# Patient Record
Sex: Male | Born: 1965 | Race: White | Hispanic: No | Marital: Married | Smoking: Current every day smoker
Health system: Southern US, Community
[De-identification: ages and names within clinical notes are randomized; demographics above are authoritative.]

## PROBLEM LIST (undated history)

## (undated) DIAGNOSIS — F1010 Alcohol abuse, uncomplicated: Secondary | ICD-10-CM

## (undated) DIAGNOSIS — Z9189 Other specified personal risk factors, not elsewhere classified: Secondary | ICD-10-CM

## (undated) DIAGNOSIS — R0600 Dyspnea, unspecified: Secondary | ICD-10-CM

## (undated) DIAGNOSIS — Z720 Tobacco use: Secondary | ICD-10-CM

## (undated) DIAGNOSIS — F32A Depression, unspecified: Secondary | ICD-10-CM

## (undated) DIAGNOSIS — R569 Unspecified convulsions: Secondary | ICD-10-CM

## (undated) DIAGNOSIS — K219 Gastro-esophageal reflux disease without esophagitis: Secondary | ICD-10-CM

## (undated) DIAGNOSIS — F411 Generalized anxiety disorder: Secondary | ICD-10-CM

## (undated) DIAGNOSIS — M1990 Unspecified osteoarthritis, unspecified site: Secondary | ICD-10-CM

## (undated) DIAGNOSIS — I10 Essential (primary) hypertension: Secondary | ICD-10-CM

## (undated) DIAGNOSIS — M75102 Unspecified rotator cuff tear or rupture of left shoulder, not specified as traumatic: Secondary | ICD-10-CM

## (undated) DIAGNOSIS — F1910 Other psychoactive substance abuse, uncomplicated: Secondary | ICD-10-CM

## (undated) DIAGNOSIS — D649 Anemia, unspecified: Secondary | ICD-10-CM

## (undated) DIAGNOSIS — K5792 Diverticulitis of intestine, part unspecified, without perforation or abscess without bleeding: Secondary | ICD-10-CM

## (undated) DIAGNOSIS — E7800 Pure hypercholesterolemia, unspecified: Secondary | ICD-10-CM

## (undated) DIAGNOSIS — K5720 Diverticulitis of large intestine with perforation and abscess without bleeding: Secondary | ICD-10-CM

## (undated) DIAGNOSIS — G4730 Sleep apnea, unspecified: Secondary | ICD-10-CM

## (undated) DIAGNOSIS — T7840XA Allergy, unspecified, initial encounter: Secondary | ICD-10-CM

## (undated) DIAGNOSIS — S43432A Superior glenoid labrum lesion of left shoulder, initial encounter: Secondary | ICD-10-CM

## (undated) HISTORY — DX: Allergy, unspecified, initial encounter: T78.40XA

## (undated) HISTORY — DX: Essential (primary) hypertension: I10

## (undated) HISTORY — DX: Generalized anxiety disorder: F41.1

## (undated) HISTORY — DX: Other psychoactive substance abuse, uncomplicated: F19.10

## (undated) HISTORY — DX: Unspecified convulsions: R56.9

## (undated) HISTORY — DX: Depression, unspecified: F32.A

## (undated) HISTORY — PX: ROTATOR CUFF REPAIR: SHX139

## (undated) HISTORY — PX: HEMORRHOID SURGERY: SHX153

## (undated) HISTORY — DX: Unspecified osteoarthritis, unspecified site: M19.90

## (undated) HISTORY — PX: HERNIA REPAIR: SHX51

## (undated) HISTORY — PX: BACK SURGERY: SHX140

---

## 2002-02-21 ENCOUNTER — Other Ambulatory Visit: Admission: RE | Admit: 2002-02-21 | Discharge: 2002-02-21 | Payer: Self-pay | Admitting: Dermatology

## 2002-07-19 ENCOUNTER — Encounter: Payer: Self-pay | Admitting: Preventative Medicine

## 2002-07-19 ENCOUNTER — Ambulatory Visit (HOSPITAL_COMMUNITY): Admission: RE | Admit: 2002-07-19 | Discharge: 2002-07-19 | Payer: Self-pay | Admitting: Preventative Medicine

## 2002-09-05 ENCOUNTER — Ambulatory Visit (HOSPITAL_BASED_OUTPATIENT_CLINIC_OR_DEPARTMENT_OTHER): Admission: RE | Admit: 2002-09-05 | Discharge: 2002-09-05 | Payer: Self-pay | Admitting: Orthopedic Surgery

## 2003-07-10 ENCOUNTER — Encounter: Payer: Self-pay | Admitting: General Surgery

## 2003-07-11 ENCOUNTER — Ambulatory Visit (HOSPITAL_COMMUNITY): Admission: RE | Admit: 2003-07-11 | Discharge: 2003-07-11 | Payer: Self-pay | Admitting: General Surgery

## 2003-07-11 ENCOUNTER — Encounter (INDEPENDENT_AMBULATORY_CARE_PROVIDER_SITE_OTHER): Payer: Self-pay

## 2004-01-29 ENCOUNTER — Ambulatory Visit (HOSPITAL_COMMUNITY): Admission: RE | Admit: 2004-01-29 | Discharge: 2004-01-29 | Payer: Self-pay | Admitting: Pulmonary Disease

## 2004-03-13 ENCOUNTER — Encounter (HOSPITAL_COMMUNITY): Admission: RE | Admit: 2004-03-13 | Discharge: 2004-04-12 | Payer: Self-pay | Admitting: Neurosurgery

## 2006-12-25 ENCOUNTER — Ambulatory Visit: Payer: Self-pay | Admitting: Internal Medicine

## 2007-07-26 ENCOUNTER — Encounter: Payer: Self-pay | Admitting: Internal Medicine

## 2007-12-03 ENCOUNTER — Ambulatory Visit (HOSPITAL_COMMUNITY): Admission: RE | Admit: 2007-12-03 | Discharge: 2007-12-03 | Payer: Self-pay | Admitting: Pulmonary Disease

## 2008-08-16 ENCOUNTER — Ambulatory Visit (HOSPITAL_COMMUNITY): Admission: RE | Admit: 2008-08-16 | Discharge: 2008-08-16 | Payer: Self-pay | Admitting: Family Medicine

## 2008-11-09 ENCOUNTER — Ambulatory Visit (HOSPITAL_COMMUNITY): Admission: RE | Admit: 2008-11-09 | Discharge: 2008-11-09 | Payer: Self-pay | Admitting: Family Medicine

## 2009-03-07 ENCOUNTER — Encounter: Admission: RE | Admit: 2009-03-07 | Discharge: 2009-03-07 | Payer: Self-pay | Admitting: Occupational Medicine

## 2009-05-25 DIAGNOSIS — F411 Generalized anxiety disorder: Secondary | ICD-10-CM | POA: Insufficient documentation

## 2009-05-25 DIAGNOSIS — I10 Essential (primary) hypertension: Secondary | ICD-10-CM | POA: Insufficient documentation

## 2009-05-25 DIAGNOSIS — M1990 Unspecified osteoarthritis, unspecified site: Secondary | ICD-10-CM | POA: Insufficient documentation

## 2011-04-11 NOTE — Op Note (Signed)
   NAME:  [name], [name]                       ACCOUNT NO.:  [id number]   MEDICAL RECORD NO.:  [id number]                   PATIENT TYPE:  AMB   LOCATION:  DAY                                  FACILITY:  [facility]   PHYSICIAN:  Timothy E. Earlene Plater, M.D.              DATE OF BIRTH:  14-Jul-1966   DATE OF PROCEDURE:  07/11/2003  DATE OF DISCHARGE:                                 OPERATIVE REPORT   PREOPERATIVE DIAGNOSIS:  Internal and external hemorrhoids.   POSTOPERATIVE DIAGNOSIS:  Internal and external hemorrhoids.   PROCEDURE:  Hemorrhoidectomy, simple.   SURGEON:  Timothy E. Earlene Plater, M.D.   ANESTHESIA:  General.   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 Because of the pain,  discomfort, soilage and bleeding, he wishes to have surgical repair. It has  been carefully discussed with him in the office. Otherwise he is well and  has no contraindications to surgery. He was identified and a permit signed.   DESCRIPTION OF PROCEDURE:  He was taken to the operating room, placed  supine, LMA anesthesia provided. He was placed in lithotomy, perianal area  inspected, prepped and draped in the usual fashion. A large left posterior  external tag was present otherwise the external orifice and sphincters were  normal. The anus was injected around and about with 0.25% Marcaine with  epinephrine mixed 9:1 with Wydase. This was massaged in well. Anoscopy  carried out, a prominent left lateral and right posterior internal  hemorrhoid were present but only third degree and did not prolapse. I  excised the external tag and closed that wound with a running 4-0 Chromic. I  then via anoscopy band ligated a right posterior and a left lateral internal  hemorrhoid. Otherwise the anatomy was good and there were no indications for  further surgery. The procedure was complete, Gelfoam gauze and a dry sterile  dressing  applied. Counts correct. He tolerated it well and was removed to  the recovery room in good condition.   Written and verbal instructions were given including Percocet and he will be  seen and followed as an outpatient.                                               Timothy E. Earlene Plater, M.D.    TED/MEDQ  D:  07/11/2003  T:  07/11/2003  Job:  [id number]   cc:   Ramon Dredge L. Juanetta Gosling, M.D.  [address]  [place]  [place] [address]  Fax: [phone]
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
## 2011-04-11 NOTE — Assessment & Plan Note (Signed)
[facility]                       [place] CARDIOLOGY OFFICE NOTE   MARION, ROSENBERRY                    MRN:          [id number]  DATE:12/25/2006                            DOB:          Dec 27, 1965    [name] is a 45 year old gentleman who was referred for evaluation  of chest pressure.   HISTORY OF PRESENT ILLNESS:  The patient said for the past 2 years  actually he has had some tightness in his chest.  He points to  substernal region.  It lasts about 15 minutes, usually occurs when he is  driving.  Not associated with shortness of breath, no radiation.  Since  it began 2 years ago he says it comes on about every 2-3 days.   When he thinks about it, it does not occur with other activities, does  not wake him up at night, occurs more during the week than on the  weekend.   He is active at work, notes no problems with this, does not slow down  because he gives out, no chest pressure, takes stairs slow more because  of his back.   ALLERGIES:  None.   MEDICATIONS:  1. Atenolol 50 daily.  2. Benicar 20 daily.  3. Klonopin 5 daily.  4. Zoloft 50 daily.   PAST MEDICAL HISTORY:  1. Hypertension.  2. Anxiety.  3. Degenerative joint disease.   PAST SURGICAL HISTORY:  Hernia operation, rotator cuff surgery.   SOCIAL HISTORY:  The patient is married.  He recently began taking care  of a niece.  Smokes 1.5 packs per day, began smoking at age 86, does not  drink.   FAMILY HISTORY:  Father has coronary artery disease started in his 37s.  He is now 61.  Mother has coronary artery disease, question when  started.  Two siblings no CAD.  Maternal side of the family with CAD.   REVIEW OF SYSTEMS:  All systems reviewed.  Negative to the above problem  except as noted.   PHYSICAL EXAMINATION:  The patient is in no distress.  Blood pressure  138/82, pulse is 71, weight 187.  HEENT:  Normocephalic and atraumatic.  EOMI.  PERRL.  Throat  clear.  Nose clear.  NECK:  JVP is normal, no bruits, no thyromegaly.  LUNGS:  Clear, no rales or wheezes.  CARDIAC:  Regular rate and rhythm, S1, S2, no S3, S4 or murmurs.  CHEST:  Nontender.  ABDOMEN:  Benign.  No hepatomegaly, no masses.  No bruits.  EXTREMITIES:  Good distal pulses, no lower extremity edema.   Twelve-lead EKG:  Normal sinus rhythm, 71 BPM, nonspecific ST changes.   IMPRESSION:  [name] is a 45 year old gentleman with chest  pressure.  His risk factors include hypertension which is being treated,  family history of CAD, and also tobacco.   LABORATORY DATA:  He had lipids done just recently that actually were  quite good.  His LDL was 79, HDL 48.  His triglycerides  are 299, says  he drinks Gatorade and sodas.   I am not sure what this chest pressure represents.  There may be  some  increased stress, question of subclinical reflux.  I have given him  prescription for omeprazole to begin b.i.d. and he should really target  when he gets the spells.  I would like to see him back in a few weeks.   Again, if he has a severe spell he should call 911 and chew aspirin, 4  baby.   Otherwise, continue activities as tolerated.     Pricilla Riffle, MD, [facility]  Electronically Signed    PVR/MedQ  DD: 12/25/2006  DT: 12/25/2006  Job #: [id number]

## 2011-05-29 ENCOUNTER — Encounter: Payer: Self-pay | Admitting: Family Medicine

## 2011-05-29 DIAGNOSIS — T7840XA Allergy, unspecified, initial encounter: Secondary | ICD-10-CM | POA: Insufficient documentation

## 2011-05-29 DIAGNOSIS — I10 Essential (primary) hypertension: Secondary | ICD-10-CM | POA: Insufficient documentation

## 2012-12-14 ENCOUNTER — Ambulatory Visit (INDEPENDENT_AMBULATORY_CARE_PROVIDER_SITE_OTHER): Payer: PRIVATE HEALTH INSURANCE | Admitting: Urology

## 2012-12-14 DIAGNOSIS — N529 Male erectile dysfunction, unspecified: Secondary | ICD-10-CM

## 2012-12-14 DIAGNOSIS — Z3009 Encounter for other general counseling and advice on contraception: Secondary | ICD-10-CM

## 2013-01-04 ENCOUNTER — Encounter (INDEPENDENT_AMBULATORY_CARE_PROVIDER_SITE_OTHER): Payer: PRIVATE HEALTH INSURANCE | Admitting: Urology

## 2013-01-04 DIAGNOSIS — Z302 Encounter for sterilization: Secondary | ICD-10-CM

## 2013-01-11 ENCOUNTER — Ambulatory Visit (INDEPENDENT_AMBULATORY_CARE_PROVIDER_SITE_OTHER): Payer: PRIVATE HEALTH INSURANCE | Admitting: Urology

## 2013-01-11 DIAGNOSIS — Z09 Encounter for follow-up examination after completed treatment for conditions other than malignant neoplasm: Secondary | ICD-10-CM

## 2013-01-11 DIAGNOSIS — Z302 Encounter for sterilization: Secondary | ICD-10-CM

## 2013-02-11 ENCOUNTER — Emergency Department (HOSPITAL_COMMUNITY): Payer: PRIVATE HEALTH INSURANCE

## 2013-02-11 ENCOUNTER — Encounter (HOSPITAL_COMMUNITY): Payer: Self-pay | Admitting: Emergency Medicine

## 2013-02-11 ENCOUNTER — Observation Stay (HOSPITAL_COMMUNITY)
Admission: EM | Admit: 2013-02-11 | Discharge: 2013-02-13 | Disposition: A | Discharge status: Home / Self Care | Payer: PRIVATE HEALTH INSURANCE | Attending: Internal Medicine | Admitting: Internal Medicine

## 2013-02-11 DIAGNOSIS — E785 Hyperlipidemia, unspecified: Secondary | ICD-10-CM | POA: Diagnosis present

## 2013-02-11 DIAGNOSIS — Z720 Tobacco use: Secondary | ICD-10-CM | POA: Diagnosis present

## 2013-02-11 DIAGNOSIS — R0789 Other chest pain: Principal | ICD-10-CM | POA: Diagnosis present

## 2013-02-11 DIAGNOSIS — I519 Heart disease, unspecified: Secondary | ICD-10-CM | POA: Insufficient documentation

## 2013-02-11 DIAGNOSIS — R079 Chest pain, unspecified: Secondary | ICD-10-CM

## 2013-02-11 DIAGNOSIS — F411 Generalized anxiety disorder: Secondary | ICD-10-CM

## 2013-02-11 DIAGNOSIS — I10 Essential (primary) hypertension: Secondary | ICD-10-CM

## 2013-02-11 DIAGNOSIS — F17200 Nicotine dependence, unspecified, uncomplicated: Secondary | ICD-10-CM | POA: Insufficient documentation

## 2013-02-11 DIAGNOSIS — R61 Generalized hyperhidrosis: Secondary | ICD-10-CM | POA: Insufficient documentation

## 2013-02-11 DIAGNOSIS — Z8249 Family history of ischemic heart disease and other diseases of the circulatory system: Secondary | ICD-10-CM | POA: Insufficient documentation

## 2013-02-11 DIAGNOSIS — K219 Gastro-esophageal reflux disease without esophagitis: Secondary | ICD-10-CM | POA: Insufficient documentation

## 2013-02-11 LAB — BASIC METABOLIC PANEL
CO2: 25 mEq/L (ref 19–32)
Chloride: 100 mEq/L (ref 96–112)
Sodium: 137 mEq/L (ref 135–145)

## 2013-02-11 LAB — CBC
HCT: 48.3 % (ref 39.0–52.0)
Hemoglobin: 16.7 g/dL (ref 13.0–17.0)
MCH: 32.9 pg (ref 26.0–34.0)
MCHC: 34.6 g/dL (ref 30.0–36.0)

## 2013-02-11 LAB — CBC WITH DIFFERENTIAL/PLATELET
Basophils Absolute: 0.1 10*3/uL (ref 0.0–0.1)
HCT: 49.3 % (ref 39.0–52.0)
Lymphocytes Relative: 29 % (ref 12–46)
Lymphs Abs: 3.5 10*3/uL (ref 0.7–4.0)
Neutro Abs: 6.8 10*3/uL (ref 1.7–7.7)
Platelets: 292 10*3/uL (ref 150–400)
RBC: 5.19 MIL/uL (ref 4.22–5.81)
RDW: 12.8 % (ref 11.5–15.5)
WBC: 11.8 10*3/uL — ABNORMAL HIGH (ref 4.0–10.5)

## 2013-02-11 LAB — POCT I-STAT TROPONIN I: Troponin i, poc: 0 ng/mL (ref 0.00–0.08)

## 2013-02-11 LAB — CREATININE, SERUM: GFR calc non Af Amer: 59 mL/min — ABNORMAL LOW (ref >90)

## 2013-02-11 MED ORDER — AZITHROMYCIN 250 MG PO TABS
250.0000 mg | ORAL_TABLET | Freq: Once | ORAL | Status: AC
  Administered 2013-02-12: 250 mg via ORAL
  Filled 2013-02-11: qty 1

## 2013-02-11 MED ORDER — CLONAZEPAM 0.5 MG PO TABS
0.5000 mg | ORAL_TABLET | Freq: Every day | ORAL | Status: DC
  Administered 2013-02-12 – 2013-02-13 (×2): 0.5 mg via ORAL
  Filled 2013-02-11 (×2): qty 1

## 2013-02-11 MED ORDER — ONDANSETRON HCL 4 MG PO TABS: 4.0000 mg | ORAL_TABLET | Freq: Four times a day (QID) | ORAL | Status: DC | PRN

## 2013-02-11 MED ORDER — SIMVASTATIN 40 MG PO TABS
40.0000 mg | ORAL_TABLET | Freq: Every day | ORAL | Status: DC
  Administered 2013-02-12: 40 mg via ORAL
  Filled 2013-02-11 (×2): qty 1

## 2013-02-11 MED ORDER — VENLAFAXINE HCL ER 150 MG PO CP24
150.0000 mg | ORAL_CAPSULE | Freq: Every morning | ORAL | Status: DC
  Administered 2013-02-12 – 2013-02-13 (×2): 150 mg via ORAL
  Filled 2013-02-11 (×2): qty 1

## 2013-02-11 MED ORDER — ATENOLOL 50 MG PO TABS
50.0000 mg | ORAL_TABLET | Freq: Every morning | ORAL | Status: DC
  Administered 2013-02-12 – 2013-02-13 (×2): 50 mg via ORAL
  Filled 2013-02-11 (×2): qty 1

## 2013-02-11 MED ORDER — IRBESARTAN 75 MG PO TABS
75.0000 mg | ORAL_TABLET | Freq: Every day | ORAL | Status: DC
  Administered 2013-02-12 – 2013-02-13 (×2): 75 mg via ORAL
  Filled 2013-02-11 (×2): qty 1

## 2013-02-11 MED ORDER — ONDANSETRON HCL 4 MG/2ML IJ SOLN: 4.0000 mg | Freq: Four times a day (QID) | INTRAMUSCULAR | Status: DC | PRN

## 2013-02-11 MED ORDER — ENOXAPARIN SODIUM 40 MG/0.4ML ~~LOC~~ SOLN
40.0000 mg | SUBCUTANEOUS | Status: DC
  Filled 2013-02-11 (×3): qty 0.4

## 2013-02-11 MED ORDER — ASPIRIN 81 MG PO CHEW
324.0000 mg | CHEWABLE_TABLET | Freq: Once | ORAL | Status: AC
  Administered 2013-02-11: 324 mg via ORAL
  Filled 2013-02-11: qty 4

## 2013-02-11 MED ORDER — SODIUM CHLORIDE 0.9 % IJ SOLN
3.0000 mL | Freq: Two times a day (BID) | INTRAMUSCULAR | Status: DC
  Administered 2013-02-11 – 2013-02-12 (×3): 3 mL via INTRAVENOUS

## 2013-02-11 NOTE — ED Provider Notes (Signed)
History     CSN: [id number]  Arrival date & time 02/11/13  1255   First MD Initiated Contact with Patient 02/11/13 1635      Chief Complaint  Patient presents with  . Chest Pain    (Consider location/radiation/quality/duration/timing/severity/associated sxs/prior treatment) HPI Comments: Patient presents with a two-day history of intermittent chest tightness. He describes as a pressure feeling to the left side of his chest. It's been going off and on since yesterday. It's nonexertional. He has some radiation to his left shoulder and jaw at times. He did get diaphoretic and nauseated with the pain it first started. He has no cough or chest congestion. He has no leg pain or swelling. No history of heart problems in the past. He does have a history of hypertension and hyperlipidemia and he is a smoker. He has a family history of heart disease mostly in later years. Does have an aunt that had early heart disease in her 28s. His last stress test was several years ago.   Past Medical History  Diagnosis Date  . Allergy   . Hypertension     Past Surgical History  Procedure Laterality Date  . Rotator cuff repair    . Hernia repair    . Hemorrhoid surgery      No family history on file.  History  Substance Use Topics  . Smoking status: Current Every Day Smoker  . Smokeless tobacco: Not on file  . Alcohol Use: Yes      Review of Systems  Constitutional: Positive for diaphoresis. Negative for fever, chills and fatigue.  HENT: Negative for congestion, rhinorrhea and sneezing.   Eyes: Negative.   Respiratory: Negative for cough, chest tightness and shortness of breath.   Cardiovascular: Positive for chest pain. Negative for leg swelling.  Gastrointestinal: Positive for nausea. Negative for vomiting, abdominal pain, diarrhea and blood in stool.  Genitourinary: Negative for frequency, hematuria, flank pain and difficulty urinating.  Musculoskeletal: Negative for back pain and  arthralgias.  Skin: Negative for rash.  Neurological: Negative for dizziness, speech difficulty, weakness, numbness and headaches.    Allergies  Review of patient's allergies indicates no known allergies.  Home Medications   Current Outpatient Rx  Name  Route  Sig  Dispense  Refill  . Aspirin-Salicylamide-Caffeine (BC HEADACHE POWDER PO)   Oral   Take 1 packet by mouth daily as needed. For pain         . atenolol (TENORMIN) 50 MG tablet   Oral   Take 50 mg by mouth every morning.         . clonazePAM (KLONOPIN) 0.5 MG tablet   Oral   Take 0.5 mg by mouth daily.          Marland Kitchen NASAL SALINE NA   Nasal   Place 1 spray into the nose daily as needed. For nose bleeds         . olmesartan (BENICAR) 20 MG tablet   Oral   Take 20 mg by mouth every morning.          . simvastatin (ZOCOR) 40 MG tablet   Oral   Take 40 mg by mouth every morning.          . venlafaxine XR (EFFEXOR-XR) 150 MG 24 hr capsule   Oral   Take 150 mg by mouth every morning.           BP 121/78  Pulse 62  Temp(Src) 98.1 F (36.7 C) (Oral)  Resp 18  Ht 5\' 9"  (1.753 m)  Wt 188 lb (85.276 kg)  BMI 27.75 kg/m2  SpO2 98%  Physical Exam  Constitutional: He is oriented to person, place, and time. He appears well-developed and well-nourished.  HENT:  Head: Normocephalic and atraumatic.  Eyes: Pupils are equal, round, and reactive to light.  Neck: Normal range of motion. Neck supple.  Cardiovascular: Normal rate, regular rhythm and normal heart sounds.   Pulmonary/Chest: Effort normal and breath sounds normal. No respiratory distress. He has no wheezes. He has no rales. He exhibits no tenderness.  Abdominal: Soft. Bowel sounds are normal. There is no tenderness. There is no rebound and no guarding.  Musculoskeletal: Normal range of motion. He exhibits no edema.  Lymphadenopathy:    He has no cervical adenopathy.  Neurological: He is alert and oriented to person, place, and time.  Skin:  Skin is warm and dry. No rash noted.  Psychiatric: He has a normal mood and affect.    ED Course  Procedures (including critical care time)  Results for orders placed during the hospital encounter of 02/11/13  CBC WITH DIFFERENTIAL      Result Value Range   WBC 11.8 (*) 4.0 - 10.5 K/uL   RBC 5.19  4.22 - 5.81 MIL/uL   Hemoglobin 17.4 (*) 13.0 - 17.0 g/dL   HCT 16.1  09.6 - 04.5 %   MCV 95.0  78.0 - 100.0 fL   MCH 33.5  26.0 - 34.0 pg   MCHC 35.3  30.0 - 36.0 g/dL   RDW 40.9  81.1 - 91.4 %   Platelets 292  150 - 400 K/uL   Neutrophils Relative 58  43 - 77 %   Neutro Abs 6.8  1.7 - 7.7 K/uL   Lymphocytes Relative 29  12 - 46 %   Lymphs Abs 3.5  0.7 - 4.0 K/uL   Monocytes Relative 11  3 - 12 %   Monocytes Absolute 1.4 (*) 0.1 - 1.0 K/uL   Eosinophils Relative 1  0 - 5 %   Eosinophils Absolute 0.2  0.0 - 0.7 K/uL   Basophils Relative 0  0 - 1 %   Basophils Absolute 0.1  0.0 - 0.1 K/uL  BASIC METABOLIC PANEL      Result Value Range   Sodium 137  135 - 145 mEq/L   Potassium 4.6  3.5 - 5.1 mEq/L   Chloride 100  96 - 112 mEq/L   CO2 25  19 - 32 mEq/L   Glucose, Bld 115 (*) 70 - 99 mg/dL   BUN 13  6 - 23 mg/dL   Creatinine, Ser 7.82  0.50 - 1.35 mg/dL   Calcium 95.6  8.4 - 21.3 mg/dL   GFR calc non Af Amer 81 (*) >90 mL/min   GFR calc Af Amer >90  >90 mL/min  POCT I-STAT TROPONIN I      Result Value Range   Troponin i, poc 0.00  0.00 - 0.08 ng/mL   Comment 3            Dg Chest 2 View  02/11/2013  *RADIOLOGY REPORT*  Clinical Data: Chest pain.  CHEST - 2 VIEW  Comparison: No priors.  Findings: Lung volumes are normal.  No consolidative airspace disease.  No pleural effusions.  No pneumothorax.  No pulmonary nodule or mass noted.  Pulmonary vasculature and the cardiomediastinal silhouette are within normal limits.  IMPRESSION: 1. No radiographic evidence of acute cardiopulmonary disease.   Original Report Authenticated  By: Trudie Reed, M.D.      Dg Chest 2  View  02/11/2013  *RADIOLOGY REPORT*  Clinical Data: Chest pain.  CHEST - 2 VIEW  Comparison: No priors.  Findings: Lung volumes are normal.  No consolidative airspace disease.  No pleural effusions.  No pneumothorax.  No pulmonary nodule or mass noted.  Pulmonary vasculature and the cardiomediastinal silhouette are within normal limits.  IMPRESSION: 1. No radiographic evidence of acute cardiopulmonary disease.   Original Report Authenticated By: Trudie Reed, M.D.     Date: 02/11/2013  Rate: 78  Rhythm: normal sinus rhythm  QRS Axis: normal  Intervals: normal  ST/T Wave abnormalities: nonspecific ST/T changes  Conduction Disutrbances:none  Narrative Interpretation:   Old EKG Reviewed: unchanged    1. Chest pain       MDM  Patient is pain-free now. He was given aspirin. I discussed the case with the hospital service who will patient.        Rolan Bucco, MD 02/11/13 [phone]

## 2013-02-11 NOTE — ED Notes (Addendum)
Pt started zpak antibiotics Tuesday and has had epitaxis and dark stools since Saturday.

## 2013-02-11 NOTE — ED Notes (Addendum)
Onset of chest pain intermittent for years states seen Primary Doctor 4 days ago for sinus infections and given antibiotics. States one day ago chest pain returned with epistaxis and dark loose stool. Currently denies chest pain now.
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
## 2013-02-11 NOTE — ED Notes (Signed)
Hospitalist at bedside 

## 2013-02-12 ENCOUNTER — Observation Stay (HOSPITAL_COMMUNITY): Payer: PRIVATE HEALTH INSURANCE

## 2013-02-12 DIAGNOSIS — I517 Cardiomegaly: Secondary | ICD-10-CM

## 2013-02-12 DIAGNOSIS — F411 Generalized anxiety disorder: Secondary | ICD-10-CM

## 2013-02-12 LAB — CBC
HCT: 48.1 % (ref 39.0–52.0)
Hemoglobin: 16.6 g/dL (ref 13.0–17.0)
MCH: 33.4 pg (ref 26.0–34.0)
MCHC: 34.5 g/dL (ref 30.0–36.0)
MCV: 96.8 fL (ref 78.0–100.0)

## 2013-02-12 LAB — BASIC METABOLIC PANEL
BUN: 20 mg/dL (ref 6–23)
BUN: 20 mg/dL (ref 6–23)
CO2: 31 mEq/L (ref 19–32)
Calcium: 9.7 mg/dL (ref 8.4–10.5)
Creatinine, Ser: 0.98 mg/dL (ref 0.50–1.35)
GFR calc non Af Amer: 70 mL/min — ABNORMAL LOW (ref >90)
GFR calc non Af Amer: >90 mL/min (ref >90)
Glucose, Bld: 100 mg/dL — ABNORMAL HIGH (ref 70–99)
Glucose, Bld: 123 mg/dL — ABNORMAL HIGH (ref 70–99)
Potassium: 5.6 mEq/L — ABNORMAL HIGH (ref 3.5–5.1)
Sodium: 138 mEq/L (ref 135–145)

## 2013-02-12 LAB — URINALYSIS, ROUTINE W REFLEX MICROSCOPIC
Bilirubin Urine: NEGATIVE
Ketones, ur: NEGATIVE mg/dL
Nitrite: NEGATIVE
pH: 5 (ref 5.0–8.0)

## 2013-02-12 LAB — TROPONIN I
Troponin I: <0.3 ng/mL (ref <0.30)
Troponin I: <0.3 ng/mL (ref <0.30)

## 2013-02-12 LAB — HEMOGLOBIN A1C
Hgb A1c MFr Bld: 5.9 % — ABNORMAL HIGH (ref <5.7)
Mean Plasma Glucose: 123 mg/dL — ABNORMAL HIGH (ref <117)

## 2013-02-12 MED ORDER — ACETAMINOPHEN 325 MG PO TABS
650.0000 mg | ORAL_TABLET | ORAL | Status: DC | PRN
  Administered 2013-02-12: 650 mg via ORAL
  Filled 2013-02-12: qty 2

## 2013-02-12 MED ORDER — NICOTINE 21 MG/24HR TD PT24
21.0000 mg | MEDICATED_PATCH | Freq: Every day | TRANSDERMAL | Status: DC
  Administered 2013-02-12 – 2013-02-13 (×2): 21 mg via TRANSDERMAL
  Filled 2013-02-12 (×2): qty 1

## 2013-02-12 MED ORDER — SODIUM POLYSTYRENE SULFONATE 15 GM/60ML PO SUSP
15.0000 g | Freq: Once | ORAL | Status: AC
  Administered 2013-02-12: 15 g via ORAL
  Filled 2013-02-12: qty 60

## 2013-02-12 MED ORDER — TRAMADOL HCL 50 MG PO TABS
50.0000 mg | ORAL_TABLET | Freq: Four times a day (QID) | ORAL | Status: DC | PRN
  Administered 2013-02-12: 50 mg via ORAL
  Filled 2013-02-12: qty 1

## 2013-02-12 NOTE — Progress Notes (Signed)
  Echocardiogram 2D Echocardiogram has been performed.  [name] 02/12/2013, 9:30 AM

## 2013-02-12 NOTE — Progress Notes (Signed)
UR Completed Denisa Enterline Graves-Bigelow, RN,BSN [phone]  

## 2013-02-12 NOTE — Progress Notes (Signed)
[facility] PROGRESS NOTE  RHONIN TROTT ZOX:[id number] DOB: 10-20-1966 DOA: 02/11/2013 PCP: Leo Grosser, MD  Assessment/Plan: 1. Chest pain: admitt to tele to rule out ACS.  Cardiac enzymes negative Echo, and if negative, stress test in am.  Start on aspirin and b blockers.  2. Hypertension: controlled resume home medications.  3. Hyperlipidemia: obtain lipid panel in am  DVT PROPHYLAXIS.    HPI/Subjective: No chest pain, sob,  Objective: Filed Vitals:   02/12/13 0938 02/12/13 1311 02/12/13 1443 02/12/13 1500  BP: 135/83 127/85 135/82 126/80  Pulse: 62 69 64 60  Temp:   97.8 F (36.6 C)   TempSrc:   Oral   Resp:   18 16  Height:      Weight:      SpO2:   98%     Intake/Output Summary (Last 24 hours) at 02/12/13 1858 Last data filed at 02/12/13 0549  Gross per 24 hour  Intake      0 ml  Output    200 ml  Net   -200 ml   Filed Weights   02/11/13 1308 02/12/13 0541  Weight: 85.276 kg (188 lb) 85.004 kg (187 lb 6.4 oz)    Exam:   General:  Alert afebrile comfortable  Cardiovascular: s1s2 RRR  Respiratory: CTAB  Abdomen: soft NT ND BS+  Musculoskeletal: no pedal edema  Data Reviewed: Basic Metabolic Panel:  Recent Labs Lab 02/11/13 1311 02/11/13 1914 02/12/13 0454 02/12/13 1025  NA 137  --  139 138  K 4.6  --  5.6* 4.1  CL 100  --  99 100  CO2 25  --  31 28  GLUCOSE 115*  --  100* 123*  BUN 13  --  20 20  CREATININE 1.06 1.39* 1.20 0.98  CALCIUM 10.0  --  9.8 9.7   Liver Function Tests: No results found for this basename: AST, ALT, ALKPHOS, BILITOT, PROT, ALBUMIN,  in the last 168 hours No results found for this basename: LIPASE, AMYLASE,  in the last 168 hours No results found for this basename: AMMONIA,  in the last 168 hours CBC:  Recent Labs Lab 02/11/13 1311 02/11/13 1914 02/12/13 0454  WBC 11.8* 12.8* 10.6*  NEUTROABS 6.8  --   --   HGB 17.4* 16.7 16.6  HCT 49.3 48.3 48.1  MCV 95.0 95.3 96.8  PLT 292 269 250    Cardiac Enzymes:  Recent Labs Lab 02/11/13 1913 02/12/13 0045 02/12/13 0454  TROPONINI <0.30 <0.30 <0.30   BNP (last 3 results)  Recent Labs  02/11/13 1913  PROBNP 22.8   CBG: No results found for this basename: GLUCAP,  in the last 168 hours  No results found for this or any previous visit (from the past 240 hour(s)).   Studies: Dg Chest 2 View  02/11/2013  *RADIOLOGY REPORT*  Clinical Data: Chest pain.  CHEST - 2 VIEW  Comparison: No priors.  Findings: Lung volumes are normal.  No consolidative airspace disease.  No pleural effusions.  No pneumothorax.  No pulmonary nodule or mass noted.  Pulmonary vasculature and the cardiomediastinal silhouette are within normal limits.  IMPRESSION: 1. No radiographic evidence of acute cardiopulmonary disease.   Original Report Authenticated By: Trudie Reed, M.D.     Scheduled Meds: . atenolol  50 mg Oral q morning - 10a  . clonazePAM  0.5 mg Oral Daily  . enoxaparin (LOVENOX) injection  40 mg Subcutaneous Q24H  . irbesartan  75 mg Oral Daily  .  nicotine  21 mg Transdermal Daily  . simvastatin  40 mg Oral q1800  . sodium chloride  3 mL Intravenous Q12H  . venlafaxine XR  150 mg Oral q morning - 10a   Continuous Infusions:   Active Problems:   * No active hospital problems. Kathlen Mody  [facility] Pager [phone] If 7PM-7AM, please contact night-coverage at [email or web address], password [facility] 02/12/2013, 6:58 PM  LOS: 1 day

## 2013-02-13 DIAGNOSIS — R0789 Other chest pain: Secondary | ICD-10-CM | POA: Diagnosis present

## 2013-02-13 DIAGNOSIS — Z720 Tobacco use: Secondary | ICD-10-CM | POA: Diagnosis present

## 2013-02-13 DIAGNOSIS — E785 Hyperlipidemia, unspecified: Secondary | ICD-10-CM | POA: Diagnosis present

## 2013-02-13 DIAGNOSIS — F17200 Nicotine dependence, unspecified, uncomplicated: Secondary | ICD-10-CM

## 2013-02-13 DIAGNOSIS — R079 Chest pain, unspecified: Secondary | ICD-10-CM

## 2013-02-13 MED ORDER — TECHNETIUM TC 99M SESTAMIBI GENERIC - CARDIOLITE
10.0000 | Freq: Once | INTRAVENOUS | Status: AC | PRN
  Administered 2013-02-12: 10 via INTRAVENOUS

## 2013-02-13 MED ORDER — REGADENOSON 0.4 MG/5ML IV SOLN: 0.4000 mg | Freq: Once | INTRAVENOUS | Status: AC

## 2013-02-13 MED ORDER — REGADENOSON 0.4 MG/5ML IV SOLN
INTRAVENOUS | Status: AC
  Administered 2013-02-13: 0.4 mg via INTRAVENOUS
  Filled 2013-02-13: qty 5

## 2013-02-13 MED ORDER — TECHNETIUM TC 99M SESTAMIBI GENERIC - CARDIOLITE
30.0000 | Freq: Once | INTRAVENOUS | Status: AC | PRN
  Administered 2013-02-13 (×2): 30 via INTRAVENOUS

## 2013-02-13 MED ORDER — NICOTINE 21 MG/24HR TD PT24: 1.0000 | MEDICATED_PATCH | Freq: Every day | TRANSDERMAL | Status: DC

## 2013-02-13 MED ORDER — PANTOPRAZOLE SODIUM 40 MG PO TBEC: 40.0000 mg | DELAYED_RELEASE_TABLET | Freq: Every day | ORAL | Status: DC

## 2013-02-13 NOTE — Discharge Summary (Addendum)
Physician Discharge Summary  [name]:[id number] DOB: [birth year] DOA: 02/11/2013  PCP: Leo Grosser, MD  Admit date: 02/11/2013 Discharge date: 02/13/2013    Recommendations for Outpatient Follow-up:  1. Follow up with PCP in one week 2. Possible referral for GI to evaluate for pud.   Discharge Diagnoses:  Atypical chest pain gerd Tobacco abuse Hypertension Hyperlipidemia    Discharge Condition: fair.   Diet recommendation: low sodium diet  Filed Weights   02/11/13 1308 02/12/13 0541  Weight: 85.276 kg (188 lb) 85.004 kg (187 lb 6.4 oz)    History of present illness:  [name] is a 47 y.o. male With h/o hypertension, hyperlipidemia, came infor chest pain on the left side radiating to the jaw, occurred at rest resolved spontaneously. Associated with diaphoresis and nausea. No other complaints. He will be admitted to medical service for evaluation of ACS.    Hospital Course:  1. NON CARDIAC Chest pain: Probably from gerd / anxiety attacks.  admitted to tele to rule out ACS.  Cardiac enzymes negative , ekg normal sinus rhythm, echo showed normal EF. Stress test negative for inducible ischemia. Started him on protonix empirically for GERD.   2. Hypertension: controlled resume home medications.  3. Hyperlipidemia: RESUME zocor.  4. Tobacco abuse; nicotine patch ordered. Counseling provided. But patient and wife not ready to quit smoking yet.   Procedures: lexiscan Negative for inducible ischemia. Normal EF.  Discharge Exam: Filed Vitals:   02/13/13 9562 02/13/13 1000 02/13/13 1002 02/13/13 1004  BP: 117/74 141/87 144/83 131/82  Pulse:      Temp:      TempSrc:      Resp:      Height:      Weight:      SpO2:       General: Alert afebrile comfortable  Cardiovascular: s1s2 RRR  Respiratory: CTAB  Abdomen: soft NT ND BS+  Musculoskeletal: no pedal edema   Discharge Instructions  Discharge Orders   Future Orders Complete By Expires     Diet - low sodium heart healthy  As directed     Discharge instructions  As directed     Comments:      Follow up with PCP in 1 week.        Medication List    TAKE these medications       atenolol 50 MG tablet  Commonly known as:  TENORMIN  Take 50 mg by mouth every morning.     BC HEADACHE POWDER PO  Take 1 packet by mouth daily as needed. For pain     KLONOPIN 0.5 MG tablet  Generic drug:  clonazePAM  Take 0.5 mg by mouth daily.     NASAL SALINE NA  Place 1 spray into the nose daily as needed. For nose bleeds     nicotine 21 mg/24hr patch  Commonly known as:  NICODERM CQ - dosed in mg/24 hours  Place 1 patch onto the skin daily.     olmesartan 20 MG tablet  Commonly known as:  BENICAR  Take 20 mg by mouth every morning.     pantoprazole 40 MG tablet  Commonly known as:  PROTONIX  Take 1 tablet (40 mg total) by mouth daily.     simvastatin 40 MG tablet  Commonly known as:  ZOCOR  Take 40 mg by mouth every morning.     venlafaxine XR 150 MG 24 hr capsule  Commonly known as:  EFFEXOR-XR  Take 150 mg  by mouth every morning.          The results of significant diagnostics from this hospitalization (including imaging, microbiology, ancillary and laboratory) are listed below for reference.    Significant Diagnostic Studies: Dg Chest 2 View  02/11/2013  *RADIOLOGY REPORT*  Clinical Data: Chest pain.  CHEST - 2 VIEW  Comparison: No priors.  Findings: Lung volumes are normal.  No consolidative airspace disease.  No pleural effusions.  No pneumothorax.  No pulmonary nodule or mass noted.  Pulmonary vasculature and the cardiomediastinal silhouette are within normal limits.  IMPRESSION: 1. No radiographic evidence of acute cardiopulmonary disease.   Original Report Authenticated By: Trudie Reed, M.D.    Nm Myocar Multi W/spect W/wall Motion / Ef  02/13/2013  *RADIOLOGY REPORT*  Clinical Data:  47 year old male with chest pain  MYOCARDIAL IMAGING WITH SPECT (REST AND  PHARMACOLOGIC-STRESS) GATED LEFT VENTRICULAR WALL MOTION STUDY LEFT VENTRICULAR EJECTION FRACTION  Technique:  Resting myocardial SPECT imaging was initially performed after intravenous administration of radiopharmaceutical. Myocardial SPECT was subsequently performed after additional radiopharmaceutical injection during pharmacologic-stress supervised by the Cardiology staff.  Quantitative gated imaging was also performed to evaluate left ventricular wall motion, and estimate left ventricular ejection fraction.  Radiopharmaceutical:  Tc-33m Cardiolite at rest and during stress.  Comparison: none  Findings:  Technique: Study is adequate.  Perfusion:  There are no relative decreased counts on stress or rest to suggest reversible ischemia or infarction.  Wall motion:  No focal wall motion abnormality.  Normal contractility.  Left ventricular ejection fraction:  Calculated left ventricular ejection fraction = 62%.  IMPRESSION:  1.  No reversible ischemia or infarction. 2.  Normal wall motion. 3.  Left ventricular ejection fraction equal 62%   Original Report Authenticated By: Genevive Bi, M.D.     Microbiology: No results found for this or any previous visit (from the past 240 hour(s)).   Labs: Basic Metabolic Panel:  Recent Labs Lab 02/11/13 1311 02/11/13 1914 02/12/13 0454 02/12/13 1025  NA 137  --  139 138  K 4.6  --  5.6* 4.1  CL 100  --  99 100  CO2 25  --  31 28  GLUCOSE 115*  --  100* 123*  BUN 13  --  20 20  CREATININE 1.06 1.39* 1.20 0.98  CALCIUM 10.0  --  9.8 9.7   Liver Function Tests: No results found for this basename: AST, ALT, ALKPHOS, BILITOT, PROT, ALBUMIN,  in the last 168 hours No results found for this basename: LIPASE, AMYLASE,  in the last 168 hours No results found for this basename: AMMONIA,  in the last 168 hours CBC:  Recent Labs Lab 02/11/13 1311 02/11/13 1914 02/12/13 0454  WBC 11.8* 12.8* 10.6*  NEUTROABS 6.8  --   --   HGB 17.4* 16.7  16.6  HCT 49.3 48.3 48.1  MCV 95.0 95.3 96.8  PLT 292 269 250   Cardiac Enzymes:  Recent Labs Lab 02/11/13 1913 02/12/13 0045 02/12/13 0454  TROPONINI <0.30 <0.30 <0.30   BNP: BNP (last 3 results)  Recent Labs  02/11/13 1913  PROBNP 22.8   CBG: No results found for this basename: GLUCAP,  in the last 168 hours     Signed:  Jaritza Duignan  [facility] 02/13/2013, 11:55 AM

## 2013-02-26 ENCOUNTER — Encounter: Payer: Self-pay | Admitting: Family Medicine

## 2013-02-26 DIAGNOSIS — F411 Generalized anxiety disorder: Secondary | ICD-10-CM | POA: Insufficient documentation

## 2013-03-03 ENCOUNTER — Encounter: Payer: Self-pay | Admitting: Family Medicine

## 2013-03-03 ENCOUNTER — Ambulatory Visit (INDEPENDENT_AMBULATORY_CARE_PROVIDER_SITE_OTHER): Payer: PRIVATE HEALTH INSURANCE | Admitting: Family Medicine

## 2013-03-03 VITALS — BP 130/82 | HR 88 | Temp 98.3°F | Resp 18 | Wt 197.0 lb

## 2013-03-03 DIAGNOSIS — R079 Chest pain, unspecified: Secondary | ICD-10-CM

## 2013-03-03 NOTE — Progress Notes (Signed)
  Subjective:    Patient ID: [name] , male    DOB: December 09, 1965, 47 y.o.   MRN: [id number]  HPI Patient was recently admitted with atypical chest pain. He had negative cardiac enzymes x3. He was kept overnight and observed without complications. Head and normal stress test and a normal chest x-ray. His hospital discharge summary was reviewed in detail.  Patient reports the pain happened at one afternoon after returning home from work. He was sitting still and developed a squeezing sensation in the center of his chest. He denies radiation of pain. He denied shortness of breath. He is not sure if it was brought on by food eaten today. He denies any angina symptoms.  He has not had the pain return since being home from the hospital.   Past Medical History  Diagnosis Date  . Allergy   . Hypertension   . GAD (generalized anxiety disorder)    Current Outpatient Prescriptions on File Prior to Visit  Medication Sig Dispense Refill  . Aspirin-Salicylamide-Caffeine (BC HEADACHE POWDER PO) Take 1 packet by mouth daily as needed. For pain      . atenolol (TENORMIN) 50 MG tablet Take 50 mg by mouth every morning.      . clonazePAM (KLONOPIN) 0.5 MG tablet Take 0.5 mg by mouth daily.       Marland Kitchen NASAL SALINE NA Place 1 spray into the nose daily as needed. For nose bleeds      . olmesartan (BENICAR) 20 MG tablet Take 20 mg by mouth every morning.       . simvastatin (ZOCOR) 40 MG tablet Take 40 mg by mouth every morning.       . venlafaxine XR (EFFEXOR-XR) 150 MG 24 hr capsule Take 150 mg by mouth every morning.       No current facility-administered medications on file prior to visit.      Review of Systems  All other systems reviewed and are negative.       Objective:   Physical Exam  Constitutional: He appears well-developed and well-nourished.  HENT:  Head: Normocephalic.  Right Ear: External ear normal.  Left Ear: External ear normal.  Mouth/Throat: Oropharynx is clear and moist.   Eyes: Conjunctivae and EOM are normal. Pupils are equal, round, and reactive to light.  Neck: Normal range of motion. Neck supple. No JVD present. No thyromegaly present.  Cardiovascular: Normal rate, regular rhythm, normal heart sounds and intact distal pulses.  Exam reveals no gallop and no friction rub.   No murmur heard. Pulmonary/Chest: Effort normal and breath sounds normal. No respiratory distress. He has no wheezes. He has no rales. He exhibits no tenderness.  Abdominal: Soft. Bowel sounds are normal. He exhibits no distension and no mass. There is no tenderness. There is no rebound and no guarding.  Lymphadenopathy:    He has no cervical adenopathy.          Assessment & Plan:  Hospital followup Atypical chest pain most likely GI origin  Patient elects just to monitor clinically for the present time. He does not want to take a daily PPI at present his pain is only happened once, lasted 20 minutes, and resolved completely.  If pain returns and we will consider starting daily PPI therapy.

## 2013-05-02 ENCOUNTER — Other Ambulatory Visit: Payer: Self-pay | Admitting: Family Medicine

## 2013-05-03 NOTE — Telephone Encounter (Signed)
..?   OK to Refill klonopin

## 2013-05-03 NOTE — Telephone Encounter (Signed)
Ok to refill klonopin, the atenolol and zocor can have 6 month refills.

## 2013-06-06 ENCOUNTER — Other Ambulatory Visit: Payer: Self-pay | Admitting: Family Medicine

## 2013-06-06 NOTE — Telephone Encounter (Signed)
Ok to refill 

## 2013-06-07 ENCOUNTER — Telehealth: Payer: Self-pay | Admitting: Family Medicine

## 2013-06-07 MED ORDER — OLMESARTAN MEDOXOMIL 20 MG PO TABS: 20.0000 mg | ORAL_TABLET | Freq: Every morning | ORAL | Status: DC

## 2013-06-07 NOTE — Telephone Encounter (Signed)
Meds refilled.

## 2013-06-07 NOTE — Telephone Encounter (Signed)
Rx Refilled  

## 2013-06-09 ENCOUNTER — Telehealth: Payer: Self-pay | Admitting: Family Medicine

## 2013-06-09 MED ORDER — SIMVASTATIN 40 MG PO TABS: ORAL_TABLET | ORAL | Status: DC

## 2013-06-09 MED ORDER — OLMESARTAN MEDOXOMIL 20 MG PO TABS: 20.0000 mg | ORAL_TABLET | Freq: Every morning | ORAL | Status: DC

## 2013-06-09 MED ORDER — VENLAFAXINE HCL ER 150 MG PO CP24: 150.0000 mg | ORAL_CAPSULE | Freq: Every morning | ORAL | Status: DC

## 2013-06-09 MED ORDER — ATENOLOL 50 MG PO TABS: ORAL_TABLET | ORAL | Status: DC

## 2013-06-09 NOTE — Telephone Encounter (Signed)
Can have everything but klonopin.  Will not refill a controlled substance early.

## 2013-06-09 NOTE — Telephone Encounter (Signed)
Pts wife aware and meds refilled

## 2013-06-09 NOTE — Telephone Encounter (Signed)
Ok to refill early  

## 2013-07-19 ENCOUNTER — Other Ambulatory Visit: Payer: Self-pay | Admitting: Family Medicine

## 2013-07-19 ENCOUNTER — Other Ambulatory Visit: Payer: Self-pay | Admitting: Occupational Medicine

## 2013-07-19 ENCOUNTER — Ambulatory Visit: Payer: Self-pay

## 2013-07-19 DIAGNOSIS — R52 Pain, unspecified: Secondary | ICD-10-CM

## 2013-07-19 NOTE — Telephone Encounter (Signed)
?   Ok to refill, last refill 06/07/13

## 2013-07-19 NOTE — Telephone Encounter (Signed)
Med refilled.

## 2013-07-19 NOTE — Telephone Encounter (Signed)
oks 

## 2013-10-29 ENCOUNTER — Other Ambulatory Visit: Payer: Self-pay | Admitting: Family Medicine

## 2013-10-31 NOTE — Telephone Encounter (Signed)
?   OK to Refill  

## 2013-10-31 NOTE — Telephone Encounter (Signed)
ok 

## 2013-11-03 ENCOUNTER — Encounter: Payer: Self-pay | Admitting: Family Medicine

## 2013-11-03 ENCOUNTER — Ambulatory Visit (INDEPENDENT_AMBULATORY_CARE_PROVIDER_SITE_OTHER): Payer: PRIVATE HEALTH INSURANCE | Admitting: Family Medicine

## 2013-11-03 VITALS — BP 110/74 | HR 80 | Temp 98.2°F | Resp 18 | Ht 68.0 in | Wt 197.0 lb

## 2013-11-03 DIAGNOSIS — R05 Cough: Secondary | ICD-10-CM

## 2013-11-03 DIAGNOSIS — J329 Chronic sinusitis, unspecified: Secondary | ICD-10-CM

## 2013-11-03 MED ORDER — FLUTICASONE PROPIONATE 50 MCG/ACT NA SUSP: 2.0000 | Freq: Every day | NASAL | Status: DC

## 2013-11-03 NOTE — Progress Notes (Signed)
Subjective:    Patient ID: [name], male    DOB: 1966/01/30, 47 y.o.   MRN: [id number]  HPI  Patient reports exposure to asbestos in the past. His previous profession he used to cut and sand asbestos. He states that he has not had a chest x-ray in over a year. He is also concerned because his head copious rhinorrhea for almost 6 months. His nose is constantly running. He denied any sinus pressure or pain. He denies headache. He denies epistaxis. He denies pain in his teeth or his jaw. He denies ear pain or sore throat. Past Medical History  Diagnosis Date  . Allergy   . Hypertension   . GAD (generalized anxiety disorder)    Current Outpatient Prescriptions on File Prior to Visit  Medication Sig Dispense Refill  . Aspirin-Salicylamide-Caffeine (BC HEADACHE POWDER PO) Take 1 packet by mouth daily as needed. For pain      . atenolol (TENORMIN) 50 MG tablet TAKE 1 TABLET BY MOUTH EVERY DAY  90 tablet  2  . clonazePAM (KLONOPIN) 0.5 MG tablet TAKE 1 TABLET TWICE A DAY AS NEEDED  60 tablet  0  . cyclobenzaprine (FLEXERIL) 10 MG tablet TAKE 1 TABLET BY MOUTH AT BEDTIME AS NEEDED  30 tablet  1  . NASAL SALINE NA Place 1 spray into the nose daily as needed. For nose bleeds      . olmesartan (BENICAR) 20 MG tablet Take 1 tablet (20 mg total) by mouth every morning.  90 tablet  1  . simvastatin (ZOCOR) 40 MG tablet TAKE 1 TABLET BY MOUTH EVERY DAY  90 tablet  2  . venlafaxine XR (EFFEXOR-XR) 150 MG 24 hr capsule Take 1 capsule (150 mg total) by mouth every morning.  90 capsule  2   No current facility-administered medications on file prior to visit.   No Known Allergies History   Social History  . Marital Status: Divorced    Spouse Name: N/A    Number of Children: N/A  . Years of Education: N/A   Occupational History  . Not on file.   Social History Main Topics  . Smoking status: Current Every Day Smoker  . Smokeless tobacco: Not on file  . Alcohol Use: Yes  . Drug Use: No   . Sexual Activity: Not on file   Other Topics Concern  . Not on file   Social History Narrative  . No narrative on file     Review of Systems  All other systems reviewed and are negative.       Objective:   Physical Exam  Vitals reviewed. Constitutional: He appears well-developed and well-nourished.  HENT:  Right Ear: External ear normal.  Left Ear: External ear normal.  Nose: Mucosal edema and rhinorrhea present. No epistaxis. Right sinus exhibits no maxillary sinus tenderness and no frontal sinus tenderness. Left sinus exhibits no maxillary sinus tenderness and no frontal sinus tenderness.  Mouth/Throat: Oropharynx is clear and moist. No oropharyngeal exudate.  Cardiovascular: Normal rate, regular rhythm and normal heart sounds.   No murmur heard. Pulmonary/Chest: Effort normal and breath sounds normal. No respiratory distress. He has no wheezes. He has no rales.          Assessment & Plan:  1. Sinusitis, chronic Begin Flonase 2 sprays each nostril daily. Also recommended that he sleep with humidifier by his bed at night he recommended that he apply Vaseline to his nasal passages at night he - fluticasone (FLONASE) 50 MCG/ACT  nasal spray; Place 2 sprays into both nostrils daily.  Dispense: 16 g; Refill: 6  2. Cough Given his history of his asbestos exposure I would recommend CXR. - DG Chest 2 View; Future

## 2013-11-22 ENCOUNTER — Other Ambulatory Visit: Payer: Self-pay | Admitting: Family Medicine

## 2013-12-14 ENCOUNTER — Encounter: Payer: Self-pay | Admitting: Family Medicine

## 2013-12-14 NOTE — Telephone Encounter (Signed)
Pt is needing a refill on his Benicar he states that [facility] is not wanting to fill it, he doesn't know if it is because of his insurance changing or if we have not responded to the refill request Call back number is [phone] (dial [phone] and ask for him) will be back from lunch around 2   Pharmacy is [facility] [place]

## 2013-12-14 NOTE — Telephone Encounter (Signed)
This encounter was created in error - please disregard.

## 2013-12-15 ENCOUNTER — Ambulatory Visit: Payer: PRIVATE HEALTH INSURANCE | Admitting: Family Medicine

## 2014-01-02 ENCOUNTER — Other Ambulatory Visit: Payer: Self-pay | Admitting: Family Medicine

## 2014-01-02 NOTE — Telephone Encounter (Signed)
?   OK to Refill  

## 2014-01-03 ENCOUNTER — Telehealth: Payer: Self-pay | Admitting: Family Medicine

## 2014-01-03 NOTE — Telephone Encounter (Signed)
Pt is needing a refill on his Klonopin  Call back number is [phone]

## 2014-01-03 NOTE — Telephone Encounter (Signed)
ok 

## 2014-01-03 NOTE — Telephone Encounter (Signed)
?   OK to Refill  

## 2014-01-04 NOTE — Telephone Encounter (Signed)
Med was sent in on 01/02/14 and pt p/u medication on 01/03/14 per [facility]

## 2014-01-28 ENCOUNTER — Other Ambulatory Visit: Payer: Self-pay | Admitting: Family Medicine

## 2014-02-11 ENCOUNTER — Other Ambulatory Visit: Payer: Self-pay | Admitting: Family Medicine

## 2014-02-13 ENCOUNTER — Encounter: Payer: Self-pay | Admitting: Family Medicine

## 2014-02-13 NOTE — Telephone Encounter (Signed)
Medication refill for one time only.  Patient needs to be seen.  Letter sent for patient to call and schedule 

## 2014-02-22 ENCOUNTER — Other Ambulatory Visit: Payer: BC Managed Care – PPO

## 2014-02-22 DIAGNOSIS — Z0000 Encounter for general adult medical examination without abnormal findings: Secondary | ICD-10-CM

## 2014-02-22 LAB — LIPID PANEL
Cholesterol: 145 mg/dL (ref 0–200)
HDL: 56 mg/dL (ref >39)
LDL Cholesterol: 60 mg/dL (ref 0–99)
Total CHOL/HDL Ratio: 2.6 Ratio
Triglycerides: 143 mg/dL (ref <150)
VLDL: 29 mg/dL (ref 0–40)

## 2014-02-22 LAB — CBC WITH DIFFERENTIAL/PLATELET
Basophils Absolute: 0.1 10*3/uL (ref 0.0–0.1)
Basophils Relative: 1 % (ref 0–1)
EOS PCT: 4 % (ref 0–5)
Eosinophils Absolute: 0.4 10*3/uL (ref 0.0–0.7)
HCT: 45.2 % (ref 39.0–52.0)
Hemoglobin: 15.5 g/dL (ref 13.0–17.0)
LYMPHS ABS: 2.6 10*3/uL (ref 0.7–4.0)
LYMPHS PCT: 29 % (ref 12–46)
MCH: 32.2 pg (ref 26.0–34.0)
MCHC: 34.3 g/dL (ref 30.0–36.0)
MCV: 93.8 fL (ref 78.0–100.0)
Monocytes Absolute: 1.1 10*3/uL — ABNORMAL HIGH (ref 0.1–1.0)
Monocytes Relative: 12 % (ref 3–12)
NEUTROS ABS: 4.8 10*3/uL (ref 1.7–7.7)
NEUTROS PCT: 54 % (ref 43–77)
PLATELETS: 238 10*3/uL (ref 150–400)
RBC: 4.82 MIL/uL (ref 4.22–5.81)
RDW: 14.2 % (ref 11.5–15.5)
WBC: 8.8 10*3/uL (ref 4.0–10.5)

## 2014-02-22 LAB — COMPLETE METABOLIC PANEL WITH GFR
ALT: 25 U/L (ref 0–53)
AST: 26 U/L (ref 0–37)
Albumin: 3.9 g/dL (ref 3.5–5.2)
Alkaline Phosphatase: 87 U/L (ref 39–117)
BUN: 12 mg/dL (ref 6–23)
CALCIUM: 9.2 mg/dL (ref 8.4–10.5)
CHLORIDE: 102 meq/L (ref 96–112)
CO2: 25 meq/L (ref 19–32)
Creat: 0.79 mg/dL (ref 0.50–1.35)
GFR, Est Non African American: >89 mL/min
Glucose, Bld: 97 mg/dL (ref 70–99)
POTASSIUM: 4.4 meq/L (ref 3.5–5.3)
SODIUM: 137 meq/L (ref 135–145)
TOTAL PROTEIN: 6.4 g/dL (ref 6.0–8.3)
Total Bilirubin: 0.3 mg/dL (ref 0.2–1.2)

## 2014-02-22 LAB — HEMOGLOBIN A1C
Hgb A1c MFr Bld: 5.7 % — ABNORMAL HIGH (ref <5.7)
MEAN PLASMA GLUCOSE: 117 mg/dL — AB (ref <117)

## 2014-02-22 LAB — TSH: TSH: 0.877 u[IU]/mL (ref 0.350–4.500)

## 2014-03-01 ENCOUNTER — Other Ambulatory Visit: Payer: Self-pay | Admitting: Family Medicine

## 2014-03-02 ENCOUNTER — Other Ambulatory Visit: Payer: Self-pay | Admitting: Family Medicine

## 2014-03-09 ENCOUNTER — Ambulatory Visit (INDEPENDENT_AMBULATORY_CARE_PROVIDER_SITE_OTHER): Payer: BC Managed Care – PPO | Admitting: Family Medicine

## 2014-03-09 ENCOUNTER — Encounter: Payer: Self-pay | Admitting: Family Medicine

## 2014-03-09 VITALS — BP 130/80 | HR 78 | Temp 97.9°F | Resp 18 | Ht 68.0 in | Wt 198.0 lb

## 2014-03-09 DIAGNOSIS — Z0000 Encounter for general adult medical examination without abnormal findings: Secondary | ICD-10-CM

## 2014-03-09 MED ORDER — FLUTICASONE PROPIONATE 50 MCG/ACT NA SUSP: 2.0000 | Freq: Every day | NASAL | Status: DC

## 2014-03-09 NOTE — Progress Notes (Signed)
Subjective:    Patient ID: [name], male    DOB: 1966-11-19, 48 y.o.   MRN: [id number]  HPI Patient is a very pleasant 48 year old white male who is here today for complete physical exam. He has no major medical concerns. Unfortunately he continues to smoke one pack of cigarettes per day. He is in the pre-contemplative phase and smoking cessation. His most recent lab work will be listed below: Lab on 02/22/2014  Component Date Value Ref Range Status  . Cholesterol 02/22/2014 145  0 - 200 mg/dL Final   Comment: ATP III Classification:                                < 200        mg/dL        Desirable                               200 - 239     mg/dL        Borderline High                               >= 240        mg/dL        High                             . Triglycerides 02/22/2014 143  <150 mg/dL Final  . HDL 02/22/2014 56  >39 mg/dL Final  . Total CHOL/HDL Ratio 02/22/2014 2.6   Final  . VLDL 02/22/2014 29  0 - 40 mg/dL Final  . LDL Cholesterol 02/22/2014 60  0 - 99 mg/dL Final   Comment:                            Total Cholesterol/HDL Ratio:CHD Risk                                                 Coronary Heart Disease Risk Table                                                                 Men       Women                                   1/2 Average Risk              3.4        3.3                                       Average Risk              5.0  4.4                                    2X Average Risk              9.6        7.1                                    3X Average Risk             23.4       11.0                          Use the calculated Patient Ratio above and the CHD Risk table                           to determine the patient's CHD Risk.                          ATP III Classification (LDL):                                < 100        mg/dL         Optimal                               100 - 129     mg/dL         Near or Above Optimal                               130 - 159     mg/dL         Borderline High                               160 - 189     mg/dL         High                                > 190        mg/dL         Very High                             . TSH 02/22/2014 0.877  0.350 - 4.500 uIU/mL Final  . Hemoglobin A1C 02/22/2014 5.7* <5.7 % Final   Comment:  According to the ADA Clinical Practice Recommendations for 2011, when                          HbA1c is used as a screening test:                                                       >=6.5%   Diagnostic of Diabetes Mellitus                                     (if abnormal result is confirmed)                                                     5.7-6.4%   Increased risk of developing Diabetes Mellitus                                                     References:Diagnosis and Classification of Diabetes Mellitus,Diabetes                          JSEG,3151,76(HYWVP 1):S62-S69 and Standards of Medical Care in                                  Diabetes - 2011,Diabetes XTGG,2694,85 (Suppl 1):S11-S61.                             . Mean Plasma Glucose 02/22/2014 117* <117 mg/dL Final  . WBC 02/22/2014 8.8  4.0 - 10.5 K/uL Final  . RBC 02/22/2014 4.82  4.22 - 5.81 MIL/uL Final  . Hemoglobin 02/22/2014 15.5  13.0 - 17.0 g/dL Final  . HCT 02/22/2014 45.2  39.0 - 52.0 % Final  . MCV 02/22/2014 93.8  78.0 - 100.0 fL Final  . MCH 02/22/2014 32.2  26.0 - 34.0 pg Final  . MCHC 02/22/2014 34.3  30.0 - 36.0 g/dL Final  . RDW 02/22/2014 14.2  11.5 - 15.5 % Final  . Platelets 02/22/2014 238  150 - 400 K/uL Final  . Neutrophils Relative % 02/22/2014 54  43 - 77 % Final  . Neutro Abs 02/22/2014 4.8  1.7 - 7.7 K/uL Final  . Lymphocytes Relative 02/22/2014 29  12 - 46 % Final  . Lymphs Abs 02/22/2014 2.6  0.7 - 4.0 K/uL Final  . Monocytes Relative 02/22/2014 12  3 - 12 % Final    . Monocytes Absolute 02/22/2014 1.1* 0.1 - 1.0 K/uL Final  . Eosinophils Relative 02/22/2014 4  0 - 5 % Final  . Eosinophils Absolute 02/22/2014 0.4  0.0 - 0.7 K/uL Final  . Basophils Relative 02/22/2014 1  0 - 1 % Final  . Basophils Absolute 02/22/2014 0.1  0.0 - 0.1 K/uL Final  . Smear Review 02/22/2014 Criteria for  review not met   Final  . Sodium 02/22/2014 137  135 - 145 mEq/L Final  . Potassium 02/22/2014 4.4  3.5 - 5.3 mEq/L Final  . Chloride 02/22/2014 102  96 - 112 mEq/L Final  . CO2 02/22/2014 25  19 - 32 mEq/L Final  . Glucose, Bld 02/22/2014 97  70 - 99 mg/dL Final  . BUN 02/22/2014 12  6 - 23 mg/dL Final  . Creat 02/22/2014 0.79  0.50 - 1.35 mg/dL Final  . Total Bilirubin 02/22/2014 0.3  0.2 - 1.2 mg/dL Final  . Alkaline Phosphatase 02/22/2014 87  39 - 117 U/L Final  . AST 02/22/2014 26  0 - 37 U/L Final  . ALT 02/22/2014 25  0 - 53 U/L Final  . Total Protein 02/22/2014 6.4  6.0 - 8.3 g/dL Final  . Albumin 02/22/2014 3.9  3.5 - 5.2 g/dL Final  . Calcium 02/22/2014 9.2  8.4 - 10.5 mg/dL Final  . GFR, Est African American 02/22/2014 >89   Final  . GFR, Est Non African American 02/22/2014 >89   Final   Comment:                            The estimated GFR is a calculation valid for adults (>=18 years old)                          that uses the CKD-EPI algorithm to adjust for age and sex. It is                            not to be used for children, pregnant women, hospitalized patients,                             patients on dialysis, or with rapidly changing kidney function.                          According to the NKDEP, eGFR >89 is normal, 60-89 shows mild                          impairment, 30-59 shows moderate impairment, 15-29 shows severe                          impairment and <15 is ESRD.                              Past Medical History  Diagnosis Date  . Allergy   . Hypertension   . GAD (generalized anxiety disorder)    Current Outpatient Prescriptions  on File Prior to Visit  Medication Sig Dispense Refill  . Aspirin-Salicylamide-Caffeine (BC HEADACHE POWDER PO) Take 1 packet by mouth daily as needed. For pain      . atenolol (TENORMIN) 50 MG tablet TAKE 1 TABLET BY MOUTH EVERY DAY  90 tablet  2  . clonazePAM (KLONOPIN) 0.5 MG tablet TAKE 1 TABLET BY MOUTH TWICE A DAY AS NEEDED  30 tablet  2  . cyclobenzaprine (FLEXERIL) 10 MG tablet TAKE 1 TABLET BY MOUTH AT BEDTIME AS NEEDED  30 tablet  1  . fluticasone (FLONASE) 50 MCG/ACT nasal spray Place 2 sprays   into both nostrils daily.  16 g  6  . NASAL SALINE NA Place 1 spray into the nose daily as needed. For nose bleeds      . simvastatin (ZOCOR) 40 MG tablet TAKE 1 TABLET BY MOUTH EVERY DAY  90 tablet  2  . venlafaxine XR (EFFEXOR-XR) 150 MG 24 hr capsule TAKE ONE CAPSULE BY MOUTH EVERY MORNING  30 capsule  5   No current facility-administered medications on file prior to visit.   No Known Allergies History   Social History  . Marital Status: Divorced    Spouse Name: N/A    Number of Children: N/A  . Years of Education: N/A   Occupational History  . Not on file.   Social History Main Topics  . Smoking status: Current Every Day Smoker  . Smokeless tobacco: Not on file  . Alcohol Use: Yes  . Drug Use: No  . Sexual Activity: Not on file   Other Topics Concern  . Not on file   Social History Narrative  . No narrative on file   Past Surgical History  Procedure Laterality Date  . Rotator cuff repair    . Hernia repair    . Hemorrhoid surgery     Family History  Problem Relation Age of Onset  . Stroke Mother   . CAD Father       Review of Systems  All other systems reviewed and are negative.      Objective:   Physical Exam  Vitals reviewed. Constitutional: He is oriented to person, place, and time. He appears well-developed and well-nourished. No distress.  HENT:  Head: Normocephalic and atraumatic.  Right Ear: External ear normal.  Left Ear: External ear  normal.  Nose: Nose normal.  Mouth/Throat: Oropharynx is clear and moist. No oropharyngeal exudate.  Eyes: Conjunctivae and EOM are normal. Pupils are equal, round, and reactive to light. Right eye exhibits no discharge. Left eye exhibits no discharge. No scleral icterus.  Neck: Normal range of motion. Neck supple. No JVD present. No tracheal deviation present. No thyromegaly present.  Cardiovascular: Normal rate, regular rhythm, normal heart sounds and intact distal pulses.  Exam reveals no gallop and no friction rub.   No murmur heard. Pulmonary/Chest: Effort normal and breath sounds normal. No stridor. No respiratory distress. He has no wheezes. He has no rales. He exhibits no tenderness.  Abdominal: Soft. Bowel sounds are normal. He exhibits no distension and no mass. There is no tenderness. There is no rebound and no guarding.  Genitourinary: Penis normal. No penile tenderness.  Musculoskeletal: Normal range of motion. He exhibits no edema and no tenderness.  Lymphadenopathy:    He has no cervical adenopathy.  Neurological: He is alert and oriented to person, place, and time. He has normal reflexes. He displays normal reflexes. No cranial nerve deficit. He exhibits normal muscle tone. Coordination normal.  Skin: Skin is warm. No rash noted. He is not diaphoretic. No erythema. No pallor.  Psychiatric: He has a normal mood and affect. His behavior is normal. Judgment and thought content normal.   patient is fair skin but there is no evidence of skin cancer.  Testicular exam is normal        Assessment & Plan:  1. Routine general medical examination at a health care facility Patient's physical exam is normal. I recommended smoking cessation and increase aerobic exercise. Patient's blood pressure is excellent. His cholesterol is outstanding. I recommended the patient decrease his simvastatin 20 mg by  mouth daily area and given his family history of coronary artery disease, is hypertension,  hyperlipidemia, and smoking, however like to keep his LDL cholesterol less than 100. I recommended he begin using an aspirin 81 mg by mouth daily. He is noted to prostate exam colonoscopy next year. Patient second 2007. The remainder of his preventive care is up to date. I strongly encouraged smoking cessation. I discussed methods of smoking cessation including nicotine replacement and chantix.

## 2014-03-14 ENCOUNTER — Other Ambulatory Visit: Payer: Self-pay | Admitting: Family Medicine

## 2014-03-14 NOTE — Telephone Encounter (Signed)
Refill appropriate and filled per protocol. 

## 2014-03-16 ENCOUNTER — Other Ambulatory Visit: Payer: Self-pay | Admitting: Family Medicine

## 2014-03-16 NOTE — Telephone Encounter (Signed)
Medication called to pharmacy. 

## 2014-03-16 NOTE — Telephone Encounter (Signed)
ok 

## 2014-03-16 NOTE — Telephone Encounter (Signed)
Ok to refill??  Last office visit 03/09/2014.  Last refill 01/02/2014.

## 2014-06-27 ENCOUNTER — Other Ambulatory Visit: Payer: Self-pay | Admitting: Family Medicine

## 2014-06-27 NOTE — Telephone Encounter (Signed)
Ok to refill??  Last office visit 03/09/2014.  Last refill 03/16/2014, #2 refills.

## 2014-06-29 NOTE — Telephone Encounter (Signed)
ok 

## 2014-08-17 ENCOUNTER — Encounter: Payer: Self-pay | Admitting: Family Medicine

## 2014-08-17 ENCOUNTER — Ambulatory Visit (INDEPENDENT_AMBULATORY_CARE_PROVIDER_SITE_OTHER): Payer: BC Managed Care – PPO | Admitting: Family Medicine

## 2014-08-17 VITALS — BP 142/88 | HR 68 | Temp 98.1°F | Resp 14 | Ht 68.9 in | Wt 198.0 lb

## 2014-08-17 DIAGNOSIS — S2249XA Multiple fractures of ribs, unspecified side, initial encounter for closed fracture: Secondary | ICD-10-CM

## 2014-08-17 MED ORDER — HYDROCODONE-ACETAMINOPHEN 5-325 MG PO TABS: 1.0000 | ORAL_TABLET | Freq: Four times a day (QID) | ORAL | Status: DC | PRN

## 2014-08-17 NOTE — Progress Notes (Signed)
Subjective:    Patient ID: [name], male    DOB: 1966/09/16, 49 y.o.   MRN: [id number]  HPI Patient fell out of bed last evening and landed in the floor striking his left rib cage on the trashcan as well as a foot stool next is bed. He now has pain in his left ribs from an area underneath the shoulder blade on her way down to his 12th rib he is extremely tender to palpation over the bodies of the ribs in the midaxillary line. He has a large 7 inch long bruise under his left shoulder blade. He denies any coughing. He denies any hemoptysis. He denies any shortness of breath. He denies any hematuria or melena or hematochezia. He is having normal bowel movements. He denies any chest pain. He is having mild pleurisy with breathing. He is also exquisitely tender to palpation over the ribs. Past Medical History  Diagnosis Date  . Allergy   . Hypertension   . GAD (generalized anxiety disorder)    Past Surgical History  Procedure Laterality Date  . Rotator cuff repair    . Hernia repair    . Hemorrhoid surgery     Current Outpatient Prescriptions on File Prior to Visit  Medication Sig Dispense Refill  . Aspirin-Salicylamide-Caffeine (BC HEADACHE POWDER PO) Take 1 packet by mouth daily as needed. For pain      . atenolol (TENORMIN) 50 MG tablet TAKE 1 TABLET BY MOUTH EVERY DAY  90 tablet  2  . clonazePAM (KLONOPIN) 0.5 MG tablet TAKE 1 TABLET BY MOUTH TWICE A DAY AS NEEDED  30 tablet  2  . cyclobenzaprine (FLEXERIL) 10 MG tablet TAKE 1 TABLET BY MOUTH AT BEDTIME AS NEEDED  30 tablet  1  . fluticasone (FLONASE) 50 MCG/ACT nasal spray Place 2 sprays into both nostrils daily.  16 g  6  . NASAL SALINE NA Place 1 spray into the nose daily as needed. For nose bleeds      . simvastatin (ZOCOR) 40 MG tablet TAKE 1 TABLET BY MOUTH EVERY DAY  90 tablet  2  . venlafaxine XR (EFFEXOR-XR) 150 MG 24 hr capsule TAKE 1 CAPSULE (150 MG TOTAL) BY MOUTH EVERY MORNING.  90 capsule  1   No current  facility-administered medications on file prior to visit.   No Known Allergies History   Social History  . Marital Status: Divorced    Spouse Name: N/A    Number of Children: N/A  . Years of Education: N/A   Occupational History  . Not on file.   Social History Main Topics  . Smoking status: Current Every Day Smoker  . Smokeless tobacco: Not on file  . Alcohol Use: Yes  . Drug Use: No  . Sexual Activity: Not on file   Other Topics Concern  . Not on file   Social History Narrative  . No narrative on file      Review of Systems  All other systems reviewed and are negative.      Objective:   Physical Exam  Vitals reviewed. Cardiovascular: Normal rate, regular rhythm and normal heart sounds.   Pulmonary/Chest: Effort normal and breath sounds normal. No respiratory distress. He has no wheezes. He has no rales. He exhibits tenderness.  Abdominal: Soft. Bowel sounds are normal. He exhibits no distension and no mass. There is no tenderness. There is no rebound and no guarding.          Assessment & Plan:  Fracture of multiple ribs, unspecified laterality, closed, initial encounter - Plan: HYDROcodone-acetaminophen ([place]) 5-325 MG per tablet   I suspect the patient has fractured at least 2 ribs. There is no evidence of pneumothorax, renal laceration, or bowel damage. I would treat the patient symptomatically with hydrocodone 5-10 mg every 6 hours as needed for pain. He was given 30.  Also recommended the patient wear a rib belt.  Recheck immediately if the patient's symptoms worsen otherwise anticipate gradual improvement over the next 2-4 weeks.

## 2014-08-21 ENCOUNTER — Telehealth: Payer: Self-pay | Admitting: Family Medicine

## 2014-08-21 NOTE — Telephone Encounter (Signed)
Patient is calling to let dr pickard or you know that he is still in severe pain, and needs to know what to do about extending a doctors note or does he needs to come back in  [phone]

## 2014-08-21 NOTE — Telephone Encounter (Signed)
Ok extending note through [place].

## 2014-08-22 NOTE — Telephone Encounter (Signed)
Pt aware and states that he is in a lot of pain still and may not be able to work the rest of the week but will call back Wednesday to let us know how he is doing.

## 2014-08-22 NOTE — Telephone Encounter (Signed)
Tried to call no answer and no vm 

## 2014-08-22 NOTE — Telephone Encounter (Signed)
Per [facility] if pt calls back and needs to be out the rest of the week he needs a CXR.

## 2014-08-29 ENCOUNTER — Ambulatory Visit (INDEPENDENT_AMBULATORY_CARE_PROVIDER_SITE_OTHER): Payer: BC Managed Care – PPO | Admitting: Family Medicine

## 2014-08-29 ENCOUNTER — Encounter: Payer: Self-pay | Admitting: Family Medicine

## 2014-08-29 VITALS — BP 130/90 | HR 80 | Temp 98.1°F | Resp 16 | Ht 68.0 in | Wt 194.0 lb

## 2014-08-29 DIAGNOSIS — S2249XA Multiple fractures of ribs, unspecified side, initial encounter for closed fracture: Secondary | ICD-10-CM

## 2014-08-29 MED ORDER — KETOROLAC TROMETHAMINE 10 MG PO TABS: 10.0000 mg | ORAL_TABLET | Freq: Four times a day (QID) | ORAL | Status: DC | PRN

## 2014-08-29 NOTE — Progress Notes (Signed)
Subjective:    Patient ID: [name], male    DOB: 11-Mar-1966, 48 y.o.   MRN: [id number]  HPI 08/21/14 Patient fell out of bed last evening and landed in the floor striking his left rib cage on the trashcan as well as a foot stool next is bed. He now has pain in his left ribs from an area underneath the shoulder blade all the way down to his 12th rib.  He is extremely tender to palpation over the bodies of the ribs in the midaxillary line. He has a large 7 inch long bruise under his left shoulder blade. He denies any coughing. He denies any hemoptysis. He denies any shortness of breath. He denies any hematuria or melena or hematochezia. He is having normal bowel movements. He denies any chest pain. He is having mild pleurisy with breathing. He is also exquisitely tender to palpation over the ribs.  At that time, my plan was:  I suspect the patient has fractured at least 2 ribs. There is no evidence of pneumothorax, renal laceration, or bowel damage. I would treat the patient symptomatically with hydrocodone 5-10 mg every 6 hours as needed for pain. He was given 30.  Also recommended the patient wear a rib belt.  Recheck immediately if the patient's symptoms worsen otherwise anticipate gradual improvement over the next 2-4 weeks.  08/29/14 Patient has not been able to return to work. In fact the pain in his left eye was so severe last Friday he went to the emergency room at [facility].  Patient states that they performed a chest x-ray which revealed 2-3 fractured ribs. He was treated with Toradol which significantly improved his pain. Patient is okay his pain as long as he is remaining still and sitting down. 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  He is unable to perform this at the present time due to the severe pain. Past Medical History  Diagnosis Date  . Allergy   . Hypertension     . GAD (generalized anxiety disorder)    Past Surgical History  Procedure Laterality Date  . Rotator cuff repair    . Hernia repair    . Hemorrhoid surgery     Current Outpatient Prescriptions on File Prior to Visit  Medication Sig Dispense Refill  . Aspirin-Salicylamide-Caffeine (BC HEADACHE POWDER PO) Take 1 packet by mouth daily as needed. For pain      . atenolol (TENORMIN) 50 MG tablet TAKE 1 TABLET BY MOUTH EVERY DAY  90 tablet  2  . clonazePAM (KLONOPIN) 0.5 MG tablet TAKE 1 TABLET BY MOUTH TWICE A DAY AS NEEDED  30 tablet  2  . cyclobenzaprine (FLEXERIL) 10 MG tablet TAKE 1 TABLET BY MOUTH AT BEDTIME AS NEEDED  30 tablet  1  . fluticasone (FLONASE) 50 MCG/ACT nasal spray Place 2 sprays into both nostrils daily.  16 g  6  . HYDROcodone-acetaminophen (NORCO) 5-325 MG per tablet Take 1-2 tablets by mouth every 6 (six) hours as needed for moderate pain.  30 tablet  0  . NASAL SALINE NA Place 1 spray into the nose daily as needed. For nose bleeds      . simvastatin (ZOCOR) 40 MG tablet TAKE 1 TABLET BY MOUTH EVERY DAY  90 tablet  2  . venlafaxine XR (EFFEXOR-XR) 150 MG 24 hr capsule TAKE 1 CAPSULE (150 MG TOTAL) BY MOUTH EVERY MORNING.  90 capsule  1   No current facility-administered medications on file prior to visit.   No Known Allergies History   Social History  . Marital Status: Divorced    Spouse Name: N/A    Number of Children: N/A  . Years of Education: N/A   Occupational History  . Not on file.   Social History Main Topics  . Smoking status: Current Every Day Smoker  . Smokeless tobacco: Not on file  . Alcohol Use: Yes  . Drug Use: No  . Sexual Activity: Not on file   Other Topics Concern  . Not on file   Social History Narrative  . No narrative on file      Review of Systems  All other systems reviewed and are negative.      Objective:   Physical Exam  Vitals reviewed. Cardiovascular: Normal rate, regular rhythm and normal heart sounds.    Pulmonary/Chest: Effort normal and breath sounds normal. No respiratory distress. He has no wheezes. He has no rales. He exhibits tenderness.  Abdominal: Soft. Bowel sounds are normal. He exhibits no distension and no mass. There is no tenderness. There is no rebound and no guarding.          Assessment & Plan:  Fracture of multiple ribs, unspecified laterality, closed, initial encounter - Plan: ketorolac (TORADOL) 10 MG tablet, CANCELED: DG Ribs Unilateral Left   I saw the patient on Toradol 10 mg every 6 hours for the next 5 days. The patient 1 additional week. I explained to the patient that he can return to light duty next week. I anticipate that over the next 6 weeks to be able to resume full duty. I will depend on the patient's honesty when he feels like the pain is well enough controlled that he can resume full duty.

## 2014-09-27 ENCOUNTER — Other Ambulatory Visit: Payer: Self-pay | Admitting: Family Medicine

## 2014-09-27 NOTE — Telephone Encounter (Signed)
?   OK to Refill  

## 2014-09-28 ENCOUNTER — Telehealth: Payer: Self-pay | Admitting: Family Medicine

## 2014-09-28 MED ORDER — CLONAZEPAM 0.5 MG PO TABS: ORAL_TABLET | ORAL | Status: DC

## 2014-09-28 NOTE — Telephone Encounter (Signed)
Patient is asking for refill on his klonopin says he has called pharmacy and they told him to call us,  [facility] [place]  His number with questions is [phone] he said press [phone] when we dial that number

## 2014-09-28 NOTE — Telephone Encounter (Signed)
Med called to pharm 

## 2014-10-25 ENCOUNTER — Other Ambulatory Visit: Payer: Self-pay | Admitting: Family Medicine

## 2014-12-07 ENCOUNTER — Encounter: Payer: Self-pay | Admitting: Family Medicine

## 2014-12-07 ENCOUNTER — Ambulatory Visit (INDEPENDENT_AMBULATORY_CARE_PROVIDER_SITE_OTHER): Payer: BLUE CROSS/BLUE SHIELD | Admitting: Family Medicine

## 2014-12-07 VITALS — BP 120/84 | HR 72 | Temp 99.2°F | Resp 18 | Ht 68.0 in | Wt 195.0 lb

## 2014-12-07 DIAGNOSIS — R1030 Lower abdominal pain, unspecified: Secondary | ICD-10-CM

## 2014-12-07 LAB — COMPLETE METABOLIC PANEL WITH GFR
ALT: 18 U/L (ref 0–53)
AST: 17 U/L (ref 0–37)
Albumin: 3.8 g/dL (ref 3.5–5.2)
Alkaline Phosphatase: 104 U/L (ref 39–117)
BUN: 7 mg/dL (ref 6–23)
CO2: 25 mEq/L (ref 19–32)
Calcium: 9.3 mg/dL (ref 8.4–10.5)
Chloride: 102 mEq/L (ref 96–112)
Creat: 0.88 mg/dL (ref 0.50–1.35)
GFR, Est African American: >89 mL/min
GFR, Est Non African American: >89 mL/min
Glucose, Bld: 102 mg/dL — ABNORMAL HIGH (ref 70–99)
Potassium: 3.9 mEq/L (ref 3.5–5.3)
Sodium: 138 mEq/L (ref 135–145)
Total Bilirubin: 0.6 mg/dL (ref 0.2–1.2)
Total Protein: 6.6 g/dL (ref 6.0–8.3)

## 2014-12-07 LAB — CBC WITH DIFFERENTIAL/PLATELET
BASOS PCT: 0 % (ref 0–1)
Basophils Absolute: 0 10*3/uL (ref 0.0–0.1)
Eosinophils Absolute: 0 10*3/uL (ref 0.0–0.7)
Eosinophils Relative: 0 % (ref 0–5)
HCT: 49.4 % (ref 39.0–52.0)
Hemoglobin: 17 g/dL (ref 13.0–17.0)
Lymphocytes Relative: 10 % — ABNORMAL LOW (ref 12–46)
Lymphs Abs: 2.2 10*3/uL (ref 0.7–4.0)
MCH: 32 pg (ref 26.0–34.0)
MCHC: 34.4 g/dL (ref 30.0–36.0)
MCV: 92.9 fL (ref 78.0–100.0)
MPV: 10.1 fL (ref 8.6–12.4)
Monocytes Absolute: 3.3 10*3/uL — ABNORMAL HIGH (ref 0.1–1.0)
Monocytes Relative: 15 % — ABNORMAL HIGH (ref 3–12)
NEUTROS ABS: 16.4 10*3/uL — AB (ref 1.7–7.7)
NEUTROS PCT: 75 % (ref 43–77)
Platelets: 288 10*3/uL (ref 150–400)
RBC: 5.32 MIL/uL (ref 4.22–5.81)
RDW: 13.6 % (ref 11.5–15.5)
WBC: 21.8 10*3/uL — ABNORMAL HIGH (ref 4.0–10.5)

## 2014-12-07 LAB — LIPASE: Lipase: <10 U/L (ref 0–75)

## 2014-12-07 NOTE — Progress Notes (Signed)
Subjective:    Patient ID: [name], male    DOB: [birth year], 49 y.o.   MRN: [id number]  HPI  Patient reports 4 days of sore throat.  He also reports ulcers on the side of his tongue and white bumps in his posterior oropharynx.  Neither are able to be seen today on exam. Patient is 50% better today.  However, he also reports 2 months of daily constant lower abd pain which is worsening and ctually caused him to miss work today.  He denies nausea, vomiting, diarrhea, or constipation.  He denies dysuria or hematuria.   Past Medical History  Diagnosis Date  . Allergy   . Hypertension   . GAD (generalized anxiety disorder)    Past Surgical History  Procedure Laterality Date  . Rotator cuff repair    . Hernia repair    . Hemorrhoid surgery     Current Outpatient Prescriptions on File Prior to Visit  Medication Sig Dispense Refill  . Aspirin-Salicylamide-Caffeine (BC HEADACHE POWDER PO) Take 1 packet by mouth daily as needed. For pain    . atenolol (TENORMIN) 50 MG tablet TAKE 1 TABLET BY MOUTH EVERY DAY 90 tablet 2  . clonazePAM (KLONOPIN) 0.5 MG tablet TAKE 1 TABLET BY MOUTH TWICE A DAY AS NEEDED - MUST LAST 30 DAYS 30 tablet 2  . cyclobenzaprine (FLEXERIL) 10 MG tablet TAKE 1 TABLET BY MOUTH AT BEDTIME AS NEEDED 30 tablet 1  . fluticasone (FLONASE) 50 MCG/ACT nasal spray Place 2 sprays into both nostrils daily. 16 g 6  . HYDROcodone-acetaminophen (NORCO) 5-325 MG per tablet Take 1-2 tablets by mouth every 6 (six) hours as needed for moderate pain. 30 tablet 0  . ketorolac (TORADOL) 10 MG tablet Take 1 tablet (10 mg total) by mouth every 6 (six) hours as needed. 20 tablet 0  . NASAL SALINE NA Place 1 spray into the nose daily as needed. For nose bleeds    . simvastatin (ZOCOR) 40 MG tablet TAKE 1 TABLET BY MOUTH EVERY DAY 90 tablet 2  . venlafaxine XR (EFFEXOR-XR) 150 MG 24 hr capsule TAKE 1 CAPSULE (150 MG TOTAL) BY MOUTH EVERY MORNING. 90 capsule 1   No current  facility-administered medications on file prior to visit.   No Known Allergies History   Social History  . Marital Status: Divorced    Spouse Name: N/A    Number of Children: N/A  . Years of Education: N/A   Occupational History  . Not on file.   Social History Main Topics  . Smoking status: Current Every Day Smoker  . Smokeless tobacco: Not on file  . Alcohol Use: Yes  . Drug Use: No  . Sexual Activity: Not on file   Other Topics Concern  . Not on file   Social History Narrative     Review of Systems  All other systems reviewed and are negative.      Objective:   Physical Exam  Constitutional: He appears well-developed and well-nourished. No distress.  HENT:  Right Ear: External ear normal.  Left Ear: External ear normal.  Nose: Nose normal.  Mouth/Throat: Oropharynx is clear and moist. No oropharyngeal exudate.  Eyes: Conjunctivae and EOM are normal. Pupils are equal, round, and reactive to light. Right eye exhibits no discharge. Left eye exhibits no discharge. No scleral icterus.  Neck: Normal range of motion. Neck supple. No JVD present. No thyromegaly present.  Cardiovascular: Normal rate, regular rhythm and normal heart sounds.   No  murmur heard. Pulmonary/Chest: Effort normal and breath sounds normal. No respiratory distress. He has no wheezes. He has no rales.  Abdominal: Soft. He exhibits distension. He exhibits no mass. There is tenderness. There is guarding. There is no rebound.  Lymphadenopathy:    He has no cervical adenopathy.  Skin: He is not diaphoretic.  Vitals reviewed.         Assessment & Plan:  Lower abdominal pain - Plan: COMPLETE METABOLIC PANEL WITH GFR, CBC with Differential, Sedimentation rate, Lipase  I believe the patient had apthous ulcers and I recommended tincture of time and magic mouthwash prn.  However, the pain is due to an uncertain etiology and worsening after 2 months.  Therefore, I will proceed with CT of abd + pelvis  with contrast ASAP.

## 2014-12-08 ENCOUNTER — Inpatient Hospital Stay (HOSPITAL_COMMUNITY)
Admission: EM | Admit: 2014-12-08 | Discharge: 2014-12-11 | DRG: 392 | Disposition: A | Discharge status: Home / Self Care | Payer: BLUE CROSS/BLUE SHIELD | Attending: Internal Medicine | Admitting: Internal Medicine

## 2014-12-08 ENCOUNTER — Encounter: Payer: Self-pay | Admitting: Family Medicine

## 2014-12-08 ENCOUNTER — Emergency Department (HOSPITAL_COMMUNITY): Payer: BLUE CROSS/BLUE SHIELD

## 2014-12-08 ENCOUNTER — Ambulatory Visit (HOSPITAL_COMMUNITY): Payer: PRIVATE HEALTH INSURANCE

## 2014-12-08 ENCOUNTER — Encounter (HOSPITAL_COMMUNITY): Payer: Self-pay | Admitting: *Deleted

## 2014-12-08 DIAGNOSIS — K5732 Diverticulitis of large intestine without perforation or abscess without bleeding: Principal | ICD-10-CM | POA: Diagnosis present

## 2014-12-08 DIAGNOSIS — F17210 Nicotine dependence, cigarettes, uncomplicated: Secondary | ICD-10-CM | POA: Diagnosis present

## 2014-12-08 DIAGNOSIS — Z6830 Body mass index (BMI) 30.0-30.9, adult: Secondary | ICD-10-CM | POA: Diagnosis not present

## 2014-12-08 DIAGNOSIS — R739 Hyperglycemia, unspecified: Secondary | ICD-10-CM | POA: Diagnosis present

## 2014-12-08 DIAGNOSIS — I10 Essential (primary) hypertension: Secondary | ICD-10-CM | POA: Diagnosis present

## 2014-12-08 DIAGNOSIS — Z720 Tobacco use: Secondary | ICD-10-CM | POA: Diagnosis present

## 2014-12-08 DIAGNOSIS — E669 Obesity, unspecified: Secondary | ICD-10-CM | POA: Diagnosis present

## 2014-12-08 DIAGNOSIS — Z823 Family history of stroke: Secondary | ICD-10-CM

## 2014-12-08 DIAGNOSIS — E785 Hyperlipidemia, unspecified: Secondary | ICD-10-CM | POA: Diagnosis present

## 2014-12-08 DIAGNOSIS — Z833 Family history of diabetes mellitus: Secondary | ICD-10-CM

## 2014-12-08 DIAGNOSIS — Z23 Encounter for immunization: Secondary | ICD-10-CM

## 2014-12-08 DIAGNOSIS — R1032 Left lower quadrant pain: Secondary | ICD-10-CM | POA: Diagnosis not present

## 2014-12-08 DIAGNOSIS — K529 Noninfective gastroenteritis and colitis, unspecified: Secondary | ICD-10-CM | POA: Diagnosis present

## 2014-12-08 DIAGNOSIS — F411 Generalized anxiety disorder: Secondary | ICD-10-CM | POA: Diagnosis present

## 2014-12-08 DIAGNOSIS — Z8249 Family history of ischemic heart disease and other diseases of the circulatory system: Secondary | ICD-10-CM | POA: Diagnosis not present

## 2014-12-08 LAB — CBC WITH DIFFERENTIAL/PLATELET
BASOS ABS: 0 10*3/uL (ref 0.0–0.1)
BASOS PCT: 0 % (ref 0–1)
Eosinophils Absolute: 0.2 10*3/uL (ref 0.0–0.7)
Eosinophils Relative: 2 % (ref 0–5)
HCT: 48.2 % (ref 39.0–52.0)
HEMOGLOBIN: 15.5 g/dL (ref 13.0–17.0)
LYMPHS ABS: 2.1 10*3/uL (ref 0.7–4.0)
LYMPHS PCT: 15 % (ref 12–46)
MCH: 31.2 pg (ref 26.0–34.0)
MCHC: 32.2 g/dL (ref 30.0–36.0)
MCV: 97 fL (ref 78.0–100.0)
MONOS PCT: 11 % (ref 3–12)
Monocytes Absolute: 1.6 10*3/uL — ABNORMAL HIGH (ref 0.1–1.0)
Neutro Abs: 10.3 10*3/uL — ABNORMAL HIGH (ref 1.7–7.7)
Neutrophils Relative %: 72 % (ref 43–77)
Platelets: 246 10*3/uL (ref 150–400)
RBC: 4.97 MIL/uL (ref 4.22–5.81)
RDW: 13.4 % (ref 11.5–15.5)
WBC: 14.2 10*3/uL — AB (ref 4.0–10.5)

## 2014-12-08 LAB — URINE MICROSCOPIC-ADD ON

## 2014-12-08 LAB — BASIC METABOLIC PANEL
Anion gap: 9 (ref 5–15)
BUN: 11 mg/dL (ref 6–23)
CHLORIDE: 103 meq/L (ref 96–112)
CO2: 26 mmol/L (ref 19–32)
Calcium: 9.3 mg/dL (ref 8.4–10.5)
Creatinine, Ser: 0.95 mg/dL (ref 0.50–1.35)
GLUCOSE: 137 mg/dL — AB (ref 70–99)
Potassium: 3.9 mmol/L (ref 3.5–5.1)
Sodium: 138 mmol/L (ref 135–145)

## 2014-12-08 LAB — URINALYSIS, ROUTINE W REFLEX MICROSCOPIC
Glucose, UA: NEGATIVE mg/dL
Hgb urine dipstick: NEGATIVE
Ketones, ur: 15 mg/dL — AB
LEUKOCYTES UA: NEGATIVE
NITRITE: NEGATIVE
PH: 6 (ref 5.0–8.0)
Protein, ur: 30 mg/dL — AB
UROBILINOGEN UA: 0.2 mg/dL (ref 0.0–1.0)

## 2014-12-08 LAB — CBG MONITORING, ED: GLUCOSE-CAPILLARY: 337 mg/dL — AB (ref 70–99)

## 2014-12-08 LAB — SEDIMENTATION RATE: Sed Rate: 1 mm/hr (ref 0–16)

## 2014-12-08 MED ORDER — ONDANSETRON HCL 4 MG PO TABS: 4.0000 mg | ORAL_TABLET | Freq: Four times a day (QID) | ORAL | Status: DC | PRN

## 2014-12-08 MED ORDER — CLONAZEPAM 0.5 MG PO TABS
0.5000 mg | ORAL_TABLET | Freq: Two times a day (BID) | ORAL | Status: DC | PRN
  Administered 2014-12-08: 0.5 mg via ORAL
  Filled 2014-12-08 (×2): qty 1

## 2014-12-08 MED ORDER — METRONIDAZOLE IN NACL 5-0.79 MG/ML-% IV SOLN
500.0000 mg | Freq: Once | INTRAVENOUS | Status: AC
  Administered 2014-12-08: 500 mg via INTRAVENOUS
  Filled 2014-12-08: qty 100

## 2014-12-08 MED ORDER — MORPHINE SULFATE 4 MG/ML IJ SOLN
4.0000 mg | Freq: Once | INTRAMUSCULAR | Status: AC
  Administered 2014-12-08: 4 mg via INTRAVENOUS
  Filled 2014-12-08: qty 1

## 2014-12-08 MED ORDER — ACETAMINOPHEN 500 MG PO TABS
ORAL_TABLET | ORAL | Status: AC
  Filled 2014-12-08: qty 2

## 2014-12-08 MED ORDER — PNEUMOCOCCAL VAC POLYVALENT 25 MCG/0.5ML IJ INJ
0.5000 mL | INJECTION | INTRAMUSCULAR | Status: AC
  Administered 2014-12-09: 0.5 mL via INTRAMUSCULAR
  Filled 2014-12-08: qty 0.5

## 2014-12-08 MED ORDER — CIPROFLOXACIN IN D5W 400 MG/200ML IV SOLN
400.0000 mg | Freq: Once | INTRAVENOUS | Status: DC
  Filled 2014-12-08: qty 200

## 2014-12-08 MED ORDER — ATENOLOL 25 MG PO TABS
50.0000 mg | ORAL_TABLET | Freq: Every day | ORAL | Status: DC
  Administered 2014-12-09 – 2014-12-11 (×3): 50 mg via ORAL
  Filled 2014-12-08 (×3): qty 2

## 2014-12-08 MED ORDER — IOHEXOL 300 MG/ML  SOLN
100.0000 mL | Freq: Once | INTRAMUSCULAR | Status: AC | PRN
  Administered 2014-12-08: 100 mL via INTRAVENOUS

## 2014-12-08 MED ORDER — FLUTICASONE PROPIONATE 50 MCG/ACT NA SUSP
2.0000 | Freq: Every day | NASAL | Status: DC
  Administered 2014-12-09 – 2014-12-11 (×3): 2 via NASAL
  Filled 2014-12-08: qty 16

## 2014-12-08 MED ORDER — VENLAFAXINE HCL ER 75 MG PO CP24
150.0000 mg | ORAL_CAPSULE | Freq: Every day | ORAL | Status: DC
  Administered 2014-12-09 – 2014-12-11 (×3): 150 mg via ORAL
  Filled 2014-12-08 (×3): qty 2

## 2014-12-08 MED ORDER — MORPHINE SULFATE 4 MG/ML IJ SOLN
4.0000 mg | INTRAMUSCULAR | Status: DC | PRN
  Administered 2014-12-08 – 2014-12-09 (×6): 4 mg via INTRAVENOUS
  Filled 2014-12-08 (×6): qty 1

## 2014-12-08 MED ORDER — SODIUM CHLORIDE 0.9 % IV SOLN
INTRAVENOUS | Status: DC
  Administered 2014-12-08 – 2014-12-10 (×4): via INTRAVENOUS

## 2014-12-08 MED ORDER — ONDANSETRON HCL 4 MG/2ML IJ SOLN: 4.0000 mg | Freq: Four times a day (QID) | INTRAMUSCULAR | Status: DC | PRN

## 2014-12-08 MED ORDER — SODIUM CHLORIDE 0.9 % IV BOLUS (SEPSIS)
1000.0000 mL | Freq: Once | INTRAVENOUS | Status: AC
  Administered 2014-12-08: 1000 mL via INTRAVENOUS

## 2014-12-08 MED ORDER — METRONIDAZOLE IN NACL 5-0.79 MG/ML-% IV SOLN
500.0000 mg | Freq: Three times a day (TID) | INTRAVENOUS | Status: DC
  Administered 2014-12-09 – 2014-12-11 (×8): 500 mg via INTRAVENOUS
  Filled 2014-12-08 (×8): qty 100

## 2014-12-08 MED ORDER — ACETAMINOPHEN 500 MG PO TABS
1000.0000 mg | ORAL_TABLET | Freq: Once | ORAL | Status: AC
  Administered 2014-12-08: 1000 mg via ORAL
  Filled 2014-12-08: qty 2

## 2014-12-08 MED ORDER — CIPROFLOXACIN IN D5W 400 MG/200ML IV SOLN
400.0000 mg | Freq: Two times a day (BID) | INTRAVENOUS | Status: DC
  Administered 2014-12-08 – 2014-12-11 (×6): 400 mg via INTRAVENOUS
  Filled 2014-12-08 (×5): qty 200

## 2014-12-08 MED ORDER — SODIUM CHLORIDE 0.9 % IV SOLN: INTRAVENOUS | Status: DC

## 2014-12-08 MED ORDER — SIMVASTATIN 20 MG PO TABS: 40.0000 mg | ORAL_TABLET | Freq: Every day | ORAL | Status: DC

## 2014-12-08 MED ORDER — ATORVASTATIN CALCIUM 20 MG PO TABS
20.0000 mg | ORAL_TABLET | Freq: Every day | ORAL | Status: DC
  Administered 2014-12-09 – 2014-12-10 (×2): 20 mg via ORAL
  Filled 2014-12-08 (×2): qty 1

## 2014-12-08 MED ORDER — HEPARIN SODIUM (PORCINE) 5000 UNIT/ML IJ SOLN
5000.0000 [IU] | Freq: Three times a day (TID) | INTRAMUSCULAR | Status: DC
  Administered 2014-12-08 – 2014-12-11 (×8): 5000 [IU] via SUBCUTANEOUS
  Filled 2014-12-08 (×8): qty 1

## 2014-12-08 NOTE — ED Provider Notes (Signed)
CSN: [id number]     Arrival date & time 12/08/14  1050 History  This chart was scribed for Ephraim Hamburger, MD by Einar Pheasant, ED Scribe. This patient was seen in room APA05/APA05 and the patient's care was started at 2:16 PM.    Chief Complaint  Patient presents with  . Abdominal Pain   The history is provided by the patient and medical records. No language interpreter was used.   HPI Comments: [name] is a 49 y.o. male with PMhx of seasonal allergies, HTN, and GAD presents to the Emergency Department complaining of gradual onset persistent lower abdominal pain for the past year, which was exacerbated 4 days ago. Pt states that he was advised by her PCP, Dr. Dennard Schaumann, to present to the ED for evaluation of his abdominal pain secondary to his elevated WBC. At CT scan was also ordered by him but he has not received the imaging results. Pt states that the abdominal pain is exacerbated by laying flat on his back and currently rates his pain as a "8/10".  He denies taking any pain medication. Pt also admits to having Headaches. Denies any constipation, diarrhea, nausea, emesis, blood in stool, back pain, fever, chills, diaphoresis, decreased appetite, weakness, or numbness.   Past Medical History  Diagnosis Date  . Allergy   . Hypertension   . GAD (generalized anxiety disorder)    Past Surgical History  Procedure Laterality Date  . Rotator cuff repair    . Hernia repair    . Hemorrhoid surgery     Family History  Problem Relation Age of Onset  . Stroke Mother   . CAD Father    History  Substance Use Topics  . Smoking status: Current Every Day Smoker -- 1.00 packs/day    Types: Cigarettes  . Smokeless tobacco: Not on file  . Alcohol Use: Yes    Review of Systems  Constitutional: Negative for fever.  HENT: Negative for sinus pressure.   Cardiovascular: Negative for chest pain.  Gastrointestinal: Positive for abdominal pain. Negative for nausea, vomiting, diarrhea and  blood in stool.  Genitourinary: Negative for dysuria, frequency and hematuria.  Musculoskeletal: Negative for back pain.  All other systems reviewed and are negative.  Allergies  Review of patient's allergies indicates no known allergies.  Home Medications   Prior to Admission medications   Medication Sig Start Date End Date Taking? Authorizing Provider  Aspirin-Salicylamide-Caffeine (BC HEADACHE POWDER PO) Take 1 packet by mouth daily as needed. For pain    Historical Provider, MD  atenolol (TENORMIN) 50 MG tablet TAKE 1 TABLET BY MOUTH EVERY DAY 10/25/14   Susy Frizzle, MD  clonazePAM (KLONOPIN) 0.5 MG tablet TAKE 1 TABLET BY MOUTH TWICE A DAY AS NEEDED - MUST LAST 30 DAYS 09/28/14   Susy Frizzle, MD  cyclobenzaprine (FLEXERIL) 10 MG tablet TAKE 1 TABLET BY MOUTH AT BEDTIME AS NEEDED 10/29/13   Susy Frizzle, MD  fluticasone (FLONASE) 50 MCG/ACT nasal spray Place 2 sprays into both nostrils daily. 03/09/14   Susy Frizzle, MD  HYDROcodone-acetaminophen (NORCO) 5-325 MG per tablet Take 1-2 tablets by mouth every 6 (six) hours as needed for moderate pain. 08/17/14   Susy Frizzle, MD  ketorolac (TORADOL) 10 MG tablet Take 1 tablet (10 mg total) by mouth every 6 (six) hours as needed. 08/29/14   Susy Frizzle, MD  NASAL SALINE NA Place 1 spray into the nose daily as needed. For nose bleeds  Historical Provider, MD  simvastatin (ZOCOR) 40 MG tablet TAKE 1 TABLET BY MOUTH EVERY DAY 10/25/14   Susy Frizzle, MD  venlafaxine XR (EFFEXOR-XR) 150 MG 24 hr capsule TAKE 1 CAPSULE (150 MG TOTAL) BY MOUTH EVERY MORNING.    Susy Frizzle, MD   BP 139/96 mmHg  Pulse 98  Temp(Src) 98.5 F (36.9 C) (Oral)  Ht 5\' 8"  (1.727 m)  Wt 200 lb (90.719 kg)  BMI 30.42 kg/m2  SpO2 98%  Physical Exam  Constitutional: He is oriented to person, place, and time. He appears well-developed and well-nourished. No distress.  HENT:  Head: Normocephalic and atraumatic.  Eyes: Right eye exhibits  no discharge. Left eye exhibits no discharge.  Neck: Neck supple.  Cardiovascular: Normal rate, regular rhythm and normal heart sounds.   Pulmonary/Chest: Effort normal and breath sounds normal. No respiratory distress.  Abdominal: Soft. He exhibits no distension. There is tenderness.  No CVA tenderness. Diffuse lower abdominal tenderness with voluntary guarding.   Neurological: He is alert and oriented to person, place, and time.  Skin: Skin is warm and dry.  Psychiatric: He has a normal mood and affect. His behavior is normal.  Nursing note and vitals reviewed.   ED Course  Procedures (including critical care time)  DIAGNOSTIC STUDIES: Oxygen Saturation is 98% on RA, normal by my interpretation.    COORDINATION OF CARE: 2:22 PM- Pt advised of plan for treatment and pt agrees.  Results for orders placed or performed during the hospital encounter of 12/08/14  CBC with Differential  Result Value Ref Range   WBC 14.2 (H) 4.0 - 10.5 K/uL   RBC 4.97 4.22 - 5.81 MIL/uL   Hemoglobin 15.5 13.0 - 17.0 g/dL   HCT 48.2 39.0 - 52.0 %   MCV 97.0 78.0 - 100.0 fL   MCH 31.2 26.0 - 34.0 pg   MCHC 32.2 30.0 - 36.0 g/dL   RDW 13.4 11.5 - 15.5 %   Platelets 246 150 - 400 K/uL   Neutrophils Relative % 72 43 - 77 %   Neutro Abs 10.3 (H) 1.7 - 7.7 K/uL   Lymphocytes Relative 15 12 - 46 %   Lymphs Abs 2.1 0.7 - 4.0 K/uL   Monocytes Relative 11 3 - 12 %   Monocytes Absolute 1.6 (H) 0.1 - 1.0 K/uL   Eosinophils Relative 2 0 - 5 %   Eosinophils Absolute 0.2 0.0 - 0.7 K/uL   Basophils Relative 0 0 - 1 %   Basophils Absolute 0.0 0.0 - 0.1 K/uL  Basic metabolic panel  Result Value Ref Range   Sodium 138 135 - 145 mmol/L   Potassium 3.9 3.5 - 5.1 mmol/L   Chloride 103 96 - 112 mEq/L   CO2 26 19 - 32 mmol/L   Glucose, Bld 137 (H) 70 - 99 mg/dL   BUN 11 6 - 23 mg/dL   Creatinine, Ser 0.95 0.50 - 1.35 mg/dL   Calcium 9.3 8.4 - 10.5 mg/dL   GFR calc non Af Amer >90 >90 mL/min   GFR calc Af Amer  >90 >90 mL/min   Anion gap 9 5 - 15  Urinalysis, Routine w reflex microscopic  Result Value Ref Range   Color, Urine AMBER (A) YELLOW   APPearance CLEAR CLEAR   Specific Gravity, Urine >1.030 (H) 1.005 - 1.030   pH 6.0 5.0 - 8.0   Glucose, UA NEGATIVE NEGATIVE mg/dL   Hgb urine dipstick NEGATIVE NEGATIVE   Bilirubin Urine SMALL (  A) NEGATIVE   Ketones, ur 15 (A) NEGATIVE mg/dL   Protein, ur 30 (A) NEGATIVE mg/dL   Urobilinogen, UA 0.2 0.0 - 1.0 mg/dL   Nitrite NEGATIVE NEGATIVE   Leukocytes, UA NEGATIVE NEGATIVE  Urine microscopic-add on  Result Value Ref Range   Squamous Epithelial / LPF RARE RARE   WBC, UA 0-2 <3 WBC/hpf   Casts HYALINE CASTS (A) NEGATIVE  CBG monitoring, ED  Result Value Ref Range   Glucose-Capillary 337 (H) 70 - 99 mg/dL   Ct Abdomen Pelvis W Contrast  12/08/2014   CLINICAL DATA:  49 year old with lower abdominal pain for 1 year. Pain has been worse for 4 days. Patient has an elevated white blood cell count.  EXAM: CT ABDOMEN AND PELVIS WITH CONTRAST  TECHNIQUE: Multidetector CT imaging of the abdomen and pelvis was performed using the standard protocol following bolus administration of intravenous contrast.  CONTRAST:  155mL OMNIPAQUE IOHEXOL 300 MG/ML  SOLN  COMPARISON:  None.  FINDINGS: The lung bases are clear.  0.6 cm hypodensity at the hepatic dome is nonspecific but probably represents a small cyst. No suspicious liver lesions. Normal appearance of the gallbladder and the portal venous system is patent. Normal appearance of the pancreas and spleen and both adrenal glands. Mild stranding around both kidneys which could be chronic. No hydronephrosis. 3.1 cm exophytic low-density cyst in the left kidney mid pole region. There is indeterminate 1.1 cm low-density lesion along the anterior left kidney. This anterior low-density structure has Hounsfield units of roughly 29 which are nonspecific.  Few calcifications in the prostate. Normal appearance of the urinary  bladder. There is wall thickening and inflammatory changes around the sigmoid colon. Small foci of extraluminal gas along the posterior sigmoid colon on sequence 2, image 69. Findings suggest a small perforation without an abscess. Normal appearance of the appendix. Small lymph nodes in the mesentery on sequence 2, image 53 could be reactive.  Evidence for healing fractures involving the left tenth and ninth ribs. Disc space narrowing with vacuum disc at L5-S1. No acute bone abnormality.  IMPRESSION: Sigmoid colon wall thickening with adjacent extraluminal gas. Findings are compatible with acute colitis with a perforation. Findings could represent acute diverticulitis. No evidence for an abscess collection.  Probable left renal cysts. However, the 1.1 cm structure along the anterior left kidney is indeterminate based on the Hounsfield units. This could be more definitively evaluated with an MRI. Alternatively, it may be large enough to be evaluated with ultrasound.  These results were called by telephone at the time of interpretation on 12/08/2014 at 3:46 pm to Dr. Roderic Palau , who verbally acknowledged these results.   Electronically Signed   By: Markus Daft M.D.   On: 12/08/2014 15:48      MDM   Final diagnoses:  Acute colitis    CT shows small perforation next to sigmoid colon, likely diverticulitis. Has guarding but no peritonitis. No abscess. Pain controlled in ED. Is HDS, given perforation will need IV antibiotics and monitoring. D/w surgery, Dr. Arnoldo Morale, who will evaluate patient, hospitalist to admit.  I personally performed the services described in this documentation, which was scribed in my presence. The recorded information has been reviewed and is accurate.    Ephraim Hamburger, MD 12/08/14 [phone]

## 2014-12-08 NOTE — ED Notes (Signed)
Pt states he was sent by Dr Samella Parr office, pt co lower abdominal pain for past year with recent flare up starting Tuesday. Pt states his PCP called and told him his WBC was elevated and to go to the ER.

## 2014-12-08 NOTE — Progress Notes (Signed)
   Subjective:    Patient ID: [name], male    DOB: 1966/04/01, 49 y.o.   MRN: [id number]  HPI  WBC ~22.  Call patient urgently this morning. Patient states his pain is severe, it is 8 on a scale of 10.  The pain is constant and still located in the center and lower part of his abdomen. I did get prior approval for a CT scan of the abdomen and pelvis at [facility] but given the severity of his pain, I  recommended he go to the emergency room immediately as I'm concerned about an acute abdomen. Patient states he will head directly to the emergency room for further evaluation.    Review of Systems     Objective:   Physical Exam        Assessment & Plan:

## 2014-12-08 NOTE — ED Notes (Signed)
MD at bedside. 

## 2014-12-08 NOTE — H&P (Signed)
[facility] History and Physical  [name] IHK:[id number] DOB: [birth year] DOA: 12/08/2014  Referring physician: ER PCP: Odette Fraction, MD   Chief Complaint: Abdominal pain  HPI: [name] is a 50 y.o. male  This is a 49 year old man who gives approximately a two-month history of lower abdominal pain. It was of gradual onset but for the last 3-4 days has become worse. He went to see his primary care physician who sent him for blood work and he was found to have an elevated white blood cell count. He was therefore sent to the emergency room for further evaluation. The patient denies any nausea, vomiting, diarrhea, fever with the abdominal pain. He has had a reasonable appetite. He does describe polyuria, polydipsia in the last few months. He denies any constipation. Evaluation in the emergency room showed that he appears to have sigmoid colitis with what appears to be a microperforation and the findings could represent  acute diverticulitis. He is also found to have a random blood glucose of 337. He is not known to be diabetic. He is now being admitted for further management.   Review of Systems:  Apart from symptoms above, all other systems negative.  Past Medical History  Diagnosis Date  . Allergy   . Hypertension   . GAD (generalized anxiety disorder)    Past Surgical History  Procedure Laterality Date  . Rotator cuff repair    . Hernia repair    . Hemorrhoid surgery     Social History:  reports that he has been smoking Cigarettes.  He has been smoking about 1.00 pack per day. He does not have any smokeless tobacco history on file. He reports that he drinks alcohol. He reports that he does not use illicit drugs.  No Known Allergies  Family History  Problem Relation Age of Onset  . Stroke and diabetes  Mother   . CAD Father      Prior to Admission medications   Medication Sig Start Date End Date Taking? Authorizing Provider    Aspirin-Salicylamide-Caffeine (BC HEADACHE POWDER PO) Take 1 packet by mouth daily as needed. For pain   Yes Historical Provider, MD  atenolol (TENORMIN) 50 MG tablet TAKE 1 TABLET BY MOUTH EVERY DAY 10/25/14  Yes Susy Frizzle, MD  clonazePAM (KLONOPIN) 0.5 MG tablet TAKE 1 TABLET BY MOUTH TWICE A DAY AS NEEDED - MUST LAST 30 DAYS 09/28/14  Yes Susy Frizzle, MD  cyclobenzaprine (FLEXERIL) 10 MG tablet TAKE 1 TABLET BY MOUTH AT BEDTIME AS NEEDED 10/29/13  Yes Susy Frizzle, MD  fluticasone [facility]) 50 MCG/ACT nasal spray Place 2 sprays into both nostrils daily. 03/09/14  Yes Susy Frizzle, MD  simvastatin (ZOCOR) 40 MG tablet TAKE 1 TABLET BY MOUTH EVERY DAY 10/25/14  Yes Susy Frizzle, MD  venlafaxine XR (EFFEXOR-XR) 150 MG 24 hr capsule TAKE 1 CAPSULE (150 MG TOTAL) BY MOUTH EVERY MORNING.   Yes Susy Frizzle, MD  HYDROcodone-acetaminophen (NORCO) 5-325 MG per tablet Take 1-2 tablets by mouth every 6 (six) hours as needed for moderate pain. Patient not taking: Reported on 12/08/2014 08/17/14   Susy Frizzle, MD  ketorolac (TORADOL) 10 MG tablet Take 1 tablet (10 mg total) by mouth every 6 (six) hours as needed. Patient not taking: Reported on 12/08/2014 08/29/14   Susy Frizzle, MD  NASAL SALINE NA Place 1 spray into the nose daily as needed. For nose bleeds    Historical Provider, MD  Physical Exam: Filed Vitals:   12/08/14 1101 12/08/14 1350 12/08/14 1630 12/08/14 1816  BP: 139/96 137/99 125/94 134/81  Pulse: 98 83 73 70  Temp: 98.5 F (36.9 C)   97.7 F (36.5 C)  TempSrc: Oral   Oral  Resp:  16 16 16   Height: 5\' 8"  (1.727 m)     Weight: 90.719 kg (200 lb)     SpO2: 98% 97% 96% 96%    Wt Readings from Last 3 Encounters:  12/08/14 90.719 kg (200 lb)  12/07/14 88.451 kg (195 lb)  08/29/14 87.998 kg (194 lb)    General:  Appears calm and comfortable. Does not appear to be in pain at rest. He is not toxic or septic clinically. He is obese. Eyes: PERRL, normal  lids, irises & conjunctiva ENT: grossly normal hearing, lips & tongue Neck: no LAD, masses or thyromegaly Cardiovascular: RRR, no m/r/g. No LE edema. Telemetry: SR, no arrhythmias  Respiratory: CTA bilaterally, no w/r/r. Normal respiratory effort. Abdomen: Soft, tender in the lower abdomen, more on the left than the right. Bowel sounds are heard. Skin: no rash or induration seen on limited exam Musculoskeletal: grossly normal tone BUE/BLE Psychiatric: grossly normal mood and affect, speech fluent and appropriate Neurologic: grossly non-focal.          Labs on Admission:  Basic Metabolic Panel:  Recent Labs Lab 12/07/14 1202 12/08/14 1228  NA 138 138  K 3.9 3.9  CL 102 103  CO2 25 26  GLUCOSE 102* 137*  BUN 7 11  CREATININE 0.88 0.95  CALCIUM 9.3 9.3   Liver Function Tests:  Recent Labs Lab 12/07/14 1202  AST 17  ALT 18  ALKPHOS 104  BILITOT 0.6  PROT 6.6  ALBUMIN 3.8    Recent Labs Lab 12/07/14 1202  LIPASE <10   No results for input(s): AMMONIA in the last 168 hours. CBC:  Recent Labs Lab 12/07/14 1202 12/08/14 1228  WBC 21.8* 14.2*  NEUTROABS 16.4* 10.3*  HGB 17.0 15.5  HCT 49.4 48.2  MCV 92.9 97.0  PLT 288 246   Cardiac Enzymes: No results for input(s): CKTOTAL, CKMB, CKMBINDEX, TROPONINI in the last 168 hours.  BNP (last 3 results) No results for input(s): PROBNP in the last 8760 hours. CBG:  Recent Labs Lab 12/08/14 1109  GLUCAP 337*    Radiological Exams on Admission: Ct Abdomen Pelvis W Contrast  12/08/2014   CLINICAL DATA:  49 year old with lower abdominal pain for 1 year. Pain has been worse for 4 days. Patient has an elevated white blood cell count.  EXAM: CT ABDOMEN AND PELVIS WITH CONTRAST  TECHNIQUE: Multidetector CT imaging of the abdomen and pelvis was performed using the standard protocol following bolus administration of intravenous contrast.  CONTRAST:  120mL OMNIPAQUE IOHEXOL 300 MG/ML  SOLN  COMPARISON:  None.  FINDINGS:  The lung bases are clear.  0.6 cm hypodensity at the hepatic dome is nonspecific but probably represents a small cyst. No suspicious liver lesions. Normal appearance of the gallbladder and the portal venous system is patent. Normal appearance of the pancreas and spleen and both adrenal glands. Mild stranding around both kidneys which could be chronic. No hydronephrosis. 3.1 cm exophytic low-density cyst in the left kidney mid pole region. There is indeterminate 1.1 cm low-density lesion along the anterior left kidney. This anterior low-density structure has Hounsfield units of roughly 29 which are nonspecific.  Few calcifications in the prostate. Normal appearance of the urinary bladder. There is wall thickening and  inflammatory changes around the sigmoid colon. Small foci of extraluminal gas along the posterior sigmoid colon on sequence 2, image 69. Findings suggest a small perforation without an abscess. Normal appearance of the appendix. Small lymph nodes in the mesentery on sequence 2, image 53 could be reactive.  Evidence for healing fractures involving the left tenth and ninth ribs. Disc space narrowing with vacuum disc at L5-S1. No acute bone abnormality.  IMPRESSION: Sigmoid colon wall thickening with adjacent extraluminal gas. Findings are compatible with acute colitis with a perforation. Findings could represent acute diverticulitis. No evidence for an abscess collection.  Probable left renal cysts. However, the 1.1 cm structure along the anterior left kidney is indeterminate based on the Hounsfield units. This could be more definitively evaluated with an MRI. Alternatively, it may be large enough to be evaluated with ultrasound.  These results were called by telephone at the time of interpretation on 12/08/2014 at 3:46 pm to Dr. Roderic Palau , who verbally acknowledged these results.   Electronically Signed   By: Markus Daft M.D.   On: 12/08/2014 15:48      Assessment/Plan   1. Acute sigmoid colitis,  probable diverticulitis with microperforation. He will be treated with intravenous antibiotics. Surgery has been consulted and they do not feel he is currently a surgical candidate. Clear fluid diet. 2. Hyperglycemia. He is not known to be diabetic. His random blood glucose is 337. We will check hemoglobin A1c as I think he is likely diabetic. 3. Hypertension, controlled. 4. Tobacco abuse. 5. Obesity.  Further recommendations will depend on patient's hospital progress.   Code Status: Full code.  DVT Prophylaxis: Heparin.  Family Communication: I discussed the plan with the patient at the bedside.   Disposition Plan: Home when medically stable.   Time spent: 60 minutes  [facility] Pager [phone].

## 2014-12-08 NOTE — Progress Notes (Signed)
I contacted [facility] and pt can be seen today as a walkin.

## 2014-12-09 DIAGNOSIS — Z720 Tobacco use: Secondary | ICD-10-CM

## 2014-12-09 DIAGNOSIS — I10 Essential (primary) hypertension: Secondary | ICD-10-CM

## 2014-12-09 DIAGNOSIS — R739 Hyperglycemia, unspecified: Secondary | ICD-10-CM

## 2014-12-09 DIAGNOSIS — K529 Noninfective gastroenteritis and colitis, unspecified: Secondary | ICD-10-CM

## 2014-12-09 LAB — COMPREHENSIVE METABOLIC PANEL
ALBUMIN: 3.1 g/dL — AB (ref 3.5–5.2)
ALK PHOS: 77 U/L (ref 39–117)
ALT: 16 U/L (ref 0–53)
AST: 16 U/L (ref 0–37)
Anion gap: 6 (ref 5–15)
BUN: 8 mg/dL (ref 6–23)
CALCIUM: 8.4 mg/dL (ref 8.4–10.5)
CO2: 26 mmol/L (ref 19–32)
Chloride: 104 mEq/L (ref 96–112)
Creatinine, Ser: 0.85 mg/dL (ref 0.50–1.35)
GFR calc Af Amer: >90 mL/min (ref >90)
GFR calc non Af Amer: >90 mL/min (ref >90)
Glucose, Bld: 140 mg/dL — ABNORMAL HIGH (ref 70–99)
POTASSIUM: 3.4 mmol/L — AB (ref 3.5–5.1)
Sodium: 136 mmol/L (ref 135–145)
TOTAL PROTEIN: 5.6 g/dL — AB (ref 6.0–8.3)
Total Bilirubin: 0.3 mg/dL (ref 0.3–1.2)

## 2014-12-09 LAB — CBC
HEMATOCRIT: 42.7 % (ref 39.0–52.0)
HEMOGLOBIN: 13.9 g/dL (ref 13.0–17.0)
MCH: 31.7 pg (ref 26.0–34.0)
MCHC: 32.6 g/dL (ref 30.0–36.0)
MCV: 97.3 fL (ref 78.0–100.0)
Platelets: 251 10*3/uL (ref 150–400)
RBC: 4.39 MIL/uL (ref 4.22–5.81)
RDW: 13.1 % (ref 11.5–15.5)
WBC: 13.2 10*3/uL — ABNORMAL HIGH (ref 4.0–10.5)

## 2014-12-09 LAB — MAGNESIUM: MAGNESIUM: 1.6 mg/dL (ref 1.5–2.5)

## 2014-12-09 MED ORDER — POTASSIUM CHLORIDE CRYS ER 20 MEQ PO TBCR
40.0000 meq | EXTENDED_RELEASE_TABLET | Freq: Once | ORAL | Status: AC
  Administered 2014-12-09: 40 meq via ORAL
  Filled 2014-12-09: qty 2

## 2014-12-09 MED ORDER — POTASSIUM CHLORIDE 10 MEQ/100ML IV SOLN
10.0000 meq | INTRAVENOUS | Status: AC
  Administered 2014-12-09 (×2): 10 meq via INTRAVENOUS
  Filled 2014-12-09 (×4): qty 100

## 2014-12-09 NOTE — Consult Note (Signed)
Reason for Consult: Sigmoid diverticulitis Referring Physician: Hospitalist  [name] is an 49 y.o. male.  HPI: Patient is a 49 year old white male who was in his usual state of health when he had a several-day history of worsening left lower quadrant abdominal pain. He presented to the emergency room and CT scan of the abdomen revealed sigmoid diverticulitis with contained extraluminal microperforation. He states that this is his first episode. He has never had a colonoscopy. Since admission, he does feel slightly better.  Past Medical History  Diagnosis Date  . Allergy   . Hypertension   . GAD (generalized anxiety disorder)     Past Surgical History  Procedure Laterality Date  . Rotator cuff repair    . Hernia repair    . Hemorrhoid surgery      Family History  Problem Relation Age of Onset  . Stroke Mother   . CAD Father     Social History:  reports that he has been smoking Cigarettes.  He has been smoking about 1.00 pack per day. He does not have any smokeless tobacco history on file. He reports that he drinks alcohol. He reports that he does not use illicit drugs.  Allergies: No Known Allergies  Medications: I have reviewed the patient's current medications.  Results for orders placed or performed during the hospital encounter of 12/08/14 (from the past 48 hour(s))  CBG monitoring, ED     Status: Abnormal   Collection Time: 12/08/14 11:09 AM  Result Value Ref Range   Glucose-Capillary 337 (H) 70 - 99 mg/dL  Urinalysis, Routine w reflex microscopic     Status: Abnormal   Collection Time: 12/08/14 11:26 AM  Result Value Ref Range   Color, Urine AMBER (A) YELLOW    Comment: BIOCHEMICALS MAY BE AFFECTED BY COLOR   APPearance CLEAR CLEAR   Specific Gravity, Urine >1.030 (H) 1.005 - 1.030   pH 6.0 5.0 - 8.0   Glucose, UA NEGATIVE NEGATIVE mg/dL   Hgb urine dipstick NEGATIVE NEGATIVE   Bilirubin Urine SMALL (A) NEGATIVE   Ketones, ur 15 (A) NEGATIVE mg/dL   Protein, ur 30 (A) NEGATIVE mg/dL   Urobilinogen, UA 0.2 0.0 - 1.0 mg/dL   Nitrite NEGATIVE NEGATIVE   Leukocytes, UA NEGATIVE NEGATIVE  Urine microscopic-add on     Status: Abnormal   Collection Time: 12/08/14 11:26 AM  Result Value Ref Range   Squamous Epithelial / LPF RARE RARE   WBC, UA 0-2 <3 WBC/hpf   Casts HYALINE CASTS (A) NEGATIVE  CBC with Differential     Status: Abnormal   Collection Time: 12/08/14 12:28 PM  Result Value Ref Range   WBC 14.2 (H) 4.0 - 10.5 K/uL   RBC 4.97 4.22 - 5.81 MIL/uL   Hemoglobin 15.5 13.0 - 17.0 g/dL   HCT 48.2 39.0 - 52.0 %   MCV 97.0 78.0 - 100.0 fL   MCH 31.2 26.0 - 34.0 pg   MCHC 32.2 30.0 - 36.0 g/dL   RDW 13.4 11.5 - 15.5 %   Platelets 246 150 - 400 K/uL   Neutrophils Relative % 72 43 - 77 %   Neutro Abs 10.3 (H) 1.7 - 7.7 K/uL   Lymphocytes Relative 15 12 - 46 %   Lymphs Abs 2.1 0.7 - 4.0 K/uL   Monocytes Relative 11 3 - 12 %   Monocytes Absolute 1.6 (H) 0.1 - 1.0 K/uL   Eosinophils Relative 2 0 - 5 %   Eosinophils Absolute 0.2 0.0 -  0.7 K/uL   Basophils Relative 0 0 - 1 %   Basophils Absolute 0.0 0.0 - 0.1 K/uL  Basic metabolic panel     Status: Abnormal   Collection Time: 12/08/14 12:28 PM  Result Value Ref Range   Sodium 138 135 - 145 mmol/L    Comment: Please note change in reference range.   Potassium 3.9 3.5 - 5.1 mmol/L    Comment: Please note change in reference range.   Chloride 103 96 - 112 mEq/L   CO2 26 19 - 32 mmol/L   Glucose, Bld 137 (H) 70 - 99 mg/dL   BUN 11 6 - 23 mg/dL   Creatinine, Ser 0.95 0.50 - 1.35 mg/dL   Calcium 9.3 8.4 - 10.5 mg/dL   GFR calc non Af Amer >90 >90 mL/min   GFR calc Af Amer >90 >90 mL/min    Comment: (NOTE) The eGFR has been calculated using the CKD EPI equation. This calculation has not been validated in all clinical situations. eGFR's persistently <90 mL/min signify possible Chronic Kidney Disease.    Anion gap 9 5 - 15  Comprehensive metabolic panel     Status: Abnormal    Collection Time: 12/09/14  6:42 AM  Result Value Ref Range   Sodium 136 135 - 145 mmol/L    Comment: Please note change in reference range.   Potassium 3.4 (L) 3.5 - 5.1 mmol/L    Comment: Please note change in reference range.   Chloride 104 96 - 112 mEq/L   CO2 26 19 - 32 mmol/L   Glucose, Bld 140 (H) 70 - 99 mg/dL   BUN 8 6 - 23 mg/dL   Creatinine, Ser 0.85 0.50 - 1.35 mg/dL   Calcium 8.4 8.4 - 10.5 mg/dL   Total Protein 5.6 (L) 6.0 - 8.3 g/dL   Albumin 3.1 (L) 3.5 - 5.2 g/dL   AST 16 0 - 37 U/L   ALT 16 0 - 53 U/L   Alkaline Phosphatase 77 39 - 117 U/L   Total Bilirubin 0.3 0.3 - 1.2 mg/dL   GFR calc non Af Amer >90 >90 mL/min   GFR calc Af Amer >90 >90 mL/min    Comment: (NOTE) The eGFR has been calculated using the CKD EPI equation. This calculation has not been validated in all clinical situations. eGFR's persistently <90 mL/min signify possible Chronic Kidney Disease.    Anion gap 6 5 - 15  CBC     Status: Abnormal   Collection Time: 12/09/14  6:42 AM  Result Value Ref Range   WBC 13.2 (H) 4.0 - 10.5 K/uL   RBC 4.39 4.22 - 5.81 MIL/uL   Hemoglobin 13.9 13.0 - 17.0 g/dL   HCT 42.7 39.0 - 52.0 %   MCV 97.3 78.0 - 100.0 fL   MCH 31.7 26.0 - 34.0 pg   MCHC 32.6 30.0 - 36.0 g/dL   RDW 13.1 11.5 - 15.5 %   Platelets 251 150 - 400 K/uL  Magnesium     Status: None   Collection Time: 12/09/14  6:42 AM  Result Value Ref Range   Magnesium 1.6 1.5 - 2.5 mg/dL    Ct Abdomen Pelvis W Contrast  12/08/2014   CLINICAL DATA:  49 year old with lower abdominal pain for 1 year. Pain has been worse for 4 days. Patient has an elevated white blood cell count.  EXAM: CT ABDOMEN AND PELVIS WITH CONTRAST  TECHNIQUE: Multidetector CT imaging of the abdomen and pelvis was performed using  the standard protocol following bolus administration of intravenous contrast.  CONTRAST:  17m OMNIPAQUE IOHEXOL 300 MG/ML  SOLN  COMPARISON:  None.  FINDINGS: The lung bases are clear.  0.6 cm hypodensity  at the hepatic dome is nonspecific but probably represents a small cyst. No suspicious liver lesions. Normal appearance of the gallbladder and the portal venous system is patent. Normal appearance of the pancreas and spleen and both adrenal glands. Mild stranding around both kidneys which could be chronic. No hydronephrosis. 3.1 cm exophytic low-density cyst in the left kidney mid pole region. There is indeterminate 1.1 cm low-density lesion along the anterior left kidney. This anterior low-density structure has Hounsfield units of roughly 29 which are nonspecific.  Few calcifications in the prostate. Normal appearance of the urinary bladder. There is wall thickening and inflammatory changes around the sigmoid colon. Small foci of extraluminal gas along the posterior sigmoid colon on sequence 2, image 69. Findings suggest a small perforation without an abscess. Normal appearance of the appendix. Small lymph nodes in the mesentery on sequence 2, image 53 could be reactive.  Evidence for healing fractures involving the left tenth and ninth ribs. Disc space narrowing with vacuum disc at L5-S1. No acute bone abnormality.  IMPRESSION: Sigmoid colon wall thickening with adjacent extraluminal gas. Findings are compatible with acute colitis with a perforation. Findings could represent acute diverticulitis. No evidence for an abscess collection.  Probable left renal cysts. However, the 1.1 cm structure along the anterior left kidney is indeterminate based on the Hounsfield units. This could be more definitively evaluated with an MRI. Alternatively, it may be large enough to be evaluated with ultrasound.  These results were called by telephone at the time of interpretation on 12/08/2014 at 3:46 pm to Dr. ZRoderic Palau, who verbally acknowledged these results.   Electronically Signed   By: AMarkus DaftM.D.   On: 12/08/2014 15:48    ROS: See chart Blood pressure 128/81, pulse 84, temperature 98.8 F (37.1 C), temperature source  Oral, resp. rate 17, height _0  (1.727 m), weight 88.451 kg (195 lb), SpO2 98 %. Physical Exam: Pleasant white male in no acute distress. Abdomen soft with mild tenderness to deep palpation in the left lower quadrant. No rigidity noted.  Assessment/Plan: Impression: Sigmoid diverticulitis with microperforation. No need for surgical intervention at this time. Plan: Would continue IV antibiotics and bowel rest. Clear liquid diet is fine at this point as patient is improving. Would continue this for a few days. Patient has been told he will need a colonoscopy in several months as an outpatient.  [name] A 12/09/2014, 11:26 AM

## 2014-12-09 NOTE — Progress Notes (Signed)
0948 Dropped rate of IV K+ to 64mL/hr due to patient c/o IV irritation at 132mL/hr. Tolerating well at 72mL/hr at this time.

## 2014-12-09 NOTE — Progress Notes (Signed)
[facility] PROGRESS NOTE  SAMIEL PEEL YJE:[id number] DOB: [birth year] DOA: 12/08/2014  PCP: Odette Fraction, MD  Brief HPI: 49 year old male with a history of hypertension, presents with lower abdominal pain. It got worse over the last few days. This had been ongoing for longer than that. He was found to have sigmoid colitis versus diverticulitis with perforation. He was admitted to the hospital for further management.  Past medical history:  Past Medical History  Diagnosis Date  . Allergy   . Hypertension   . GAD (generalized anxiety disorder)     Consultants: Gen Surgery  Procedures: None  Antibiotics: Cipro and Flagyl 1/15  Subjective: Patient feels slightly better this morning. Still has pain, which is 5 out of 10 in intensity in the lower abdomen. Denies any nausea, vomiting. Had a regular bowel movement this morning. Denies any history of diarrhea recently. No fever, no chills. Denies any history of constipation. Denies ever having had a colonoscopy.  Objective: Vital Signs  Filed Vitals:   12/08/14 1630 12/08/14 1816 12/08/14 2038 12/09/14 0502  BP: 125/94 134/81 144/86 138/82  Pulse: 73 70 74 81  Temp:  97.7 F (36.5 C) 98.7 F (37.1 C) 98.7 F (37.1 C)  TempSrc:  Oral Oral Oral  Resp: 16 16 16 18   Height:  5\' 8"  (1.727 m)    Weight:  88.451 kg (195 lb)    SpO2: 96% 96% 94% 96%    Intake/Output Summary (Last 24 hours) at 12/09/14 0759 Last data filed at 12/09/14 0542  Gross per 24 hour  Intake 1386.67 ml  Output      0 ml  Net 1386.67 ml   Filed Weights   12/08/14 1101 12/08/14 1816  Weight: 90.719 kg (200 lb) 88.451 kg (195 lb)    General appearance: alert, cooperative, appears stated age and no distress Resp: clear to auscultation bilaterally Cardio: regular rate and rhythm, S1, S2 normal, no murmur, click, rub or gallop GI: Abdomen is soft. Tender in the lower areas bilaterally. No rebound, rigidity or guarding. Bowel sounds are  present. No masses or organomegaly. Extremities: extremities normal, atraumatic, no cyanosis or edema Neurologic: Alert and oriented 3. No focal neurological deficits are present.  Lab Results:  Basic Metabolic Panel:  Recent Labs Lab 12/07/14 1202 12/08/14 1228 12/09/14 0642  NA 138 138 136  K 3.9 3.9 3.4*  CL 102 103 104  CO2 25 26 26   GLUCOSE 102* 137* 140*  BUN 7 11 8   CREATININE 0.88 0.95 0.85  CALCIUM 9.3 9.3 8.4   Liver Function Tests:  Recent Labs Lab 12/07/14 1202 12/09/14 0642  AST 17 16  ALT 18 16  ALKPHOS 104 77  BILITOT 0.6 0.3  PROT 6.6 5.6*  ALBUMIN 3.8 3.1*    Recent Labs Lab 12/07/14 1202  LIPASE <10   CBC:  Recent Labs Lab 12/07/14 1202 12/08/14 1228 12/09/14 0642  WBC 21.8* 14.2* 13.2*  NEUTROABS 16.4* 10.3*  --   HGB 17.0 15.5 13.9  HCT 49.4 48.2 42.7  MCV 92.9 97.0 97.3  PLT 288 246 251   CBG:  Recent Labs Lab 12/08/14 1109  GLUCAP 337*    Studies/Results: Ct Abdomen Pelvis W Contrast  12/08/2014   CLINICAL DATA:  49 year old with lower abdominal pain for 1 year. Pain has been worse for 4 days. Patient has an elevated white blood cell count.  EXAM: CT ABDOMEN AND PELVIS WITH CONTRAST  TECHNIQUE: Multidetector CT imaging of the abdomen and pelvis was  performed using the standard protocol following bolus administration of intravenous contrast.  CONTRAST:  145mL OMNIPAQUE IOHEXOL 300 MG/ML  SOLN  COMPARISON:  None.  FINDINGS: The lung bases are clear.  0.6 cm hypodensity at the hepatic dome is nonspecific but probably represents a small cyst. No suspicious liver lesions. Normal appearance of the gallbladder and the portal venous system is patent. Normal appearance of the pancreas and spleen and both adrenal glands. Mild stranding around both kidneys which could be chronic. No hydronephrosis. 3.1 cm exophytic low-density cyst in the left kidney mid pole region. There is indeterminate 1.1 cm low-density lesion along the anterior left  kidney. This anterior low-density structure has Hounsfield units of roughly 29 which are nonspecific.  Few calcifications in the prostate. Normal appearance of the urinary bladder. There is wall thickening and inflammatory changes around the sigmoid colon. Small foci of extraluminal gas along the posterior sigmoid colon on sequence 2, image 69. Findings suggest a small perforation without an abscess. Normal appearance of the appendix. Small lymph nodes in the mesentery on sequence 2, image 53 could be reactive.  Evidence for healing fractures involving the left tenth and ninth ribs. Disc space narrowing with vacuum disc at L5-S1. No acute bone abnormality.  IMPRESSION: Sigmoid colon wall thickening with adjacent extraluminal gas. Findings are compatible with acute colitis with a perforation. Findings could represent acute diverticulitis. No evidence for an abscess collection.  Probable left renal cysts. However, the 1.1 cm structure along the anterior left kidney is indeterminate based on the Hounsfield units. This could be more definitively evaluated with an MRI. Alternatively, it may be large enough to be evaluated with ultrasound.  These results were called by telephone at the time of interpretation on 12/08/2014 at 3:46 pm to Dr. Roderic Palau , who verbally acknowledged these results.   Electronically Signed   By: Markus Daft M.D.   On: 12/08/2014 15:48    Medications:  Scheduled: . atenolol  50 mg Oral Daily  . atorvastatin  20 mg Oral q1800  . ciprofloxacin  400 mg Intravenous Once  . ciprofloxacin  400 mg Intravenous Q12H  . fluticasone  2 spray Each Nare Daily  . heparin  5,000 Units Subcutaneous 3 times per day  . metronidazole  500 mg Intravenous Q8H  . pneumococcal 23 valent vaccine  0.5 mL Intramuscular Tomorrow-1000  . venlafaxine XR  150 mg Oral Q breakfast   Continuous: . sodium chloride 100 mL/hr at 12/08/14 2200   BSW:[id number], morphine injection, ondansetron **OR** ondansetron (ZOFRAN)  IV  Assessment/Plan:  Active Problems:   Essential hypertension   Tobacco abuse   Acute colitis   Hyperglycemia   Obesity   Colitis    Acute sigmoid colitis versus diverticulitis with perforation Patient is stable currently. Continue ciprofloxacin and Flagyl. Await surgery consultation. Pain control. IV fluids. Might be okay to leave him on clear liquids for now. Will defer to general surgery. Mobilize as tolerated. He will eventually need a colonoscopy in the next 4-6 weeks. Replace potassium  History of essential hypertension Blood pressure is reasonably well controlled. Continue current medications.  History of hyperlipidemia. Continue with his statin  Hyperglycemia Blood glucose of 337 was detected on CBG testing. Subsequent blood sugars have been reasonably well controlled. That was likely an erroneous reading. HbA1c is pending.  History of tobacco abuse. Counseling provided.  DVT Prophylaxis: Heparin    Code Status: Full code  Family Communication: No family at bedside. Discussed in detail with the patient  Disposition Plan: Not ready for discharge. Mobilize.    LOS: 1 day   [facility] Pager [phone] 12/09/2014, 7:59 AM  If 7PM-7AM, please contact night-coverage at [email or web address], password [facility]

## 2014-12-10 DIAGNOSIS — K5792 Diverticulitis of intestine, part unspecified, without perforation or abscess without bleeding: Secondary | ICD-10-CM

## 2014-12-10 LAB — CBC
HCT: 45.1 % (ref 39.0–52.0)
Hemoglobin: 14.6 g/dL (ref 13.0–17.0)
MCH: 31.4 pg (ref 26.0–34.0)
MCHC: 32.4 g/dL (ref 30.0–36.0)
MCV: 97 fL (ref 78.0–100.0)
Platelets: 264 10*3/uL (ref 150–400)
RBC: 4.65 MIL/uL (ref 4.22–5.81)
RDW: 13.1 % (ref 11.5–15.5)
WBC: 11.6 10*3/uL — ABNORMAL HIGH (ref 4.0–10.5)

## 2014-12-10 LAB — BASIC METABOLIC PANEL
Anion gap: 7 (ref 5–15)
BUN: 7 mg/dL (ref 6–23)
CALCIUM: 8.7 mg/dL (ref 8.4–10.5)
CHLORIDE: 103 meq/L (ref 96–112)
CO2: 27 mmol/L (ref 19–32)
Creatinine, Ser: 0.86 mg/dL (ref 0.50–1.35)
GFR calc Af Amer: >90 mL/min (ref >90)
GLUCOSE: 120 mg/dL — AB (ref 70–99)
Potassium: 3.5 mmol/L (ref 3.5–5.1)
SODIUM: 137 mmol/L (ref 135–145)

## 2014-12-10 LAB — HEMOGLOBIN A1C
Hgb A1c MFr Bld: 5.8 % — ABNORMAL HIGH (ref <5.7)
Mean Plasma Glucose: 120 mg/dL — ABNORMAL HIGH (ref <117)

## 2014-12-10 MED ORDER — POTASSIUM CHLORIDE CRYS ER 20 MEQ PO TBCR
40.0000 meq | EXTENDED_RELEASE_TABLET | Freq: Once | ORAL | Status: AC
  Administered 2014-12-10: 40 meq via ORAL
  Filled 2014-12-10: qty 2

## 2014-12-10 MED ORDER — OXYCODONE HCL 5 MG PO TABS
5.0000 mg | ORAL_TABLET | Freq: Four times a day (QID) | ORAL | Status: DC | PRN
  Administered 2014-12-10 – 2014-12-11 (×2): 5 mg via ORAL
  Filled 2014-12-10 (×3): qty 1

## 2014-12-10 MED ORDER — MORPHINE SULFATE 2 MG/ML IJ SOLN
2.0000 mg | INTRAMUSCULAR | Status: DC | PRN
  Administered 2014-12-10 (×2): 2 mg via INTRAVENOUS
  Filled 2014-12-10 (×2): qty 1

## 2014-12-10 NOTE — Progress Notes (Signed)
Subjective: Patient has had multiple bowel movements over the past 24 hours. His abdominal pain has significantly decreased.  Objective: Vital signs in last 24 hours: Temp:  [98.4 F (36.9 C)-98.7 F (37.1 C)] 98.4 F (36.9 C) (01/17 0610) Pulse Rate:  [67-78] 71 (01/17 0610) Resp:  [14-18] 18 (01/17 0610) BP: (139-152)/(83-87) 139/83 mmHg (01/17 0610) SpO2:  [96 %-100 %] 98 % (01/17 0610) Last BM Date: 12/10/14  Intake/Output from previous day: 01/16 0701 - 01/17 0700 In: 2156.3 [P.O.:480; I.V.:1176.3; IV Piggyback:500] Out: -  Intake/Output this shift: Total I/O In: 240 [P.O.:240] Out: -   General appearance: alert, cooperative and no distress GI: Soft with minimal tenderness in left lower quadrant to deep palpation. No rigidity noted. Bowel sounds active.  Lab Results:   Recent Labs  12/09/14 0642 12/10/14 0624  WBC 13.2* 11.6*  HGB 13.9 14.6  HCT 42.7 45.1  PLT 251 264   BMET  Recent Labs  12/09/14 0642 12/10/14 0624  NA 136 137  K 3.4* 3.5  CL 104 103  CO2 26 27  GLUCOSE 140* 120*  BUN 8 7  CREATININE 0.85 0.86  CALCIUM 8.4 8.7   PT/INR No results for input(s): LABPROT, INR in the last 72 hours.  Studies/Results: Ct Abdomen Pelvis W Contrast  12/08/2014   CLINICAL DATA:  49 year old with lower abdominal pain for 1 year. Pain has been worse for 4 days. Patient has an elevated white blood cell count.  EXAM: CT ABDOMEN AND PELVIS WITH CONTRAST  TECHNIQUE: Multidetector CT imaging of the abdomen and pelvis was performed using the standard protocol following bolus administration of intravenous contrast.  CONTRAST:  140mL OMNIPAQUE IOHEXOL 300 MG/ML  SOLN  COMPARISON:  None.  FINDINGS: The lung bases are clear.  0.6 cm hypodensity at the hepatic dome is nonspecific but probably represents a small cyst. No suspicious liver lesions. Normal appearance of the gallbladder and the portal venous system is patent. Normal appearance of the pancreas and spleen and  both adrenal glands. Mild stranding around both kidneys which could be chronic. No hydronephrosis. 3.1 cm exophytic low-density cyst in the left kidney mid pole region. There is indeterminate 1.1 cm low-density lesion along the anterior left kidney. This anterior low-density structure has Hounsfield units of roughly 29 which are nonspecific.  Few calcifications in the prostate. Normal appearance of the urinary bladder. There is wall thickening and inflammatory changes around the sigmoid colon. Small foci of extraluminal gas along the posterior sigmoid colon on sequence 2, image 69. Findings suggest a small perforation without an abscess. Normal appearance of the appendix. Small lymph nodes in the mesentery on sequence 2, image 53 could be reactive.  Evidence for healing fractures involving the left tenth and ninth ribs. Disc space narrowing with vacuum disc at L5-S1. No acute bone abnormality.  IMPRESSION: Sigmoid colon wall thickening with adjacent extraluminal gas. Findings are compatible with acute colitis with a perforation. Findings could represent acute diverticulitis. No evidence for an abscess collection.  Probable left renal cysts. However, the 1.1 cm structure along the anterior left kidney is indeterminate based on the Hounsfield units. This could be more definitively evaluated with an MRI. Alternatively, it may be large enough to be evaluated with ultrasound.  These results were called by telephone at the time of interpretation on 12/08/2014 at 3:46 pm to Dr. Roderic Palau , who verbally acknowledged these results.   Electronically Signed   By: Markus Daft M.D.   On: 12/08/2014 15:48  Anti-infectives: Anti-infectives    Start     Dose/Rate Route Frequency Ordered Stop   12/08/14 1830  ciprofloxacin (CIPRO) IVPB 400 mg     400 mg200 mL/hr over 60 Minutes Intravenous Every 12 hours 12/08/14 1817     12/08/14 1830  metroNIDAZOLE (FLAGYL) IVPB 500 mg     500 mg100 mL/hr over 60 Minutes Intravenous Every 8  hours 12/08/14 1818     12/08/14 1600  ciprofloxacin (CIPRO) IVPB 400 mg     400 mg200 mL/hr over 60 Minutes Intravenous  Once 12/08/14 1551     12/08/14 1600  metroNIDAZOLE (FLAGYL) IVPB 500 mg     500 mg100 mL/hr over 60 Minutes Intravenous  Once 12/08/14 1551 12/08/14 1718      Assessment/Plan: Impression: Sigmoid diverticulitis, resolving Plan: We'll advance to full liquid diet. Continue IV antibiotics. Hopefully can be discharged in next 24-48 hours.  LOS: 2 days    Adair Lauderback A 12/10/2014

## 2014-12-10 NOTE — Progress Notes (Signed)
[facility] PROGRESS NOTE  ATHENS LEBEAU DJM:[id number] DOB: [birth year] DOA: 12/08/2014  PCP: Odette Fraction, MD  Brief HPI: 49 year old male with a history of hypertension, presents with lower abdominal pain. It got worse over the last few days. This had been ongoing for longer than that. He was found to have sigmoid colitis versus diverticulitis with perforation. He was admitted to the hospital for further management.  Past medical history:  Past Medical History  Diagnosis Date  . Allergy   . Hypertension   . GAD (generalized anxiety disorder)     Consultants: Gen Surgery  Procedures: None  Antibiotics: Cipro and Flagyl 1/15  Subjective: Patient feels better. Pain is much improved. None as long as he is not pushing on his abdomen. Denies any nausea, vomiting. Having BM's.   Objective: Vital Signs  Filed Vitals:   12/09/14 0921 12/09/14 1401 12/09/14 2235 12/10/14 0610  BP: 128/81 152/87 148/84 139/83  Pulse: 84 67 78 71  Temp: 98.8 F (37.1 C) 98.5 F (36.9 C) 98.7 F (37.1 C) 98.4 F (36.9 C)  TempSrc: Oral Oral Oral Oral  Resp: 17 14 16 18   Height:      Weight:      SpO2: 98% 100% 96% 98%    Intake/Output Summary (Last 24 hours) at 12/10/14 0748 Last data filed at 12/10/14 0551  Gross per 24 hour  Intake 2156.25 ml  Output      0 ml  Net 2156.25 ml   Filed Weights   12/08/14 1101 12/08/14 1816  Weight: 90.719 kg (200 lb) 88.451 kg (195 lb)    General appearance: alert, cooperative, appears stated age and no distress Resp: clear to auscultation bilaterally Cardio: regular rate and rhythm, S1, S2 normal, no murmur, click, rub or gallop GI: Abdomen is soft. No tenderness today. Bowel sounds are present. No masses or organomegaly. Extremities: extremities normal, atraumatic, no cyanosis or edema Neurologic: Alert and oriented 3. No focal neurological deficits are present.  Lab Results:  Basic Metabolic Panel:  Recent Labs Lab  12/07/14 1202 12/08/14 1228 12/09/14 0642 12/10/14 0624  NA 138 138 136 137  K 3.9 3.9 3.4* 3.5  CL 102 103 104 103  CO2 25 26 26 27   GLUCOSE 102* 137* 140* 120*  BUN 7 11 8 7   CREATININE 0.88 0.95 0.85 0.86  CALCIUM 9.3 9.3 8.4 8.7  MG  --   --  1.6  --    Liver Function Tests:  Recent Labs Lab 12/07/14 1202 12/09/14 0642  AST 17 16  ALT 18 16  ALKPHOS 104 77  BILITOT 0.6 0.3  PROT 6.6 5.6*  ALBUMIN 3.8 3.1*    Recent Labs Lab 12/07/14 1202  LIPASE <10   CBC:  Recent Labs Lab 12/07/14 1202 12/08/14 1228 12/09/14 0642 12/10/14 0624  WBC 21.8* 14.2* 13.2* 11.6*  NEUTROABS 16.4* 10.3*  --   --   HGB 17.0 15.5 13.9 14.6  HCT 49.4 48.2 42.7 45.1  MCV 92.9 97.0 97.3 97.0  PLT 288 246 251 264   CBG:  Recent Labs Lab 12/08/14 1109  GLUCAP 337*    Studies/Results: Ct Abdomen Pelvis W Contrast  12/08/2014   CLINICAL DATA:  49 year old with lower abdominal pain for 1 year. Pain has been worse for 4 days. Patient has an elevated white blood cell count.  EXAM: CT ABDOMEN AND PELVIS WITH CONTRAST  TECHNIQUE: Multidetector CT imaging of the abdomen and pelvis was performed using the standard protocol following bolus  administration of intravenous contrast.  CONTRAST:  145mL OMNIPAQUE IOHEXOL 300 MG/ML  SOLN  COMPARISON:  None.  FINDINGS: The lung bases are clear.  0.6 cm hypodensity at the hepatic dome is nonspecific but probably represents a small cyst. No suspicious liver lesions. Normal appearance of the gallbladder and the portal venous system is patent. Normal appearance of the pancreas and spleen and both adrenal glands. Mild stranding around both kidneys which could be chronic. No hydronephrosis. 3.1 cm exophytic low-density cyst in the left kidney mid pole region. There is indeterminate 1.1 cm low-density lesion along the anterior left kidney. This anterior low-density structure has Hounsfield units of roughly 29 which are nonspecific.  Few calcifications in the  prostate. Normal appearance of the urinary bladder. There is wall thickening and inflammatory changes around the sigmoid colon. Small foci of extraluminal gas along the posterior sigmoid colon on sequence 2, image 69. Findings suggest a small perforation without an abscess. Normal appearance of the appendix. Small lymph nodes in the mesentery on sequence 2, image 53 could be reactive.  Evidence for healing fractures involving the left tenth and ninth ribs. Disc space narrowing with vacuum disc at L5-S1. No acute bone abnormality.  IMPRESSION: Sigmoid colon wall thickening with adjacent extraluminal gas. Findings are compatible with acute colitis with a perforation. Findings could represent acute diverticulitis. No evidence for an abscess collection.  Probable left renal cysts. However, the 1.1 cm structure along the anterior left kidney is indeterminate based on the Hounsfield units. This could be more definitively evaluated with an MRI. Alternatively, it may be large enough to be evaluated with ultrasound.  These results were called by telephone at the time of interpretation on 12/08/2014 at 3:46 pm to Dr. Roderic Palau , who verbally acknowledged these results.   Electronically Signed   By: Markus Daft M.D.   On: 12/08/2014 15:48    Medications:  Scheduled: . atenolol  50 mg Oral Daily  . atorvastatin  20 mg Oral q1800  . ciprofloxacin  400 mg Intravenous Once  . ciprofloxacin  400 mg Intravenous Q12H  . fluticasone  2 spray Each Nare Daily  . heparin  5,000 Units Subcutaneous 3 times per day  . metronidazole  500 mg Intravenous Q8H  . venlafaxine XR  150 mg Oral Q breakfast   Continuous: . sodium chloride 75 mL/hr at 12/09/14 1410   XAJ:OINOMVEHMC, morphine injection, ondansetron **OR** ondansetron (ZOFRAN) IV  Assessment/Plan:  Active Problems:   Essential hypertension   Tobacco abuse   Acute colitis   Hyperglycemia   Obesity   Colitis    Acute sigmoid colitis versus diverticulitis with  perforation Patient is improving. Continue ciprofloxacin and Flagyl. Appreciate surgery input. Defer advancement of diet to Dr. Arnoldo Morale. Pain control PRN. IV fluids. Continue to mobilize as tolerated. He will eventually need a colonoscopy in the next 4-6 weeks.   History of essential hypertension Blood pressure is reasonably well controlled. Continue current medications.  History of hyperlipidemia. Continue with his statin  Hyperglycemia, erroneous reading Blood glucose of 337 was detected on CBG testing. Subsequent blood sugars have been reasonably well controlled. That was likely an erroneous reading. HbA1c is 5.8.  History of tobacco abuse. Counseling was provided.  DVT Prophylaxis: Heparin    Code Status: Full code  Family Communication: Discussed in detail with the patient and his wife Disposition Plan: Hopefully home in 1-2 days.    LOS: 2 days   [facility] Pager [phone] 12/10/2014, 7:48 AM  If 7PM-7AM, please contact night-coverage at [email or web address], password [facility]

## 2014-12-11 MED ORDER — CIPROFLOXACIN HCL 500 MG PO TABS: 500.0000 mg | ORAL_TABLET | Freq: Two times a day (BID) | ORAL | Status: DC

## 2014-12-11 MED ORDER — METRONIDAZOLE 500 MG PO TABS: 500.0000 mg | ORAL_TABLET | Freq: Three times a day (TID) | ORAL | Status: DC

## 2014-12-11 NOTE — Discharge Instructions (Signed)

## 2014-12-11 NOTE — Progress Notes (Signed)
Patient d/c home this shift. Hard Rx's and d/c instructions given to patient. Patient gathered his belongings and confirmed he had everything he brought with him. IV catheter removed from LEFT forearm, catheter tip intact, no s/s of infection noted. Patient left facility ambulatory with wife.

## 2014-12-11 NOTE — Discharge Summary (Signed)
Physician Discharge Summary  CAMP GOPAL WPY:[id number] DOB: May 31, 1966 DOA: 12/08/2014  PCP: Odette Fraction, MD  Admit date: 12/08/2014 Discharge date: 12/11/2014  Time spent: 40 minutes  Recommendations for Outpatient Follow-up:  1. Follow-up with primary care physician a 1-2 weeks 2. Consider outpatient abdominal MRI/ultrasound for evaluation of left kidney cyst  Discharge Diagnoses:  Active Problems:   Essential hypertension   Tobacco abuse   Acute colitis   Hyperglycemia   Obesity   Colitis   Discharge Condition: improved  Diet recommendation: low salt  Filed Weights   12/08/14 1101 12/08/14 1816  Weight: 90.719 kg (200 lb) 88.451 kg (195 lb)    History of present illness:  This is a 49 year old male with history of hypertension who presents to the hospital with lower abdominal pain. He reports the pain got worse over the last few days. He was found to have sigmoid colitis versus diverticulitis with perforation. He was admitted to the hospital for further evaluation.  Hospital Course:  Patient was treated with intravenous antibiotics and bowel rest. He was followed by general surgery during his hospital stay. Patient clinically improved with intravenous antibiotics and his leukocytosis is also improved. His diet was slowly advanced to full liquids which he has tolerated well. His abdominal Pain has completely resolved and he has not had any vomiting. He's been cleared for discharge by general surgery and will follow-up with his primary care physician in the next 1-2 weeks. A complete total of 10 days of oral antibiotics. Currently, he is afebrile and his leukocytosis is trending down.  Procedures:    Consultations:  General surgery  Discharge Exam: Filed Vitals:   12/11/14 1401  BP: 144/93  Pulse: 66  Temp: 98.2 F (36.8 C)  Resp: 18    General: NAD Cardiovascular: S1, S2 RRR Respiratory: CTA B Abd: soft, nt, nd, bs+  Discharge  Instructions   Discharge Instructions    Call MD for:  persistant nausea and vomiting    Complete by:  As directed      Call MD for:  severe uncontrolled pain    Complete by:  As directed      Call MD for:  temperature >100.4    Complete by:  As directed      Diet - low sodium heart healthy    Complete by:  As directed      Increase activity slowly    Complete by:  As directed           Discharge Medication List as of 12/11/2014  2:04 PM    START taking these medications   Details  ciprofloxacin (CIPRO) 500 MG tablet Take 1 tablet (500 mg total) by mouth 2 (two) times daily., Starting 12/11/2014, Until Discontinued, Print    metroNIDAZOLE (FLAGYL) 500 MG tablet Take 1 tablet (500 mg total) by mouth 3 (three) times daily., Starting 12/11/2014, Until Discontinued, Print      CONTINUE these medications which have NOT CHANGED   Details  atenolol (TENORMIN) 50 MG tablet TAKE 1 TABLET BY MOUTH EVERY DAY, Normal    clonazePAM (KLONOPIN) 0.5 MG tablet TAKE 1 TABLET BY MOUTH TWICE A DAY AS NEEDED - MUST LAST 30 DAYS, Phone In    cyclobenzaprine (FLEXERIL) 10 MG tablet TAKE 1 TABLET BY MOUTH AT BEDTIME AS NEEDED, Phone In    fluticasone (FLONASE) 50 MCG/ACT nasal spray Place 2 sprays into both nostrils daily., Starting 03/09/2014, Until Discontinued, Normal    simvastatin (ZOCOR) 40 MG tablet TAKE  1 TABLET BY MOUTH EVERY DAY, Normal    venlafaxine XR (EFFEXOR-XR) 150 MG 24 hr capsule TAKE 1 CAPSULE (150 MG TOTAL) BY MOUTH EVERY MORNING., Phone In    NASAL SALINE NA Place 1 spray into the nose daily as needed. For nose bleeds, Until Discontinued, Historical Med      STOP taking these medications     Aspirin-Salicylamide-Caffeine (BC HEADACHE POWDER PO)      HYDROcodone-acetaminophen (NORCO) 5-325 MG per tablet      ketorolac (TORADOL) 10 MG tablet        No Known Allergies Follow-up Information    Follow up with Jamesetta So, MD.   Specialty:  General Surgery   Why:  As  needed   Contact information:   [address] [place] [address] [phone]       Follow up with Odette Fraction, MD.   Specialty:  Family Medicine   Contact information:   [address] [place]  [address] [phone]        The results of significant diagnostics from this hospitalization (including imaging, microbiology, ancillary and laboratory) are listed below for reference.    Significant Diagnostic Studies: Ct Abdomen Pelvis W Contrast  12/08/2014   CLINICAL DATA:  49 year old with lower abdominal pain for 1 year. Pain has been worse for 4 days. Patient has an elevated white blood cell count.  EXAM: CT ABDOMEN AND PELVIS WITH CONTRAST  TECHNIQUE: Multidetector CT imaging of the abdomen and pelvis was performed using the standard protocol following bolus administration of intravenous contrast.  CONTRAST:  131mL OMNIPAQUE IOHEXOL 300 MG/ML  SOLN  COMPARISON:  None.  FINDINGS: The lung bases are clear.  0.6 cm hypodensity at the hepatic dome is nonspecific but probably represents a small cyst. No suspicious liver lesions. Normal appearance of the gallbladder and the portal venous system is patent. Normal appearance of the pancreas and spleen and both adrenal glands. Mild stranding around both kidneys which could be chronic. No hydronephrosis. 3.1 cm exophytic low-density cyst in the left kidney mid pole region. There is indeterminate 1.1 cm low-density lesion along the anterior left kidney. This anterior low-density structure has Hounsfield units of roughly 29 which are nonspecific.  Few calcifications in the prostate. Normal appearance of the urinary bladder. There is wall thickening and inflammatory changes around the sigmoid colon. Small foci of extraluminal gas along the posterior sigmoid colon on sequence 2, image 69. Findings suggest a small perforation without an abscess. Normal appearance of the appendix. Small lymph nodes in the mesentery on sequence 2,  image 53 could be reactive.  Evidence for healing fractures involving the left tenth and ninth ribs. Disc space narrowing with vacuum disc at L5-S1. No acute bone abnormality.  IMPRESSION: Sigmoid colon wall thickening with adjacent extraluminal gas. Findings are compatible with acute colitis with a perforation. Findings could represent acute diverticulitis. No evidence for an abscess collection.  Probable left renal cysts. However, the 1.1 cm structure along the anterior left kidney is indeterminate based on the Hounsfield units. This could be more definitively evaluated with an MRI. Alternatively, it may be large enough to be evaluated with ultrasound.  These results were called by telephone at the time of interpretation on 12/08/2014 at 3:46 pm to Dr. Roderic Palau , who verbally acknowledged these results.   Electronically Signed   By: Markus Daft M.D.   On: 12/08/2014 15:48    Microbiology: No results found for this or any previous visit (from the  past 240 hour(s)).   Labs: Basic Metabolic Panel:  Recent Labs Lab 12/07/14 1202 12/08/14 1228 12/09/14 0642 12/10/14 0624  NA 138 138 136 137  K 3.9 3.9 3.4* 3.5  CL 102 103 104 103  CO2 25 26 26 27   GLUCOSE 102* 137* 140* 120*  BUN 7 11 8 7   CREATININE 0.88 0.95 0.85 0.86  CALCIUM 9.3 9.3 8.4 8.7  MG  --   --  1.6  --    Liver Function Tests:  Recent Labs Lab 12/07/14 1202 12/09/14 0642  AST 17 16  ALT 18 16  ALKPHOS 104 77  BILITOT 0.6 0.3  PROT 6.6 5.6*  ALBUMIN 3.8 3.1*    Recent Labs Lab 12/07/14 1202  LIPASE <10   No results for input(s): AMMONIA in the last 168 hours. CBC:  Recent Labs Lab 12/07/14 1202 12/08/14 1228 12/09/14 0642 12/10/14 0624  WBC 21.8* 14.2* 13.2* 11.6*  NEUTROABS 16.4* 10.3*  --   --   HGB 17.0 15.5 13.9 14.6  HCT 49.4 48.2 42.7 45.1  MCV 92.9 97.0 97.3 97.0  PLT 288 246 251 264   Cardiac Enzymes: No results for input(s): CKTOTAL, CKMB, CKMBINDEX, TROPONINI in the last 168  hours. BNP: BNP (last 3 results) No results for input(s): PROBNP in the last 8760 hours. CBG:  Recent Labs Lab 12/08/14 1109  GLUCAP 337*       Signed:  Chieko Neises  [facility] 12/11/2014, 7:23 PM

## 2014-12-11 NOTE — Progress Notes (Signed)
  Subjective: Patient much improved. Denies any significant lower abdominal pain. No fever or chills. Has had multiple bowel movements. Tolerating full liquid diet well.  Objective: Vital signs in last 24 hours: Temp:  [98.2 F (36.8 C)-98.4 F (36.9 C)] 98.4 F (36.9 C) (01/18 0720) Pulse Rate:  [67-74] 74 (01/18 0720) Resp:  [18] 18 (01/18 0720) BP: (129-153)/(78-90) 153/86 mmHg (01/18 0720) SpO2:  [96 %-99 %] 99 % (01/18 0720) Last BM Date: 12/10/14  Intake/Output from previous day: 01/17 0701 - 01/18 0700 In: 240 [P.O.:240] Out: -  Intake/Output this shift: Total I/O In: 600 [I.V.:600] Out: -   General appearance: alert, cooperative and no distress GI: Soft, minimal left lower quadrant abdominal pain. No rigidity noted.  Lab Results:   Recent Labs  12/09/14 0642 12/10/14 0624  WBC 13.2* 11.6*  HGB 13.9 14.6  HCT 42.7 45.1  PLT 251 264   BMET  Recent Labs  12/09/14 0642 12/10/14 0624  NA 136 137  K 3.4* 3.5  CL 104 103  CO2 26 27  GLUCOSE 140* 120*  BUN 8 7  CREATININE 0.85 0.86  CALCIUM 8.4 8.7   PT/INR No results for input(s): LABPROT, INR in the last 72 hours.  Studies/Results: No results found.  Anti-infectives: Anti-infectives    Start     Dose/Rate Route Frequency Ordered Stop   12/08/14 1830  ciprofloxacin (CIPRO) IVPB 400 mg     400 mg200 mL/hr over 60 Minutes Intravenous Every 12 hours 12/08/14 1817     12/08/14 1830  metroNIDAZOLE (FLAGYL) IVPB 500 mg     500 mg100 mL/hr over 60 Minutes Intravenous Every 8 hours 12/08/14 1818     12/08/14 1600  ciprofloxacin (CIPRO) IVPB 400 mg     400 mg200 mL/hr over 60 Minutes Intravenous  Once 12/08/14 1551     12/08/14 1600  metroNIDAZOLE (FLAGYL) IVPB 500 mg     500 mg100 mL/hr over 60 Minutes Intravenous  Once 12/08/14 1551 12/08/14 1718      Assessment/Plan: Impression: Sigmoid diverticulitis, resolving, no need for acute surgical intervention. Plan: May discharge from surgical  standpoint. Would treat for 10 days with ciprofloxacin and Flagyl. Patient may follow-up either with me his primary care physician.  LOS: 3 days    Gilberto Streck A 12/11/2014

## 2014-12-11 NOTE — Progress Notes (Signed)
UR chart review completed.  

## 2014-12-11 NOTE — Care Management Note (Signed)
    Page 1 of 1   12/11/2014     1:52:15 PM CARE MANAGEMENT NOTE 12/11/2014  Patient:  CRAWFORD, TAMURA   Account Number:  [id number]  Date Initiated:  12/11/2014  Documentation initiated by:  Theophilus Kinds  Subjective/Objective Assessment:   Pt admitted from home with colitis with microperforation. Pt lives with his wife and will return home at discharge. Pt is independent with ADL's.     Action/Plan:   Pt discharged home today. No Cm needs noted.   Anticipated DC Date:  12/11/2014   Anticipated DC Plan:  [place]  CM consult      Choice offered to / List presented to:             Status of service:  Completed, signed off Medicare Important Message given?   (If response is "NO", the following Medicare IM given date fields will be blank) Date Medicare IM given:   Medicare IM given by:   Date Additional Medicare IM given:   Additional Medicare IM given by:    Discharge Disposition:  HOME/SELF CARE  Per UR Regulation:    If discussed at Long Length of Stay Meetings, dates discussed:    Comments:  12/11/14 [place], RN BSN CM

## 2014-12-21 ENCOUNTER — Encounter: Payer: Self-pay | Admitting: Gastroenterology

## 2014-12-21 ENCOUNTER — Encounter: Payer: Self-pay | Admitting: Family Medicine

## 2014-12-21 ENCOUNTER — Ambulatory Visit (INDEPENDENT_AMBULATORY_CARE_PROVIDER_SITE_OTHER): Payer: BLUE CROSS/BLUE SHIELD | Admitting: Family Medicine

## 2014-12-21 ENCOUNTER — Telehealth: Payer: Self-pay | Admitting: Gastroenterology

## 2014-12-21 VITALS — BP 132/74 | HR 80 | Temp 97.8°F | Resp 14 | Ht 68.0 in | Wt 196.0 lb

## 2014-12-21 DIAGNOSIS — K5732 Diverticulitis of large intestine without perforation or abscess without bleeding: Secondary | ICD-10-CM

## 2014-12-21 NOTE — Progress Notes (Signed)
Subjective:    Patient ID: [name], male    DOB: 1966-07-31, 49 y.o.   MRN: [id number]  HPI  Please see last office visit. At that time the patient came in with vague abdominal pain for one month. Lab work obtained that day revealed a white blood cell count greater than 20. By the next day the patient was in severe pain and was referred to the emergency room where CT scan of the abdomen revealed diverticulitis with microperforation. He was treated conservatively at the hospital with IV antibiotics for 4-5 days and was discharged to complete a total 10 day course of Cipro and Flagyl area and general surgery was consulted and monitor the patient closely but it was deemed that surgical intervention was not necessary as the patient began to dramatically improve on antibiotics. He is now been off antibiotics for over 3 days. He has mild tenderness in his lower abdomen which is much better than it was initially. He attributes this just to the healing process. He denies any fevers or chills or nausea or vomiting or melena or hematochezia. He did have a random blood sugar greater than 300 and the emergency room but subsequent hemoglobin A1c was 5.8. Patient is ready to return to work and would like to return to work on Monday. Past Medical History  Diagnosis Date  . Allergy   . Hypertension   . GAD (generalized anxiety disorder)    Past Surgical History  Procedure Laterality Date  . Rotator cuff repair    . Hernia repair    . Hemorrhoid surgery     Current Outpatient Prescriptions on File Prior to Visit  Medication Sig Dispense Refill  . atenolol (TENORMIN) 50 MG tablet TAKE 1 TABLET BY MOUTH EVERY DAY 90 tablet 2  . clonazePAM (KLONOPIN) 0.5 MG tablet TAKE 1 TABLET BY MOUTH TWICE A DAY AS NEEDED - MUST LAST 30 DAYS 30 tablet 2  . cyclobenzaprine (FLEXERIL) 10 MG tablet TAKE 1 TABLET BY MOUTH AT BEDTIME AS NEEDED 30 tablet 1  . fluticasone (FLONASE) 50 MCG/ACT nasal spray Place 2 sprays  into both nostrils daily. 16 g 6  . NASAL SALINE NA Place 1 spray into the nose daily as needed. For nose bleeds    . simvastatin (ZOCOR) 40 MG tablet TAKE 1 TABLET BY MOUTH EVERY DAY 90 tablet 2  . venlafaxine XR (EFFEXOR-XR) 150 MG 24 hr capsule TAKE 1 CAPSULE (150 MG TOTAL) BY MOUTH EVERY MORNING. 90 capsule 1   No current facility-administered medications on file prior to visit.   No Known Allergies History   Social History  . Marital Status: Divorced    Spouse Name: N/A    Number of Children: N/A  . Years of Education: N/A   Occupational History  . Not on file.   Social History Main Topics  . Smoking status: Current Every Day Smoker -- 1.00 packs/day    Types: Cigarettes  . Smokeless tobacco: Never Used  . Alcohol Use: 0.0 oz/week    0 Not specified per week  . Drug Use: No  . Sexual Activity: Yes   Other Topics Concern  . Not on file   Social History Narrative     Review of Systems  All other systems reviewed and are negative.      Objective:   Physical Exam  Constitutional: He appears well-developed and well-nourished.  Neck: Neck supple.  Cardiovascular: Normal rate, regular rhythm and normal heart sounds.   No murmur  heard. Pulmonary/Chest: Effort normal and breath sounds normal. No respiratory distress. He has no wheezes. He has no rales.  Abdominal: Soft. Bowel sounds are normal. He exhibits no distension and no mass. There is no tenderness. There is no rebound and no guarding.  Musculoskeletal: He exhibits no edema.  Skin: Skin is warm. No rash noted. No erythema.  Vitals reviewed.         Assessment & Plan:  Diverticulitis of colon - Plan: Ambulatory referral to Gastroenterology  Patient is clinically improved. I would monitor the patient closely for 1 week off antibiotics. If the patient develops worsening pain or fever he will need a CT scan immediately. Otherwise I will have the patient follow-up with gastroenterology for colonoscopy. I  explained to the patient we need to at least wait a month prior to the colonoscopy to allow his body to heal. I will defer optimal timing to the gastroenterologist. Patient is cleared to return to work on Monday as long as he continues to improve.

## 2014-12-26 ENCOUNTER — Encounter (HOSPITAL_COMMUNITY): Payer: Self-pay | Admitting: Emergency Medicine

## 2014-12-26 ENCOUNTER — Other Ambulatory Visit: Payer: Self-pay | Admitting: Family Medicine

## 2014-12-26 ENCOUNTER — Encounter: Payer: Self-pay | Admitting: Family Medicine

## 2014-12-26 ENCOUNTER — Encounter (HOSPITAL_COMMUNITY): Payer: Self-pay

## 2014-12-26 ENCOUNTER — Ambulatory Visit (HOSPITAL_COMMUNITY)
Admission: RE | Admit: 2014-12-26 | Discharge: 2014-12-26 | Disposition: A | Discharge status: Home / Self Care | Payer: BLUE CROSS/BLUE SHIELD | Source: Ambulatory Visit | Attending: Family Medicine | Admitting: Family Medicine

## 2014-12-26 ENCOUNTER — Inpatient Hospital Stay (HOSPITAL_COMMUNITY)
Admission: EM | Admit: 2014-12-26 | Discharge: 2014-12-28 | DRG: 392 | Disposition: A | Discharge status: Home / Self Care | Payer: BLUE CROSS/BLUE SHIELD | Attending: Family Medicine | Admitting: Family Medicine

## 2014-12-26 ENCOUNTER — Ambulatory Visit (INDEPENDENT_AMBULATORY_CARE_PROVIDER_SITE_OTHER): Payer: BLUE CROSS/BLUE SHIELD | Admitting: Family Medicine

## 2014-12-26 VITALS — BP 130/82 | HR 78 | Temp 98.7°F | Resp 18 | Ht 68.0 in | Wt 197.0 lb

## 2014-12-26 DIAGNOSIS — K5720 Diverticulitis of large intestine with perforation and abscess without bleeding: Secondary | ICD-10-CM

## 2014-12-26 DIAGNOSIS — Z9119 Patient's noncompliance with other medical treatment and regimen: Secondary | ICD-10-CM | POA: Diagnosis present

## 2014-12-26 DIAGNOSIS — Z6828 Body mass index (BMI) 28.0-28.9, adult: Secondary | ICD-10-CM | POA: Diagnosis not present

## 2014-12-26 DIAGNOSIS — R1032 Left lower quadrant pain: Secondary | ICD-10-CM

## 2014-12-26 DIAGNOSIS — F17210 Nicotine dependence, cigarettes, uncomplicated: Secondary | ICD-10-CM | POA: Diagnosis present

## 2014-12-26 DIAGNOSIS — R739 Hyperglycemia, unspecified: Secondary | ICD-10-CM | POA: Diagnosis present

## 2014-12-26 DIAGNOSIS — E871 Hypo-osmolality and hyponatremia: Secondary | ICD-10-CM | POA: Diagnosis present

## 2014-12-26 DIAGNOSIS — F1010 Alcohol abuse, uncomplicated: Secondary | ICD-10-CM

## 2014-12-26 DIAGNOSIS — E669 Obesity, unspecified: Secondary | ICD-10-CM | POA: Diagnosis present

## 2014-12-26 DIAGNOSIS — Z720 Tobacco use: Secondary | ICD-10-CM | POA: Diagnosis present

## 2014-12-26 DIAGNOSIS — Z09 Encounter for follow-up examination after completed treatment for conditions other than malignant neoplasm: Secondary | ICD-10-CM | POA: Insufficient documentation

## 2014-12-26 DIAGNOSIS — F411 Generalized anxiety disorder: Secondary | ICD-10-CM | POA: Diagnosis present

## 2014-12-26 DIAGNOSIS — I10 Essential (primary) hypertension: Secondary | ICD-10-CM | POA: Diagnosis present

## 2014-12-26 DIAGNOSIS — K529 Noninfective gastroenteritis and colitis, unspecified: Secondary | ICD-10-CM | POA: Diagnosis present

## 2014-12-26 DIAGNOSIS — R109 Unspecified abdominal pain: Secondary | ICD-10-CM | POA: Diagnosis present

## 2014-12-26 HISTORY — DX: Diverticulitis of large intestine with perforation and abscess without bleeding: K57.20

## 2014-12-26 HISTORY — DX: Diverticulitis of intestine, part unspecified, without perforation or abscess without bleeding: K57.92

## 2014-12-26 HISTORY — DX: Alcohol abuse, uncomplicated: F10.10

## 2014-12-26 LAB — CBC W/MCH & 3 PART DIFF
HEMATOCRIT: 49.6 % (ref 39.0–52.0)
Hemoglobin: 16.5 g/dL (ref 13.0–17.0)
LYMPHS ABS: 1.8 10*3/uL (ref 0.7–4.0)
Lymphocytes Relative: 10 % — ABNORMAL LOW (ref 12–46)
MCH: 33.5 pg (ref 26.0–34.0)
MCHC: 33.3 g/dL (ref 30.0–36.0)
MCV: 100.6 fL — ABNORMAL HIGH (ref 78.0–100.0)
NEUTROS ABS: 14.6 10*3/uL — AB (ref 1.7–7.7)
NEUTROS PCT: 80 % — AB (ref 43–77)
Platelets: 317 10*3/uL (ref 150–400)
RBC: 4.93 MIL/uL (ref 4.22–5.81)
RDW: 20.1 % — AB (ref 11.5–15.5)
WBC mixed population %: 10 % (ref 3–18)
WBC: 18.3 10*3/uL — ABNORMAL HIGH (ref 4.0–10.5)
WBCMIX: 1.8 10*3/uL (ref 0.1–1.8)

## 2014-12-26 LAB — CBC WITH DIFFERENTIAL/PLATELET
BASOS ABS: 0 10*3/uL (ref 0.0–0.1)
Basophils Relative: 0 % (ref 0–1)
Eosinophils Absolute: 0.2 10*3/uL (ref 0.0–0.7)
Eosinophils Relative: 1 % (ref 0–5)
HCT: 45.2 % (ref 39.0–52.0)
Hemoglobin: 15.4 g/dL (ref 13.0–17.0)
LYMPHS PCT: 12 % (ref 12–46)
Lymphs Abs: 2.3 10*3/uL (ref 0.7–4.0)
MCH: 32.4 pg (ref 26.0–34.0)
MCHC: 34.1 g/dL (ref 30.0–36.0)
MCV: 95 fL (ref 78.0–100.0)
MONO ABS: 2.3 10*3/uL — AB (ref 0.1–1.0)
Monocytes Relative: 12 % (ref 3–12)
Neutro Abs: 14.1 10*3/uL — ABNORMAL HIGH (ref 1.7–7.7)
Neutrophils Relative %: 75 % (ref 43–77)
Platelets: 308 10*3/uL (ref 150–400)
RBC: 4.76 MIL/uL (ref 4.22–5.81)
RDW: 13.3 % (ref 11.5–15.5)
WBC: 18.9 10*3/uL — ABNORMAL HIGH (ref 4.0–10.5)

## 2014-12-26 LAB — COMPREHENSIVE METABOLIC PANEL
ALT: 24 U/L (ref 0–53)
ANION GAP: 7 (ref 5–15)
AST: 25 U/L (ref 0–37)
Albumin: 3.6 g/dL (ref 3.5–5.2)
Alkaline Phosphatase: 98 U/L (ref 39–117)
BUN: 8 mg/dL (ref 6–23)
CO2: 24 mmol/L (ref 19–32)
Calcium: 9 mg/dL (ref 8.4–10.5)
Chloride: 102 mmol/L (ref 96–112)
Creatinine, Ser: 0.81 mg/dL (ref 0.50–1.35)
GFR calc Af Amer: >90 mL/min (ref >90)
GFR calc non Af Amer: >90 mL/min (ref >90)
GLUCOSE: 116 mg/dL — AB (ref 70–99)
Potassium: 3.9 mmol/L (ref 3.5–5.1)
SODIUM: 133 mmol/L — AB (ref 135–145)
Total Bilirubin: 0.6 mg/dL (ref 0.3–1.2)
Total Protein: 6.9 g/dL (ref 6.0–8.3)

## 2014-12-26 MED ORDER — FOLIC ACID 1 MG PO TABS
1.0000 mg | ORAL_TABLET | Freq: Every day | ORAL | Status: DC
  Administered 2014-12-26 – 2014-12-28 (×3): 1 mg via ORAL
  Filled 2014-12-26 (×3): qty 1

## 2014-12-26 MED ORDER — FOLIC ACID 1 MG PO TABS: 1.0000 mg | ORAL_TABLET | Freq: Every day | ORAL | Status: DC

## 2014-12-26 MED ORDER — ACETAMINOPHEN 650 MG RE SUPP: 650.0000 mg | Freq: Four times a day (QID) | RECTAL | Status: DC | PRN

## 2014-12-26 MED ORDER — VITAMIN B-1 100 MG PO TABS
100.0000 mg | ORAL_TABLET | Freq: Every day | ORAL | Status: DC
Start: 2014-12-26 — End: 2014-12-28
  Administered 2014-12-26 – 2014-12-28 (×3): 100 mg via ORAL
  Filled 2014-12-26 (×3): qty 1

## 2014-12-26 MED ORDER — METRONIDAZOLE IN NACL 5-0.79 MG/ML-% IV SOLN
500.0000 mg | Freq: Three times a day (TID) | INTRAVENOUS | Status: DC
  Administered 2014-12-26 – 2014-12-28 (×5): 500 mg via INTRAVENOUS
  Filled 2014-12-26 (×4): qty 100

## 2014-12-26 MED ORDER — ADULT MULTIVITAMIN W/MINERALS CH
1.0000 | ORAL_TABLET | Freq: Every day | ORAL | Status: DC
  Administered 2014-12-26 – 2014-12-28 (×3): 1 via ORAL
  Filled 2014-12-26 (×3): qty 1

## 2014-12-26 MED ORDER — CIPROFLOXACIN IN D5W 400 MG/200ML IV SOLN
400.0000 mg | Freq: Once | INTRAVENOUS | Status: AC
  Administered 2014-12-26: 400 mg via INTRAVENOUS
  Filled 2014-12-26: qty 200

## 2014-12-26 MED ORDER — MORPHINE SULFATE 2 MG/ML IJ SOLN
1.0000 mg | INTRAMUSCULAR | Status: DC | PRN
  Filled 2014-12-26: qty 1

## 2014-12-26 MED ORDER — VITAMIN B-1 100 MG PO TABS: 100.0000 mg | ORAL_TABLET | Freq: Every day | ORAL | Status: DC

## 2014-12-26 MED ORDER — THIAMINE HCL 100 MG/ML IJ SOLN: 100.0000 mg | Freq: Every day | INTRAMUSCULAR | Status: DC

## 2014-12-26 MED ORDER — ONDANSETRON HCL 4 MG/2ML IJ SOLN
4.0000 mg | Freq: Once | INTRAMUSCULAR | Status: AC
  Administered 2014-12-26: 4 mg via INTRAVENOUS
  Filled 2014-12-26: qty 2

## 2014-12-26 MED ORDER — SODIUM CHLORIDE 0.9 % IV BOLUS (SEPSIS)
1000.0000 mL | Freq: Once | INTRAVENOUS | Status: AC
  Administered 2014-12-26: 1000 mL via INTRAVENOUS

## 2014-12-26 MED ORDER — LORAZEPAM 1 MG PO TABS: 1.0000 mg | ORAL_TABLET | Freq: Four times a day (QID) | ORAL | Status: DC | PRN

## 2014-12-26 MED ORDER — MORPHINE SULFATE 4 MG/ML IJ SOLN
4.0000 mg | Freq: Once | INTRAMUSCULAR | Status: AC
  Administered 2014-12-26: 4 mg via INTRAVENOUS
  Filled 2014-12-26: qty 1

## 2014-12-26 MED ORDER — SIMVASTATIN 20 MG PO TABS
40.0000 mg | ORAL_TABLET | Freq: Every day | ORAL | Status: DC
  Administered 2014-12-27 – 2014-12-28 (×2): 40 mg via ORAL
  Filled 2014-12-26 (×2): qty 2

## 2014-12-26 MED ORDER — CLONAZEPAM 0.5 MG PO TABS
0.5000 mg | ORAL_TABLET | Freq: Two times a day (BID) | ORAL | Status: DC | PRN
  Administered 2014-12-28: 0.5 mg via ORAL
  Filled 2014-12-26: qty 1

## 2014-12-26 MED ORDER — ONDANSETRON HCL 4 MG/2ML IJ SOLN: 4.0000 mg | Freq: Four times a day (QID) | INTRAMUSCULAR | Status: DC | PRN

## 2014-12-26 MED ORDER — IOHEXOL 300 MG/ML  SOLN
100.0000 mL | Freq: Once | INTRAMUSCULAR | Status: AC | PRN
  Administered 2014-12-26: 100 mL via INTRAVENOUS

## 2014-12-26 MED ORDER — ACETAMINOPHEN 325 MG PO TABS: 650.0000 mg | ORAL_TABLET | Freq: Four times a day (QID) | ORAL | Status: DC | PRN

## 2014-12-26 MED ORDER — FLUTICASONE PROPIONATE 50 MCG/ACT NA SUSP
2.0000 | Freq: Every day | NASAL | Status: DC
  Administered 2014-12-27 – 2014-12-28 (×2): 2 via NASAL
  Filled 2014-12-26 (×2): qty 16

## 2014-12-26 MED ORDER — HYDROCODONE-ACETAMINOPHEN 5-325 MG PO TABS
1.0000 | ORAL_TABLET | ORAL | Status: DC | PRN
  Administered 2014-12-26 – 2014-12-28 (×5): 2 via ORAL
  Filled 2014-12-26 (×5): qty 2

## 2014-12-26 MED ORDER — ONDANSETRON HCL 4 MG PO TABS: 4.0000 mg | ORAL_TABLET | Freq: Four times a day (QID) | ORAL | Status: DC | PRN

## 2014-12-26 MED ORDER — ADULT MULTIVITAMIN W/MINERALS CH: 1.0000 | ORAL_TABLET | Freq: Every day | ORAL | Status: DC

## 2014-12-26 MED ORDER — CIPROFLOXACIN IN D5W 400 MG/200ML IV SOLN
400.0000 mg | Freq: Two times a day (BID) | INTRAVENOUS | Status: DC
  Administered 2014-12-27 – 2014-12-28 (×3): 400 mg via INTRAVENOUS
  Filled 2014-12-26 (×3): qty 200

## 2014-12-26 MED ORDER — LORAZEPAM 2 MG/ML IJ SOLN
1.0000 mg | Freq: Four times a day (QID) | INTRAMUSCULAR | Status: DC | PRN
Start: 2014-12-26 — End: 2014-12-26

## 2014-12-26 MED ORDER — METRONIDAZOLE IN NACL 5-0.79 MG/ML-% IV SOLN: 500.0000 mg | Freq: Three times a day (TID) | INTRAVENOUS | Status: DC

## 2014-12-26 MED ORDER — HEPARIN SODIUM (PORCINE) 5000 UNIT/ML IJ SOLN
5000.0000 [IU] | Freq: Three times a day (TID) | INTRAMUSCULAR | Status: DC
  Administered 2014-12-26 – 2014-12-27 (×4): 5000 [IU] via SUBCUTANEOUS
  Filled 2014-12-26 (×4): qty 1

## 2014-12-26 MED ORDER — LORAZEPAM 2 MG/ML IJ SOLN: 1.0000 mg | Freq: Four times a day (QID) | INTRAMUSCULAR | Status: DC | PRN

## 2014-12-26 MED ORDER — METRONIDAZOLE IN NACL 5-0.79 MG/ML-% IV SOLN
500.0000 mg | Freq: Once | INTRAVENOUS | Status: AC
  Administered 2014-12-26: 500 mg via INTRAVENOUS
  Filled 2014-12-26: qty 100

## 2014-12-26 MED ORDER — ATENOLOL 25 MG PO TABS
50.0000 mg | ORAL_TABLET | Freq: Every day | ORAL | Status: DC
  Administered 2014-12-27 – 2014-12-28 (×2): 50 mg via ORAL
  Filled 2014-12-26 (×2): qty 2

## 2014-12-26 NOTE — ED Provider Notes (Signed)
CSN: [id number]     Arrival date & time 12/26/14  1118 History  This chart was scribed for Nat Christen, MD by Lowella Petties, ED Scribe. The patient was seen in room APA19/APA19. Patient's care was started at 12:49 PM.     Chief Complaint  Patient presents with  . Abdominal Pain    Pt sent for evaluation of abd abscess and diverticulitis   The history is provided by the patient. No language interpreter was used.   HPI Comments: [name] is a 49 y.o. male who presents to the Emergency Department complaining of constant, aching, RLQ and LLQ abdominal pain. He reports associated abdominal swelling. Patient was admitted to the hospital on January 15 for sigmoid diverticulitis and possible microperforation.  He states that he tried going back to work yesterday with difficulty. He reports diaphoresis today and a normal appetite. He denies nausea, vomiting, diarrhea, fever, chills and urinary symptoms. He states that he had an abdominal CT-scan done here today as an outpatient.   Past Medical History  Diagnosis Date  . Allergy   . Hypertension   . GAD (generalized anxiety disorder)   . Diverticulitis    Past Surgical History  Procedure Laterality Date  . Rotator cuff repair    . Hernia repair    . Hemorrhoid surgery     Family History  Problem Relation Age of Onset  . Stroke Mother   . CAD Father    History  Substance Use Topics  . Smoking status: Current Every Day Smoker -- 1.00 packs/day    Types: Cigarettes  . Smokeless tobacco: Never Used  . Alcohol Use: 2.4 oz/week    0 Not specified, 4 Cans of beer per week    Review of Systems  Constitutional: Positive for diaphoresis. Negative for appetite change.  Gastrointestinal: Positive for abdominal distention.  A complete 10 system review of systems was obtained and all systems are negative except as noted in the HPI and PMH.   Allergies  Review of patient's allergies indicates no known allergies.  Home Medications    Prior to Admission medications   Medication Sig Start Date End Date Taking? Authorizing Provider  atenolol (TENORMIN) 50 MG tablet TAKE 1 TABLET BY MOUTH EVERY DAY 10/25/14  Yes Susy Frizzle, MD  clonazePAM (KLONOPIN) 0.5 MG tablet TAKE 1 TABLET BY MOUTH TWICE A DAY AS NEEDED - MUST LAST 30 DAYS 09/28/14  Yes Susy Frizzle, MD  fluticasone [facility]) 50 MCG/ACT nasal spray Place 2 sprays into both nostrils daily. 03/09/14  Yes Susy Frizzle, MD  NASAL SALINE NA Place 1 spray into the nose daily as needed. For nose bleeds   Yes Historical Provider, MD  simvastatin (ZOCOR) 40 MG tablet TAKE 1 TABLET BY MOUTH EVERY DAY 10/25/14  Yes Susy Frizzle, MD  venlafaxine XR (EFFEXOR-XR) 150 MG 24 hr capsule TAKE 1 CAPSULE (150 MG TOTAL) BY MOUTH EVERY MORNING.   Yes Susy Frizzle, MD  cyclobenzaprine (FLEXERIL) 10 MG tablet TAKE 1 TABLET BY MOUTH AT BEDTIME AS NEEDED 10/29/13   Susy Frizzle, MD   Triage Vitals: BP 152/90 mmHg  Pulse 97  Temp(Src) 98.1 F (36.7 C) (Oral)  Resp 18  Ht 5\' 9"  (1.753 m)  Wt 190 lb (86.183 kg)  BMI 28.05 kg/m2  SpO2 99% Physical Exam  Constitutional: He is oriented to person, place, and time. He appears well-developed and well-nourished.  HENT:  Head: Normocephalic and atraumatic.  Mouth/Throat: Oropharynx is  clear and moist.  Eyes: Conjunctivae and EOM are normal. Pupils are equal, round, and reactive to light.  Neck: Normal range of motion. Neck supple.  Cardiovascular: Normal rate, regular rhythm and normal heart sounds.   No murmur heard. Pulmonary/Chest: Effort normal and breath sounds normal. No respiratory distress. He has no wheezes. He exhibits no tenderness.  Abdominal: Soft. Bowel sounds are normal. There is tenderness (RLQ and LLQ).  Musculoskeletal: Normal range of motion.  Neurological: He is alert and oriented to person, place, and time.  Skin: Skin is warm and dry.  Psychiatric: He has a normal mood and affect. His behavior is  normal.  Nursing note and vitals reviewed.   ED Course  Procedures (including critical care time) DIAGNOSTIC STUDIES: Oxygen Saturation is 99% on room air, normal by my interpretation.    COORDINATION OF CARE: 12:54 PM-Discussed treatment plan which includes lab work and review of abdominal CT-scan with pt at bedside and pt agreed to plan.   Labs Review Labs Reviewed  CBC WITH DIFFERENTIAL/PLATELET - Abnormal; Notable for the following:    WBC 18.9 (*)    Neutro Abs 14.1 (*)    Monocytes Absolute 2.3 (*)    All other components within normal limits  COMPREHENSIVE METABOLIC PANEL - Abnormal; Notable for the following:    Sodium 133 (*)    Glucose, Bld 116 (*)    All other components within normal limits  URINALYSIS, ROUTINE W REFLEX MICROSCOPIC    Imaging Review Ct Abdomen Pelvis W Contrast  12/26/2014   CLINICAL DATA:  Follow-up diverticulitis with micro perforation  EXAM: CT ABDOMEN AND PELVIS WITH CONTRAST  TECHNIQUE: Multidetector CT imaging of the abdomen and pelvis was performed using the standard protocol following bolus administration of intravenous contrast.  CONTRAST:  154mL OMNIPAQUE IOHEXOL 300 MG/ML  SOLN  COMPARISON:  12/08/2014  FINDINGS: There is mild thickening of distal esophageal wall probable from gastroesophageal reflux. Stable small cyst or hemangioma in right hepatic dome centrally. No calcified gallstones are noted within gallbladder. Sagittal images of the spine shows significant disc space flattening with vacuum disc phenomenon and mild anterior and posterior spurring at L5-S1 level. Stable degenerative changes thoracolumbar spine. No aortic aneurysm.  The pancreas, spleen and adrenal glands are unremarkable. Kidneys are symmetrical in size and enhancement. Left renal cyst is stable. No aortic aneurysm. Normal appendix. No small bowel obstruction. No ascites or free abdominal air.  Again noted focal thickening of sigmoid colon wall in left lower quadrant with  worsening from prior exam. There is worsening of pericolonic inflammatory changes. Again noted small contained perforation. There is a pericolonic abscess measures 2.6 cm with small air-fluid level. This is best visualized in sagittal image 68. No distal colonic obstruction. No inguinal adenopathy. The urinary bladder is unremarkable. Small right inguinal scrotal canal hernia containing fat without evidence of acute complication measures about 2.7 cm. Delayed renal images shows bilateral renal symmetrical excretion. Bilateral visualized proximal ureter is unremarkable.  IMPRESSION: 1. Again noted inflammatory changes proximal sigmoid colon with focal thickening of sigmoid colon wall with worsening from prior exam. Again noted small contained perforation. There is now a pericolonic abscess with air-fluid level measures about 2.6 cm. Findings are consistent with worsening focal colitis or diverticulitis. 2. No small bowel obstruction. 3. No pericecal inflammation.  Normal appendix. 4. No hydronephrosis or hydroureter. These results were called by telephone at the time of interpretation on 12/26/2014 at 11:19 am to Dr. Jenna Luo , who verbally acknowledged these  results.   Electronically Signed   By: Lahoma Crocker M.D.   On: 12/26/2014 11:20     EKG Interpretation None      MDM   Final diagnoses:  Colonic diverticular abscess   CT scan today reveals a proximal sigmoid colitis with a small contained perforation.  Additionally, there is a pericolonic abscess measuring 2.6 cm in diameter. Discussed with Dr. Aviva Signs. He will consult. IV Flagyl, IV Cipro, admit to general medicine. Discussed with Dr. Roderic Palau  I personally performed the services described in this documentation, which was scribed in my presence. The recorded information has been reviewed and is accurate.    Nat Christen, MD 12/26/14 1414

## 2014-12-26 NOTE — H&P (Signed)
[facility] History and Physical  [name]:[id number] DOB: 1965-12-22 DOA: 12/26/2014  Referring physician:  PCP: Odette Fraction, MD   Chief Complaint: Abdominal pain HPI: [name] is a 49 y.o. male patient with a history of diverticulitis with microperforation since emergency department with the chief complaint of constant worsening abdominal pain. Initial evaluation reveals worsening proximal sigmoid colitis with a small contained perforation as well as pericolonic abscess.  Patient reports he was recently discharged the hospital for same and return to work yesterday. After working yesterday he began to feel somewhat bloated developing abdominal pain and diaphoresis. He denies nausea vomiting chest pain palpitation shortness of breath. He denies any fever chills or recent sick contacts. Does report having a BM yesterday and it was normal in color and consistency. He states the pain is located mostly in his lower abdomen he rates it a 5 out of 10. Nothing makes it better or worse.  Workup in the emergency department as a leukocytosis of 18.9 mild hyponatremia 133 mild hyperglycemia with a serum glucose of 116. Abdominal CT noted inflammatory changes proximal sigmoid colon worsening from prior exam with small contained perforation as well as pericolonic abscess with air-fluid. No small bowel obstruction no hydronephrosis.  He is afebrile hemodynamically stable and not hypoxic. Provide it was Cipro Flagyl morphine Zofran as well as a liter normal saline.    Review of Systems:  And point review of systems complete and all systems are negative except as indicated in the history of present illness  Past Medical History  Diagnosis Date  . Allergy   . Hypertension   . GAD (generalized anxiety disorder)   . Diverticulitis   . ETOH abuse    Past Surgical History  Procedure Laterality Date  . Rotator cuff repair    . Hernia repair    . Hemorrhoid surgery      Social History:  reports that he has been smoking Cigarettes.  He has been smoking about 1.00 pack per day. He has never used smokeless tobacco. He reports that he drinks about 2.4 oz of alcohol per week. He reports that he does not use illicit drugs. He's married lives at home with his wife he smokes he drinks 5-6 beers a day Monday through Friday and on weekends more than that. No Known Allergies  Family History  Problem Relation Age of Onset  . Stroke Mother   . CAD Father      Prior to Admission medications   Medication Sig Start Date End Date Taking? Authorizing Provider  atenolol (TENORMIN) 50 MG tablet TAKE 1 TABLET BY MOUTH EVERY DAY 10/25/14  Yes Susy Frizzle, MD  clonazePAM (KLONOPIN) 0.5 MG tablet TAKE 1 TABLET BY MOUTH TWICE A DAY AS NEEDED - MUST LAST 30 DAYS 09/28/14  Yes Susy Frizzle, MD  fluticasone [facility]) 50 MCG/ACT nasal spray Place 2 sprays into both nostrils daily. 03/09/14  Yes Susy Frizzle, MD  NASAL SALINE NA Place 1 spray into the nose daily as needed. For nose bleeds   Yes Historical Provider, MD  simvastatin (ZOCOR) 40 MG tablet TAKE 1 TABLET BY MOUTH EVERY DAY 10/25/14  Yes Susy Frizzle, MD  venlafaxine XR (EFFEXOR-XR) 150 MG 24 hr capsule TAKE 1 CAPSULE (150 MG TOTAL) BY MOUTH EVERY MORNING.   Yes Susy Frizzle, MD  cyclobenzaprine (FLEXERIL) 10 MG tablet TAKE 1 TABLET BY MOUTH AT BEDTIME AS NEEDED 10/29/13   Susy Frizzle, MD   Physical  Exam: Filed Vitals:   12/26/14 1145  BP: 152/90  Pulse: 97  Temp: 98.1 F (36.7 C)  TempSrc: Oral  Resp: 18  Height: 5\' 9"  (1.753 m)  Weight: 86.183 kg (190 lb)  SpO2: 99%    Wt Readings from Last 3 Encounters:  12/26/14 86.183 kg (190 lb)  12/26/14 89.359 kg (197 lb)  12/21/14 88.905 kg (196 lb)    General:  Appears calm and comfortable Eyes: PERRL, normal lids, irises & conjunctiva ENT: grossly normal hearing, his membranes of his mouth are pink slightly dry Neck: no LAD, masses or  thyromegaly Cardiovascular: RRR, no m/r/g. No LE edema. Telemetry: SR, no arrhythmias  Respiratory: CTA bilaterally, no w/r/r. Normal respiratory effort. Abdomen: Lightly firm, slightly distended all diffuse tenderness to palpation positive bowel sounds throughout Skin: no rash or induration seen on limited exam Musculoskeletal: grossly normal tone BUE/BLE Psychiatric: grossly normal mood and affect, speech fluent and appropriate Neurologic: grossly non-focal.          Labs on Admission:  Basic Metabolic Panel:  Recent Labs Lab 12/26/14 1228  NA 133*  K 3.9  CL 102  CO2 24  GLUCOSE 116*  BUN 8  CREATININE 0.81  CALCIUM 9.0   Liver Function Tests:  Recent Labs Lab 12/26/14 1228  AST 25  ALT 24  ALKPHOS 98  BILITOT 0.6  PROT 6.9  ALBUMIN 3.6   No results for input(s): LIPASE, AMYLASE in the last 168 hours. No results for input(s): AMMONIA in the last 168 hours. CBC:  Recent Labs Lab 12/26/14 0823 12/26/14 1228  WBC 18.3* 18.9*  NEUTROABS 14.6* 14.1*  HGB 16.5 15.4  HCT 49.6 45.2  MCV 100.6* 95.0  PLT 317 308   Cardiac Enzymes: No results for input(s): CKTOTAL, CKMB, CKMBINDEX, TROPONINI in the last 168 hours.  BNP (last 3 results) No results for input(s): BNP in the last 8760 hours.  ProBNP (last 3 results) No results for input(s): PROBNP in the last 8760 hours.  CBG: No results for input(s): GLUCAP in the last 168 hours.  Radiological Exams on Admission: Ct Abdomen Pelvis W Contrast  12/26/2014   CLINICAL DATA:  Follow-up diverticulitis with micro perforation  EXAM: CT ABDOMEN AND PELVIS WITH CONTRAST  TECHNIQUE: Multidetector CT imaging of the abdomen and pelvis was performed using the standard protocol following bolus administration of intravenous contrast.  CONTRAST:  12mL OMNIPAQUE IOHEXOL 300 MG/ML  SOLN  COMPARISON:  12/08/2014  FINDINGS: There is mild thickening of distal esophageal wall probable from gastroesophageal reflux. Stable small cyst  or hemangioma in right hepatic dome centrally. No calcified gallstones are noted within gallbladder. Sagittal images of the spine shows significant disc space flattening with vacuum disc phenomenon and mild anterior and posterior spurring at L5-S1 level. Stable degenerative changes thoracolumbar spine. No aortic aneurysm.  The pancreas, spleen and adrenal glands are unremarkable. Kidneys are symmetrical in size and enhancement. Left renal cyst is stable. No aortic aneurysm. Normal appendix. No small bowel obstruction. No ascites or free abdominal air.  Again noted focal thickening of sigmoid colon wall in left lower quadrant with worsening from prior exam. There is worsening of pericolonic inflammatory changes. Again noted small contained perforation. There is a pericolonic abscess measures 2.6 cm with small air-fluid level. This is best visualized in sagittal image 68. No distal colonic obstruction. No inguinal adenopathy. The urinary bladder is unremarkable. Small right inguinal scrotal canal hernia containing fat without evidence of acute complication measures about 2.7 cm. Delayed renal  images shows bilateral renal symmetrical excretion. Bilateral visualized proximal ureter is unremarkable.  IMPRESSION: 1. Again noted inflammatory changes proximal sigmoid colon with focal thickening of sigmoid colon wall with worsening from prior exam. Again noted small contained perforation. There is now a pericolonic abscess with air-fluid level measures about 2.6 cm. Findings are consistent with worsening focal colitis or diverticulitis. 2. No small bowel obstruction. 3. No pericecal inflammation.  Normal appendix. 4. No hydronephrosis or hydroureter. These results were called by telephone at the time of interpretation on 12/26/2014 at 11:19 am to Dr. Jenna Luo , who verbally acknowledged these results.   Electronically Signed   By: Lahoma Crocker M.D.   On: 12/26/2014 11:20    EKG:   Assessment/Plan Principal Problem:   Colitis with Colonic diverticular abscess: Recurrence. CT with worsening. Will admit to medical floor will provide supportive therapy in the form of pain medicine and antiemetic. Will continue Cipro and Flagyl that was initiated in the emergency department. Will provide a clear liquid diet as tolerated. Await general surgery input. Active Problems:   Anxiety state: Are stable at baseline we'll continue home medications   Essential hypertension: Controlled medications include atenolol we will continue this    Tobacco abuse: Cessation counseling offered    Obesity: BMI 28.1. Nutritional counseling  [place] abuse. Patient reports drinking 5-6 beers daily Monday through Friday and on the weekends "I really crank it up". I will initiate the CIWA protocol and monitor closely. Early no signs or symptoms of DTs  General surgery   Code Status: full DVT Prophylaxis: Family Communication: Wife at bedside Disposition Plan: home when ready  Time spent: 73 minutes  [facility]

## 2014-12-26 NOTE — Progress Notes (Signed)
ANTIBIOTIC CONSULT NOTE - INITIAL  Pharmacy Consult for Cipro Indication: Intra abdominal infection  No Known Allergies  Patient Measurements: Height: 5\' 9"  (175.3 cm) Weight: 190 lb (86.183 kg) IBW/kg (Calculated) : 70.7 Adjusted Body Weight:   Vital Signs: Temp: 98.1 F (36.7 C) (02/02 1145) Temp Source: Oral (02/02 1145) BP: 152/90 mmHg (02/02 1145) Pulse Rate: 97 (02/02 1145) Intake/Output from previous day:   Intake/Output from this shift:    Labs:  Recent Labs  12/26/14 0823 12/26/14 1228  WBC 18.3* 18.9*  HGB 16.5 15.4  PLT 317 308  CREATININE  --  0.81   Estimated Creatinine Clearance: 118.7 mL/min (by C-G formula based on Cr of 0.81). No results for input(s): VANCOTROUGH, VANCOPEAK, VANCORANDOM, GENTTROUGH, GENTPEAK, GENTRANDOM, TOBRATROUGH, TOBRAPEAK, TOBRARND, AMIKACINPEAK, AMIKACINTROU, AMIKACIN in the last 72 hours.   Microbiology: No results found for this or any previous visit (from the past 720 hour(s)).  Medical History: Past Medical History  Diagnosis Date  . Allergy   . Hypertension   . GAD (generalized anxiety disorder)   . Diverticulitis   . ETOH abuse     Medications:  Scheduled:  . atenolol  50 mg Oral Daily  . [START ON 12/27/2014] ciprofloxacin  400 mg Intravenous Q12H  . fluticasone  2 spray Each Nare Daily  . folic acid  1 mg Oral Daily  . heparin  5,000 Units Subcutaneous 3 times per day  . metronidazole  500 mg Intravenous Q8H  . multivitamin with minerals  1 tablet Oral Daily  . simvastatin  40 mg Oral Daily  . thiamine  100 mg Oral Daily   Or  . thiamine  100 mg Intravenous Daily   Assessment: 49 y.o. male patient with a history of diverticulitis with microperforation. Patient in ED with the chief complaint of constant worsening abdominal pain. Initial evaluation reveals worsening proximal sigmoid colitis with a small contained perforation as well as pericolonic abscess. Flagyl 500 mg IV and Cipro 400 mg IV given in  ED Flagyl 500 mg IV every 8 hours continued on admission Excellent renal function  Goal of Therapy:  Eradicate infection  Plan:  Cipro 400 mg IV every 12 hours, next dose 12/27/2014 at 2 AM. Monitor renal function Labs per protocol  Abner Greenspan, Kimba Lottes Bennett 12/26/2014,5:05 PM

## 2014-12-26 NOTE — ED Notes (Signed)
Nurse busy upstairs, to call back for report when available. Pt. Updated.

## 2014-12-26 NOTE — ED Notes (Signed)
Pt sent by MD for evaluation of abdominal abscess and diverticulitis found on CT today.

## 2014-12-26 NOTE — Progress Notes (Signed)
Subjective:    Patient ID: [name], male    DOB: August 05, 1966, 49 y.o.   MRN: [id number]  HPI  12/21/13 Please see last office visit. At that time the patient came in with vague abdominal pain for one month. Lab work obtained that day revealed a white blood cell count greater than 20. By the next day the patient was in severe pain and was referred to the emergency room where CT scan of the abdomen revealed diverticulitis with microperforation. He was treated conservatively at the hospital with IV antibiotics for 4-5 days and was discharged to complete a total 10 day course of Cipro and Flagyl area and general surgery was consulted and monitor the patient closely but it was deemed that surgical intervention was not necessary as the patient began to dramatically improve on antibiotics. He is now been off antibiotics for over 3 days. He has mild tenderness in his lower abdomen which is much better than it was initially. He attributes this just to the healing process. He denies any fevers or chills or nausea or vomiting or melena or hematochezia. He did have a random blood sugar greater than 300 and the emergency room but subsequent hemoglobin A1c was 5.8. Patient is ready to return to work and would like to return to work on Monday.  At that time, my plan was:  Patient is clinically improved. I would monitor the patient closely for 1 week off antibiotics. If the patient develops worsening pain or fever he will need a CT scan immediately. Otherwise I will have the patient follow-up with gastroenterology for colonoscopy. I explained to the patient we need to at least wait a month prior to the colonoscopy to allow his body to heal. I will defer optimal timing to the gastroenterologist. Patient is cleared to return to work on Monday as long as he continues to improve.  12/26/14 Patient awoke this am with LLQ abd pain, nausea, and lightheadedness.  WBC is 18.  He is tender to palpation in LLQ. Past  Medical History  Diagnosis Date  . Allergy   . Hypertension   . GAD (generalized anxiety disorder)    Past Surgical History  Procedure Laterality Date  . Rotator cuff repair    . Hernia repair    . Hemorrhoid surgery     Current Outpatient Prescriptions on File Prior to Visit  Medication Sig Dispense Refill  . atenolol (TENORMIN) 50 MG tablet TAKE 1 TABLET BY MOUTH EVERY DAY 90 tablet 2  . clonazePAM (KLONOPIN) 0.5 MG tablet TAKE 1 TABLET BY MOUTH TWICE A DAY AS NEEDED - MUST LAST 30 DAYS 30 tablet 2  . cyclobenzaprine (FLEXERIL) 10 MG tablet TAKE 1 TABLET BY MOUTH AT BEDTIME AS NEEDED 30 tablet 1  . fluticasone (FLONASE) 50 MCG/ACT nasal spray Place 2 sprays into both nostrils daily. 16 g 6  . NASAL SALINE NA Place 1 spray into the nose daily as needed. For nose bleeds    . simvastatin (ZOCOR) 40 MG tablet TAKE 1 TABLET BY MOUTH EVERY DAY 90 tablet 2  . venlafaxine XR (EFFEXOR-XR) 150 MG 24 hr capsule TAKE 1 CAPSULE (150 MG TOTAL) BY MOUTH EVERY MORNING. 90 capsule 1   No current facility-administered medications on file prior to visit.   No Known Allergies History   Social History  . Marital Status: Divorced    Spouse Name: N/A    Number of Children: N/A  . Years of Education: N/A   Occupational History  .  Not on file.   Social History Main Topics  . Smoking status: Current Every Day Smoker -- 1.00 packs/day    Types: Cigarettes  . Smokeless tobacco: Never Used  . Alcohol Use: 0.0 oz/week    0 Not specified per week  . Drug Use: No  . Sexual Activity: Yes   Other Topics Concern  . Not on file   Social History Narrative     Review of Systems  All other systems reviewed and are negative.      Objective:   Physical Exam  Constitutional: He appears well-developed and well-nourished.  Neck: Neck supple.  Cardiovascular: Normal rate, regular rhythm and normal heart sounds.   No murmur heard. Pulmonary/Chest: Effort normal and breath sounds normal. No  respiratory distress. He has no wheezes. He has no rales.  Abdominal: Soft. Bowel sounds are normal. He exhibits no distension and no mass. There is tenderness. There is guarding. There is no rebound.  Musculoskeletal: He exhibits no edema.  Skin: Skin is warm. No rash noted. No erythema.  Vitals reviewed.         Assessment & Plan:  Continuous LLQ abdominal pain - Plan: CBC w/M[facility] & 3 Part Diff  I will send the patient for a stat CT scan of the abdomen and pelvis with contrast. CT confirms microperforation and diverticulitis he will need to go directly to the emergency room for admission IV antibiotics and surgical consultation.

## 2014-12-27 LAB — BASIC METABOLIC PANEL
Anion gap: 9 (ref 5–15)
BUN: 6 mg/dL (ref 6–23)
CHLORIDE: 101 mmol/L (ref 96–112)
CO2: 28 mmol/L (ref 19–32)
Calcium: 8.8 mg/dL (ref 8.4–10.5)
Creatinine, Ser: 0.85 mg/dL (ref 0.50–1.35)
GFR calc Af Amer: >90 mL/min (ref >90)
GFR calc non Af Amer: >90 mL/min (ref >90)
Glucose, Bld: 113 mg/dL — ABNORMAL HIGH (ref 70–99)
POTASSIUM: 3.7 mmol/L (ref 3.5–5.1)
Sodium: 138 mmol/L (ref 135–145)

## 2014-12-27 LAB — CBC
HCT: 45.9 % (ref 39.0–52.0)
Hemoglobin: 14.9 g/dL (ref 13.0–17.0)
MCH: 31.4 pg (ref 26.0–34.0)
MCHC: 32.5 g/dL (ref 30.0–36.0)
MCV: 96.6 fL (ref 78.0–100.0)
Platelets: 276 10*3/uL (ref 150–400)
RBC: 4.75 MIL/uL (ref 4.22–5.81)
RDW: 13.4 % (ref 11.5–15.5)
WBC: 14.9 10*3/uL — AB (ref 4.0–10.5)

## 2014-12-27 LAB — PROTIME-INR
INR: 1.19 (ref 0.00–1.49)
Prothrombin Time: 15.2 seconds (ref 11.6–15.2)

## 2014-12-27 LAB — URINALYSIS, ROUTINE W REFLEX MICROSCOPIC
BILIRUBIN URINE: NEGATIVE
GLUCOSE, UA: NEGATIVE mg/dL
Hgb urine dipstick: NEGATIVE
Ketones, ur: NEGATIVE mg/dL
Leukocytes, UA: NEGATIVE
NITRITE: NEGATIVE
PH: 6 (ref 5.0–8.0)
Protein, ur: NEGATIVE mg/dL
Specific Gravity, Urine: 1.01 (ref 1.005–1.030)
UROBILINOGEN UA: 0.2 mg/dL (ref 0.0–1.0)

## 2014-12-27 LAB — APTT: aPTT: 36 seconds (ref 24–37)

## 2014-12-27 MED ORDER — SODIUM CHLORIDE 0.9 % IV SOLN
INTRAVENOUS | Status: DC
  Administered 2014-12-28: 05:00:00 via INTRAVENOUS

## 2014-12-27 NOTE — Progress Notes (Signed)
[facility] PROGRESS NOTE  [name] [id number]:[id number] DOB: 1966/06/02 DOA: 12/26/2014 PCP: Odette Fraction, MD  Assessment/Plan: Colitis with microperf and Colonic diverticular abscess: Recurrence. Evaluated by IR who opine no need for drain at this time and recommend continuation of antibiotics and surveillance. Will continue Cipro and Flagyl day #2. appreciate general surgery input who opine continue bowel rest as well. Clinically improved. White count trending downward. He remains afebrile and non-toxic appearing.   Active Problems:  Anxiety state: Stable at baseline.  Essential hypertension: Controlled.   Tobacco abuse: Cessation counseling offered   Obesity: BMI 28.1. Nutritional counseling  ETOH abuse. No signs/symptoms withdrawal. continue CIWA protocol and monitor closely.    Code Status: full Family Communication: none present Disposition Plan: home when ready   Consultants:  General surgery  Procedures:  none  Antibiotics:  cipro 12/26/14>>  Flagyl 12/26/14>>  HPI/Subjective: Reports good pain control. Passing flatus  Objective: Filed Vitals:   12/27/14 0758  BP: 109/73  Pulse: 88  Temp: 98.6 F (37 C)  Resp: 18    Intake/Output Summary (Last 24 hours) at 12/27/14 1045 Last data filed at 12/26/14 2100  Gross per 24 hour  Intake    240 ml  Output    150 ml  Net     90 ml   Filed Weights   12/26/14 1145 12/26/14 1715  Weight: 86.183 kg (190 lb) 85.911 kg (189 lb 6.4 oz)    Exam:   General:  Well nourished appears comfortable   Cardiovascular: RRR no m/g/r no LE edema  Respiratory: normal effort BS clear bilaterally no wheeze or rhonchi  Abdomen: softer today, +BS mild diffuse tenderness to palpation no rebounding no guarding  Musculoskeletal: joints without swelling or erythema   Data Reviewed: Basic Metabolic Panel:  Recent Labs Lab 12/26/14 1228 12/27/14 0653  NA 133* 138  K 3.9 3.7  CL 102 101  CO2 24 28   GLUCOSE 116* 113*  BUN 8 6  CREATININE 0.81 0.85  CALCIUM 9.0 8.8   Liver Function Tests:  Recent Labs Lab 12/26/14 1228  AST 25  ALT 24  ALKPHOS 98  BILITOT 0.6  PROT 6.9  ALBUMIN 3.6   No results for input(s): LIPASE, AMYLASE in the last 168 hours. No results for input(s): AMMONIA in the last 168 hours. CBC:  Recent Labs Lab 12/26/14 0823 12/26/14 1228 12/27/14 0653  WBC 18.3* 18.9* 14.9*  NEUTROABS 14.6* 14.1*  --   HGB 16.5 15.4 14.9  HCT 49.6 45.2 45.9  MCV 100.6* 95.0 96.6  PLT 317 308 276   Cardiac Enzymes: No results for input(s): CKTOTAL, CKMB, CKMBINDEX, TROPONINI in the last 168 hours. BNP (last 3 results) No results for input(s): BNP in the last 8760 hours.  ProBNP (last 3 results) No results for input(s): PROBNP in the last 8760 hours.  CBG: No results for input(s): GLUCAP in the last 168 hours.  No results found for this or any previous visit (from the past 240 hour(s)).   Studies: Ct Abdomen Pelvis W Contrast  12/26/2014   CLINICAL DATA:  Follow-up diverticulitis with micro perforation  EXAM: CT ABDOMEN AND PELVIS WITH CONTRAST  TECHNIQUE: Multidetector CT imaging of the abdomen and pelvis was performed using the standard protocol following bolus administration of intravenous contrast.  CONTRAST:  114mL OMNIPAQUE IOHEXOL 300 MG/ML  SOLN  COMPARISON:  12/08/2014  FINDINGS: There is mild thickening of distal esophageal wall probable from gastroesophageal reflux. Stable small cyst or hemangioma in right  hepatic dome centrally. No calcified gallstones are noted within gallbladder. Sagittal images of the spine shows significant disc space flattening with vacuum disc phenomenon and mild anterior and posterior spurring at L5-S1 level. Stable degenerative changes thoracolumbar spine. No aortic aneurysm.  The pancreas, spleen and adrenal glands are unremarkable. Kidneys are symmetrical in size and enhancement. Left renal cyst is stable. No aortic aneurysm.  Normal appendix. No small bowel obstruction. No ascites or free abdominal air.  Again noted focal thickening of sigmoid colon wall in left lower quadrant with worsening from prior exam. There is worsening of pericolonic inflammatory changes. Again noted small contained perforation. There is a pericolonic abscess measures 2.6 cm with small air-fluid level. This is best visualized in sagittal image 68. No distal colonic obstruction. No inguinal adenopathy. The urinary bladder is unremarkable. Small right inguinal scrotal canal hernia containing fat without evidence of acute complication measures about 2.7 cm. Delayed renal images shows bilateral renal symmetrical excretion. Bilateral visualized proximal ureter is unremarkable.  IMPRESSION: 1. Again noted inflammatory changes proximal sigmoid colon with focal thickening of sigmoid colon wall with worsening from prior exam. Again noted small contained perforation. There is now a pericolonic abscess with air-fluid level measures about 2.6 cm. Findings are consistent with worsening focal colitis or diverticulitis. 2. No small bowel obstruction. 3. No pericecal inflammation.  Normal appendix. 4. No hydronephrosis or hydroureter. These results were called by telephone at the time of interpretation on 12/26/2014 at 11:19 am to Dr. Jenna Luo , who verbally acknowledged these results.   Electronically Signed   By: Lahoma Crocker M.D.   On: 12/26/2014 11:20    Scheduled Meds: . atenolol  50 mg Oral Daily  . ciprofloxacin  400 mg Intravenous Q12H  . fluticasone  2 spray Each Nare Daily  . folic acid  1 mg Oral Daily  . heparin  5,000 Units Subcutaneous 3 times per day  . metronidazole  500 mg Intravenous Q8H  . multivitamin with minerals  1 tablet Oral Daily  . simvastatin  40 mg Oral Daily  . thiamine  100 mg Oral Daily   Or  . thiamine  100 mg Intravenous Daily   Continuous Infusions:   Principal Problem:   Colonic diverticular abscess Active Problems:    Anxiety state   Essential hypertension   Tobacco abuse   Obesity   Colitis   ETOH abuse    Time spent: 35 minutes    [facility] Pager [phone]. If 7PM-7AM, please contact night-coverage at [email or web address], password [email or web address] 12/27/2014, 10:45 AM  LOS: 1 day

## 2014-12-27 NOTE — Care Management Note (Addendum)
    Page 1 of 1   12/28/2014     10:34:38 AM CARE MANAGEMENT NOTE 12/28/2014  Patient:  [name]   Account Number:  [id number]  Date Initiated:  12/27/2014  Documentation initiated by:  Theophilus Kinds  Subjective/Objective Assessment:   Pt admitted from home with abd pain. Pt lives with his wife and will return home at discharge. Pt is independent with ADL's.     Action/Plan:   No CM needs noted.   Anticipated DC Date:  12/30/2014   Anticipated DC Plan:  [place]  CM consult      Choice offered to / List presented to:             Status of service:  Completed, signed off Medicare Important Message given?   (If response is "NO", the following Medicare IM given date fields will be blank) Date Medicare IM given:   Medicare IM given by:   Date Additional Medicare IM given:   Additional Medicare IM given by:    Discharge Disposition:  HOME/SELF CARE  Per UR Regulation:    If discussed at Long Length of Stay Meetings, dates discussed:    Comments:  12/28/14 [place], RN BSN CM Pt discharging home today. No CM needs noted.  12/27/14 [place], RN BSN CM

## 2014-12-27 NOTE — Progress Notes (Signed)
IR PA aware of request for CT guided abscess drain placement, images reviewed by Dr. Anselm Pancoast and recommends continued antibiotic therapy and surveillance, no drain at this time.  Tsosie Billing PA-C Interventional Radiology  12/27/14  9:13 AM

## 2014-12-27 NOTE — Consult Note (Signed)
Reason for Consult: Sigmoid colon diverticular abscess Referring Physician: Hospitalist  [name] is an 49 y.o. male.  HPI: Patient is a 49 year old white male who I previously saw several weeks ago for sigmoid colon diverticulitis. He was sent home and continued on by mouth antibiotics. He started to go to work earlier this week. He began experiencing lower abdominal pain. A repeat CT scan the abdomen was performed which revealed sigmoid diverticulitis with a 2.5 cm contained abscess. He does have a leukocytosis and was admitted to the hospital for further evaluation treatment.  Past Medical History  Diagnosis Date  . Allergy   . Hypertension   . GAD (generalized anxiety disorder)   . Diverticulitis   . ETOH abuse     Past Surgical History  Procedure Laterality Date  . Rotator cuff repair    . Hernia repair    . Hemorrhoid surgery      Family History  Problem Relation Age of Onset  . Stroke Mother   . CAD Father     Social History:  reports that he has been smoking Cigarettes.  He has been smoking about 1.00 pack per day. He has never used smokeless tobacco. He reports that he drinks about 2.4 oz of alcohol per week. He reports that he does not use illicit drugs.  Allergies: No Known Allergies  Medications: I have reviewed the patient's current medications.  Results for orders placed or performed during the hospital encounter of 12/26/14 (from the past 48 hour(s))  CBC with Differential     Status: Abnormal   Collection Time: 12/26/14 12:28 PM  Result Value Ref Range   WBC 18.9 (H) 4.0 - 10.5 K/uL   RBC 4.76 4.22 - 5.81 MIL/uL   Hemoglobin 15.4 13.0 - 17.0 g/dL   HCT 45.2 39.0 - 52.0 %   MCV 95.0 78.0 - 100.0 fL   MCH 32.4 26.0 - 34.0 pg   MCHC 34.1 30.0 - 36.0 g/dL   RDW 13.3 11.5 - 15.5 %   Platelets 308 150 - 400 K/uL   Neutrophils Relative % 75 43 - 77 %   Lymphocytes Relative 12 12 - 46 %   Monocytes Relative 12 3 - 12 %   Eosinophils Relative 1 0 - 5  %   Basophils Relative 0 0 - 1 %   Neutro Abs 14.1 (H) 1.7 - 7.7 K/uL   Lymphs Abs 2.3 0.7 - 4.0 K/uL   Monocytes Absolute 2.3 (H) 0.1 - 1.0 K/uL   Eosinophils Absolute 0.2 0.0 - 0.7 K/uL   Basophils Absolute 0.0 0.0 - 0.1 K/uL   WBC Morphology ATYPICAL LYMPHOCYTES     Comment: WHITE COUNT CONFIRMED ON SMEAR   Smear Review LARGE PLATELETS PRESENT   Comprehensive metabolic panel     Status: Abnormal   Collection Time: 12/26/14 12:28 PM  Result Value Ref Range   Sodium 133 (L) 135 - 145 mmol/L   Potassium 3.9 3.5 - 5.1 mmol/L   Chloride 102 96 - 112 mmol/L   CO2 24 19 - 32 mmol/L   Glucose, Bld 116 (H) 70 - 99 mg/dL   BUN 8 6 - 23 mg/dL   Creatinine, Ser 0.81 0.50 - 1.35 mg/dL   Calcium 9.0 8.4 - 10.5 mg/dL   Total Protein 6.9 6.0 - 8.3 g/dL   Albumin 3.6 3.5 - 5.2 g/dL   AST 25 0 - 37 U/L   ALT 24 0 - 53 U/L   Alkaline Phosphatase 98  39 - 117 U/L   Total Bilirubin 0.6 0.3 - 1.2 mg/dL   GFR calc non Af Amer >90 >90 mL/min   GFR calc Af Amer >90 >90 mL/min    Comment: (NOTE) The eGFR has been calculated using the CKD EPI equation. This calculation has not been validated in all clinical situations. eGFR's persistently <90 mL/min signify possible Chronic Kidney Disease.    Anion gap 7 5 - 15  Urinalysis, Routine w reflex microscopic     Status: Abnormal   Collection Time: 12/26/14 11:00 PM  Result Value Ref Range   Color, Urine AMBER (A) YELLOW    Comment: BIOCHEMICALS MAY BE AFFECTED BY COLOR   APPearance CLEAR CLEAR   Specific Gravity, Urine 1.010 1.005 - 1.030   pH 6.0 5.0 - 8.0   Glucose, UA NEGATIVE NEGATIVE mg/dL   Hgb urine dipstick NEGATIVE NEGATIVE   Bilirubin Urine NEGATIVE NEGATIVE   Ketones, ur NEGATIVE NEGATIVE mg/dL   Protein, ur NEGATIVE NEGATIVE mg/dL   Urobilinogen, UA 0.2 0.0 - 1.0 mg/dL   Nitrite NEGATIVE NEGATIVE   Leukocytes, UA NEGATIVE NEGATIVE    Comment: MICROSCOPIC NOT DONE ON URINES WITH NEGATIVE PROTEIN, BLOOD, LEUKOCYTES, NITRITE, OR GLUCOSE  <1000 mg/dL.  Basic metabolic panel     Status: Abnormal   Collection Time: 12/27/14  6:53 AM  Result Value Ref Range   Sodium 138 135 - 145 mmol/L   Potassium 3.7 3.5 - 5.1 mmol/L   Chloride 101 96 - 112 mmol/L   CO2 28 19 - 32 mmol/L   Glucose, Bld 113 (H) 70 - 99 mg/dL   BUN 6 6 - 23 mg/dL   Creatinine, Ser 0.85 0.50 - 1.35 mg/dL   Calcium 8.8 8.4 - 10.5 mg/dL   GFR calc non Af Amer >90 >90 mL/min   GFR calc Af Amer >90 >90 mL/min    Comment: (NOTE) The eGFR has been calculated using the CKD EPI equation. This calculation has not been validated in all clinical situations. eGFR's persistently <90 mL/min signify possible Chronic Kidney Disease.    Anion gap 9 5 - 15  CBC     Status: Abnormal   Collection Time: 12/27/14  6:53 AM  Result Value Ref Range   WBC 14.9 (H) 4.0 - 10.5 K/uL   RBC 4.75 4.22 - 5.81 MIL/uL   Hemoglobin 14.9 13.0 - 17.0 g/dL   HCT 45.9 39.0 - 52.0 %   MCV 96.6 78.0 - 100.0 fL   MCH 31.4 26.0 - 34.0 pg   MCHC 32.5 30.0 - 36.0 g/dL   RDW 13.4 11.5 - 15.5 %   Platelets 276 150 - 400 K/uL    Ct Abdomen Pelvis W Contrast  12/26/2014   CLINICAL DATA:  Follow-up diverticulitis with micro perforation  EXAM: CT ABDOMEN AND PELVIS WITH CONTRAST  TECHNIQUE: Multidetector CT imaging of the abdomen and pelvis was performed using the standard protocol following bolus administration of intravenous contrast.  CONTRAST:  189m OMNIPAQUE IOHEXOL 300 MG/ML  SOLN  COMPARISON:  12/08/2014  FINDINGS: There is mild thickening of distal esophageal wall probable from gastroesophageal reflux. Stable small cyst or hemangioma in right hepatic dome centrally. No calcified gallstones are noted within gallbladder. Sagittal images of the spine shows significant disc space flattening with vacuum disc phenomenon and mild anterior and posterior spurring at L5-S1 level. Stable degenerative changes thoracolumbar spine. No aortic aneurysm.  The pancreas, spleen and adrenal glands are unremarkable.  Kidneys are symmetrical in size and enhancement.  Left renal cyst is stable. No aortic aneurysm. Normal appendix. No small bowel obstruction. No ascites or free abdominal air.  Again noted focal thickening of sigmoid colon wall in left lower quadrant with worsening from prior exam. There is worsening of pericolonic inflammatory changes. Again noted small contained perforation. There is a pericolonic abscess measures 2.6 cm with small air-fluid level. This is best visualized in sagittal image 68. No distal colonic obstruction. No inguinal adenopathy. The urinary bladder is unremarkable. Small right inguinal scrotal canal hernia containing fat without evidence of acute complication measures about 2.7 cm. Delayed renal images shows bilateral renal symmetrical excretion. Bilateral visualized proximal ureter is unremarkable.  IMPRESSION: 1. Again noted inflammatory changes proximal sigmoid colon with focal thickening of sigmoid colon wall with worsening from prior exam. Again noted small contained perforation. There is now a pericolonic abscess with air-fluid level measures about 2.6 cm. Findings are consistent with worsening focal colitis or diverticulitis. 2. No small bowel obstruction. 3. No pericecal inflammation.  Normal appendix. 4. No hydronephrosis or hydroureter. These results were called by telephone at the time of interpretation on 12/26/2014 at 11:19 am to Dr. Jenna Luo , who verbally acknowledged these results.   Electronically Signed   By: Lahoma Crocker M.D.   On: 12/26/2014 11:20    ROS: See chart Blood pressure 109/73, pulse 88, temperature 98.6 F (37 C), temperature source Oral, resp. rate 18, height 5' 8"  (1.727 m), weight 85.911 kg (189 lb 6.4 oz), SpO2 97 %. Physical Exam: Pleasant white male in no acute distress. Abdomen is soft with nonspecific tenderness in the left lower quadrant and suprapubic region. No rigidity is noted. No masses are noted.  Assessment/Plan: Impression: Sigmoid  diverticulitis with contained abscess Plan: Would referred to interventional radiology for possible CT-guided drainage of the abscess. Would continue antibiotics as prescribed. Continue bowel rest for now.  Addendum: Interventional radiology does not feel that this needs to be drained at this time.  Kasean Denherder A 12/27/2014, 9:38 AM

## 2014-12-27 NOTE — Care Management Utilization Note (Signed)
UR completed 

## 2014-12-28 DIAGNOSIS — K529 Noninfective gastroenteritis and colitis, unspecified: Secondary | ICD-10-CM

## 2014-12-28 LAB — CBC
HCT: 44.2 % (ref 39.0–52.0)
Hemoglobin: 14.2 g/dL (ref 13.0–17.0)
MCH: 31.1 pg (ref 26.0–34.0)
MCHC: 32.1 g/dL (ref 30.0–36.0)
MCV: 96.9 fL (ref 78.0–100.0)
PLATELETS: 277 10*3/uL (ref 150–400)
RBC: 4.56 MIL/uL (ref 4.22–5.81)
RDW: 13.3 % (ref 11.5–15.5)
WBC: 11.2 10*3/uL — AB (ref 4.0–10.5)

## 2014-12-28 LAB — BASIC METABOLIC PANEL
Anion gap: 6 (ref 5–15)
BUN: 7 mg/dL (ref 6–23)
CO2: 30 mmol/L (ref 19–32)
Calcium: 8.7 mg/dL (ref 8.4–10.5)
Chloride: 103 mmol/L (ref 96–112)
Creatinine, Ser: 0.83 mg/dL (ref 0.50–1.35)
GFR calc Af Amer: >90 mL/min (ref >90)
GFR calc non Af Amer: >90 mL/min (ref >90)
GLUCOSE: 108 mg/dL — AB (ref 70–99)
Potassium: 3.9 mmol/L (ref 3.5–5.1)
Sodium: 139 mmol/L (ref 135–145)

## 2014-12-28 MED ORDER — AMOXICILLIN-POT CLAVULANATE 875-125 MG PO TABS: 1.0000 | ORAL_TABLET | Freq: Two times a day (BID) | ORAL | Status: DC

## 2014-12-28 MED ORDER — HYDROCODONE-ACETAMINOPHEN 5-325 MG PO TABS: 1.0000 | ORAL_TABLET | Freq: Four times a day (QID) | ORAL | Status: DC | PRN

## 2014-12-28 MED ORDER — AMOXICILLIN-POT CLAVULANATE 875-125 MG PO TABS
1.0000 | ORAL_TABLET | Freq: Two times a day (BID) | ORAL | Status: DC
  Administered 2014-12-28: 1 via ORAL
  Filled 2014-12-28: qty 1

## 2014-12-28 NOTE — Discharge Summary (Signed)
Physician Discharge Summary  [name] HWK:[id number] DOB: November 01, 1966 DOA: 12/26/2014  PCP: Odette Fraction, MD  Admit date: 12/26/2014 Discharge date: 12/28/2014  Time spent: 40 minutes  Recommendations for Outpatient Follow-up:  1. Follow up with Dr. Arnoldo Morale as scheduled to re-evaluate colitis/diverticular abscess.  2. Has been instructed to return for fever, worsening pain  Discharge Diagnoses:  Principal Problem:   Colonic diverticular abscess Active Problems:   Anxiety state   Essential hypertension   Tobacco abuse   Obesity   Colitis   ETOH abuse   Discharge Condition: stable  Diet recommendation: heart healthy  Filed Weights   12/26/14 1145 12/26/14 1715  Weight: 86.183 kg (190 lb) 85.911 kg (189 lb 6.4 oz)    History of present illness:  [name] is a 49 y.o. male patient with a history of diverticulitis with microperforation presented to  emergency department on 12/26/14 with the chief complaint of constant worsening abdominal pain. Initial evaluation revealed worsening proximal sigmoid colitis with a small contained perforation as well as pericolonic abscess.  Patient reported he was recently discharged the hospital for same and return to work the day before.  After working  he began to feel somewhat bloated developing abdominal pain and diaphoresis. He denieed nausea vomiting chest pain palpitation shortness of breath. He denied any fever chills or recent sick contacts. Reported having a BM the day prior and it was normal in color and consistency. He stateed the pain  located mostly in his lower abdomen he rated it a 5 out of 10.   Workup in the emergency department as a leukocytosis of 18.9 mild hyponatremia 133 mild hyperglycemia with a serum glucose of 116. Abdominal CT noted inflammatory changes proximal sigmoid colon worsening from prior exam with small contained perforation as well as pericolonic abscess with air-fluid. No small bowel obstruction no  hydronephrosis.  He was afebrile hemodynamically stable and not hypoxic. Provided with Cipro Flagyl morphine Zofran as well as a liter normal saline.  Hospital Course:  Colitis with microperf and Colonic diverticular abscess: patient reports non-compliance with antibiotics on previous discharge.  Evaluated by IR who opine no need for drain and recommend continuation of antibiotics and surveillance. He was given Cipro and Flagyl and bowel rest and quickly improved. Evaluated by general surgery who recommended discharge with augmentin 2 weeks and follow up general surgery in 2 weeks as well. At discharge leukocytosis trending down. He remained afebrile and non-toxic appearing. He is tolerating diet without problem.   Active Problems:  Anxiety state:  Remained stable at baseline.  Essential hypertension: Controlled.   Tobacco abuse: Cessation counseling offered   Obesity: BMI 28.1. Nutritional counseling  ETOH abuse. No signs/symptoms withdrawal. continue CIWA protocol and monitor closely.    Procedures:  none  Consultations:  General surgery  Discharge Exam: Filed Vitals:   12/28/14 0521  BP: 138/81  Pulse: 73  Temp: 98.1 F (36.7 C)  Resp: 18    General: well nourished appears comfortable Cardiovascular: RRR No MGR No LE edema Respiratory: normal effort BS clear bilaterally no wheeze Abdomen: non-distended +BS non-tender to palpation  Discharge Instructions   Discharge Instructions    Call MD for:  persistant nausea and vomiting    Complete by:  As directed      Call MD for:  severe uncontrolled pain    Complete by:  As directed      Call MD for:  temperature >100.4    Complete by:  As directed  Diet - low sodium heart healthy    Complete by:  As directed      Discharge instructions    Complete by:  As directed   Take medication as directed Follow up with Dr Arnoldo Morale in 2 weeks If develops worsening abdominal pain of fever seek medical attention      Increase activity slowly    Complete by:  As directed           Current Discharge Medication List    START taking these medications   Details  amoxicillin-clavulanate (AUGMENTIN) 875-125 MG per tablet Take 1 tablet by mouth every 12 (twelve) hours. Qty: 28 tablet, Refills: 0    HYDROcodone-acetaminophen (NORCO/VICODIN) 5-325 MG per tablet Take 1 tablet by mouth every 6 (six) hours as needed for severe pain. Qty: 20 tablet, Refills: 0      CONTINUE these medications which have NOT CHANGED   Details  atenolol (TENORMIN) 50 MG tablet TAKE 1 TABLET BY MOUTH EVERY DAY Qty: 90 tablet, Refills: 2    clonazePAM (KLONOPIN) 0.5 MG tablet TAKE 1 TABLET BY MOUTH TWICE A DAY AS NEEDED - MUST LAST 30 DAYS Qty: 30 tablet, Refills: 2    fluticasone (FLONASE) 50 MCG/ACT nasal spray Place 2 sprays into both nostrils daily. Qty: 16 g, Refills: 6    NASAL SALINE NA Place 1 spray into the nose daily as needed. For nose bleeds    simvastatin (ZOCOR) 40 MG tablet TAKE 1 TABLET BY MOUTH EVERY DAY Qty: 90 tablet, Refills: 2    venlafaxine XR (EFFEXOR-XR) 150 MG 24 hr capsule TAKE 1 CAPSULE (150 MG TOTAL) BY MOUTH EVERY MORNING. Qty: 90 capsule, Refills: 1    cyclobenzaprine (FLEXERIL) 10 MG tablet TAKE 1 TABLET BY MOUTH AT BEDTIME AS NEEDED Qty: 30 tablet, Refills: 1       No Known Allergies Follow-up Information    Follow up with Aviva Signs A, MD. Schedule an appointment as soon as possible for a visit on 01/11/2015.   Specialty:  General Surgery   Contact information:   [address] [place] [address] [phone]        The results of significant diagnostics from this hospitalization (including imaging, microbiology, ancillary and laboratory) are listed below for reference.    Significant Diagnostic Studies: Ct Abdomen Pelvis W Contrast  12/26/2014   CLINICAL DATA:  Follow-up diverticulitis with micro perforation  EXAM: CT ABDOMEN AND PELVIS WITH CONTRAST  TECHNIQUE:  Multidetector CT imaging of the abdomen and pelvis was performed using the standard protocol following bolus administration of intravenous contrast.  CONTRAST:  161mL OMNIPAQUE IOHEXOL 300 MG/ML  SOLN  COMPARISON:  12/08/2014  FINDINGS: There is mild thickening of distal esophageal wall probable from gastroesophageal reflux. Stable small cyst or hemangioma in right hepatic dome centrally. No calcified gallstones are noted within gallbladder. Sagittal images of the spine shows significant disc space flattening with vacuum disc phenomenon and mild anterior and posterior spurring at L5-S1 level. Stable degenerative changes thoracolumbar spine. No aortic aneurysm.  The pancreas, spleen and adrenal glands are unremarkable. Kidneys are symmetrical in size and enhancement. Left renal cyst is stable. No aortic aneurysm. Normal appendix. No small bowel obstruction. No ascites or free abdominal air.  Again noted focal thickening of sigmoid colon wall in left lower quadrant with worsening from prior exam. There is worsening of pericolonic inflammatory changes. Again noted small contained perforation. There is a pericolonic abscess measures 2.6 cm with small air-fluid level. This is best visualized in  sagittal image 68. No distal colonic obstruction. No inguinal adenopathy. The urinary bladder is unremarkable. Small right inguinal scrotal canal hernia containing fat without evidence of acute complication measures about 2.7 cm. Delayed renal images shows bilateral renal symmetrical excretion. Bilateral visualized proximal ureter is unremarkable.  IMPRESSION: 1. Again noted inflammatory changes proximal sigmoid colon with focal thickening of sigmoid colon wall with worsening from prior exam. Again noted small contained perforation. There is now a pericolonic abscess with air-fluid level measures about 2.6 cm. Findings are consistent with worsening focal colitis or diverticulitis. 2. No small bowel obstruction. 3. No pericecal  inflammation.  Normal appendix. 4. No hydronephrosis or hydroureter. These results were called by telephone at the time of interpretation on 12/26/2014 at 11:19 am to Dr. Jenna Luo , who verbally acknowledged these results.   Electronically Signed   By: Lahoma Crocker M.D.   On: 12/26/2014 11:20   Ct Abdomen Pelvis W Contrast  12/08/2014   CLINICAL DATA:  49 year old with lower abdominal pain for 1 year. Pain has been worse for 4 days. Patient has an elevated white blood cell count.  EXAM: CT ABDOMEN AND PELVIS WITH CONTRAST  TECHNIQUE: Multidetector CT imaging of the abdomen and pelvis was performed using the standard protocol following bolus administration of intravenous contrast.  CONTRAST:  184mL OMNIPAQUE IOHEXOL 300 MG/ML  SOLN  COMPARISON:  None.  FINDINGS: The lung bases are clear.  0.6 cm hypodensity at the hepatic dome is nonspecific but probably represents a small cyst. No suspicious liver lesions. Normal appearance of the gallbladder and the portal venous system is patent. Normal appearance of the pancreas and spleen and both adrenal glands. Mild stranding around both kidneys which could be chronic. No hydronephrosis. 3.1 cm exophytic low-density cyst in the left kidney mid pole region. There is indeterminate 1.1 cm low-density lesion along the anterior left kidney. This anterior low-density structure has Hounsfield units of roughly 29 which are nonspecific.  Few calcifications in the prostate. Normal appearance of the urinary bladder. There is wall thickening and inflammatory changes around the sigmoid colon. Small foci of extraluminal gas along the posterior sigmoid colon on sequence 2, image 69. Findings suggest a small perforation without an abscess. Normal appearance of the appendix. Small lymph nodes in the mesentery on sequence 2, image 53 could be reactive.  Evidence for healing fractures involving the left tenth and ninth ribs. Disc space narrowing with vacuum disc at L5-S1. No acute bone  abnormality.  IMPRESSION: Sigmoid colon wall thickening with adjacent extraluminal gas. Findings are compatible with acute colitis with a perforation. Findings could represent acute diverticulitis. No evidence for an abscess collection.  Probable left renal cysts. However, the 1.1 cm structure along the anterior left kidney is indeterminate based on the Hounsfield units. This could be more definitively evaluated with an MRI. Alternatively, it may be large enough to be evaluated with ultrasound.  These results were called by telephone at the time of interpretation on 12/08/2014 at 3:46 pm to Dr. Roderic Palau , who verbally acknowledged these results.   Electronically Signed   By: Markus Daft M.D.   On: 12/08/2014 15:48    Microbiology: No results found for this or any previous visit (from the past 240 hour(s)).   Labs: Basic Metabolic Panel:  Recent Labs Lab 12/26/14 1228 12/27/14 0653 12/28/14 0703  NA 133* 138 139  K 3.9 3.7 3.9  CL 102 101 103  CO2 24 28 30   GLUCOSE 116* 113* 108*  BUN 8  6 7  CREATININE 0.81 0.85 0.83  CALCIUM 9.0 8.8 8.7   Liver Function Tests:  Recent Labs Lab 12/26/14 1228  AST 25  ALT 24  ALKPHOS 98  BILITOT 0.6  PROT 6.9  ALBUMIN 3.6   No results for input(s): LIPASE, AMYLASE in the last 168 hours. No results for input(s): AMMONIA in the last 168 hours. CBC:  Recent Labs Lab 12/26/14 0823 12/26/14 1228 12/27/14 0653 12/28/14 0703  WBC 18.3* 18.9* 14.9* 11.2*  NEUTROABS 14.6* 14.1*  --   --   HGB 16.5 15.4 14.9 14.2  HCT 49.6 45.2 45.9 44.2  MCV 100.6* 95.0 96.6 96.9  PLT 317 308 276 277   Cardiac Enzymes: No results for input(s): CKTOTAL, CKMB, CKMBINDEX, TROPONINI in the last 168 hours. BNP: BNP (last 3 results) No results for input(s): BNP in the last 8760 hours.  ProBNP (last 3 results) No results for input(s): PROBNP in the last 8760 hours.  CBG: No results for input(s): GLUCAP in the last 168 hours.     SignedRadene Gunning  [facility] 12/28/2014, 10:34 AM

## 2014-12-28 NOTE — Progress Notes (Signed)
Subjective: **Feels better.  No significant abdominal pain.*  Objective: Vital signs in last 24 hours: Temp:  [98.1 F (36.7 C)-98.6 F (37 C)] 98.1 F (36.7 C) (02/04 0521) Pulse Rate:  [71-78] 73 (02/04 0521) Resp:  [18] 18 (02/04 0521) BP: (114-147)/(66-89) 138/81 mmHg (02/04 0521) SpO2:  [97 %-99 %] 97 % (02/04 0521) Last BM Date: 12/26/14  Intake/Output from previous day:   Intake/Output this shift:    General appearance: alert, cooperative and no distress GI: Soft, minimal tenderness in lower abdomen.  No rigidity noted.  Lab Results:   Recent Labs  12/27/14 0653 12/28/14 0703  WBC 14.9* 11.2*  HGB 14.9 14.2  HCT 45.9 44.2  PLT 276 277   BMET  Recent Labs  12/27/14 0653 12/28/14 0703  NA 138 139  K 3.7 3.9  CL 101 103  CO2 28 30  GLUCOSE 113* 108*  BUN 6 7  CREATININE 0.85 0.83  CALCIUM 8.8 8.7   PT/INR  Recent Labs  12/27/14 1029  LABPROT 15.2  INR 1.19    Studies/Results: Ct Abdomen Pelvis W Contrast  12/26/2014   CLINICAL DATA:  Follow-up diverticulitis with micro perforation  EXAM: CT ABDOMEN AND PELVIS WITH CONTRAST  TECHNIQUE: Multidetector CT imaging of the abdomen and pelvis was performed using the standard protocol following bolus administration of intravenous contrast.  CONTRAST:  148mL OMNIPAQUE IOHEXOL 300 MG/ML  SOLN  COMPARISON:  12/08/2014  FINDINGS: There is mild thickening of distal esophageal wall probable from gastroesophageal reflux. Stable small cyst or hemangioma in right hepatic dome centrally. No calcified gallstones are noted within gallbladder. Sagittal images of the spine shows significant disc space flattening with vacuum disc phenomenon and mild anterior and posterior spurring at L5-S1 level. Stable degenerative changes thoracolumbar spine. No aortic aneurysm.  The pancreas, spleen and adrenal glands are unremarkable. Kidneys are symmetrical in size and enhancement. Left renal cyst is stable. No aortic aneurysm. Normal  appendix. No small bowel obstruction. No ascites or free abdominal air.  Again noted focal thickening of sigmoid colon wall in left lower quadrant with worsening from prior exam. There is worsening of pericolonic inflammatory changes. Again noted small contained perforation. There is a pericolonic abscess measures 2.6 cm with small air-fluid level. This is best visualized in sagittal image 68. No distal colonic obstruction. No inguinal adenopathy. The urinary bladder is unremarkable. Small right inguinal scrotal canal hernia containing fat without evidence of acute complication measures about 2.7 cm. Delayed renal images shows bilateral renal symmetrical excretion. Bilateral visualized proximal ureter is unremarkable.  IMPRESSION: 1. Again noted inflammatory changes proximal sigmoid colon with focal thickening of sigmoid colon wall with worsening from prior exam. Again noted small contained perforation. There is now a pericolonic abscess with air-fluid level measures about 2.6 cm. Findings are consistent with worsening focal colitis or diverticulitis. 2. No small bowel obstruction. 3. No pericecal inflammation.  Normal appendix. 4. No hydronephrosis or hydroureter. These results were called by telephone at the time of interpretation on 12/26/2014 at 11:19 am to Dr. Jenna Luo , who verbally acknowledged these results.   Electronically Signed   By: Lahoma Crocker M.D.   On: 12/26/2014 11:20    Anti-infectives: Anti-infectives    Start     Dose/Rate Route Frequency Ordered Stop   12/28/14 1015  amoxicillin-clavulanate (AUGMENTIN) 875-125 MG per tablet 1 tablet     1 tablet Oral Every 12 hours 12/28/14 1002     12/28/14 0000  amoxicillin-clavulanate (AUGMENTIN) 875-125 MG per  tablet  Status:  Discontinued     1 tablet Oral Every 12 hours 12/28/14 1005 12/28/14    12/28/14 0000  amoxicillin-clavulanate (AUGMENTIN) 875-125 MG per tablet     1 tablet Oral Every 12 hours 12/28/14 1011     12/27/14 0200   ciprofloxacin (CIPRO) IVPB 400 mg  Status:  Discontinued     400 mg200 mL/hr over 60 Minutes Intravenous Every 12 hours 12/26/14 1704 12/28/14 1002   12/26/14 2200  metroNIDAZOLE (FLAGYL) IVPB 500 mg  Status:  Discontinued     500 mg100 mL/hr over 60 Minutes Intravenous Every 8 hours 12/26/14 1701 12/28/14 1002   12/26/14 1700  metroNIDAZOLE (FLAGYL) IVPB 500 mg  Status:  Discontinued     500 mg100 mL/hr over 60 Minutes Intravenous Every 8 hours 12/26/14 1650 12/26/14 1701   12/26/14 1330  ciprofloxacin (CIPRO) IVPB 400 mg     400 mg200 mL/hr over 60 Minutes Intravenous  Once 12/26/14 1315 12/26/14 1514   12/26/14 1315  metroNIDAZOLE (FLAGYL) IVPB 500 mg     500 mg100 mL/hr over 60 Minutes Intravenous  Once 12/26/14 1315 12/26/14 1513      Assessment/Plan: Imp:  Sigmoid diverticulitis.  This could not be drained by IR as it was felt to be intramural.  There is also a question as to whether patient completed the original course of antibiotic.  Plan:  OK for discharge if he tolerates regular diet.  Augmentin for two weeks.  Will see in my office as an outpatient in two weeks.  LOS: 2 days    Rickelle Sylvestre A 12/28/2014

## 2014-12-28 NOTE — Progress Notes (Signed)
Patient discharged home with instructions given on medications,and follow up visits,patient verbalized understanding. Prescriptions sent with patient. Accompanied by staff to an awaiting vehicle. 

## 2014-12-30 ENCOUNTER — Inpatient Hospital Stay (HOSPITAL_COMMUNITY)
Admission: EM | Admit: 2014-12-30 | Discharge: 2015-01-05 | DRG: 330 | Disposition: A | Discharge status: Home / Self Care | Payer: BLUE CROSS/BLUE SHIELD | Attending: General Surgery | Admitting: General Surgery

## 2014-12-30 ENCOUNTER — Encounter (HOSPITAL_COMMUNITY): Payer: Self-pay | Admitting: Cardiology

## 2014-12-30 ENCOUNTER — Emergency Department (HOSPITAL_COMMUNITY): Payer: BLUE CROSS/BLUE SHIELD

## 2014-12-30 DIAGNOSIS — F411 Generalized anxiety disorder: Secondary | ICD-10-CM | POA: Diagnosis present

## 2014-12-30 DIAGNOSIS — R739 Hyperglycemia, unspecified: Secondary | ICD-10-CM | POA: Diagnosis present

## 2014-12-30 DIAGNOSIS — Z8249 Family history of ischemic heart disease and other diseases of the circulatory system: Secondary | ICD-10-CM | POA: Diagnosis not present

## 2014-12-30 DIAGNOSIS — Z823 Family history of stroke: Secondary | ICD-10-CM | POA: Diagnosis not present

## 2014-12-30 DIAGNOSIS — K5732 Diverticulitis of large intestine without perforation or abscess without bleeding: Secondary | ICD-10-CM

## 2014-12-30 DIAGNOSIS — R000 Tachycardia, unspecified: Secondary | ICD-10-CM | POA: Diagnosis not present

## 2014-12-30 DIAGNOSIS — F17210 Nicotine dependence, cigarettes, uncomplicated: Secondary | ICD-10-CM | POA: Diagnosis present

## 2014-12-30 DIAGNOSIS — Z792 Long term (current) use of antibiotics: Secondary | ICD-10-CM

## 2014-12-30 DIAGNOSIS — K5720 Diverticulitis of large intestine with perforation and abscess without bleeding: Principal | ICD-10-CM | POA: Diagnosis present

## 2014-12-30 DIAGNOSIS — Z720 Tobacco use: Secondary | ICD-10-CM | POA: Diagnosis present

## 2014-12-30 DIAGNOSIS — F1010 Alcohol abuse, uncomplicated: Secondary | ICD-10-CM | POA: Diagnosis present

## 2014-12-30 DIAGNOSIS — R1031 Right lower quadrant pain: Secondary | ICD-10-CM | POA: Diagnosis present

## 2014-12-30 DIAGNOSIS — I10 Essential (primary) hypertension: Secondary | ICD-10-CM | POA: Diagnosis present

## 2014-12-30 DIAGNOSIS — Z79891 Long term (current) use of opiate analgesic: Secondary | ICD-10-CM | POA: Diagnosis not present

## 2014-12-30 DIAGNOSIS — R109 Unspecified abdominal pain: Secondary | ICD-10-CM

## 2014-12-30 DIAGNOSIS — K389 Disease of appendix, unspecified: Secondary | ICD-10-CM | POA: Diagnosis present

## 2014-12-30 HISTORY — DX: Diverticulitis of large intestine with perforation and abscess without bleeding: K57.20

## 2014-12-30 LAB — CBC WITH DIFFERENTIAL/PLATELET
BASOS PCT: 0 % (ref 0–1)
Basophils Absolute: 0 10*3/uL (ref 0.0–0.1)
EOS ABS: 0.1 10*3/uL (ref 0.0–0.7)
EOS PCT: 1 % (ref 0–5)
HCT: 44.9 % (ref 39.0–52.0)
Hemoglobin: 14.8 g/dL (ref 13.0–17.0)
Lymphocytes Relative: 8 % — ABNORMAL LOW (ref 12–46)
Lymphs Abs: 1.3 10*3/uL (ref 0.7–4.0)
MCH: 31.6 pg (ref 26.0–34.0)
MCHC: 33 g/dL (ref 30.0–36.0)
MCV: 95.9 fL (ref 78.0–100.0)
Monocytes Absolute: 1.3 10*3/uL — ABNORMAL HIGH (ref 0.1–1.0)
Monocytes Relative: 8 % (ref 3–12)
Neutro Abs: 13.5 10*3/uL — ABNORMAL HIGH (ref 1.7–7.7)
Neutrophils Relative %: 83 % — ABNORMAL HIGH (ref 43–77)
PLATELETS: 295 10*3/uL (ref 150–400)
RBC: 4.68 MIL/uL (ref 4.22–5.81)
RDW: 13.1 % (ref 11.5–15.5)
WBC: 16.1 10*3/uL — AB (ref 4.0–10.5)

## 2014-12-30 LAB — URINALYSIS, ROUTINE W REFLEX MICROSCOPIC
Bilirubin Urine: NEGATIVE
GLUCOSE, UA: NEGATIVE mg/dL
Hgb urine dipstick: NEGATIVE
LEUKOCYTES UA: NEGATIVE
Nitrite: NEGATIVE
PROTEIN: NEGATIVE mg/dL
Specific Gravity, Urine: 1.025 (ref 1.005–1.030)
UROBILINOGEN UA: 0.2 mg/dL (ref 0.0–1.0)
pH: 6 (ref 5.0–8.0)

## 2014-12-30 LAB — TYPE AND SCREEN
ABO/RH(D): O POS
Antibody Screen: NEGATIVE

## 2014-12-30 LAB — BASIC METABOLIC PANEL
ANION GAP: 4 — AB (ref 5–15)
BUN: 11 mg/dL (ref 6–23)
CO2: 26 mmol/L (ref 19–32)
Calcium: 8.4 mg/dL (ref 8.4–10.5)
Chloride: 109 mmol/L (ref 96–112)
Creatinine, Ser: 0.79 mg/dL (ref 0.50–1.35)
GLUCOSE: 130 mg/dL — AB (ref 70–99)
Potassium: 4.2 mmol/L (ref 3.5–5.1)
Sodium: 139 mmol/L (ref 135–145)

## 2014-12-30 LAB — I-STAT CG4 LACTIC ACID, ED: Lactic Acid, Venous: 1.2 mmol/L (ref 0.5–2.0)

## 2014-12-30 LAB — ETHANOL

## 2014-12-30 MED ORDER — FLUTICASONE PROPIONATE 50 MCG/ACT NA SUSP
2.0000 | Freq: Every day | NASAL | Status: DC
  Administered 2014-12-31 – 2015-01-05 (×6): 2 via NASAL
  Filled 2014-12-30 (×2): qty 16

## 2014-12-30 MED ORDER — PIPERACILLIN-TAZOBACTAM 3.375 G IVPB
3.3750 g | Freq: Three times a day (TID) | INTRAVENOUS | Status: DC
  Administered 2014-12-31 – 2015-01-05 (×17): 3.375 g via INTRAVENOUS
  Filled 2014-12-30 (×26): qty 50

## 2014-12-30 MED ORDER — CYCLOBENZAPRINE HCL 10 MG PO TABS: 10.0000 mg | ORAL_TABLET | Freq: Three times a day (TID) | ORAL | Status: DC | PRN

## 2014-12-30 MED ORDER — ALUM & MAG HYDROXIDE-SIMETH 200-200-20 MG/5ML PO SUSP
30.0000 mL | Freq: Four times a day (QID) | ORAL | Status: DC | PRN
  Administered 2015-01-04 – 2015-01-05 (×4): 30 mL via ORAL
  Filled 2014-12-30 (×5): qty 30

## 2014-12-30 MED ORDER — SIMVASTATIN 20 MG PO TABS
40.0000 mg | ORAL_TABLET | Freq: Every day | ORAL | Status: DC
  Filled 2014-12-30: qty 2

## 2014-12-30 MED ORDER — PIPERACILLIN-TAZOBACTAM 3.375 G IVPB 30 MIN
3.3750 g | Freq: Once | INTRAVENOUS | Status: AC
  Administered 2014-12-30: 3.375 g via INTRAVENOUS
  Filled 2014-12-30: qty 50

## 2014-12-30 MED ORDER — CLONAZEPAM 0.5 MG PO TABS
0.5000 mg | ORAL_TABLET | Freq: Two times a day (BID) | ORAL | Status: DC | PRN
  Administered 2014-12-30: 0.5 mg via ORAL
  Filled 2014-12-30: qty 1

## 2014-12-30 MED ORDER — SODIUM CHLORIDE 0.9 % IV SOLN: INTRAVENOUS | Status: AC

## 2014-12-30 MED ORDER — DOCUSATE SODIUM 100 MG PO CAPS: 100.0000 mg | ORAL_CAPSULE | Freq: Every day | ORAL | Status: DC | PRN

## 2014-12-30 MED ORDER — ONDANSETRON HCL 4 MG/2ML IJ SOLN
4.0000 mg | Freq: Once | INTRAMUSCULAR | Status: AC
  Administered 2014-12-30: 4 mg via INTRAVENOUS
  Filled 2014-12-30: qty 2

## 2014-12-30 MED ORDER — HYDROMORPHONE HCL 1 MG/ML IJ SOLN
1.0000 mg | INTRAMUSCULAR | Status: DC | PRN
  Administered 2014-12-30: 1 mg via INTRAVENOUS
  Filled 2014-12-30: qty 1

## 2014-12-30 MED ORDER — ACETAMINOPHEN 325 MG PO TABS
650.0000 mg | ORAL_TABLET | Freq: Four times a day (QID) | ORAL | Status: DC | PRN
  Administered 2014-12-31 – 2015-01-03 (×2): 650 mg via ORAL
  Filled 2014-12-30 (×2): qty 2

## 2014-12-30 MED ORDER — SODIUM CHLORIDE 0.9 % IV SOLN
INTRAVENOUS | Status: DC
  Administered 2014-12-30: 17:00:00 via INTRAVENOUS

## 2014-12-30 MED ORDER — ONDANSETRON HCL 4 MG/2ML IJ SOLN
4.0000 mg | Freq: Four times a day (QID) | INTRAMUSCULAR | Status: DC | PRN
  Administered 2015-01-01: 4 mg via INTRAVENOUS

## 2014-12-30 MED ORDER — PIPERACILLIN-TAZOBACTAM 3.375 G IVPB
INTRAVENOUS | Status: AC
  Filled 2014-12-30: qty 100

## 2014-12-30 MED ORDER — VENLAFAXINE HCL ER 75 MG PO CP24
150.0000 mg | ORAL_CAPSULE | Freq: Every day | ORAL | Status: DC
Start: 2014-12-31 — End: 2014-12-31
  Administered 2014-12-31: 150 mg via ORAL
  Filled 2014-12-30: qty 2

## 2014-12-30 MED ORDER — HYDROMORPHONE HCL 1 MG/ML IJ SOLN
1.0000 mg | INTRAMUSCULAR | Status: DC | PRN
  Administered 2014-12-30 – 2015-01-01 (×12): 1 mg via INTRAVENOUS
  Filled 2014-12-30 (×14): qty 1

## 2014-12-30 MED ORDER — SODIUM CHLORIDE 0.9 % IV BOLUS (SEPSIS)
2000.0000 mL | Freq: Once | INTRAVENOUS | Status: AC
  Administered 2014-12-30: 2000 mL via INTRAVENOUS

## 2014-12-30 MED ORDER — NICOTINE 14 MG/24HR TD PT24
14.0000 mg | MEDICATED_PATCH | Freq: Every day | TRANSDERMAL | Status: DC
  Administered 2014-12-30 – 2015-01-04 (×6): 14 mg via TRANSDERMAL
  Filled 2014-12-30 (×6): qty 1

## 2014-12-30 MED ORDER — ATENOLOL 25 MG PO TABS
50.0000 mg | ORAL_TABLET | Freq: Every day | ORAL | Status: DC
  Filled 2014-12-30: qty 2

## 2014-12-30 MED ORDER — SODIUM CHLORIDE 0.9 % IV SOLN
INTRAVENOUS | Status: DC
  Administered 2014-12-31: 01:00:00 via INTRAVENOUS

## 2014-12-30 MED ORDER — HYDROMORPHONE HCL 1 MG/ML IJ SOLN
1.0000 mg | Freq: Once | INTRAMUSCULAR | Status: AC
  Administered 2014-12-30: 1 mg via INTRAVENOUS
  Filled 2014-12-30: qty 1

## 2014-12-30 MED ORDER — ONDANSETRON HCL 4 MG PO TABS: 4.0000 mg | ORAL_TABLET | Freq: Four times a day (QID) | ORAL | Status: DC | PRN

## 2014-12-30 MED ORDER — ACETAMINOPHEN 650 MG RE SUPP: 650.0000 mg | Freq: Four times a day (QID) | RECTAL | Status: DC | PRN

## 2014-12-30 NOTE — ED Notes (Signed)
Pt taken to CT via Penni Bombard, RN

## 2014-12-30 NOTE — Progress Notes (Signed)
ANTIBIOTIC CONSULT NOTE  Pharmacy Consult for Zosyn Indication: intra-abdominal infection  No Known Allergies  Patient Measurements:   Last Recorded Body weight: 85.9kg  Vital Signs: Temp: 97.5 F (36.4 C) (02/06 1625) Temp Source: Oral (02/06 1625) BP: 133/76 mmHg (02/06 1821) Pulse Rate: 93 (02/06 1821) Intake/Output from previous day:   Intake/Output from this shift:    Labs:  Recent Labs  12/28/14 0703 12/30/14 1609  WBC 11.2* 16.1*  HGB 14.2 14.8  PLT 277 295  CREATININE 0.83 0.79   Estimated Creatinine Clearance: 117.8 mL/min (by C-G formula based on Cr of 0.79). No results for input(s): VANCOTROUGH, VANCOPEAK, VANCORANDOM, GENTTROUGH, GENTPEAK, GENTRANDOM, TOBRATROUGH, TOBRAPEAK, TOBRARND, AMIKACINPEAK, AMIKACINTROU, AMIKACIN in the last 72 hours.   Microbiology: No results found for this or any previous visit (from the past 720 hour(s)).  Anti-infectives    Start     Dose/Rate Route Frequency Ordered Stop   12/31/14 0200  piperacillin-tazobactam (ZOSYN) IVPB 3.375 g     3.375 g12.5 mL/hr over 240 Minutes Intravenous Every 8 hours 12/30/14 2130     12/30/14 1815  piperacillin-tazobactam (ZOSYN) IVPB 3.375 g     3.375 g100 mL/hr over 30 Minutes Intravenous  Once 12/30/14 1800 12/30/14 1925      Assessment: [name] who presents with severe LLQ pain.  He was recently discharged on 2/4 on Augmentin with diverticulitis. Pharmacy asked to resume IV antibiotics for diverticulosis of colon with microperforation.   WBC elevated, but patient remains afebrile and lactic acid is normal.   Renal function is at patient's baseline.   Zosyn 2/6>> Augmentin 2/4>>2/6 Cipro 2/2>>2/4 Flagyl 2/2>>2/4  Goal of Therapy:  Eradicate infection.  Plan:  Zosyn 3.375gm IV Q8h to be infused over 4hrs Monitor renal function and cx data   Biagio Borg 12/30/2014,9:31 PM

## 2014-12-30 NOTE — ED Notes (Addendum)
Just discharged from hospital with diverticulitis.  Sudden onset of RLQ abdominal pain at 1pm today.  EMS gave 6 mg morphine and 500 cc normal saline bolus.

## 2014-12-30 NOTE — ED Notes (Signed)
Pt not able to void at this time and refuses in and out cath.  Pt would like to try in 30 minutes to void.

## 2014-12-30 NOTE — H&P (Signed)
History and Physical  [name] MWN:[id number] DOB: [birth year] DOA: 12/30/2014  Referring physician: Dr Eulis Foster, ED physician PCP: Jonathon Fraction, MD   Chief Complaint: Abdominal pain  HPI: [name] is a 49 y.o. male  With a history of hypertension, generalized anxiety disorder, diverticular disease with a recent hospitalization for diverticulitis with a discharge 2 days ago on Augmentin. Patient had no problems until this afternoon at approximate 1pm. The patient began having increasing left lower quadrant pain.  A couple hours later, patient felt a pop and had severe pain. Patient was brought to the hospital for evaluation.  movement increases pain. Rest improves pain.   Review of Systems:   Pt complains of Left lower quadrant pain.  Pt denies anyfevers, chills, nausea, vomiting, S pain, shortness of breath, cough, wheeze, diarrhea, constipation.  Review of systems are otherwise negative  Past Medical History  Diagnosis Date  . Allergy   . Hypertension   . GAD (generalized anxiety disorder)   . Diverticulitis   . ETOH abuse    Past Surgical History  Procedure Laterality Date  . Rotator cuff repair    . Hernia repair    . Hemorrhoid surgery     Social History:  reports that he has been smoking Cigarettes.  He has been smoking about 1.00 pack per day. He has never used smokeless tobacco. He reports that he drinks about 2.4 oz of alcohol per week. He reports that he does not use illicit drugs. Patient lives at home  & is able to participate in activities of daily living  No Known Allergies  Family History  Problem Relation Age of Onset  . Stroke Mother   . CAD Father       Prior to Admission medications   Medication Sig Start Date End Date Taking? Authorizing Provider  amoxicillin-clavulanate (AUGMENTIN) 875-125 MG per tablet Take 1 tablet by mouth every 12 (twelve) hours. 12/28/14  Yes Lezlie Octave Black, NP  atenolol (TENORMIN) 50 MG tablet TAKE 1 TABLET  BY MOUTH EVERY DAY 10/25/14  Yes Susy Frizzle, MD  clonazePAM (KLONOPIN) 0.5 MG tablet TAKE 1 TABLET BY MOUTH TWICE A DAY AS NEEDED - MUST LAST 30 DAYS 09/28/14  Yes Susy Frizzle, MD  cyclobenzaprine (FLEXERIL) 10 MG tablet TAKE 1 TABLET BY MOUTH AT BEDTIME AS NEEDED 10/29/13  Yes Susy Frizzle, MD  fluticasone [facility]) 50 MCG/ACT nasal spray Place 2 sprays into both nostrils daily. 03/09/14  Yes Susy Frizzle, MD  HYDROcodone-acetaminophen (NORCO/VICODIN) 5-325 MG per tablet Take 1 tablet by mouth every 6 (six) hours as needed for severe pain. 12/28/14  Yes Lezlie Octave Black, NP  simvastatin (ZOCOR) 40 MG tablet TAKE 1 TABLET BY MOUTH EVERY DAY 10/25/14  Yes Susy Frizzle, MD  venlafaxine XR (EFFEXOR-XR) 150 MG 24 hr capsule TAKE 1 CAPSULE (150 MG TOTAL) BY MOUTH EVERY MORNING.   Yes Susy Frizzle, MD  NASAL SALINE NA Place 1 spray into the nose daily as needed. For nose bleeds    Historical Provider, MD    Physical Exam: BP 133/76 mmHg  Pulse 93  Temp(Src) 97.5 F (36.4 C) (Oral)  Resp 18  SpO2 100%  General:Middle-aged Caucasian [place] and alert and oriented x3. No acute cardiopulmonary distress.  Eyes: Pupils equal, round, reactive to light. Extraocular muscles are intact. Sclerae anicteric and noninjected.  ENT: Moist mucosal membranes. No mucosal lesions.  Neck: Neck supple without lymphadenopathy. No carotid bruits. No masses palpated.  Cardiovascular: Regular rate with normal S1-S2 sounds. No murmurs, rubs, gallops auscultated. No JVD.  Respiratory: Good respiratory effort with no wheezes, rales, rhonchi. Lungs clear to auscultation bilaterally.  Abdomen:  tense abdomen that is exquisitely tender in the left lower quadrant with mild rebound. There is significant guarding.  skin overlying the stomach is a little mottled.  Skin: Dry, warm to touch. 2+ dorsalis pedis and radial pulses. Musculoskeletal: No calf or leg pain. All major joints not erythematous nontender.    Psychiatric: Intact judgment and insight.  Neurologic: No focal neurological deficits. Cranial nerves II through XII are grossly intact.           Labs on Admission:  Basic Metabolic Panel:  Recent Labs Lab 12/26/14 1228 12/27/14 0653 12/28/14 0703 12/30/14 1609  NA 133* 138 139 139  K 3.9 3.7 3.9 4.2  CL 102 101 103 109  CO2 24 28 30 26   GLUCOSE 116* 113* 108* 130*  BUN 8 6 7 11   CREATININE 0.81 0.85 0.83 0.79  CALCIUM 9.0 8.8 8.7 8.4   Liver Function Tests:  Recent Labs Lab 12/26/14 1228  AST 25  ALT 24  ALKPHOS 98  BILITOT 0.6  PROT 6.9  ALBUMIN 3.6   No results for input(s): LIPASE, AMYLASE in the last 168 hours. No results for input(s): AMMONIA in the last 168 hours. CBC:  Recent Labs Lab 12/26/14 0823 12/26/14 1228 12/27/14 0653 12/28/14 0703 12/30/14 1609  WBC 18.3* 18.9* 14.9* 11.2* 16.1*  NEUTROABS 14.6* 14.1*  --   --  13.5*  HGB 16.5 15.4 14.9 14.2 14.8  HCT 49.6 45.2 45.9 44.2 44.9  MCV 100.6* 95.0 96.6 96.9 95.9  PLT 317 308 276 277 295   Cardiac Enzymes: No results for input(s): CKTOTAL, CKMB, CKMBINDEX, TROPONINI in the last 168 hours.  BNP (last 3 results) No results for input(s): BNP in the last 8760 hours.  ProBNP (last 3 results) No results for input(s): PROBNP in the last 8760 hours.  CBG: No results for input(s): GLUCAP in the last 168 hours.  Radiological Exams on Admission: Ct Abdomen Pelvis Wo Contrast  12/30/2014   CLINICAL DATA:  Acute onset of right lower quadrant abdominal pain earlier today. Patient was discharged from the hospital yesterday after treatment for diverticulitis. Surgical history includes hernia repair and hemorrhoidectomy.  EXAM: CT ABDOMEN AND PELVIS WITHOUT CONTRAST  TECHNIQUE: Multidetector CT imaging of the abdomen and pelvis was performed following the standard protocol without IV contrast.  COMPARISON:  12/26/2014, 12/08/2014.  FINDINGS: Lack of intravenous contrast makes the examination less  sensitive than it would be otherwise. Interval slight decrease in size of the abscess adjacent to the distal sigmoid colon in the midline of the pelvis, current measurements approximating 3.1 x 1.1 cm (series 2, image 76), previously 3.9 x 1.8 cm by my measurements. New extraluminal gas bubbles in the mesentery adjacent to the distal sigmoid colon, and interval worsening of the acute inflammatory changes in this region. No acute inflammatory changes elsewhere involving the colon or small bowel. Very small hiatal hernia again noted, with an otherwise normal appearing stomach. Proximal appendix dilated up to approximately 11 mm diameter, with the distal appendix normal in caliber. No associated periappendiceal inflammation or edema.  Subcentimeter simple cyst in the anterior segment right lobe of liver near the dome; allowing for the unenhanced technique, no significant focal hepatic parenchymal abnormality. Normal unenhanced appearance of the spleen, pancreas, and adrenal glands. Small amount of sludge dependently in the otherwise  normal-appearing gallbladder. No biliary ductal dilation. Normal-appearing right kidney. Exophytic cortical cyst arising from the upper pole of the left kidney as noted previously. Mild to moderate aortoiliac atherosclerosis without aneurysm. No significant lymphadenopathy.  Urinary bladder decompressed and unremarkable. Mild median lobe prostate gland enlargement. Normal seminal vesicles. Small right inguinal hernia containing fat.  Bone window images demonstrate lower thoracic spondylosis, severe degenerative disc disease and spondylosis at L5-S1, and moderate degenerative disc disease and spondylosis at L1-2. Visualized lung bases clear. Heart size upper normal.  IMPRESSION: 1. Since the CT 4 days ago, new extraluminal gas bubbles adjacent to the distal sigmoid colon in the area of previously identified diverticulitis, consistent with microperforation, with increasing edema/inflammation  surrounding the distal sigmoid colon. The previously identified abscess has decreased in size. 2. Interval development of dilation of the proximal appendix, measuring up to approximately 11 mm diameter (6 mm previously). While there is no periappendiceal edema or inflammation currently, very early appendicitis may be present, given the change since 4 days ago. 3. Very small hiatal hernia. 4. Small amount of gallbladder sludge. No evidence of acute cholecystitis.   Electronically Signed   By: Evangeline Dakin M.D.   On: 12/30/2014 17:22   Dg Chest Port 1 View  12/30/2014   CLINICAL DATA:  Acute onset of right lower quadrant abdominal pain approximately 4 hr ago. Patient discharged from the hospital 2 days ago after treatment for diverticulitis.  EXAM: PORTABLE CHEST - 1 VIEW  COMPARISON:  08/18/2014, 02/11/2013.  FINDINGS: Cardiac silhouette normal and mediastinal contours unremarkable for the AP portable technique. Pulmonary parenchyma clear. Bronchovascular markings normal. Pulmonary vascularity normal. No pneumothorax. No visible pleural effusions.  IMPRESSION: No acute cardiopulmonary disease.   Electronically Signed   By: Evangeline Dakin M.D.   On: 12/30/2014 16:51    Assessment/Plan Present on Admission:  . Diverticulitis of colon with perforation  #1 diverticulosis of colon with microperforation #2 hypertension #3 generalized anxiety disorder #4 tobacco abuse  Admits Continue Zosyn Surgery was consulted by the ER physician, who recommended continuing with antibiotics and attempts to improve inflammation and infection in order to avoid having to perform a colectomy with a colostomy. Tylenol Dilaudid for pain  clear liquid diet  DVT prophylaxis: TED stockings - will avoid pharmacologic prophylaxis should the patient need emergent surgery  Consultants: General surgery   Code Status: Full code   Family Communication: wife in the room   Disposition Plan: Pending   Time spent: 50  minutes was spent with face-to-face time with patient with at least 50% with counseling and coordination of care  Truett Mainland, DO [facility] Pager [phone]

## 2014-12-30 NOTE — ED Notes (Signed)
Pt refused in and out cath

## 2014-12-30 NOTE — ED Provider Notes (Signed)
CSN: [id number]     Arrival date & time 12/30/14  1602 History   First MD Initiated Contact with Patient 12/30/14 1608     Chief Complaint  Patient presents with  . Abdominal Pain     (Consider location/radiation/quality/duration/timing/severity/associated sxs/prior Treatment) HPI   [name] is a 49 y.o. male who presents for sudden onset of abdominal pain at 1 PM today.  After the pain began.  He felt a sharp pop, and his abdomen, that worsened his pain.He is recovering at home from an episode of diverticulitis, taking antibiotics.  He was hospitalized but did not have surgery.  He was discharged 2 days ago.  He has pain in his low abdomen, and back.  He denies lower extremity weakness or tingling.  He has not been vomiting today.  He had a normal bowel movement yesterday.  He does not think he has had a temperature.  Level V Caveat- severe illness     Past Medical History  Diagnosis Date  . Allergy   . Hypertension   . GAD (generalized anxiety disorder)   . Diverticulitis   . ETOH abuse    Past Surgical History  Procedure Laterality Date  . Rotator cuff repair    . Hernia repair    . Hemorrhoid surgery     Family History  Problem Relation Age of Onset  . Stroke Mother   . CAD Father    History  Substance Use Topics  . Smoking status: Current Every Day Smoker -- 1.00 packs/day    Types: Cigarettes  . Smokeless tobacco: Never Used  . Alcohol Use: 2.4 oz/week    0 Not specified, 4 Cans of beer per week    Review of Systems  Unable to perform ROS     Allergies  Review of patient's allergies indicates no known allergies.  Home Medications   Prior to Admission medications   Medication Sig Start Date End Date Taking? Authorizing Provider  amoxicillin-clavulanate (AUGMENTIN) 875-125 MG per tablet Take 1 tablet by mouth every 12 (twelve) hours. 12/28/14  Yes Lezlie Octave Black, NP  atenolol (TENORMIN) 50 MG tablet TAKE 1 TABLET BY MOUTH EVERY DAY 10/25/14  Yes  Susy Frizzle, MD  clonazePAM (KLONOPIN) 0.5 MG tablet TAKE 1 TABLET BY MOUTH TWICE A DAY AS NEEDED - MUST LAST 30 DAYS 09/28/14  Yes Susy Frizzle, MD  cyclobenzaprine (FLEXERIL) 10 MG tablet TAKE 1 TABLET BY MOUTH AT BEDTIME AS NEEDED 10/29/13  Yes Susy Frizzle, MD  fluticasone [facility]) 50 MCG/ACT nasal spray Place 2 sprays into both nostrils daily. 03/09/14  Yes Susy Frizzle, MD  HYDROcodone-acetaminophen (NORCO/VICODIN) 5-325 MG per tablet Take 1 tablet by mouth every 6 (six) hours as needed for severe pain. 12/28/14  Yes Lezlie Octave Black, NP  simvastatin (ZOCOR) 40 MG tablet TAKE 1 TABLET BY MOUTH EVERY DAY 10/25/14  Yes Susy Frizzle, MD  venlafaxine XR (EFFEXOR-XR) 150 MG 24 hr capsule TAKE 1 CAPSULE (150 MG TOTAL) BY MOUTH EVERY MORNING.   Yes Susy Frizzle, MD  NASAL SALINE NA Place 1 spray into the nose daily as needed. For nose bleeds    Historical Provider, MD   BP 127/78 mmHg  Pulse 90  Temp(Src) 97.5 F (36.4 C) (Oral)  Resp 15  SpO2 100% Physical Exam  Constitutional: He is oriented to person, place, and time. He appears well-developed and well-nourished.  HENT:  Head: Normocephalic and atraumatic.  Right Ear: External ear normal.  Left Ear: External ear normal.  Eyes: Conjunctivae and EOM are normal. Pupils are equal, round, and reactive to light.  Neck: Normal range of motion and phonation normal. Neck supple.  Cardiovascular: Normal rate, regular rhythm and normal heart sounds.   Diminished but palpable pulses in both posterior tibial bilaterally.  Skin of feet are warm and dry.  Pulmonary/Chest: Effort normal and breath sounds normal. No respiratory distress. He has no rales. He exhibits no bony tenderness.  Abdominal: Soft. He exhibits no mass. There is tenderness (exquisite, diffuse). There is rebound and guarding.  Hypoactive bowel sounds  Musculoskeletal: Normal range of motion. He exhibits no edema or tenderness.  Neurological: He is alert and oriented  to person, place, and time. No cranial nerve deficit or sensory deficit. He exhibits normal muscle tone. Coordination normal.  Skin: Skin is warm, dry and intact.  Skin of abdominal wall is mottled.  There is no mottling over the arms or legs.  Psychiatric: He has a normal mood and affect. His behavior is normal. Judgment and thought content normal.  Nursing note and vitals reviewed.   ED Course  Procedures (including critical care time)  Initial evaluation is consistent with perforated abdominal viscus.  Chest x-ray, ordered to look for free air.  No evident free air, on my evaluation of film prior to radiology reading. CT abdomen ordered to evaluate for acute intra-abdominal crises such as ruptured abdominal aorta.  IV fluid boluses ordered.  Medications  0.9 %  sodium chloride infusion ( Intravenous New Bag/Given 12/30/14 1648)  HYDROmorphone (DILAUDID) injection 1 mg (1 mg Intravenous Given 12/30/14 1645)  piperacillin-tazobactam (ZOSYN) IVPB 3.375 g (not administered)  ondansetron (ZOFRAN) injection 4 mg (4 mg Intravenous Given 12/30/14 1620)  HYDROmorphone (DILAUDID) injection 1 mg (1 mg Intravenous Given 12/30/14 1620)  sodium chloride 0.9 % bolus 2,000 mL (0 mLs Intravenous Stopped 12/30/14 1716)  ondansetron (ZOFRAN) injection 4 mg (4 mg Intravenous Given 12/30/14 1743)    Patient Vitals for the past 24 hrs:  BP Temp Temp src Pulse Resp SpO2  12/30/14 1745 127/78 mmHg - - 90 15 100 %  12/30/14 1715 140/93 mmHg - - 91 16 97 %  12/30/14 1700 143/99 mmHg - - - 16 -  12/30/14 1645 - - - - 14 -  12/30/14 1630 (!) 141/101 mmHg - - - - -  12/30/14 1625 - 97.5 F (36.4 C) Oral - - -  12/30/14 1615 (!) 152/104 mmHg - - - 16 -  12/30/14 1607 148/100 mmHg 98 F (36.7 C) Oral 60 16 -    4:50 PM Reevaluation with update and discussion. After initial assessment and treatment, an updated evaluation reveals more comfortable after return from CT, and 2nd dose of Dilaudid. BP is reassuring.  [name]   17:25-CT reading is available, and is consistent with clinical evaluation for perforated viscus, although this is contained within the mesentery.  We will paged general surgery for evaluation.  He appears to need an operative intervention at this time.  18:00- case discussed in depth with Dr. Arnoldo Morale who will review the CT results, when he gets to a computer.  At this time, he believes that the patient needs to be started on Zosyn, admitted medically, kept on ice chips only and he will see the patient in the morning.  Dr. Arnoldo Morale believes that he can improve the patient's status by treating with IV antibiotics, and proceed with partial colectomy, in 3 days.  6:03 PM-Consult complete with Hospitalist.  Patient case explained and discussed. He agrees to admit patient for further evaluation and treatment. Call ended at 1810     Date: 12/30/14- MUSE hyperlink is inactive  Rate: 81  Rhythm: normal sinus rhythm  QRS Axis: normal  PR and QT Intervals: QT prolonged  ST/T Wave abnormalities: nonspecific ST/T changes  PR and QRS Conduction Disutrbances:prolonged QTc  Narrative Interpretation:   Old EKG Reviewed: unchanged   Labs Review Labs Reviewed  BASIC METABOLIC PANEL - Abnormal; Notable for the following:    Glucose, Bld 130 (*)    Anion gap 4 (*)    All other components within normal limits  CBC WITH DIFFERENTIAL/PLATELET - Abnormal; Notable for the following:    WBC 16.1 (*)    Neutrophils Relative % 83 (*)    Neutro Abs 13.5 (*)    Lymphocytes Relative 8 (*)    Monocytes Absolute 1.3 (*)    All other components within normal limits  CULTURE, BLOOD (ROUTINE X 2)  CULTURE, BLOOD (ROUTINE X 2)  URINE CULTURE  URINALYSIS, ROUTINE W REFLEX MICROSCOPIC  ETHANOL  I-STAT CG4 LACTIC ACID, ED  TYPE AND SCREEN    Imaging Review Ct Abdomen Pelvis Wo Contrast  12/30/2014   CLINICAL DATA:  Acute onset of right lower quadrant abdominal pain earlier today. Patient was  discharged from the hospital yesterday after treatment for diverticulitis. Surgical history includes hernia repair and hemorrhoidectomy.  EXAM: CT ABDOMEN AND PELVIS WITHOUT CONTRAST  TECHNIQUE: Multidetector CT imaging of the abdomen and pelvis was performed following the standard protocol without IV contrast.  COMPARISON:  12/26/2014, 12/08/2014.  FINDINGS: Lack of intravenous contrast makes the examination less sensitive than it would be otherwise. Interval slight decrease in size of the abscess adjacent to the distal sigmoid colon in the midline of the pelvis, current measurements approximating 3.1 x 1.1 cm (series 2, image 76), previously 3.9 x 1.8 cm by my measurements. New extraluminal gas bubbles in the mesentery adjacent to the distal sigmoid colon, and interval worsening of the acute inflammatory changes in this region. No acute inflammatory changes elsewhere involving the colon or small bowel. Very small hiatal hernia again noted, with an otherwise normal appearing stomach. Proximal appendix dilated up to approximately 11 mm diameter, with the distal appendix normal in caliber. No associated periappendiceal inflammation or edema.  Subcentimeter simple cyst in the anterior segment right lobe of liver near the dome; allowing for the unenhanced technique, no significant focal hepatic parenchymal abnormality. Normal unenhanced appearance of the spleen, pancreas, and adrenal glands. Small amount of sludge dependently in the otherwise normal-appearing gallbladder. No biliary ductal dilation. Normal-appearing right kidney. Exophytic cortical cyst arising from the upper pole of the left kidney as noted previously. Mild to moderate aortoiliac atherosclerosis without aneurysm. No significant lymphadenopathy.  Urinary bladder decompressed and unremarkable. Mild median lobe prostate gland enlargement. Normal seminal vesicles. Small right inguinal hernia containing fat.  Bone window images demonstrate lower thoracic  spondylosis, severe degenerative disc disease and spondylosis at L5-S1, and moderate degenerative disc disease and spondylosis at L1-2. Visualized lung bases clear. Heart size upper normal.  IMPRESSION: 1. Since the CT 4 days ago, new extraluminal gas bubbles adjacent to the distal sigmoid colon in the area of previously identified diverticulitis, consistent with microperforation, with increasing edema/inflammation surrounding the distal sigmoid colon. The previously identified abscess has decreased in size. 2. Interval development of dilation of the proximal appendix, measuring up to approximately 11 mm diameter (6 mm previously). While there is no  periappendiceal edema or inflammation currently, very early appendicitis may be present, given the change since 4 days ago. 3. Very small hiatal hernia. 4. Small amount of gallbladder sludge. No evidence of acute cholecystitis.   Electronically Signed   By: Evangeline Dakin M.D.   On: 12/30/2014 17:22   Dg Chest Port 1 View  12/30/2014   CLINICAL DATA:  Acute onset of right lower quadrant abdominal pain approximately 4 hr ago. Patient discharged from the hospital 2 days ago after treatment for diverticulitis.  EXAM: PORTABLE CHEST - 1 VIEW  COMPARISON:  08/18/2014, 02/11/2013.  FINDINGS: Cardiac silhouette normal and mediastinal contours unremarkable for the AP portable technique. Pulmonary parenchyma clear. Bronchovascular markings normal. Pulmonary vascularity normal. No pneumothorax. No visible pleural effusions.  IMPRESSION: No acute cardiopulmonary disease.   Electronically Signed   By: Evangeline Dakin M.D.   On: 12/30/2014 16:51     EKG Interpretation None      MDM   Final diagnoses:  Abdominal pain  Sigmoid diverticulitis    Sigmoid diverticulitis with microperforation.  Patient has severe abdominal pain.  No evidence for sepsis, metabolic instability or suspected impending vascular collapse.  He will require admission to a monitored unit, and  evaluation by general surgery.   Nursing Notes Reviewed/ Care Coordinated, and agree without changes. Applicable Imaging Reviewed.  Interpretation of Laboratory Data incorporated into ED treatment  Plan: Admit    Richarda Blade, MD 12/31/14 1148

## 2014-12-31 ENCOUNTER — Encounter (HOSPITAL_COMMUNITY): Payer: Self-pay | Admitting: Internal Medicine

## 2014-12-31 DIAGNOSIS — F411 Generalized anxiety disorder: Secondary | ICD-10-CM

## 2014-12-31 DIAGNOSIS — K389 Disease of appendix, unspecified: Secondary | ICD-10-CM

## 2014-12-31 LAB — CBC
HCT: 45.8 % (ref 39.0–52.0)
Hemoglobin: 14.9 g/dL (ref 13.0–17.0)
MCH: 31.3 pg (ref 26.0–34.0)
MCHC: 32.5 g/dL (ref 30.0–36.0)
MCV: 96.2 fL (ref 78.0–100.0)
Platelets: 270 10*3/uL (ref 150–400)
RBC: 4.76 MIL/uL (ref 4.22–5.81)
RDW: 13.3 % (ref 11.5–15.5)
WBC: 17.5 10*3/uL — ABNORMAL HIGH (ref 4.0–10.5)

## 2014-12-31 LAB — BASIC METABOLIC PANEL
Anion gap: 8 (ref 5–15)
BUN: 9 mg/dL (ref 6–23)
CO2: 26 mmol/L (ref 19–32)
Calcium: 8.4 mg/dL (ref 8.4–10.5)
Chloride: 102 mmol/L (ref 96–112)
Creatinine, Ser: 0.74 mg/dL (ref 0.50–1.35)
GFR calc Af Amer: >90 mL/min (ref >90)
GFR calc non Af Amer: >90 mL/min (ref >90)
Glucose, Bld: 109 mg/dL — ABNORMAL HIGH (ref 70–99)
Potassium: 3.6 mmol/L (ref 3.5–5.1)
SODIUM: 136 mmol/L (ref 135–145)

## 2014-12-31 LAB — PREPARE RBC (CROSSMATCH)

## 2014-12-31 MED ORDER — METOPROLOL TARTRATE 1 MG/ML IV SOLN
5.0000 mg | Freq: Four times a day (QID) | INTRAVENOUS | Status: DC
  Administered 2014-12-31 – 2015-01-04 (×15): 5 mg via INTRAVENOUS
  Filled 2014-12-31 (×15): qty 5

## 2014-12-31 MED ORDER — PEG 3350-KCL-NA BICARB-NACL 420 G PO SOLR
4000.0000 mL | Freq: Once | ORAL | Status: AC
  Administered 2014-12-31: 4000 mL via ORAL
  Filled 2014-12-31: qty 4000

## 2014-12-31 MED ORDER — ONDANSETRON HCL 4 MG/2ML IJ SOLN
4.0000 mg | Freq: Four times a day (QID) | INTRAMUSCULAR | Status: DC
  Administered 2014-12-31 – 2015-01-05 (×18): 4 mg via INTRAVENOUS
  Filled 2014-12-31 (×18): qty 2

## 2014-12-31 MED ORDER — LORAZEPAM 2 MG/ML IJ SOLN: 0.5000 mg | INTRAMUSCULAR | Status: DC | PRN

## 2014-12-31 MED ORDER — KCL IN DEXTROSE-NACL 40-5-0.45 MEQ/L-%-% IV SOLN
INTRAVENOUS | Status: DC
  Administered 2014-12-31 – 2015-01-01 (×2): via INTRAVENOUS

## 2014-12-31 MED ORDER — KCL IN DEXTROSE-NACL 20-5-0.9 MEQ/L-%-% IV SOLN: INTRAVENOUS | Status: DC

## 2014-12-31 MED ORDER — CYCLOBENZAPRINE HCL 10 MG PO TABS
10.0000 mg | ORAL_TABLET | Freq: Three times a day (TID) | ORAL | Status: DC | PRN
  Administered 2014-12-31 – 2015-01-04 (×7): 10 mg via ORAL
  Filled 2014-12-31 (×7): qty 1

## 2014-12-31 MED ORDER — PANTOPRAZOLE SODIUM 40 MG PO TBEC
40.0000 mg | DELAYED_RELEASE_TABLET | Freq: Every day | ORAL | Status: DC
  Administered 2014-12-31 – 2015-01-03 (×4): 40 mg via ORAL
  Filled 2014-12-31 (×4): qty 1

## 2014-12-31 MED ORDER — CHLORHEXIDINE GLUCONATE CLOTH 2 % EX PADS
6.0000 | MEDICATED_PAD | Freq: Once | CUTANEOUS | Status: AC
  Administered 2014-12-31: 6 via TOPICAL

## 2014-12-31 MED ORDER — THIAMINE HCL 100 MG/ML IJ SOLN
100.0000 mg | Freq: Every day | INTRAMUSCULAR | Status: AC
  Administered 2014-12-31 – 2015-01-02 (×3): 100 mg via INTRAVENOUS
  Filled 2014-12-31 (×3): qty 2

## 2014-12-31 MED ORDER — SODIUM CHLORIDE 0.9 % IV SOLN
Freq: Once | INTRAVENOUS | Status: DC
Start: 2014-12-31 — End: 2015-01-01

## 2014-12-31 MED ORDER — ERYTHROMYCIN BASE 250 MG PO TABS
1000.0000 mg | ORAL_TABLET | ORAL | Status: AC
  Administered 2014-12-31 – 2015-01-01 (×3): 1000 mg via ORAL
  Filled 2014-12-31 (×2): qty 4

## 2014-12-31 MED ORDER — HYDROMORPHONE HCL 1 MG/ML IJ SOLN
1.0000 mg | Freq: Once | INTRAMUSCULAR | Status: AC
  Administered 2014-12-31: 1 mg via INTRAVENOUS
  Filled 2014-12-31: qty 1

## 2014-12-31 MED ORDER — NEOMYCIN SULFATE 500 MG PO TABS
1000.0000 mg | ORAL_TABLET | ORAL | Status: AC
Start: 2014-12-31 — End: 2015-01-01
  Administered 2014-12-31 – 2015-01-01 (×3): 1000 mg via ORAL
  Filled 2014-12-31 (×3): qty 2

## 2014-12-31 MED ORDER — METOPROLOL TARTRATE 1 MG/ML IV SOLN: 5.0000 mg | INTRAVENOUS | Status: DC

## 2014-12-31 MED ORDER — ENOXAPARIN SODIUM 40 MG/0.4ML ~~LOC~~ SOLN
40.0000 mg | Freq: Every day | SUBCUTANEOUS | Status: DC
  Administered 2014-12-31 – 2015-01-01 (×2): 40 mg via SUBCUTANEOUS
  Filled 2014-12-31 (×2): qty 0.4

## 2014-12-31 MED ORDER — CHLORHEXIDINE GLUCONATE CLOTH 2 % EX PADS
6.0000 | MEDICATED_PAD | Freq: Once | CUTANEOUS | Status: AC
  Administered 2015-01-01: 6 via TOPICAL

## 2014-12-31 MED ORDER — FAMOTIDINE IN NACL 20-0.9 MG/50ML-% IV SOLN
20.0000 mg | Freq: Every day | INTRAVENOUS | Status: DC
  Filled 2014-12-31 (×2): qty 50

## 2014-12-31 MED ORDER — ALVIMOPAN 12 MG PO CAPS
12.0000 mg | ORAL_CAPSULE | Freq: Once | ORAL | Status: AC
  Administered 2014-12-31: 12 mg via ORAL
  Filled 2014-12-31: qty 1

## 2014-12-31 NOTE — Consult Note (Signed)
Subjective: Patient well known to me from previous admission. He has failed outpatient conservative management.  Objective: Vital signs in last 24 hours: Temp:  [97.5 F (36.4 C)-99.6 F (37.6 C)] 98.6 F (37 C) (02/07 0626) Pulse Rate:  [60-95] 90 (02/07 0626) Resp:  [14-30] 21 (02/07 0626) BP: (120-152)/(66-104) 123/83 mmHg (02/07 0626) SpO2:  [92 %-100 %] 92 % (02/07 0626) Weight:  [87.1 kg (192 lb 0.3 oz)] 87.1 kg (192 lb 0.3 oz) (02/06 2100) Last BM Date: 12/30/14  Intake/Output from previous day: 02/06 0701 - 02/07 0700 In: -  Out: 300 [Urine:300] Intake/Output this shift:    General appearance: alert, cooperative and no distress Resp: clear to auscultation bilaterally Cardio: regular rate and rhythm, S1, S2 normal, no murmur, click, rub or gallop GI: Soft with tenderness noted in the suprapubic and left lower quadrant region. No rigidity noted.  Lab Results:   Recent Labs  12/30/14 1609 12/31/14 0636  WBC 16.1* 17.5*  HGB 14.8 14.9  HCT 44.9 45.8  PLT 295 270   BMET  Recent Labs  12/30/14 1609 12/31/14 0636  NA 139 136  K 4.2 3.6  CL 109 102  CO2 26 26  GLUCOSE 130* 109*  BUN 11 9  CREATININE 0.79 0.74  CALCIUM 8.4 8.4   PT/INR No results for input(s): LABPROT, INR in the last 72 hours.  Studies/Results: Ct Abdomen Pelvis Wo Contrast  12/30/2014   CLINICAL DATA:  Acute onset of right lower quadrant abdominal pain earlier today. Patient was discharged from the hospital yesterday after treatment for diverticulitis. Surgical history includes hernia repair and hemorrhoidectomy.  EXAM: CT ABDOMEN AND PELVIS WITHOUT CONTRAST  TECHNIQUE: Multidetector CT imaging of the abdomen and pelvis was performed following the standard protocol without IV contrast.  COMPARISON:  12/26/2014, 12/08/2014.  FINDINGS: Lack of intravenous contrast makes the examination less sensitive than it would be otherwise. Interval slight decrease in size of the abscess adjacent to  the distal sigmoid colon in the midline of the pelvis, current measurements approximating 3.1 x 1.1 cm (series 2, image 76), previously 3.9 x 1.8 cm by my measurements. New extraluminal gas bubbles in the mesentery adjacent to the distal sigmoid colon, and interval worsening of the acute inflammatory changes in this region. No acute inflammatory changes elsewhere involving the colon or small bowel. Very small hiatal hernia again noted, with an otherwise normal appearing stomach. Proximal appendix dilated up to approximately 11 mm diameter, with the distal appendix normal in caliber. No associated periappendiceal inflammation or edema.  Subcentimeter simple cyst in the anterior segment right lobe of liver near the dome; allowing for the unenhanced technique, no significant focal hepatic parenchymal abnormality. Normal unenhanced appearance of the spleen, pancreas, and adrenal glands. Small amount of sludge dependently in the otherwise normal-appearing gallbladder. No biliary ductal dilation. Normal-appearing right kidney. Exophytic cortical cyst arising from the upper pole of the left kidney as noted previously. Mild to moderate aortoiliac atherosclerosis without aneurysm. No significant lymphadenopathy.  Urinary bladder decompressed and unremarkable. Mild median lobe prostate gland enlargement. Normal seminal vesicles. Small right inguinal hernia containing fat.  Bone window images demonstrate lower thoracic spondylosis, severe degenerative disc disease and spondylosis at L5-S1, and moderate degenerative disc disease and spondylosis at L1-2. Visualized lung bases clear. Heart size upper normal.  IMPRESSION: 1. Since the CT 4 days ago, new extraluminal gas bubbles adjacent to the distal sigmoid colon in the area of previously identified diverticulitis, consistent with microperforation, with increasing edema/inflammation surrounding the  distal sigmoid colon. The previously identified abscess has decreased in size. 2.  Interval development of dilation of the proximal appendix, measuring up to approximately 11 mm diameter (6 mm previously). While there is no periappendiceal edema or inflammation currently, very early appendicitis may be present, given the change since 4 days ago. 3. Very small hiatal hernia. 4. Small amount of gallbladder sludge. No evidence of acute cholecystitis.   Electronically Signed   By: Evangeline Dakin M.D.   On: 12/30/2014 17:22   Dg Chest Port 1 View  12/30/2014   CLINICAL DATA:  Acute onset of right lower quadrant abdominal pain approximately 4 hr ago. Patient discharged from the hospital 2 days ago after treatment for diverticulitis.  EXAM: PORTABLE CHEST - 1 VIEW  COMPARISON:  08/18/2014, 02/11/2013.  FINDINGS: Cardiac silhouette normal and mediastinal contours unremarkable for the AP portable technique. Pulmonary parenchyma clear. Bronchovascular markings normal. Pulmonary vascularity normal. No pneumothorax. No visible pleural effusions.  IMPRESSION: No acute cardiopulmonary disease.   Electronically Signed   By: Evangeline Dakin M.D.   On: 12/30/2014 16:51    Anti-infectives: Anti-infectives    Start     Dose/Rate Route Frequency Ordered Stop   12/31/14 0200  piperacillin-tazobactam (ZOSYN) IVPB 3.375 g     3.375 g12.5 mL/hr over 240 Minutes Intravenous Every 8 hours 12/30/14 2130     12/30/14 1815  piperacillin-tazobactam (ZOSYN) IVPB 3.375 g     3.375 g100 mL/hr over 30 Minutes Intravenous  Once 12/30/14 1800 12/30/14 1925      Assessment/Plan: Impression: Intractable sigmoid diverticulitis, distention of appendix Plan: Will proceed with partial colectomy, incidental appendectomy tomorrow. The risks and benefits of the procedure including bleeding, infection, cardiopulmonary difficulties, anastomotic leak, and the possibility of a colostomy were fully explained to the patient, who gave informed consent.  LOS: 1 day    Cayden Granholm A 12/31/2014

## 2014-12-31 NOTE — Progress Notes (Signed)
[facility] PROGRESS NOTE  HASTEN SWEITZER DVV:[id number] DOB: May 18, 1966 DOA: 12/30/2014 PCP: Odette Fraction, MD    Code Status: Full code Family Communication: Discussed with wife Disposition Plan: Discharge to home and clinically appropriate   Consultants:  Gen. surgery, pending  Procedures:  None  Antibiotics:  Zosyn 2/6>>  HPI/Subjective: The patient is complaining of 8/10 diffuse abdominal pain, mostly located across the lower abdomen. He has nausea, but no vomiting. He had a bowel movement yesterday.  Objective: Filed Vitals:   12/31/14 0626  BP: 123/83  Pulse: 90  Temp: 98.6 F (37 C)  Resp: 21   Oxygen saturation 92% on room air.  Intake/Output Summary (Last 24 hours) at 12/31/14 6269 Last data filed at 12/31/14 4854  Gross per 24 hour  Intake      0 ml  Output    300 ml  Net   -300 ml   Filed Weights   12/30/14 2100  Weight: 87.1 kg (192 lb 0.3 oz)    Exam:   General:  49 year old man laying in bed; he appears ill and on comfortable.  Cardiovascular: S1, S2, no murmurs rubs or gallops.  Respiratory: Decreased breath sounds in the bases and clear otherwise.  Abdomen: Hypoactive bowel sounds, mildly to moderately distended, moderately diffusely tender.  Musculoskeletal/extremities: No pedal edema. No acute hot red joints.  Neurologic: He is alert and oriented 3.   Data Reviewed: Basic Metabolic Panel:  Recent Labs Lab 12/26/14 1228 12/27/14 0653 12/28/14 0703 12/30/14 1609 12/31/14 0636  NA 133* 138 139 139 136  K 3.9 3.7 3.9 4.2 3.6  CL 102 101 103 109 102  CO2 24 28 30 26 26   GLUCOSE 116* 113* 108* 130* 109*  BUN 8 6 7 11 9   CREATININE 0.81 0.85 0.83 0.79 0.74  CALCIUM 9.0 8.8 8.7 8.4 8.4   Liver Function Tests:  Recent Labs Lab 12/26/14 1228  AST 25  ALT 24  ALKPHOS 98  BILITOT 0.6  PROT 6.9  ALBUMIN 3.6   No results for input(s): LIPASE, AMYLASE in the last 168 hours. No results for input(s):  AMMONIA in the last 168 hours. CBC:  Recent Labs Lab 12/26/14 0823 12/26/14 1228 12/27/14 0653 12/28/14 0703 12/30/14 1609 12/31/14 0636  WBC 18.3* 18.9* 14.9* 11.2* 16.1* 17.5*  NEUTROABS 14.6* 14.1*  --   --  13.5*  --   HGB 16.5 15.4 14.9 14.2 14.8 14.9  HCT 49.6 45.2 45.9 44.2 44.9 45.8  MCV 100.6* 95.0 96.6 96.9 95.9 96.2  PLT 317 308 276 277 295 270   Cardiac Enzymes: No results for input(s): CKTOTAL, CKMB, CKMBINDEX, TROPONINI in the last 168 hours. BNP (last 3 results) No results for input(s): BNP in the last 8760 hours.  ProBNP (last 3 results) No results for input(s): PROBNP in the last 8760 hours.  CBG: No results for input(s): GLUCAP in the last 168 hours.  Recent Results (from the past 240 hour(s))  Culture, blood (routine x 2)     Status: None (Preliminary result)   Collection Time: 12/30/14  4:53 PM  Result Value Ref Range Status   Specimen Description BLOOD LEFT HAND  Final   Special Requests BOTTLES DRAWN AEROBIC ONLY 6CC  Final   Culture NO GROWTH 1 DAY  Final   Report Status PENDING  Incomplete  Culture, blood (routine x 2)     Status: None (Preliminary result)   Collection Time: 12/30/14  4:53 PM  Result Value Ref Range Status  Specimen Description BLOOD LEFT HAND  Final   Special Requests BOTTLES DRAWN AEROBIC ONLY 6CC  Final   Culture NO GROWTH 1 DAY  Final   Report Status PENDING  Incomplete     Studies: Ct Abdomen Pelvis Wo Contrast  12/30/2014   CLINICAL DATA:  Acute onset of right lower quadrant abdominal pain earlier today. Patient was discharged from the hospital yesterday after treatment for diverticulitis. Surgical history includes hernia repair and hemorrhoidectomy.  EXAM: CT ABDOMEN AND PELVIS WITHOUT CONTRAST  TECHNIQUE: Multidetector CT imaging of the abdomen and pelvis was performed following the standard protocol without IV contrast.  COMPARISON:  12/26/2014, 12/08/2014.  FINDINGS: Lack of intravenous contrast makes the examination  less sensitive than it would be otherwise. Interval slight decrease in size of the abscess adjacent to the distal sigmoid colon in the midline of the pelvis, current measurements approximating 3.1 x 1.1 cm (series 2, image 76), previously 3.9 x 1.8 cm by my measurements. New extraluminal gas bubbles in the mesentery adjacent to the distal sigmoid colon, and interval worsening of the acute inflammatory changes in this region. No acute inflammatory changes elsewhere involving the colon or small bowel. Very small hiatal hernia again noted, with an otherwise normal appearing stomach. Proximal appendix dilated up to approximately 11 mm diameter, with the distal appendix normal in caliber. No associated periappendiceal inflammation or edema.  Subcentimeter simple cyst in the anterior segment right lobe of liver near the dome; allowing for the unenhanced technique, no significant focal hepatic parenchymal abnormality. Normal unenhanced appearance of the spleen, pancreas, and adrenal glands. Small amount of sludge dependently in the otherwise normal-appearing gallbladder. No biliary ductal dilation. Normal-appearing right kidney. Exophytic cortical cyst arising from the upper pole of the left kidney as noted previously. Mild to moderate aortoiliac atherosclerosis without aneurysm. No significant lymphadenopathy.  Urinary bladder decompressed and unremarkable. Mild median lobe prostate gland enlargement. Normal seminal vesicles. Small right inguinal hernia containing fat.  Bone window images demonstrate lower thoracic spondylosis, severe degenerative disc disease and spondylosis at L5-S1, and moderate degenerative disc disease and spondylosis at L1-2. Visualized lung bases clear. Heart size upper normal.  IMPRESSION: 1. Since the CT 4 days ago, new extraluminal gas bubbles adjacent to the distal sigmoid colon in the area of previously identified diverticulitis, consistent with microperforation, with increasing  edema/inflammation surrounding the distal sigmoid colon. The previously identified abscess has decreased in size. 2. Interval development of dilation of the proximal appendix, measuring up to approximately 11 mm diameter (6 mm previously). While there is no periappendiceal edema or inflammation currently, very early appendicitis may be present, given the change since 4 days ago. 3. Very small hiatal hernia. 4. Small amount of gallbladder sludge. No evidence of acute cholecystitis.   Electronically Signed   By: Evangeline Dakin M.D.   On: 12/30/2014 17:22   Dg Chest Port 1 View  12/30/2014   CLINICAL DATA:  Acute onset of right lower quadrant abdominal pain approximately 4 hr ago. Patient discharged from the hospital 2 days ago after treatment for diverticulitis.  EXAM: PORTABLE CHEST - 1 VIEW  COMPARISON:  08/18/2014, 02/11/2013.  FINDINGS: Cardiac silhouette normal and mediastinal contours unremarkable for the AP portable technique. Pulmonary parenchyma clear. Bronchovascular markings normal. Pulmonary vascularity normal. No pneumothorax. No visible pleural effusions.  IMPRESSION: No acute cardiopulmonary disease.   Electronically Signed   By: Evangeline Dakin M.D.   On: 12/30/2014 16:51    Scheduled Meds: . atenolol  50 mg Oral Daily  .  fluticasone  2 spray Each Nare Daily  . nicotine  14 mg Transdermal Daily  . piperacillin-tazobactam (ZOSYN)  IV  3.375 g Intravenous Q8H  . simvastatin  40 mg Oral Daily  . venlafaxine XR  150 mg Oral Q breakfast   Continuous Infusions: . sodium chloride 75 mL/hr at 12/31/14 0122    1. Progressive acute diverticulitis of the sigmoid colon with perforation and colonic abscess. The patient was recently discharged on 12/28/14 for treatment of diverticulitis with microperforation and colonic abscess. He had improved and was discharged on Augmentin. He returned with worsening abdominal pain and distention. Follow-up CT of his abdomen and pelvis on admission revealed  new extraluminal gas bubbles; increasing edema/inflammation surrounding the distal sigmoid colon; interval development of dilation of the proximal appendix. General surgeon, Dr. Arnoldo Morale has been notified. He recommended continuing antibiotics in an attempt to improve inflammation and infection in order to avoid having to perform a colectomy with a colostomy. -We'll make virtually nothing by mouth and discontinue clear liquids and oral medications and allow only sips of clear liquids. -Continue IV fluid hydration, Zosyn, and analgesics as needed. -We'll add dextrose and potassium chloride to the IV fluids. -We'll add Pepcid IV empirically. -We'll schedule Zofran IV every 6 hours.  Dilatation of the appendix, per CT scan. Not sure about the significance of this finding. Will defer to Dr. Arnoldo Morale.  Essential hypertension. Blood pressure currently controlled. Will discontinue oral metoprolol and start scheduled IV metoprolol.  Gen. anxiety disorder. We'll hold oral Effexor and start when necessary lorazepam IV.  Tobacco abuse. We'll continue nicotine replacement therapy. Patient was advised to stop smoking.  Alcohol abuse. No sign of alcohol withdrawal syndrome. There were no signs during the previous hospitalization. We'll add IV thiamine.  Hyperglycemia. Likely stress induced. His hemoglobin A1c January 2016 was 5.8. I expect his venous glucose to increase with the addition of the dextrose added to the IV fluids. We'll monitor for the need for sliding scale NovoLog.   Principal Problem:   Diverticulitis of colon with perforation Active Problems:   Colonic diverticular abscess   Disorder of appendix   Essential hypertension   Tobacco abuse   GAD (generalized anxiety disorder)   Hyperglycemia   ETOH abuse    Time spent: 35 minutes.    [facility] Pager [phone]. If 7PM-7AM, please contact night-coverage at [email or web address], password [facility] 12/31/2014,  9:28 AM  LOS: 1 day

## 2014-12-31 NOTE — Progress Notes (Signed)
Pt called nurse in room to check IV. IV found to be leaking onto the bed sheets. IV removed from right antecubital; fluids placed into LAC IV. Pt complains that he doesn't think he received the last Dilaudid dose. When administering dose, IV seemed to look fine, without complications, unless unseen. Dr Caryn Section made aware about pt requesting additional Dilaudid dose now. Awaiting to hear from dr now. Will make day nurse aware also. Continuing to monitor. Bed is in lowest position and call bell is within reach. Wife at bedside.

## 2015-01-01 ENCOUNTER — Inpatient Hospital Stay (HOSPITAL_COMMUNITY): Payer: BLUE CROSS/BLUE SHIELD | Admitting: Anesthesiology

## 2015-01-01 ENCOUNTER — Encounter (HOSPITAL_COMMUNITY)
Admission: EM | Disposition: A | Discharge status: Home / Self Care | Payer: BLUE CROSS/BLUE SHIELD | Source: Home / Self Care | Attending: General Surgery

## 2015-01-01 ENCOUNTER — Encounter (HOSPITAL_COMMUNITY): Payer: Self-pay | Admitting: *Deleted

## 2015-01-01 HISTORY — PX: APPENDECTOMY: SHX54

## 2015-01-01 HISTORY — PX: PARTIAL COLECTOMY: SHX5273

## 2015-01-01 LAB — CBC
HCT: 43.3 % (ref 39.0–52.0)
Hemoglobin: 14.1 g/dL (ref 13.0–17.0)
MCH: 31.3 pg (ref 26.0–34.0)
MCHC: 32.6 g/dL (ref 30.0–36.0)
MCV: 96 fL (ref 78.0–100.0)
Platelets: 278 10*3/uL (ref 150–400)
RBC: 4.51 MIL/uL (ref 4.22–5.81)
RDW: 13.5 % (ref 11.5–15.5)
WBC: 19.1 10*3/uL — AB (ref 4.0–10.5)

## 2015-01-01 LAB — COMPREHENSIVE METABOLIC PANEL
ALBUMIN: 2.7 g/dL — AB (ref 3.5–5.2)
ALK PHOS: 67 U/L (ref 39–117)
ALT: 38 U/L (ref 0–53)
ANION GAP: 7 (ref 5–15)
AST: 21 U/L (ref 0–37)
BUN: 6 mg/dL (ref 6–23)
CO2: 26 mmol/L (ref 19–32)
CREATININE: 0.82 mg/dL (ref 0.50–1.35)
Calcium: 8.5 mg/dL (ref 8.4–10.5)
Chloride: 101 mmol/L (ref 96–112)
Glucose, Bld: 137 mg/dL — ABNORMAL HIGH (ref 70–99)
Potassium: 3.4 mmol/L — ABNORMAL LOW (ref 3.5–5.1)
Sodium: 134 mmol/L — ABNORMAL LOW (ref 135–145)
TOTAL PROTEIN: 6.1 g/dL (ref 6.0–8.3)
Total Bilirubin: 0.6 mg/dL (ref 0.3–1.2)

## 2015-01-01 LAB — SURGICAL PCR SCREEN
MRSA, PCR: NEGATIVE
STAPHYLOCOCCUS AUREUS: NEGATIVE

## 2015-01-01 SURGERY — COLECTOMY, PARTIAL
Anesthesia: General

## 2015-01-01 MED ORDER — LACTATED RINGERS IV SOLN
INTRAVENOUS | Status: DC
  Administered 2015-01-01 – 2015-01-02 (×2): via INTRAVENOUS

## 2015-01-01 MED ORDER — NEOSTIGMINE METHYLSULFATE 10 MG/10ML IV SOLN
INTRAVENOUS | Status: DC | PRN
Start: 2015-01-01 — End: 2015-01-01
  Administered 2015-01-01: 3 mg via INTRAVENOUS

## 2015-01-01 MED ORDER — ONDANSETRON HCL 4 MG/2ML IJ SOLN: 4.0000 mg | Freq: Once | INTRAMUSCULAR | Status: DC | PRN

## 2015-01-01 MED ORDER — LACTATED RINGERS IV SOLN
INTRAVENOUS | Status: DC
  Administered 2015-01-01: 1000 mL via INTRAVENOUS

## 2015-01-01 MED ORDER — GLYCOPYRROLATE 0.2 MG/ML IJ SOLN
INTRAMUSCULAR | Status: AC
  Filled 2015-01-01: qty 1

## 2015-01-01 MED ORDER — SODIUM CHLORIDE 0.9 % IJ SOLN
INTRAMUSCULAR | Status: AC
  Filled 2015-01-01: qty 30

## 2015-01-01 MED ORDER — ROCURONIUM BROMIDE 50 MG/5ML IV SOLN
INTRAVENOUS | Status: AC
  Filled 2015-01-01: qty 1

## 2015-01-01 MED ORDER — MIDAZOLAM HCL 5 MG/5ML IJ SOLN
INTRAMUSCULAR | Status: DC | PRN
  Administered 2015-01-01: 2 mg via INTRAVENOUS

## 2015-01-01 MED ORDER — LIDOCAINE HCL (PF) 1 % IJ SOLN
INTRAMUSCULAR | Status: AC
  Filled 2015-01-01: qty 25

## 2015-01-01 MED ORDER — ALVIMOPAN 12 MG PO CAPS
12.0000 mg | ORAL_CAPSULE | Freq: Two times a day (BID) | ORAL | Status: DC
  Administered 2015-01-02 – 2015-01-03 (×3): 12 mg via ORAL
  Filled 2015-01-01 (×3): qty 1

## 2015-01-01 MED ORDER — HYDROMORPHONE HCL 1 MG/ML IJ SOLN
1.0000 mg | INTRAMUSCULAR | Status: DC | PRN
  Administered 2015-01-01 – 2015-01-02 (×5): 1 mg via INTRAVENOUS
  Filled 2015-01-01 (×5): qty 1

## 2015-01-01 MED ORDER — SUFENTANIL CITRATE 50 MCG/ML IV SOLN
INTRAVENOUS | Status: AC
  Filled 2015-01-01: qty 1

## 2015-01-01 MED ORDER — LIDOCAINE HCL (CARDIAC) 20 MG/ML IV SOLN
INTRAVENOUS | Status: DC | PRN
  Administered 2015-01-01: 50 mg via INTRAVENOUS

## 2015-01-01 MED ORDER — SUCCINYLCHOLINE CHLORIDE 20 MG/ML IJ SOLN
INTRAMUSCULAR | Status: DC | PRN
  Administered 2015-01-01: 120 mg via INTRAVENOUS

## 2015-01-01 MED ORDER — SUCCINYLCHOLINE CHLORIDE 20 MG/ML IJ SOLN
INTRAMUSCULAR | Status: AC
  Filled 2015-01-01: qty 1

## 2015-01-01 MED ORDER — FENTANYL CITRATE 0.05 MG/ML IJ SOLN
25.0000 ug | INTRAMUSCULAR | Status: DC
  Administered 2015-01-01 (×2): 25 ug via INTRAVENOUS

## 2015-01-01 MED ORDER — SODIUM CHLORIDE 0.9 % IR SOLN
Status: DC | PRN
  Administered 2015-01-01: 2000 mL

## 2015-01-01 MED ORDER — FENTANYL CITRATE 0.05 MG/ML IJ SOLN
INTRAMUSCULAR | Status: AC
Start: 2015-01-01 — End: 2015-01-01
  Filled 2015-01-01: qty 2

## 2015-01-01 MED ORDER — BUPIVACAINE LIPOSOME 1.3 % IJ SUSP
INTRAMUSCULAR | Status: AC
  Filled 2015-01-01: qty 20

## 2015-01-01 MED ORDER — KETOROLAC TROMETHAMINE 30 MG/ML IJ SOLN
INTRAMUSCULAR | Status: AC
  Filled 2015-01-01: qty 1

## 2015-01-01 MED ORDER — ALVIMOPAN 12 MG PO CAPS
ORAL_CAPSULE | ORAL | Status: AC
  Filled 2015-01-01: qty 1

## 2015-01-01 MED ORDER — LORAZEPAM 2 MG/ML IJ SOLN: 2.0000 mg | INTRAMUSCULAR | Status: DC | PRN

## 2015-01-01 MED ORDER — POVIDONE-IODINE 10 % EX OINT
TOPICAL_OINTMENT | CUTANEOUS | Status: AC
Start: 2015-01-01 — End: 2015-01-01
  Filled 2015-01-01: qty 1

## 2015-01-01 MED ORDER — LACTATED RINGERS IV SOLN
INTRAVENOUS | Status: DC | PRN
  Administered 2015-01-01 (×4): via INTRAVENOUS

## 2015-01-01 MED ORDER — MIDAZOLAM HCL 2 MG/2ML IJ SOLN
1.0000 mg | INTRAMUSCULAR | Status: DC | PRN
  Administered 2015-01-01: 2 mg via INTRAVENOUS

## 2015-01-01 MED ORDER — BUPIVACAINE LIPOSOME 1.3 % IJ SUSP
INTRAMUSCULAR | Status: DC | PRN
  Administered 2015-01-01: 20 mL

## 2015-01-01 MED ORDER — PROPOFOL 10 MG/ML IV BOLUS
INTRAVENOUS | Status: AC
  Filled 2015-01-01: qty 20

## 2015-01-01 MED ORDER — HYDROMORPHONE HCL 1 MG/ML IJ SOLN
0.2500 mg | INTRAMUSCULAR | Status: AC | PRN
  Administered 2015-01-01 (×8): 0.5 mg via INTRAVENOUS
  Filled 2015-01-01 (×2): qty 1

## 2015-01-01 MED ORDER — POVIDONE-IODINE 10 % OINT PACKET
TOPICAL_OINTMENT | CUTANEOUS | Status: DC | PRN
  Administered 2015-01-01: 1 via TOPICAL

## 2015-01-01 MED ORDER — MIDAZOLAM HCL 2 MG/2ML IJ SOLN
INTRAMUSCULAR | Status: AC
  Filled 2015-01-01: qty 2

## 2015-01-01 MED ORDER — PROPOFOL 10 MG/ML IV BOLUS
INTRAVENOUS | Status: DC | PRN
Start: 2015-01-01 — End: 2015-01-01
  Administered 2015-01-01: 180 mg via INTRAVENOUS

## 2015-01-01 MED ORDER — GLYCOPYRROLATE 0.2 MG/ML IJ SOLN
INTRAMUSCULAR | Status: DC | PRN
  Administered 2015-01-01: 0.2 mg via INTRAVENOUS

## 2015-01-01 MED ORDER — ALVIMOPAN 12 MG PO CAPS
12.0000 mg | ORAL_CAPSULE | Freq: Once | ORAL | Status: AC
  Administered 2015-01-01: 12 mg via ORAL

## 2015-01-01 MED ORDER — KETOROLAC TROMETHAMINE 30 MG/ML IJ SOLN
30.0000 mg | Freq: Once | INTRAMUSCULAR | Status: AC
Start: 2015-01-01 — End: 2015-01-01
  Administered 2015-01-01: 30 mg via INTRAVENOUS

## 2015-01-01 MED ORDER — NEOSTIGMINE METHYLSULFATE 10 MG/10ML IV SOLN
INTRAVENOUS | Status: AC
  Filled 2015-01-01: qty 1

## 2015-01-01 MED ORDER — SUFENTANIL CITRATE 50 MCG/ML IV SOLN
INTRAVENOUS | Status: DC | PRN
  Administered 2015-01-01: 15 ug via INTRAVENOUS
  Administered 2015-01-01: 5 ug via INTRAVENOUS
  Administered 2015-01-01 (×4): 10 ug via INTRAVENOUS
  Administered 2015-01-01: 5 ug via INTRAVENOUS
  Administered 2015-01-01 (×2): 10 ug via INTRAVENOUS
  Administered 2015-01-01: 5 ug via INTRAVENOUS
  Administered 2015-01-01: 10 ug via INTRAVENOUS

## 2015-01-01 MED ORDER — ROCURONIUM BROMIDE 100 MG/10ML IV SOLN
INTRAVENOUS | Status: DC | PRN
  Administered 2015-01-01: 10 mg via INTRAVENOUS
  Administered 2015-01-01: 20 mg via INTRAVENOUS
  Administered 2015-01-01 (×4): 10 mg via INTRAVENOUS

## 2015-01-01 MED ORDER — ONDANSETRON HCL 4 MG/2ML IJ SOLN
INTRAMUSCULAR | Status: AC
  Filled 2015-01-01: qty 2

## 2015-01-01 MED ORDER — ONDANSETRON HCL 4 MG/2ML IJ SOLN
4.0000 mg | Freq: Once | INTRAMUSCULAR | Status: AC
  Administered 2015-01-01: 4 mg via INTRAVENOUS

## 2015-01-01 SURGICAL SUPPLY — 49 items
BAG HAMPER (MISCELLANEOUS) ×3 IMPLANT
CHLORAPREP W/TINT 26ML (MISCELLANEOUS) ×3 IMPLANT
CLOTH BEACON ORANGE TIMEOUT ST (SAFETY) ×3 IMPLANT
COVER LIGHT HANDLE STERIS (MISCELLANEOUS) ×6 IMPLANT
COVER MAYO STAND XLG (DRAPE) ×3 IMPLANT
DRAPE UTILITY W/TAPE 26X15 (DRAPES) ×6 IMPLANT
DRAPE WARM FLUID 44X44 (DRAPE) ×3 IMPLANT
DRSG OPSITE POSTOP 4X10 (GAUZE/BANDAGES/DRESSINGS) ×3 IMPLANT
ELECT REM PT RETURN 9FT ADLT (ELECTROSURGICAL) ×3
ELECTRODE REM PT RTRN 9FT ADLT (ELECTROSURGICAL) ×1 IMPLANT
GLOVE BIOGEL M 7.0 STRL (GLOVE) ×6 IMPLANT
GLOVE BIOGEL PI IND STRL 7.0 (GLOVE) ×4 IMPLANT
GLOVE BIOGEL PI INDICATOR 7.0 (GLOVE) ×8
GLOVE ECLIPSE 6.5 STRL STRAW (GLOVE) ×6 IMPLANT
GLOVE SURG SS PI 7.5 STRL IVOR (GLOVE) ×6 IMPLANT
GOWN STRL REUS W/ TWL XL LVL3 (GOWN DISPOSABLE) ×2 IMPLANT
GOWN STRL REUS W/TWL LRG LVL3 (GOWN DISPOSABLE) ×12 IMPLANT
GOWN STRL REUS W/TWL XL LVL3 (GOWN DISPOSABLE) ×4
INST SET MAJOR GENERAL (KITS) ×3 IMPLANT
KIT BLADEGUARD II DBL (SET/KITS/TRAYS/PACK) ×3 IMPLANT
KIT ROOM TURNOVER APOR (KITS) ×3 IMPLANT
LIGASURE IMPACT 36 18CM CVD LR (INSTRUMENTS) ×3 IMPLANT
MANIFOLD NEPTUNE II (INSTRUMENTS) ×3 IMPLANT
NEEDLE HYPO 18GX1.5 BLUNT FILL (NEEDLE) ×3 IMPLANT
NEEDLE HYPO 21X1.5 SAFETY (NEEDLE) ×3 IMPLANT
NS IRRIG 1000ML POUR BTL (IV SOLUTION) ×6 IMPLANT
PACK ABDOMINAL MAJOR (CUSTOM PROCEDURE TRAY) ×3 IMPLANT
PAD ARMBOARD 7.5X6 YLW CONV (MISCELLANEOUS) ×3 IMPLANT
PENCIL HANDSWITCHING (ELECTRODE) ×3 IMPLANT
RELOAD PROXIMATE 75MM BLUE (ENDOMECHANICALS) ×6 IMPLANT
RETRACTOR WND ALEXIS 25 LRG (MISCELLANEOUS) ×1 IMPLANT
RTRCTR WOUND ALEXIS 25CM LRG (MISCELLANEOUS) ×3
SET BASIN LINEN APH (SET/KITS/TRAYS/PACK) ×3 IMPLANT
SPONGE LAP 18X18 X RAY DECT (DISPOSABLE) ×6 IMPLANT
STAPLER GUN LINEAR PROX 60 (STAPLE) ×3 IMPLANT
STAPLER PROXIMATE 75MM BLUE (STAPLE) ×3 IMPLANT
STAPLER VASCULAR WHI V3 (STAPLE) ×3 IMPLANT
STAPLER VISISTAT (STAPLE) ×6 IMPLANT
SUCTION POOLE TIP (SUCTIONS) ×3 IMPLANT
SUT CHROMIC BN 1/2CIR 4-0 27IN (SUTURE) ×9 IMPLANT
SUT PDS AB 0 CTX 60 (SUTURE) ×6 IMPLANT
SUT SILK 3 0 SH CR/8 (SUTURE) ×6 IMPLANT
SWAB CULTURE LIQ STUART DBL (MISCELLANEOUS) ×3 IMPLANT
SYR 20CC LL (SYRINGE) ×3 IMPLANT
TOWEL OR 17X26 4PK STRL BLUE (TOWEL DISPOSABLE) ×6 IMPLANT
TRAY FOLEY CATH 16FR SILVER (SET/KITS/TRAYS/PACK) ×3 IMPLANT
TUBE ANAEROBIC PORT A CUL  W/M (MISCELLANEOUS) ×3 IMPLANT
YANKAUER SUCT BULB TIP 10FT TU (MISCELLANEOUS) ×3 IMPLANT
YANKAUER SUCT BULB TIP NO VENT (SUCTIONS) IMPLANT

## 2015-01-01 NOTE — Op Note (Signed)
Patient:  [name]  DOB:  Feb 12, 1966  MRN:  [id number]   Preop Diagnosis:  Sigmoid diverticulitis, enlarged appendix  Postop Diagnosis:  Same  Procedure:  Partial colectomy, incidental appendectomy  Surgeon:  Aviva Signs, M.D.  Anes:  Gen. endotracheal  Indications:  Patient is a 49 year old white male who presented back to the hospital after failing medical treatment for sigmoid diverticulitis. He has mesenteric edema, but no evidence of free perforation or intra-abdominal abscess. In addition, his appendix looks somewhat enlarged. Patient now comes the operating room for a sigmoid colectomy with incidental appendectomy. The risks and benefits of the procedure including bleeding, infection, cardiopulmonary difficulties, the possibly of a blood transfusion, the possibility of an anastomotic leak, and the possibility of a colostomy were fully explained to the patient, who gave informed consent.  Procedure note:  The patient is placed the supine position. After induction of general endotracheal anesthesia, the abdomen was prepped and draped using usual sterile technique with ChloraPrep. Surgical site confirmation was performed.  A midline incision was made from just above the umbilicus to suprapubic region. The peritoneal cavity was entered into without difficulty. The bowel was noted to be somewhat dilated and an ileus-like fashion. Cultures of the peritoneal fluid were taken, though no discrete abscess was found. In the midportion the sigmoid colon, omentum was noted to be present over the inflamed serosa. This was freed away without difficulty. The mesentery was noted to be edematous. Again, no free perforation was found. The left colon and sigmoid colon were mobilized along its. No reflection. Care was taken to avoid the left ureter. A GIA 75 stapler was placed across the distal sigmoid colon and fired. This was likewise done to the proximal sigmoid colon. The mesentery was divided  using the LigaSure. The specimen was then removed from the operative field. In order to facilitate a tension-free repair, a 2 layered handsewn anastomosis was performed end-to-end. The inner layer was a 4-0 chromic gut running suture in the Connell-like fashion and the outer layer was bolstered using 3-0 silk interrupted sutures. Surrounding omentum was then used to cover the anastomosis. A widely patent anastomosis was found. The ends were certainly viable.  Next, the appendix was visualized. It was noted to be mildly dilated, but not acutely inflamed. The mesoappendix was divided using the LigaSure. A TA-30 stapler was placed across the base the appendix and fired. The appendix was then amputated with a scalpel and removed from the operative field. The mucosal and was cauterized using Bovie electrocautery. The abdominal cavity was then copiously irrigated normal saline. All fluid was then evacuated from the abdominal cavity. All operating personnel changed gowns and gloves. A new setup was then used.  The fascia was reapproximated using an 0 PDS running suture. Subcutaneous layer was irrigated normal saline. Exparel was instilled the surrounding wound. The incision was closed using staples. Betadine ointment and dry sterile dressings were applied.  All tape and needle counts were correct at the end of the procedure. Patient was extubated in the operating room and transferred to PACU in stable condition.  Complications:  None  EBL:  250 mL  Specimen:  Sigmoid colon, appendix

## 2015-01-01 NOTE — Anesthesia Preprocedure Evaluation (Signed)
Anesthesia Evaluation  Patient identified by MRN, date of birth, ID band Patient awake    Reviewed: Allergy & Precautions, NPO status , Patient's Chart, lab work & pertinent test results, reviewed documented beta blocker date and time   Airway Mallampati: II  TM Distance: >3 FB Neck ROM: Full    Dental  (+) Teeth Intact   Pulmonary Current Smoker,  breath sounds clear to auscultation        Cardiovascular hypertension, Pt. on home beta blockers and Pt. on medications Rhythm:Regular Rate:Normal     Neuro/Psych PSYCHIATRIC DISORDERS Anxiety    GI/Hepatic GERD- (occasional)  ,  Endo/Other    Renal/GU      Musculoskeletal  (+) Arthritis -,   Abdominal   Peds  Hematology   Anesthesia Other Findings   Reproductive/Obstetrics                             Anesthesia Physical Anesthesia Plan  ASA: II  Anesthesia Plan: General   Post-op Pain Management:    Induction: Intravenous, Rapid sequence and Cricoid pressure planned  Airway Management Planned: Oral ETT  Additional Equipment:   Intra-op Plan:   Post-operative Plan: Extubation in OR  Informed Consent: I have reviewed the patients History and Physical, chart, labs and discussed the procedure including the risks, benefits and alternatives for the proposed anesthesia with the patient or authorized representative who has indicated his/her understanding and acceptance.     Plan Discussed with:   Anesthesia Plan Comments:         Anesthesia Quick Evaluation

## 2015-01-01 NOTE — Progress Notes (Signed)
Wants bedpan to void. Oriented to foley. Uncooperative. Continues to demand to get up to BR. Informed pt was unable to get out of bed or sit on side of bed because of sedation. Placed on bedpan per request.

## 2015-01-01 NOTE — Progress Notes (Signed)
Oriented to foley. Bedpan removed. Continues to want to get OOB to go to BR. Informed pt he was not getting OOB because of sedation. Continues c/o postop abd pain and penis pressure from foley.

## 2015-01-01 NOTE — Progress Notes (Signed)
From OR. Arousing. Moaning/groaning. Wants to get out of bed to pee. Oriented to place per nurse. Oriented to foley and showed pt foley bag with urine. Continues to want to get out of bed and go to BR.

## 2015-01-01 NOTE — Care Management Utilization Note (Signed)
UR completed 

## 2015-01-01 NOTE — Progress Notes (Signed)
Received report from [name], [name] in post-op. Patient arrived to the floor. Patients vs were within normal limits. Patient was drowsy but would respond to voice and touch. Patients midline incision was clean, dry, and intact. Will continue to monitor patient at this time.

## 2015-01-01 NOTE — Progress Notes (Signed)
Patient drank small amount of Go-Lytely.  Continued to have yellow liquid stools.  States has not eaten since Friday 12/29/14 at 1:00 PM.

## 2015-01-01 NOTE — Anesthesia Procedure Notes (Signed)
Procedure Name: Intubation Date/Time: 01/01/2015 1:40 PM Performed by: Andree Elk, Aviv Lengacher A Pre-anesthesia Checklist: Patient identified, Patient being monitored, Timeout performed, Emergency Drugs available and Suction available Patient Re-evaluated:Patient Re-evaluated prior to inductionOxygen Delivery Method: Circle System Utilized Preoxygenation: Pre-oxygenation with 100% oxygen Intubation Type: IV induction, Rapid sequence and Cricoid Pressure applied Laryngoscope Size: 3 and Miller Grade View: Grade I Tube type: Oral Tube size: 7.0 mm Number of attempts: 1 Airway Equipment and Method: Stylet Placement Confirmation: ETT inserted through vocal cords under direct vision,  positive ETCO2 and breath sounds checked- equal and bilateral Secured at: 21 cm Tube secured with: Tape Dental Injury: Teeth and Oropharynx as per pre-operative assessment

## 2015-01-01 NOTE — Progress Notes (Signed)
CHG bath done night before surgery and this morning per order.

## 2015-01-01 NOTE — Transfer of Care (Signed)
Immediate Anesthesia Transfer of Care Note  Patient: [name]  Procedure(s) Performed: Procedure(s): PARTIAL COLECTOMY (N/A) INCIDENTAL APPENDECTOMY (N/A)  Patient Location: PACU  Anesthesia Type:General  Level of Consciousness: awake and patient cooperative  Airway & Oxygen Therapy: Patient Spontanous Breathing and Patient connected to face mask oxygen  Post-op Assessment: Report given to RN, Post -op Vital signs reviewed and stable and Patient moving all extremities  Post vital signs: Reviewed and stable  Last Vitals:  Filed Vitals:   01/01/15 1325  BP: 133/97  Pulse:   Temp:   Resp: 21    Complications: No apparent anesthesia complications

## 2015-01-02 ENCOUNTER — Encounter (HOSPITAL_COMMUNITY): Payer: Self-pay | Admitting: General Surgery

## 2015-01-02 LAB — BASIC METABOLIC PANEL
Anion gap: 9 (ref 5–15)
BUN: 5 mg/dL — ABNORMAL LOW (ref 6–23)
CALCIUM: 8.1 mg/dL — AB (ref 8.4–10.5)
CO2: 25 mmol/L (ref 19–32)
CREATININE: 0.71 mg/dL (ref 0.50–1.35)
Chloride: 101 mmol/L (ref 96–112)
GFR calc Af Amer: >90 mL/min (ref >90)
GFR calc non Af Amer: >90 mL/min (ref >90)
Glucose, Bld: 121 mg/dL — ABNORMAL HIGH (ref 70–99)
Potassium: 3.1 mmol/L — ABNORMAL LOW (ref 3.5–5.1)
Sodium: 135 mmol/L (ref 135–145)

## 2015-01-02 LAB — URINE CULTURE
COLONY COUNT: NO GROWTH
Culture: NO GROWTH
Special Requests: NORMAL

## 2015-01-02 LAB — MAGNESIUM: MAGNESIUM: 1.3 mg/dL — AB (ref 1.5–2.5)

## 2015-01-02 LAB — CBC
HCT: 41.9 % (ref 39.0–52.0)
Hemoglobin: 13.8 g/dL (ref 13.0–17.0)
MCH: 31.2 pg (ref 26.0–34.0)
MCHC: 32.9 g/dL (ref 30.0–36.0)
MCV: 94.8 fL (ref 78.0–100.0)
PLATELETS: 319 10*3/uL (ref 150–400)
RBC: 4.42 MIL/uL (ref 4.22–5.81)
RDW: 13.4 % (ref 11.5–15.5)
WBC: 21.9 10*3/uL — AB (ref 4.0–10.5)

## 2015-01-02 LAB — PHOSPHORUS: PHOSPHORUS: 2.7 mg/dL (ref 2.3–4.6)

## 2015-01-02 MED ORDER — HYDROMORPHONE HCL 1 MG/ML IJ SOLN
2.0000 mg | INTRAMUSCULAR | Status: DC | PRN
  Administered 2015-01-02 – 2015-01-05 (×26): 2 mg via INTRAVENOUS
  Filled 2015-01-02 (×26): qty 2

## 2015-01-02 MED ORDER — VENLAFAXINE HCL ER 75 MG PO CP24
150.0000 mg | ORAL_CAPSULE | Freq: Every day | ORAL | Status: DC
  Administered 2015-01-02 – 2015-01-05 (×4): 150 mg via ORAL
  Filled 2015-01-02 (×4): qty 2

## 2015-01-02 MED ORDER — ATENOLOL 25 MG PO TABS
50.0000 mg | ORAL_TABLET | Freq: Two times a day (BID) | ORAL | Status: DC
  Administered 2015-01-02 – 2015-01-05 (×7): 50 mg via ORAL
  Filled 2015-01-02 (×7): qty 2

## 2015-01-02 MED ORDER — POTASSIUM CHLORIDE CRYS ER 20 MEQ PO TBCR
20.0000 meq | EXTENDED_RELEASE_TABLET | Freq: Three times a day (TID) | ORAL | Status: DC
  Administered 2015-01-02 – 2015-01-03 (×4): 20 meq via ORAL
  Filled 2015-01-02 (×4): qty 1

## 2015-01-02 MED ORDER — MAGNESIUM SULFATE 2 GM/50ML IV SOLN
2.0000 g | Freq: Once | INTRAVENOUS | Status: AC
  Administered 2015-01-02: 2 g via INTRAVENOUS
  Filled 2015-01-02: qty 50

## 2015-01-02 MED ORDER — KCL IN DEXTROSE-NACL 40-5-0.45 MEQ/L-%-% IV SOLN
INTRAVENOUS | Status: DC
  Administered 2015-01-02 – 2015-01-04 (×3): via INTRAVENOUS

## 2015-01-02 NOTE — Progress Notes (Signed)
ANTIBIOTIC CONSULT NOTE  Pharmacy Consult for Zosyn Indication: intra-abdominal infection, post op partial colectomy, incidental appy  No Known Allergies  Patient Measurements: Height: 5\' 8"  (172.7 cm) Weight: 192 lb 0.3 oz (87.1 kg) IBW/kg (Calculated) : 68.4 Last Recorded Body weight: 85.9kg  Vital Signs: Temp: 100.4 F (38 C) (02/09 0540) Temp Source: Oral (02/09 0540) BP: 167/96 mmHg (02/09 0540) Pulse Rate: 139 (02/09 0540) Intake/Output from previous day: 02/08 0701 - 02/09 0700 In: 3250 [I.V.:3200; IV Piggyback:50] Out: 1150 [Urine:900; Blood:250] Intake/Output from this shift:    Labs:  Recent Labs  12/31/14 0636 01/01/15 0544 01/02/15 0620  WBC 17.5* 19.1* 21.9*  HGB 14.9 14.1 13.8  PLT 270 278 319  CREATININE 0.74 0.82 0.71   Estimated Creatinine Clearance: 118.6 mL/min (by C-G formula based on Cr of 0.71). No results for input(s): VANCOTROUGH, VANCOPEAK, VANCORANDOM, GENTTROUGH, GENTPEAK, GENTRANDOM, TOBRATROUGH, TOBRAPEAK, TOBRARND, AMIKACINPEAK, AMIKACINTROU, AMIKACIN in the last 72 hours.   Microbiology: Recent Results (from the past 720 hour(s))  Culture, blood (routine x 2)     Status: None (Preliminary result)   Collection Time: 12/30/14  4:53 PM  Result Value Ref Range Status   Specimen Description BLOOD LEFT HAND  Final   Special Requests BOTTLES DRAWN AEROBIC ONLY 6CC  Final   Culture NO GROWTH 3 DAYS  Final   Report Status PENDING  Incomplete  Culture, blood (routine x 2)     Status: None (Preliminary result)   Collection Time: 12/30/14  4:53 PM  Result Value Ref Range Status   Specimen Description BLOOD LEFT HAND  Final   Special Requests BOTTLES DRAWN AEROBIC ONLY 6CC  Final   Culture NO GROWTH 3 DAYS  Final   Report Status PENDING  Incomplete  Urine culture     Status: None   Collection Time: 12/30/14  6:56 PM  Result Value Ref Range Status   Specimen Description URINE, CATHETERIZED  Final   Special Requests Normal  Final   Colony  Count NO GROWTH Performed at [facility]   Final   Culture NO GROWTH Performed at [facility]   Final   Report Status 01/02/2015 FINAL  Final  Surgical pcr screen     Status: None   Collection Time: 01/01/15  2:33 AM  Result Value Ref Range Status   MRSA, PCR NEGATIVE NEGATIVE Final   Staphylococcus aureus NEGATIVE NEGATIVE Final    Comment:        The Xpert SA Assay (FDA approved for NASAL specimens in patients over 70 years of age), is one component of a comprehensive surveillance program.  Test performance has been validated by [facility] for patients greater than or equal to 88 year old. It is not intended to diagnose infection nor to guide or monitor treatment.   Anaerobic culture     Status: None (Preliminary result)   Collection Time: 01/01/15  2:08 PM  Result Value Ref Range Status   Specimen Description FLUID PERITONEAL  Final   Special Requests ZOSYN 3.375g  Final   Gram Stain   Final    ABUNDANT WBC PRESENT, PREDOMINANTLY PMN NO ORGANISMS SEEN Performed at [facility]    Culture   Final    NO ANAEROBES ISOLATED; CULTURE IN PROGRESS FOR 5 DAYS Performed at [facility]    Report Status PENDING  Incomplete  Body fluid culture     Status: None (Preliminary result)   Collection Time: 01/01/15  2:08 PM  Result Value Ref  Range Status   Specimen Description FLUID PERITONEAL  Final   Special Requests ZOSYN 3.375g  Final   Gram Stain   Final    ABUNDANT WBC PRESENT, PREDOMINANTLY PMN NO ORGANISMS SEEN Performed at [facility]    Culture NO GROWTH Performed at [facility]   Final   Report Status PENDING  Incomplete    Anti-infectives    Start     Dose/Rate Route Frequency Ordered Stop   12/31/14 1400  neomycin (MYCIFRADIN) tablet 1,000 mg     1,000 mg Oral 3 times per day 12/31/14 1102 01/01/15 0106   12/31/14 1400  erythromycin (E-MYCIN) tablet 1,000 mg     1,000 mg Oral 3 times per day 12/31/14 1102  01/01/15 0106   12/31/14 0200  piperacillin-tazobactam (ZOSYN) IVPB 3.375 g     3.375 g12.5 mL/hr over 240 Minutes Intravenous Every 8 hours 12/30/14 2130     12/30/14 1815  piperacillin-tazobactam (ZOSYN) IVPB 3.375 g     3.375 g100 mL/hr over 30 Minutes Intravenous  Once 12/30/14 1800 12/30/14 1925      Assessment: [name] who presents with severe LLQ pain.  He was recently discharged on 2/4 on Augmentin with diverticulitis. Pharmacy asked to resume IV antibiotics for diverticulosis of colon with microperforation.   WBC elevated, but patient remains afebrile and lactic acid is normal.   Post op partial colectomy, incidental appy Renal function is at patient's baseline.   Zosyn 2/6>> Augmentin 2/4>>2/6 Cipro 2/2>>2/4 Flagyl 2/2>>2/4  Goal of Therapy:  Eradicate infection.  Plan:  Zosyn 3.375gm IV Q8h to be infused over 4hrs Monitor renal function and cx data   [name] A 01/02/2015,11:28 AM

## 2015-01-02 NOTE — Anesthesia Postprocedure Evaluation (Signed)
  Anesthesia Post-op Note  Patient: [name]  Procedure(s) Performed: Procedure(s): PARTIAL COLECTOMY (N/A) INCIDENTAL APPENDECTOMY (N/A)  Patient Location: Room 312  Anesthesia Type:General  Level of Consciousness: awake, alert , oriented and patient cooperative  Airway and Oxygen Therapy: Patient Spontanous Breathing  Post-op Pain: moderate  Post-op Assessment: Post-op Vital signs reviewed, Patient's Cardiovascular Status Stable, Respiratory Function Stable, Patent Airway and No signs of Nausea or vomiting  Post-op Vital Signs: Reviewed and stable  Last Vitals:  Filed Vitals:   01/02/15 0540  BP: 167/96  Pulse: 139  Temp: 38 C  Resp: 18    Complications: No apparent anesthesia complications

## 2015-01-02 NOTE — Progress Notes (Signed)
1 Day Post-Op  Subjective: Moderate incisional pain. States he did have a bowel movement yesterday evening.  Objective: Vital signs in last 24 hours: Temp:  [98 F (36.7 C)-100.4 F (38 C)] 100.4 F (38 C) (02/09 0540) Pulse Rate:  [113-139] 139 (02/09 0540) Resp:  [14-31] 18 (02/09 0540) BP: (123-167)/(83-101) 167/96 mmHg (02/09 0540) SpO2:  [92 %-99 %] 95 % (02/09 0540) Last BM Date: 01/01/15  Intake/Output from previous day: 02/08 0701 - 02/09 0700 In: 3250 [I.V.:3200; IV Piggyback:50] Out: 1150 [Urine:900; Blood:250] Intake/Output this shift:    General appearance: alert and cooperative Resp: clear to auscultation bilaterally Cardio: Tachycardic but no S3, S4, murmurs. GI: Soft, incision healing well.  Lab Results:   Recent Labs  01/01/15 0544 01/02/15 0620  WBC 19.1* 21.9*  HGB 14.1 13.8  HCT 43.3 41.9  PLT 278 319   BMET  Recent Labs  01/01/15 0544 01/02/15 0620  NA 134* 135  K 3.4* 3.1*  CL 101 101  CO2 26 25  GLUCOSE 137* 121*  BUN 6 5*  CREATININE 0.82 0.71  CALCIUM 8.5 8.1*   PT/INR No results for input(s): LABPROT, INR in the last 72 hours.  Studies/Results: No results found.  Anti-infectives: Anti-infectives    Start     Dose/Rate Route Frequency Ordered Stop   12/31/14 1400  neomycin (MYCIFRADIN) tablet 1,000 mg     1,000 mg Oral 3 times per day 12/31/14 1102 01/01/15 0106   12/31/14 1400  erythromycin (E-MYCIN) tablet 1,000 mg     1,000 mg Oral 3 times per day 12/31/14 1102 01/01/15 0106   12/31/14 0200  piperacillin-tazobactam (ZOSYN) IVPB 3.375 g     3.375 g12.5 mL/hr over 240 Minutes Intravenous Every 8 hours 12/30/14 2130     12/30/14 1815  piperacillin-tazobactam (ZOSYN) IVPB 3.375 g     3.375 g100 mL/hr over 30 Minutes Intravenous  Once 12/30/14 1800 12/30/14 1925      Assessment/Plan: s/p Procedure(s): PARTIAL COLECTOMY INCIDENTAL APPENDECTOMY Impression: Patient is tachycardic postoperatively. He does have a history  of being on a beta blocker. He was tachycardic during surgery. He denies any chest pain. Will monitor on telemetry. Will restart Tenormin. Continue IV metoprolol. Will DC Foley.  LOS: 3 days    Mindy Gali A 01/02/2015

## 2015-01-02 NOTE — Care Management Note (Addendum)
    Page 1 of 1   01/05/2015     11:00:45 AM CARE MANAGEMENT NOTE 01/05/2015  Patient:  [name], [name]   Account Number:  [id number]  Date Initiated:  01/02/2015  Documentation initiated by:  Jolene Provost  Subjective/Objective Assessment:   Pt is from home, lives with wife, admitted s/p colectomy and appendectomy. Pt ind at home and has no HH services, DME's or med needs prior to admission. Pt plans to dishcarge home with self care.     Action/Plan:   Will cont to follow for CM needs.   Anticipated DC Date:  01/05/2015   Anticipated DC Plan:  [place]  CM consult      Choice offered to / List presented to:             Status of service:  Completed, signed off Medicare Important Message given?   (If response is "NO", the following Medicare IM given date fields will be blank) Date Medicare IM given:   Medicare IM given by:   Date Additional Medicare IM given:   Additional Medicare IM given by:    Discharge Disposition:  HOME/SELF CARE  Per UR Regulation:  Reviewed for med. necessity/level of care/duration of stay  If discussed at [place] of Stay Meetings, dates discussed:   01/04/2015    Comments:  01/03/2015 [place], RN, MSN, CM Pt plans for discharge home today. No CM needs identified. 01/02/2015 [place], RN, MSN, CM

## 2015-01-03 LAB — BASIC METABOLIC PANEL
Anion gap: 8 (ref 5–15)
BUN: 7 mg/dL (ref 6–23)
CHLORIDE: 101 mmol/L (ref 96–112)
CO2: 27 mmol/L (ref 19–32)
Calcium: 8.2 mg/dL — ABNORMAL LOW (ref 8.4–10.5)
Creatinine, Ser: 0.66 mg/dL (ref 0.50–1.35)
GFR calc Af Amer: >90 mL/min (ref >90)
GFR calc non Af Amer: >90 mL/min (ref >90)
Glucose, Bld: 98 mg/dL (ref 70–99)
POTASSIUM: 4.2 mmol/L (ref 3.5–5.1)
Sodium: 136 mmol/L (ref 135–145)

## 2015-01-03 LAB — CBC
HCT: 41.2 % (ref 39.0–52.0)
HEMOGLOBIN: 13.3 g/dL (ref 13.0–17.0)
MCH: 31.1 pg (ref 26.0–34.0)
MCHC: 32.3 g/dL (ref 30.0–36.0)
MCV: 96.5 fL (ref 78.0–100.0)
PLATELETS: 355 10*3/uL (ref 150–400)
RBC: 4.27 MIL/uL (ref 4.22–5.81)
RDW: 13.5 % (ref 11.5–15.5)
WBC: 19.4 10*3/uL — ABNORMAL HIGH (ref 4.0–10.5)

## 2015-01-03 LAB — PHOSPHORUS: PHOSPHORUS: 3.3 mg/dL (ref 2.3–4.6)

## 2015-01-03 LAB — MAGNESIUM: Magnesium: 1.9 mg/dL (ref 1.5–2.5)

## 2015-01-03 NOTE — Progress Notes (Signed)
2 Days Post-Op  Subjective: Patient has had another bowel movement yesterday afternoon. Tolerating clear liquid diet well. Pain is under better control with increased Dilaudid.  Objective: Vital signs in last 24 hours: Temp:  [98.6 F (37 C)-98.9 F (37.2 C)] 98.6 F (37 C) (02/10 0450) Pulse Rate:  [84-93] 84 (02/10 0450) Resp:  [17-18] 18 (02/10 0450) BP: (120-133)/(85-87) 133/85 mmHg (02/10 0450) SpO2:  [93 %-94 %] 94 % (02/10 0450) Last BM Date: 01/01/15  Intake/Output from previous day: 02/09 0701 - 02/10 0700 In: 240 [P.O.:240] Out: -  Intake/Output this shift:    General appearance: alert, cooperative and no distress Resp: clear to auscultation bilaterally Cardio: regular rate and rhythm, S1, S2 normal, no murmur, click, rub or gallop GI: Soft. Incision healing well. Bowel sounds appreciated.  Lab Results:   Recent Labs  01/02/15 0620 01/03/15 0612  WBC 21.9* 19.4*  HGB 13.8 13.3  HCT 41.9 41.2  PLT 319 355   BMET  Recent Labs  01/02/15 0620 01/03/15 0612  NA 135 136  K 3.1* 4.2  CL 101 101  CO2 25 27  GLUCOSE 121* 98  BUN 5* 7  CREATININE 0.71 0.66  CALCIUM 8.1* 8.2*   PT/INR No results for input(s): LABPROT, INR in the last 72 hours.  Studies/Results: No results found.  Anti-infectives: Anti-infectives    Start     Dose/Rate Route Frequency Ordered Stop   12/31/14 1400  neomycin (MYCIFRADIN) tablet 1,000 mg     1,000 mg Oral 3 times per day 12/31/14 1102 01/01/15 0106   12/31/14 1400  erythromycin (E-MYCIN) tablet 1,000 mg     1,000 mg Oral 3 times per day 12/31/14 1102 01/01/15 0106   12/31/14 0200  piperacillin-tazobactam (ZOSYN) IVPB 3.375 g     3.375 g 12.5 mL/hr over 240 Minutes Intravenous Every 8 hours 12/30/14 2130     12/30/14 1815  piperacillin-tazobactam (ZOSYN) IVPB 3.375 g     3.375 g 100 mL/hr over 30 Minutes Intravenous  Once 12/30/14 1800 12/30/14 1925      Assessment/Plan: s/p Procedure(s): PARTIAL  COLECTOMY INCIDENTAL APPENDECTOMY Impression: Stable on postoperative day 2. Continues to recover from surgery well. We'll advance to full liquid diet. Potassium has normalized. Continue IV Zosyn at this time.  LOS: 4 days    [name] A 01/03/2015

## 2015-01-04 LAB — CULTURE, BLOOD (ROUTINE X 2)
Culture: NO GROWTH
Culture: NO GROWTH

## 2015-01-04 LAB — CBC
HCT: 38.5 % — ABNORMAL LOW (ref 39.0–52.0)
Hemoglobin: 12.5 g/dL — ABNORMAL LOW (ref 13.0–17.0)
MCH: 30.9 pg (ref 26.0–34.0)
MCHC: 32.5 g/dL (ref 30.0–36.0)
MCV: 95.1 fL (ref 78.0–100.0)
PLATELETS: 357 10*3/uL (ref 150–400)
RBC: 4.05 MIL/uL — AB (ref 4.22–5.81)
RDW: 13.1 % (ref 11.5–15.5)
WBC: 11.3 10*3/uL — AB (ref 4.0–10.5)

## 2015-01-04 LAB — TYPE AND SCREEN
ABO/RH(D): O POS
ANTIBODY SCREEN: NEGATIVE
Unit division: 0
Unit division: 0

## 2015-01-04 LAB — BASIC METABOLIC PANEL
Anion gap: 9 (ref 5–15)
BUN: 7 mg/dL (ref 6–23)
CALCIUM: 8.4 mg/dL (ref 8.4–10.5)
CO2: 27 mmol/L (ref 19–32)
CREATININE: 0.68 mg/dL (ref 0.50–1.35)
Chloride: 100 mmol/L (ref 96–112)
GFR calc Af Amer: >90 mL/min (ref >90)
GFR calc non Af Amer: >90 mL/min (ref >90)
GLUCOSE: 94 mg/dL (ref 70–99)
Potassium: 3.8 mmol/L (ref 3.5–5.1)
Sodium: 136 mmol/L (ref 135–145)

## 2015-01-04 NOTE — Progress Notes (Signed)
3 Days Post-Op  Subjective: Had 2 bowel movements yesterday. Tolerating full liquid diet well. Denies any significant abdominal pain.  Objective: Vital signs in last 24 hours: Temp:  [97.5 F (36.4 C)-99 F (37.2 C)] 98.6 F (37 C) (02/11 0523) Pulse Rate:  [76-79] 77 (02/11 0523) Resp:  [20] 20 (02/11 0523) BP: (123-133)/(79-80) 133/80 mmHg (02/11 0523) SpO2:  [93 %-97 %] 93 % (02/11 0523) Last BM Date: 01/02/15  Intake/Output from previous day: 02/10 0701 - 02/11 0700 In: 840 [P.O.:840] Out: 100 [Urine:100] Intake/Output this shift:    General appearance: alert, cooperative and no distress Resp: clear to auscultation bilaterally Cardio: regular rate and rhythm, S1, S2 normal, no murmur, click, rub or gallop GI: Soft. Incision healing well. Minimal ecchymosis noted. No rigidity noted. Bowel sounds active.  Lab Results:   Recent Labs  01/03/15 0612 01/04/15 0557  WBC 19.4* 11.3*  HGB 13.3 12.5*  HCT 41.2 38.5*  PLT 355 357   BMET  Recent Labs  01/03/15 0612 01/04/15 0557  NA 136 136  K 4.2 3.8  CL 101 100  CO2 27 27  GLUCOSE 98 94  BUN 7 7  CREATININE 0.66 0.68  CALCIUM 8.2* 8.4   PT/INR No results for input(s): LABPROT, INR in the last 72 hours.  Studies/Results: No results found.  Anti-infectives: Anti-infectives    Start     Dose/Rate Route Frequency Ordered Stop   12/31/14 1400  neomycin (MYCIFRADIN) tablet 1,000 mg     1,000 mg Oral 3 times per day 12/31/14 1102 01/01/15 0106   12/31/14 1400  erythromycin (E-MYCIN) tablet 1,000 mg     1,000 mg Oral 3 times per day 12/31/14 1102 01/01/15 0106   12/31/14 0200  piperacillin-tazobactam (ZOSYN) IVPB 3.375 g     3.375 g 12.5 mL/hr over 240 Minutes Intravenous Every 8 hours 12/30/14 2130     12/30/14 1815  piperacillin-tazobactam (ZOSYN) IVPB 3.375 g     3.375 g 100 mL/hr over 30 Minutes Intravenous  Once 12/30/14 1800 12/30/14 1925      Assessment/Plan: s/p Procedure(s): PARTIAL  COLECTOMY INCIDENTAL APPENDECTOMY Impression: Continues to progress well. Leukocytosis resolving. We'll advance to soft diet. Anticipate discharge next 24-48 hours.  LOS: 5 days    [name] A 01/04/2015

## 2015-01-04 NOTE — Care Management Utilization Note (Signed)
UR completed 

## 2015-01-05 LAB — BODY FLUID CULTURE

## 2015-01-05 MED ORDER — OXYCODONE-ACETAMINOPHEN 7.5-325 MG PO TABS: 1.0000 | ORAL_TABLET | ORAL | Status: DC | PRN

## 2015-01-05 NOTE — Discharge Instructions (Signed)
Open Colectomy, Care After Refer to this sheet in the next few weeks. These instructions provide you with information on caring for yourself after your procedure. Your health care provider may also give you more specific instructions. Your treatment has been planned according to current medical practices, but problems sometimes occur. Call your health care provider if you have any problems or questions after your procedure. WHAT TO EXPECT AFTER THE PROCEDURE After your procedure, it is typical to have the following:  Pain in your abdomen, especially along your incision. You will be given medicines to control the pain.  Tiredness. This is a normal part of the recovery process. Your energy level will return to normal over the next several weeks.  Constipation. You may be given a stool softener to prevent this. HOME CARE INSTRUCTIONS  Only take over-the-counter or prescription medicines as directed by your health care provider.  Ask your health care provider whether you may take a shower when you go home.  You may resume a normal diet and activities as directed. Eat plenty of fruits and vegetables to help prevent constipation.  Drink enough fluids to keep your urine clear or pale yellow. This also helps prevent constipation.  Take rest breaks during the day as needed.  Avoid lifting anything heavier than 25 pounds (11.3 kg) or driving for 4 weeks or until your health care provider says it is okay.  Follow up with your health care provider as directed. Ask your health care provider when you need to return to have your stitches or staples removed. SEEK MEDICAL CARE IF:  You have redness, swelling, or increasing pain in the incision area.  You see pus coming from the incision area.  You have a fever. SEEK IMMEDIATE MEDICAL CARE IF:   You have chest pain or shortness of breath.  You have pain or swelling in your legs.  You have persistent nausea and vomiting.  Your wound breaks open  after stitches or staples have been removed.  You have increasing abdominal pain that is not relieved with medicine. Document Released: 06/03/2011 Document Revised: 08/31/2013 Document Reviewed: 06/22/2013 [facility] Patient Information 2015 [place], [place]. This information is not intended to replace advice given to you by your health care provider. Make sure you discuss any questions you have with your health care provider.

## 2015-01-05 NOTE — Progress Notes (Signed)
Patient discharged home with wife.  IVs removed - WNL.  Reviewed medications and educated on s/s of infection of incision and when to call MD.  Instructed to not lift or drive until cleared by MD.  Instructed in incisional care. Follow up appointment in place. Instructed not to drive when taking pain meds.  Verbalizes understanding.  No questions at this time.  Stable to DC home, assisted off unit by NT.

## 2015-01-05 NOTE — Discharge Summary (Signed)
Physician Discharge Summary  Patient ID: [name] MRN: [id number] DOB/AGE: March 20, 1966 49 y.o.  Admit date: 12/30/2014 Discharge date: 01/05/2015  Admission Diagnoses: Sigmoid diverticulitis, failure of medical therapy  Discharge Diagnoses: Same Principal Problem:   Diverticulitis of colon with perforation Active Problems:   Essential hypertension   Tobacco abuse   GAD (generalized anxiety disorder)   Hyperglycemia   Colonic diverticular abscess   ETOH abuse   Disorder of appendix   Discharged Condition: good  Hospital Course: Patient is a 49 year old white male with a known history sigmoid diverticulitis who presented back to the hospital with worsening leukocytosis and worsening mesenteric edema within the sigmoid colon. He was started on IV antibiotics and bowel rest. He subsequently underwent a partial colectomy with incidental appendectomy on 01/01/2015. Tolerated procedure well. His postoperative course has been for the most part unremarkable. His diet was advanced without difficulty. His leukocytosis has resolved. He is being discharged home on postoperative day 4 in good and improving condition.  Treatments: surgery: Partial colectomy, incidental appendectomy on 01/01/2015  Discharge Exam: Blood pressure 153/86, pulse 72, temperature 97.6 F (36.4 C), temperature source Oral, resp. rate 16, height 5\' 8"  (1.727 m), weight 87.1 kg (192 lb 0.3 oz), SpO2 94 %. General appearance: alert, cooperative and no distress Resp: clear to auscultation bilaterally Cardio: regular rate and rhythm, S1, S2 normal, no murmur, click, rub or gallop GI: Soft, incision healing well.  Disposition: 01-Home or Self Care     Medication List    TAKE these medications        amoxicillin-clavulanate 875-125 MG per tablet  Commonly known as:  AUGMENTIN  Take 1 tablet by mouth every 12 (twelve) hours.     atenolol 50 MG tablet  Commonly known as:  TENORMIN  TAKE 1 TABLET BY MOUTH  EVERY DAY     clonazePAM 0.5 MG tablet  Commonly known as:  KLONOPIN  TAKE 1 TABLET BY MOUTH TWICE A DAY AS NEEDED - MUST LAST 30 DAYS     cyclobenzaprine 10 MG tablet  Commonly known as:  FLEXERIL  TAKE 1 TABLET BY MOUTH AT BEDTIME AS NEEDED     fluticasone 50 MCG/ACT nasal spray  Commonly known as:  FLONASE  Place 2 sprays into both nostrils daily.     HYDROcodone-acetaminophen 5-325 MG per tablet  Commonly known as:  NORCO/VICODIN  Take 1 tablet by mouth every 6 (six) hours as needed for severe pain.     NASAL SALINE NA  Place 1 spray into the nose daily as needed. For nose bleeds     oxyCODONE-acetaminophen 7.5-325 MG per tablet  Commonly known as:  PERCOCET  Take 1-2 tablets by mouth every 4 (four) hours as needed.     simvastatin 40 MG tablet  Commonly known as:  ZOCOR  TAKE 1 TABLET BY MOUTH EVERY DAY     venlafaxine XR 150 MG 24 hr capsule  Commonly known as:  EFFEXOR-XR  TAKE 1 CAPSULE (150 MG TOTAL) BY MOUTH EVERY MORNING.           Follow-up Information    Follow up with Jamesetta So, MD. Schedule an appointment as soon as possible for a visit on 01/11/2015.   Specialty:  General Surgery   Contact information:   [id number] [id number] [phone]       Signed: Aviva Signs A 01/05/2015, 10:29 AM

## 2015-01-07 LAB — ANAEROBIC CULTURE

## 2015-01-17 ENCOUNTER — Other Ambulatory Visit: Payer: Self-pay | Admitting: Family Medicine

## 2015-01-18 NOTE — Telephone Encounter (Signed)
Ok with both

## 2015-01-18 NOTE — Telephone Encounter (Signed)
Medication called to pharmacy. 

## 2015-01-18 NOTE — Telephone Encounter (Signed)
Ok to refill??  Last office visit 12/26/2014.  Last refill 09/28/2014, #2 refills.   FYI- patient has had appendectomy since last visit.

## 2015-01-24 ENCOUNTER — Ambulatory Visit: Payer: BLUE CROSS/BLUE SHIELD | Admitting: Gastroenterology

## 2015-02-27 ENCOUNTER — Other Ambulatory Visit: Payer: Self-pay | Admitting: Family Medicine

## 2015-02-28 NOTE — Telephone Encounter (Signed)
?   OK to Refill  

## 2015-03-01 NOTE — Telephone Encounter (Signed)
ok 

## 2015-03-01 NOTE — Telephone Encounter (Signed)
?   OK to Refill  

## 2015-03-02 NOTE — Telephone Encounter (Signed)
ok 

## 2015-03-08 ENCOUNTER — Encounter: Payer: Self-pay | Admitting: Family Medicine

## 2015-03-08 ENCOUNTER — Ambulatory Visit (INDEPENDENT_AMBULATORY_CARE_PROVIDER_SITE_OTHER): Payer: BLUE CROSS/BLUE SHIELD | Admitting: Physician Assistant

## 2015-03-08 VITALS — BP 152/100 | HR 60 | Temp 97.7°F | Resp 18 | Wt 196.0 lb

## 2015-03-08 DIAGNOSIS — I10 Essential (primary) hypertension: Secondary | ICD-10-CM | POA: Diagnosis not present

## 2015-03-08 MED ORDER — LOSARTAN POTASSIUM 50 MG PO TABS: 50.0000 mg | ORAL_TABLET | Freq: Every day | ORAL | Status: DC

## 2015-03-08 NOTE — Progress Notes (Signed)
Patient ID: [name] MRN: [id number], DOB: 02-Sep-1966, 49 y.o. Date of Encounter: 03/08/2015, 3:12 PM    Chief Complaint:  Chief Complaint  Patient presents with  . discuss BP    had BP prob at surgeon office  168/118     HPI: 49 y.o. year old white male is here to evaluate high blood pressure.  States that he had an office visit with his surgeon last Thursday and blood pressure was 168/118. States that he is taking his atenolol 50 mg every day. He and his wife report that he was on Benicar along time ago. They report there was an issue about insurance covering it, so it was stopped-- and then his blood pressures have been monitored and had been staying controlled until now. He has no complaints or concerns today. Having no chest pressure heaviness no difficulty breathing. Having no neurologic deficits or stroke symptoms.      Home Meds:   Outpatient Prescriptions Prior to Visit  Medication Sig Dispense Refill  . atenolol (TENORMIN) 50 MG tablet TAKE 1 TABLET BY MOUTH EVERY DAY 90 tablet 2  . clonazePAM (KLONOPIN) 0.5 MG tablet TAKE 1 TABLET BY MOUTH TWICE A DAY AS NEEDED - MUST LAST 30 DAYS 30 tablet 2  . cyclobenzaprine (FLEXERIL) 10 MG tablet TAKE 1 TABLET BY MOUTH AT BEDTIME AS NEEDED 30 tablet 2  . fluticasone (FLONASE) 50 MCG/ACT nasal spray Place 2 sprays into both nostrils daily. 16 g 6  . HYDROcodone-acetaminophen (NORCO/VICODIN) 5-325 MG per tablet Take 1 tablet by mouth every 6 (six) hours as needed for severe pain. 20 tablet 0  . NASAL SALINE NA Place 1 spray into the nose daily as needed. For nose bleeds    . simvastatin (ZOCOR) 40 MG tablet TAKE 1 TABLET BY MOUTH EVERY DAY 90 tablet 2  . venlafaxine XR (EFFEXOR-XR) 150 MG 24 hr capsule TAKE ONE CAPSULE IN THE MORNING 90 capsule 1  . amoxicillin-clavulanate (AUGMENTIN) 875-125 MG per tablet Take 1 tablet by mouth every 12 (twelve) hours. 28 tablet 0  . oxyCODONE-acetaminophen (PERCOCET) 7.5-325 MG per  tablet Take 1-2 tablets by mouth every 4 (four) hours as needed. 50 tablet 0   No facility-administered medications prior to visit.    Allergies: No Known Allergies    Review of Systems: See HPI for pertinent ROS. All other ROS negative.    Physical Exam: Blood pressure 152/100, pulse 60, temperature 97.7 F (36.5 C), temperature source Oral, resp. rate 18, weight 196 lb (88.905 kg)., Body mass index is 29.81 kg/(m^2). General:  WNWD WM. Appears in no acute distress. Neck: Supple. No thyromegaly. No lymphadenopathy. No carotid bruits. Lungs: Clear bilaterally to auscultation without wheezes, rales, or rhonchi. Breathing is unlabored. Heart: Regular rhythm. No murmurs, rubs, or gallops. Msk:  Strength and tone normal for age. Extremities/Skin: Warm and dry.  No edema.  Neuro: Alert and oriented X 3. Moves all extremities spontaneously. Gait is normal. CNII-XII grossly in tact. Psych:  Responds to questions appropriately with a normal affect.     ASSESSMENT AND PLAN:  49 y.o. year old male with  1. Essential hypertension  Nurse blood pressure checks today are 158/110 Right and 152/100 Left. Continue atenolol 50 mg daily. Add Cozaar 50 mg daily. Schedule follow-up office visit in 2 weeks to recheck blood pressure and BMET on medication. - losartan (COZAAR) 50 MG tablet; Take 1 tablet (50 mg total) by mouth daily.  Dispense: 30 tablet; Refill: 0  Marin Olp [place], [place], [facility] 03/08/2015 3:12 PM

## 2015-03-23 ENCOUNTER — Encounter: Payer: Self-pay | Admitting: Family Medicine

## 2015-03-23 ENCOUNTER — Ambulatory Visit (INDEPENDENT_AMBULATORY_CARE_PROVIDER_SITE_OTHER): Payer: BLUE CROSS/BLUE SHIELD | Admitting: Family Medicine

## 2015-03-23 VITALS — BP 128/92 | HR 60 | Temp 98.5°F | Resp 16 | Ht 68.0 in | Wt 195.0 lb

## 2015-03-23 DIAGNOSIS — J302 Other seasonal allergic rhinitis: Secondary | ICD-10-CM | POA: Diagnosis not present

## 2015-03-23 DIAGNOSIS — I10 Essential (primary) hypertension: Secondary | ICD-10-CM | POA: Diagnosis not present

## 2015-03-23 MED ORDER — LOSARTAN POTASSIUM-HCTZ 50-12.5 MG PO TABS: 1.0000 | ORAL_TABLET | Freq: Every day | ORAL | Status: DC

## 2015-03-23 MED ORDER — FLUTICASONE PROPIONATE 50 MCG/ACT NA SUSP: 2.0000 | Freq: Every day | NASAL | Status: DC

## 2015-03-23 NOTE — Progress Notes (Signed)
Subjective:    Patient ID: [name], male    DOB: 1966-10-02, 49 y.o.   MRN: [id number]  HPI  Please see the patient's last office visit. He was started on losartan 50 mg a day for hypertension. His blood pressure has fallen from 150/100-128/92. He is tolerating the medication well without side effects. He does report a 4 day history of sneezing, postnasal drip, dry scratchy throat, and a tickle cough. He is also been having itchy eyes. Past Medical History  Diagnosis Date  . Allergy   . Hypertension   . GAD (generalized anxiety disorder)   . Diverticulitis   . ETOH abuse   . Colonic diverticular abscess 12/26/2014   Past Surgical History  Procedure Laterality Date  . Rotator cuff repair    . Hernia repair    . Hemorrhoid surgery    . Partial colectomy N/A 01/01/2015    Procedure: PARTIAL COLECTOMY;  Surgeon: Jamesetta So, MD;  Location: [facility];  Service: General;  Laterality: N/A;  . Appendectomy N/A 01/01/2015    Procedure: INCIDENTAL APPENDECTOMY;  Surgeon: Jamesetta So, MD;  Location: [facility];  Service: General;  Laterality: N/A;   Current Outpatient Prescriptions on File Prior to Visit  Medication Sig Dispense Refill  . atenolol (TENORMIN) 50 MG tablet TAKE 1 TABLET BY MOUTH EVERY DAY 90 tablet 2  . clonazePAM (KLONOPIN) 0.5 MG tablet TAKE 1 TABLET BY MOUTH TWICE A DAY AS NEEDED - MUST LAST 30 DAYS 30 tablet 2  . cyclobenzaprine (FLEXERIL) 10 MG tablet TAKE 1 TABLET BY MOUTH AT BEDTIME AS NEEDED 30 tablet 2  . losartan (COZAAR) 50 MG tablet Take 1 tablet (50 mg total) by mouth daily. 30 tablet 0  . NASAL SALINE NA Place 1 spray into the nose daily as needed. For nose bleeds    . simvastatin (ZOCOR) 40 MG tablet TAKE 1 TABLET BY MOUTH EVERY DAY 90 tablet 2  . venlafaxine XR (EFFEXOR-XR) 150 MG 24 hr capsule TAKE ONE CAPSULE IN THE MORNING 90 capsule 1   No current facility-administered medications on file prior to visit.   No Known Allergies History   Social  History  . Marital Status: Divorced    Spouse Name: N/A  . Number of Children: N/A  . Years of Education: N/A   Occupational History  . Not on file.   Social History Main Topics  . Smoking status: Current Every Day Smoker -- 1.00 packs/day    Types: Cigarettes  . Smokeless tobacco: Never Used  . Alcohol Use: 2.4 oz/week    4 Cans of beer, 0 Standard drinks or equivalent per week     Comment: patient states "about 5 or 6 a day" (beers)  . Drug Use: No  . Sexual Activity: Yes   Other Topics Concern  . Not on file   Social History Narrative     Review of Systems  All other systems reviewed and are negative.      Objective:   Physical Exam  HENT:  Right Ear: External ear normal.  Left Ear: External ear normal.  Nose: Nose normal.  Mouth/Throat: Oropharynx is clear and moist. No oropharyngeal exudate.  Eyes: Conjunctivae are normal. Pupils are equal, round, and reactive to light.  Neck: Neck supple.  Cardiovascular: Normal rate, regular rhythm and normal heart sounds.   No murmur heard. Pulmonary/Chest: Effort normal and breath sounds normal. No respiratory distress. He has no wheezes. He has no rales.  Abdominal: Soft.  Bowel sounds are normal.  Lymphadenopathy:    He has no cervical adenopathy.  Vitals reviewed.         Assessment & Plan:  Benign essential HTN - Plan: losartan-hydrochlorothiazide (HYZAAR) 50-12.5 MG per tablet, BASIC METABOLIC PANEL WITH GFR, CBC with Differential/Platelet  Seasonal allergies - Plan: fluticasone (FLONASE) 50 MCG/ACT nasal spray  Discontinue losartan and switch the patient to Hyzaar 50/12.5 one by mouth daily. Recheck blood pressure in one month. I will also check a BMP to monitor his electrolytes and renal function on the angiotensin receptor blocker. I believe his cough is due to seasonal allergies. Begin Flonase 2 sprays each nostril daily

## 2015-03-24 LAB — BASIC METABOLIC PANEL WITH GFR
BUN: 11 mg/dL (ref 6–23)
CHLORIDE: 104 meq/L (ref 96–112)
CO2: 25 meq/L (ref 19–32)
Calcium: 9.4 mg/dL (ref 8.4–10.5)
Creat: 0.82 mg/dL (ref 0.50–1.35)
GFR, Est African American: >89 mL/min
Glucose, Bld: 101 mg/dL — ABNORMAL HIGH (ref 70–99)
POTASSIUM: 4.1 meq/L (ref 3.5–5.3)
Sodium: 138 mEq/L (ref 135–145)

## 2015-03-24 LAB — CBC WITH DIFFERENTIAL/PLATELET
BASOS ABS: 0.1 10*3/uL (ref 0.0–0.1)
Basophils Relative: 1 % (ref 0–1)
EOS ABS: 0.2 10*3/uL (ref 0.0–0.7)
Eosinophils Relative: 2 % (ref 0–5)
HCT: 46.4 % (ref 39.0–52.0)
Hemoglobin: 15.5 g/dL (ref 13.0–17.0)
Lymphocytes Relative: 22 % (ref 12–46)
Lymphs Abs: 2.4 10*3/uL (ref 0.7–4.0)
MCH: 31 pg (ref 26.0–34.0)
MCHC: 33.4 g/dL (ref 30.0–36.0)
MCV: 92.8 fL (ref 78.0–100.0)
MPV: 10.3 fL (ref 8.6–12.4)
Monocytes Absolute: 1.4 10*3/uL — ABNORMAL HIGH (ref 0.1–1.0)
Monocytes Relative: 13 % — ABNORMAL HIGH (ref 3–12)
NEUTROS ABS: 6.9 10*3/uL (ref 1.7–7.7)
NEUTROS PCT: 62 % (ref 43–77)
PLATELETS: 269 10*3/uL (ref 150–400)
RBC: 5 MIL/uL (ref 4.22–5.81)
RDW: 14.4 % (ref 11.5–15.5)
WBC: 11.1 10*3/uL — AB (ref 4.0–10.5)

## 2015-03-26 ENCOUNTER — Encounter: Payer: Self-pay | Admitting: Family Medicine

## 2015-06-03 ENCOUNTER — Other Ambulatory Visit: Payer: Self-pay | Admitting: Family Medicine

## 2015-06-04 NOTE — Telephone Encounter (Signed)
LRF 02/27/15 #60 + 1  LOV 03/23/15  OK refill?

## 2015-06-04 NOTE — Telephone Encounter (Signed)
ok 

## 2015-06-05 MED ORDER — CLONAZEPAM 0.5 MG PO TABS: 0.5000 mg | ORAL_TABLET | Freq: Two times a day (BID) | ORAL | Status: DC | PRN

## 2015-06-05 NOTE — Telephone Encounter (Signed)
Medication refilled per protocol. 

## 2015-08-11 ENCOUNTER — Other Ambulatory Visit: Payer: Self-pay | Admitting: Family Medicine

## 2015-09-26 ENCOUNTER — Other Ambulatory Visit: Payer: Self-pay | Admitting: Family Medicine

## 2015-09-26 NOTE — Telephone Encounter (Signed)
Ok to refill clonazepam 

## 2015-09-27 NOTE — Telephone Encounter (Signed)
Medication refilled per protocol. 

## 2015-09-27 NOTE — Telephone Encounter (Signed)
ok 

## 2015-11-07 ENCOUNTER — Other Ambulatory Visit: Payer: Self-pay | Admitting: Family Medicine

## 2015-12-24 ENCOUNTER — Ambulatory Visit (INDEPENDENT_AMBULATORY_CARE_PROVIDER_SITE_OTHER): Payer: BLUE CROSS/BLUE SHIELD | Admitting: Family Medicine

## 2015-12-24 ENCOUNTER — Encounter: Payer: Self-pay | Admitting: Family Medicine

## 2015-12-24 VITALS — BP 118/82 | HR 78 | Temp 98.6°F | Resp 16 | Ht 68.0 in | Wt 198.0 lb

## 2015-12-24 DIAGNOSIS — R202 Paresthesia of skin: Secondary | ICD-10-CM | POA: Diagnosis not present

## 2015-12-24 DIAGNOSIS — I10 Essential (primary) hypertension: Secondary | ICD-10-CM

## 2015-12-24 DIAGNOSIS — R200 Anesthesia of skin: Secondary | ICD-10-CM

## 2015-12-24 NOTE — Progress Notes (Signed)
Subjective:    Patient ID: [name], male    DOB: [birth year], 50 y.o.   MRN: [id number]  HPI Patient's blood pressure at home yesterday was 150/120. However this was checked with her home meter. Here today my nurse checked and found to be 118/82. I checked it myself and found to be 124/88. He also complains of an upper respiratory infection with rhinorrhea and head congestion. He denies any fever. His lungs are clear to auscultation bilaterally. He denies any shortness of breath. His wife is concerned because he is been complaining of numbness in his right hand. The numbness is distal to the wrist. It has been present for several weeks. There are no exacerbating or alleviating factors. He denies any weakness in his right hand. He has no weakness in his grip strength. He has a positive Phalen sign. Tinel's sign is equivocal. He has no neck pain. He denies any numbness and tingling in his right arm. Past Medical History  Diagnosis Date  . Allergy   . Hypertension   . GAD (generalized anxiety disorder)   . Diverticulitis   . ETOH abuse   . Colonic diverticular abscess 12/26/2014   Past Surgical History  Procedure Laterality Date  . Rotator cuff repair    . Hernia repair    . Hemorrhoid surgery    . Partial colectomy N/A 01/01/2015    Procedure: PARTIAL COLECTOMY;  Surgeon: Jamesetta So, MD;  Location: [facility];  Service: General;  Laterality: N/A;  . Appendectomy N/A 01/01/2015    Procedure: INCIDENTAL APPENDECTOMY;  Surgeon: Jamesetta So, MD;  Location: [facility];  Service: General;  Laterality: N/A;   Current Outpatient Prescriptions on File Prior to Visit  Medication Sig Dispense Refill  . atenolol (TENORMIN) 50 MG tablet TAKE 1 TABLET BY MOUTH EVERY DAY 90 tablet 0  . clonazePAM (KLONOPIN) 0.5 MG tablet TAKE 1 TABLET BY MOUTH TWICE A DAY AS NEEDED 30 tablet 2  . cyclobenzaprine (FLEXERIL) 10 MG tablet TAKE 1 TABLET BY MOUTH AT BEDTIME AS NEEDED 30 tablet 2  . fluticasone (FLONASE)  50 MCG/ACT nasal spray Place 2 sprays into both nostrils daily. 16 g 6  . losartan (COZAAR) 50 MG tablet Take 1 tablet (50 mg total) by mouth daily. 30 tablet 0  . losartan-hydrochlorothiazide (HYZAAR) 50-12.5 MG per tablet Take 1 tablet by mouth daily. 90 tablet 3  . NASAL SALINE NA Place 1 spray into the nose daily as needed. For nose bleeds    . simvastatin (ZOCOR) 40 MG tablet TAKE 1 TABLET BY MOUTH EVERY DAY 90 tablet 0  . venlafaxine XR (EFFEXOR-XR) 150 MG 24 hr capsule TAKE ONE CAPSULE IN THE MORNING 90 capsule 1   No current facility-administered medications on file prior to visit.   No Known Allergies Social History   Social History  . Marital Status: Divorced    Spouse Name: N/A  . Number of Children: N/A  . Years of Education: N/A   Occupational History  . Not on file.   Social History Main Topics  . Smoking status: Current Every Day Smoker -- 1.00 packs/day    Types: Cigarettes  . Smokeless tobacco: Never Used  . Alcohol Use: 2.4 oz/week    4 Cans of beer, 0 Standard drinks or equivalent per week     Comment: patient states "about 5 or 6 a day" (beers)  . Drug Use: No  . Sexual Activity: Yes   Other Topics Concern  . Not  on file   Social History Narrative      Review of Systems  All other systems reviewed and are negative.      Objective:   Physical Exam  Constitutional: He is oriented to person, place, and time.  Cardiovascular: Normal rate, regular rhythm and normal heart sounds.   Pulmonary/Chest: Effort normal and breath sounds normal. No respiratory distress. He has no wheezes. He has no rales. He exhibits no tenderness.  Abdominal: Soft. Bowel sounds are normal.  Neurological: He is alert and oriented to person, place, and time. He has normal reflexes. He displays normal reflexes. No cranial nerve deficit. He exhibits normal muscle tone. Coordination normal.  Vitals reviewed.         Assessment & Plan:  Benign essential HTN  Numbness and  tingling in right hand  Patient's blood pressure here is excellent. I've asked him to check his blood pressure with a reliable cuff several times over the next week and report the values. As long as his blood pressure is less than 140/90 I would not change his medication. I believe he does have a viral upper respiratory infection and I recommended tincture of time. I see no evidence of a stroke. I see no evidence of cervical radiculopathy. Therefore I believe the numbness in his right hand is likely carpal tunnel syndrome. Using sterile technique, I injected his right wrist with a mixture of 1 mL of lidocaine and 1 mL of 40 mg per mL Kenalog to the lateral/ulnar aspect of the palmaris longus tendon at the distal flexor crease. The patient tolerated procedure well without complication. If symptoms persist, I would recommend a nerve conduction study. If symptoms improve it is likely carpal tunnel syndrome

## 2016-01-06 ENCOUNTER — Other Ambulatory Visit: Payer: Self-pay | Admitting: Family Medicine

## 2016-01-07 NOTE — Telephone Encounter (Signed)
?   OK to Refill  

## 2016-01-07 NOTE — Telephone Encounter (Signed)
ok 

## 2016-01-24 ENCOUNTER — Ambulatory Visit (INDEPENDENT_AMBULATORY_CARE_PROVIDER_SITE_OTHER): Payer: BLUE CROSS/BLUE SHIELD | Admitting: Family Medicine

## 2016-01-24 ENCOUNTER — Encounter: Payer: Self-pay | Admitting: Family Medicine

## 2016-01-24 VITALS — BP 134/90 | HR 64 | Temp 98.0°F | Resp 18 | Wt 194.0 lb

## 2016-01-24 DIAGNOSIS — M5431 Sciatica, right side: Secondary | ICD-10-CM

## 2016-01-24 DIAGNOSIS — G8929 Other chronic pain: Secondary | ICD-10-CM

## 2016-01-24 DIAGNOSIS — R1013 Epigastric pain: Secondary | ICD-10-CM | POA: Diagnosis not present

## 2016-01-24 MED ORDER — OMEPRAZOLE 40 MG PO CPDR: 40.0000 mg | DELAYED_RELEASE_CAPSULE | Freq: Every day | ORAL | Status: DC

## 2016-01-24 NOTE — Progress Notes (Signed)
Subjective:    Patient ID: [name], male    DOB: 11/27/1965, 50 y.o.   MRN: [id number]  HPI  12/21/13 Please see last office visit. At that time the patient came in with vague abdominal pain for one month. Lab work obtained that day revealed a white blood cell count greater than 20. By the next day the patient was in severe pain and was referred to the emergency room where CT scan of the abdomen revealed diverticulitis with microperforation. He was treated conservatively at the hospital with IV antibiotics for 4-5 days and was discharged to complete a total 10 day course of Cipro and Flagyl area and general surgery was consulted and monitor the patient closely but it was deemed that surgical intervention was not necessary as the patient began to dramatically improve on antibiotics. He is now been off antibiotics for over 3 days. He has mild tenderness in his lower abdomen which is much better than it was initially. He attributes this just to the healing process. He denies any fevers or chills or nausea or vomiting or melena or hematochezia. He did have a random blood sugar greater than 300 and the emergency room but subsequent hemoglobin A1c was 5.8. Patient is ready to return to work and would like to return to work on Monday.  At that time, my plan was:  Patient is clinically improved. I would monitor the patient closely for 1 week off antibiotics. If the patient develops worsening pain or fever he will need a CT scan immediately. Otherwise I will have the patient follow-up with gastroenterology for colonoscopy. I explained to the patient we need to at least wait a month prior to the colonoscopy to allow his body to heal. I will defer optimal timing to the gastroenterologist. Patient is cleared to return to work on Monday as long as he continues to improve.  12/26/14 Patient awoke this am with LLQ abd pain, nausea, and lightheadedness.  WBC is 18.  He is tender to palpation in LLQ.  At that  time, my plan was: I will send the patient for a stat CT scan of the abdomen and pelvis with contrast. CT confirms microperforation and diverticulitis he will need to go directly to the emergency room for admission IV antibiotics and surgical consultation.  CT ultimately revealed: 1. Since the CT 4 days ago, new extraluminal gas bubbles adjacent to the distal sigmoid colon in the area of previously identified diverticulitis, consistent with microperforation, with increasing edema/inflammation surrounding the distal sigmoid colon. The previously identified abscess has decreased in size. 2. Interval development of dilation of the proximal appendix, measuring up to approximately 11 mm diameter (6 mm previously). While there is no periappendiceal edema or inflammation currently, very early appendicitis may be present, given the change since 4 days ago. 3. Very small hiatal hernia. 4. Small amount of gallbladder sludge. No evidence of acute Cholecystitis. Ultimately underwent partial colectomy and coincidental appendectomy on 01/01/15. Last 2 weeks, the patient has been getting occasional episodic sharp pain in his left upper quadrant/subxiphoid area. The pain can be sharp and intense. It starts suddenly and last hours. Several days ago he started omeprazole on his own and the symptoms have improved. At the present time he is asymptomatic. He does drink 5 beers a day. He denies any fevers or chills. Past Medical History  Diagnosis Date  . Allergy   . Hypertension   . GAD (generalized anxiety disorder)   . Diverticulitis   .  ETOH abuse   . Colonic diverticular abscess 12/26/2014   Past Surgical History  Procedure Laterality Date  . Rotator cuff repair    . Hernia repair    . Hemorrhoid surgery    . Partial colectomy N/A 01/01/2015    Procedure: PARTIAL COLECTOMY;  Surgeon: Jamesetta So, MD;  Location: [facility];  Service: General;  Laterality: N/A;  . Appendectomy N/A 01/01/2015    Procedure:  INCIDENTAL APPENDECTOMY;  Surgeon: Jamesetta So, MD;  Location: [facility];  Service: General;  Laterality: N/A;   Current Outpatient Prescriptions on File Prior to Visit  Medication Sig Dispense Refill  . atenolol (TENORMIN) 50 MG tablet TAKE 1 TABLET BY MOUTH EVERY DAY 90 tablet 0  . clonazePAM (KLONOPIN) 0.5 MG tablet TAKE 1 TABLET BY MOUTH TWICE A DAY AS NEEDED 30 tablet 2  . cyclobenzaprine (FLEXERIL) 10 MG tablet TAKE 1 TABLET BY MOUTH AT BEDTIME AS NEEDED 30 tablet 2  . fluticasone (FLONASE) 50 MCG/ACT nasal spray Place 2 sprays into both nostrils daily. 16 g 6  . losartan (COZAAR) 50 MG tablet Take 1 tablet (50 mg total) by mouth daily. 30 tablet 0  . losartan-hydrochlorothiazide (HYZAAR) 50-12.5 MG per tablet Take 1 tablet by mouth daily. 90 tablet 3  . NASAL SALINE NA Place 1 spray into the nose daily as needed. For nose bleeds    . simvastatin (ZOCOR) 40 MG tablet TAKE 1 TABLET BY MOUTH EVERY DAY 90 tablet 0  . venlafaxine XR (EFFEXOR-XR) 150 MG 24 hr capsule TAKE ONE CAPSULE IN THE MORNING 90 capsule 1   No current facility-administered medications on file prior to visit.   No Known Allergies Social History   Social History  . Marital Status: Divorced    Spouse Name: N/A  . Number of Children: N/A  . Years of Education: N/A   Occupational History  . Not on file.   Social History Main Topics  . Smoking status: Current Every Day Smoker -- 1.00 packs/day    Types: Cigarettes  . Smokeless tobacco: Never Used  . Alcohol Use: 2.4 oz/week    4 Cans of beer, 0 Standard drinks or equivalent per week     Comment: patient states "about 5 or 6 a day" (beers)  . Drug Use: No  . Sexual Activity: Yes   Other Topics Concern  . Not on file   Social History Narrative     Review of Systems  All other systems reviewed and are negative.      Objective:   Physical Exam  Constitutional: He appears well-developed and well-nourished.  Neck: Neck supple.  Cardiovascular: Normal  rate, regular rhythm and normal heart sounds.   No murmur heard. Pulmonary/Chest: Effort normal and breath sounds normal. No respiratory distress. He has no wheezes. He has no rales.  Abdominal: Soft. Bowel sounds are normal. He exhibits no distension and no mass. There is no tenderness. There is no rebound and no guarding.  Musculoskeletal: He exhibits no edema.  Skin: Skin is warm. No rash noted. No erythema.  Vitals reviewed.         Assessment & Plan:  Abdominal pain, chronic, epigastric - Plan: omeprazole (PRILOSEC) 40 MG capsule, COMPLETE METABOLIC PANEL WITH GFR, Lipase  Patient denies any symptoms of a small bowel obstruction. He has normal bowel movements and normal flatus. Differential diagnosis includes peptic ulcer disease/gastritis versus biliary colic versus pancreatitis. I will evaluate for pancreatitis by checking a lipase. I recommended abstinence from alcohol.  I would continue omeprazole 40 mg a day for the next 2 months to rule out any stomach lining irritation to heal. If the pain persists and the labs are normal, I would recommend surgical consultation given his known cholelithiasis and the nature of his pain..  Patient also like a referral to see neurosurgery at the need for a second opinion regarding his degenerative disc disease and right-sided sciatica in his lower back.

## 2016-01-25 LAB — COMPLETE METABOLIC PANEL WITH GFR
ALT: 33 U/L (ref 9–46)
AST: 27 U/L (ref 10–35)
Albumin: 4 g/dL (ref 3.6–5.1)
Alkaline Phosphatase: 87 U/L (ref 40–115)
BUN: 12 mg/dL (ref 7–25)
CO2: 26 mmol/L (ref 20–31)
Calcium: 9.6 mg/dL (ref 8.6–10.3)
Chloride: 101 mmol/L (ref 98–110)
Creat: 0.88 mg/dL (ref 0.70–1.33)
GFR, Est African American: >89 mL/min (ref >=60)
GLUCOSE: 88 mg/dL (ref 70–99)
POTASSIUM: 4.4 mmol/L (ref 3.5–5.3)
SODIUM: 138 mmol/L (ref 135–146)
Total Bilirubin: 0.3 mg/dL (ref 0.2–1.2)
Total Protein: 6.6 g/dL (ref 6.1–8.1)

## 2016-01-25 LAB — LIPASE: Lipase: 16 U/L (ref 7–60)

## 2016-02-03 ENCOUNTER — Other Ambulatory Visit: Payer: Self-pay | Admitting: Family Medicine

## 2016-02-04 ENCOUNTER — Other Ambulatory Visit: Payer: Self-pay | Admitting: Family Medicine

## 2016-02-04 NOTE — Telephone Encounter (Signed)
Medication refilled per protocol. 

## 2016-02-05 NOTE — Telephone Encounter (Signed)
Medication refilled per protocol. 

## 2016-02-05 NOTE — Telephone Encounter (Signed)
LRF cyclobenzaprine 08/13/15 #30 +2  OK refill?

## 2016-02-05 NOTE — Telephone Encounter (Signed)
ok 

## 2016-02-08 ENCOUNTER — Encounter: Payer: Self-pay | Admitting: Family Medicine

## 2016-02-08 ENCOUNTER — Ambulatory Visit (INDEPENDENT_AMBULATORY_CARE_PROVIDER_SITE_OTHER): Payer: BLUE CROSS/BLUE SHIELD | Admitting: Family Medicine

## 2016-02-08 VITALS — BP 118/80 | HR 82 | Temp 98.3°F | Resp 18 | Ht 68.0 in | Wt 200.0 lb

## 2016-02-08 DIAGNOSIS — H6120 Impacted cerumen, unspecified ear: Secondary | ICD-10-CM | POA: Diagnosis not present

## 2016-02-08 DIAGNOSIS — H918X9 Other specified hearing loss, unspecified ear: Secondary | ICD-10-CM | POA: Diagnosis not present

## 2016-02-08 DIAGNOSIS — H6123 Impacted cerumen, bilateral: Secondary | ICD-10-CM

## 2016-02-08 NOTE — Progress Notes (Signed)
Subjective:    Patient ID: [name], male    DOB: [birth year], 50 y.o.   MRN: [id number]  HPI Patient fell a hearing screen at work. The nurse who performed a hearing screen stated that the patient's ear canals were completely occluded with cerumen impaction bilaterally. He is here today requesting [place] to remove the cerumen impaction. Using irrigation and lavage the cerumen impaction was easily removed on both sides, nurse. There was a trace amount of blood on the inferior surface of the auditory canal in the right ear after removal of the cerumen impaction which I believe is coming from an abrasion from [facility] detached. The tympanic membrane itself shows no evidence of perforation or irritation or infection. The patient is completely asymptomatic and his hearing improved after removal of the cerumen impaction Past Medical History  Diagnosis Date  . Allergy   . Hypertension   . GAD (generalized anxiety disorder)   . Diverticulitis   . ETOH abuse   . Colonic diverticular abscess 12/26/2014   Past Surgical History  Procedure Laterality Date  . Rotator cuff repair    . Hernia repair    . Hemorrhoid surgery    . Partial colectomy N/A 01/01/2015    Procedure: PARTIAL COLECTOMY;  Surgeon: Jamesetta So, MD;  Location: [facility];  Service: General;  Laterality: N/A;  . Appendectomy N/A 01/01/2015    Procedure: INCIDENTAL APPENDECTOMY;  Surgeon: Jamesetta So, MD;  Location: [facility];  Service: General;  Laterality: N/A;   Current Outpatient Prescriptions on File Prior to Visit  Medication Sig Dispense Refill  . atenolol (TENORMIN) 50 MG tablet TAKE 1 TABLET BY MOUTH EVERY DAY 90 tablet 1  . clonazePAM (KLONOPIN) 0.5 MG tablet TAKE 1 TABLET BY MOUTH TWICE A DAY AS NEEDED 30 tablet 2  . cyclobenzaprine (FLEXERIL) 10 MG tablet TAKE 1 TABLET BY MOUTH AT BEDTIME AS NEEDED 30 tablet 2  . fluticasone (FLONASE) 50 MCG/ACT nasal spray Place 2 sprays into both nostrils daily. 16 g 6  .  losartan-hydrochlorothiazide (HYZAAR) 50-12.5 MG tablet TAKE 1 TABLET BY MOUTH DAILY. 90 tablet 3  . NASAL SALINE NA Place 1 spray into the nose daily as needed. For nose bleeds    . omeprazole (PRILOSEC) 40 MG capsule Take 1 capsule (40 mg total) by mouth daily. 30 capsule 3  . simvastatin (ZOCOR) 40 MG tablet TAKE 1 TABLET BY MOUTH EVERY DAY 90 tablet 3  . venlafaxine XR (EFFEXOR-XR) 150 MG 24 hr capsule TAKE ONE CAPSULE IN THE MORNING 90 capsule 1   No current facility-administered medications on file prior to visit.   No Known Allergies Social History   Social History  . Marital Status: Divorced    Spouse Name: N/A  . Number of Children: N/A  . Years of Education: N/A   Occupational History  . Not on file.   Social History Main Topics  . Smoking status: Current Every Day Smoker -- 1.00 packs/day    Types: Cigarettes  . Smokeless tobacco: Never Used  . Alcohol Use: 2.4 oz/week    4 Cans of beer, 0 Standard drinks or equivalent per week     Comment: patient states "about 5 or 6 a day" (beers)  . Drug Use: No  . Sexual Activity: Yes   Other Topics Concern  . Not on file   Social History Narrative      Review of Systems  All other systems reviewed and are negative.  Objective:   Physical Exam  Cardiovascular: Normal rate and regular rhythm.   Pulmonary/Chest: Effort normal and breath sounds normal.  Vitals reviewed.  please see the description in the history of present illness        Assessment & Plan:  Bilateral hearing loss due to cerumen impaction  Bilateral cerumen impactions were easily removed on both sides using irrigation and lavage. There was a trace amount of blood on the inferior surface of the right external auditory canal from a small abrasion from [place] detached. No treatment was necessary. Follow-up as needed

## 2016-02-13 ENCOUNTER — Other Ambulatory Visit: Payer: Self-pay | Admitting: Family Medicine

## 2016-02-13 DIAGNOSIS — M5431 Sciatica, right side: Secondary | ICD-10-CM

## 2016-02-19 ENCOUNTER — Other Ambulatory Visit: Payer: Self-pay | Admitting: Family Medicine

## 2016-02-19 DIAGNOSIS — M5431 Sciatica, right side: Secondary | ICD-10-CM

## 2016-02-25 ENCOUNTER — Ambulatory Visit (HOSPITAL_COMMUNITY)
Admission: RE | Admit: 2016-02-25 | Discharge: 2016-02-25 | Disposition: A | Discharge status: Home / Self Care | Payer: BLUE CROSS/BLUE SHIELD | Source: Ambulatory Visit | Attending: Family Medicine | Admitting: Family Medicine

## 2016-02-25 DIAGNOSIS — M545 Low back pain: Secondary | ICD-10-CM | POA: Diagnosis present

## 2016-02-25 DIAGNOSIS — M1288 Other specific arthropathies, not elsewhere classified, other specified site: Secondary | ICD-10-CM | POA: Diagnosis not present

## 2016-02-25 DIAGNOSIS — M5126 Other intervertebral disc displacement, lumbar region: Secondary | ICD-10-CM | POA: Insufficient documentation

## 2016-02-25 DIAGNOSIS — M5431 Sciatica, right side: Secondary | ICD-10-CM | POA: Diagnosis not present

## 2016-02-25 DIAGNOSIS — M4806 Spinal stenosis, lumbar region: Secondary | ICD-10-CM | POA: Insufficient documentation

## 2016-02-25 DIAGNOSIS — M5127 Other intervertebral disc displacement, lumbosacral region: Secondary | ICD-10-CM | POA: Insufficient documentation

## 2016-02-26 ENCOUNTER — Other Ambulatory Visit: Payer: Self-pay | Admitting: Family Medicine

## 2016-02-26 DIAGNOSIS — M549 Dorsalgia, unspecified: Principal | ICD-10-CM

## 2016-02-26 DIAGNOSIS — G8929 Other chronic pain: Secondary | ICD-10-CM

## 2016-03-27 ENCOUNTER — Other Ambulatory Visit: Payer: Self-pay | Admitting: *Deleted

## 2016-03-27 DIAGNOSIS — G8929 Other chronic pain: Secondary | ICD-10-CM

## 2016-03-27 DIAGNOSIS — R1013 Epigastric pain: Principal | ICD-10-CM

## 2016-03-27 MED ORDER — OMEPRAZOLE 40 MG PO CPDR: 40.0000 mg | DELAYED_RELEASE_CAPSULE | Freq: Every day | ORAL | Status: DC

## 2016-03-27 NOTE — Telephone Encounter (Signed)
Received fax requesting refill on Omeprazole.   Refill appropriate and filled per protocol. 

## 2016-04-01 ENCOUNTER — Telehealth: Payer: Self-pay | Admitting: Family Medicine

## 2016-04-01 NOTE — Telephone Encounter (Signed)
Pt calling to check on referral to ortho. Looks like it was faxed 02/26/16 but he has not heard anything back from them. Please call pt at [phone] and ok to leave message on voice mail with any information.

## 2016-04-02 NOTE — Telephone Encounter (Signed)
Call placed to [facility].   Was advised that Dr. Nelva Bush has agreed to see paitent and [name] will contact to schedule appointment.   Call placed to patient and patient made aware.

## 2016-04-08 ENCOUNTER — Ambulatory Visit (INDEPENDENT_AMBULATORY_CARE_PROVIDER_SITE_OTHER): Payer: BLUE CROSS/BLUE SHIELD | Admitting: Family Medicine

## 2016-04-08 ENCOUNTER — Encounter: Payer: Self-pay | Admitting: Family Medicine

## 2016-04-08 VITALS — BP 106/70 | HR 84 | Temp 98.1°F | Resp 18 | Wt 197.0 lb

## 2016-04-08 DIAGNOSIS — R42 Dizziness and giddiness: Secondary | ICD-10-CM

## 2016-04-08 DIAGNOSIS — R202 Paresthesia of skin: Secondary | ICD-10-CM

## 2016-04-08 DIAGNOSIS — M545 Low back pain, unspecified: Secondary | ICD-10-CM

## 2016-04-08 DIAGNOSIS — R200 Anesthesia of skin: Secondary | ICD-10-CM

## 2016-04-08 LAB — CBC WITH DIFFERENTIAL/PLATELET
BASOS PCT: 0 %
Basophils Absolute: 0 cells/uL (ref 0–200)
EOS ABS: 378 {cells}/uL (ref 15–500)
EOS PCT: 3 %
HCT: 46.8 % (ref 38.5–50.0)
Hemoglobin: 16 g/dL (ref 13.0–17.0)
LYMPHS PCT: 21 %
Lymphs Abs: 2646 cells/uL (ref 850–3900)
MCH: 31.9 pg (ref 27.0–33.0)
MCHC: 34.2 g/dL (ref 32.0–36.0)
MCV: 93.4 fL (ref 80.0–100.0)
MONOS PCT: 16 %
MPV: 10.3 fL (ref 7.5–12.5)
Monocytes Absolute: 2016 cells/uL — ABNORMAL HIGH (ref 200–950)
NEUTROS ABS: 7560 {cells}/uL (ref 1500–7800)
Neutrophils Relative %: 60 %
PLATELETS: 319 10*3/uL (ref 140–400)
RBC: 5.01 MIL/uL (ref 4.20–5.80)
RDW: 13.5 % (ref 11.0–15.0)
WBC: 12.6 10*3/uL — AB (ref 3.8–10.8)

## 2016-04-08 NOTE — Progress Notes (Signed)
Subjective:    Patient ID: [name], male    DOB: 1966-10-06, 50 y.o.   MRN: [id number]  HPI Recently had MRI of L spine for low back pain.  Results are listed below: 1. At L5-S1 there is a mild broad-based disc bulge with a small central disc protrusion. Mild bilateral facet arthropathy. Mild bilateral foraminal stenosis. 2. At L4-5 there is a mild broad-based disc bulge eccentric towards the left. Mild bilateral facet arthropathy. Continues to have midline low back pain without sciatica but now pain seems to have centered at the SI joints.    Also continues to have numbness and tingling in right arm.  I thought at first CTS and performed cortisone injection which helped temporarily but symptoms returned and have now worsened.  Now also radiate up to the elbow.  Also reports orthostatic dizziness and lightheadedness with position changes.  Denies syncope.   Past Medical History  Diagnosis Date  . Allergy   . Hypertension   . GAD (generalized anxiety disorder)   . Diverticulitis   . ETOH abuse   . Colonic diverticular abscess 12/26/2014   Past Surgical History  Procedure Laterality Date  . Rotator cuff repair    . Hernia repair    . Hemorrhoid surgery    . Partial colectomy N/A 01/01/2015    Procedure: PARTIAL COLECTOMY;  Surgeon: Jamesetta So, MD;  Location: [facility];  Service: General;  Laterality: N/A;  . Appendectomy N/A 01/01/2015    Procedure: INCIDENTAL APPENDECTOMY;  Surgeon: Jamesetta So, MD;  Location: [facility];  Service: General;  Laterality: N/A;   Current Outpatient Prescriptions on File Prior to Visit  Medication Sig Dispense Refill  . atenolol (TENORMIN) 50 MG tablet TAKE 1 TABLET BY MOUTH EVERY DAY 90 tablet 1  . clonazePAM (KLONOPIN) 0.5 MG tablet TAKE 1 TABLET BY MOUTH TWICE A DAY AS NEEDED 30 tablet 2  . cyclobenzaprine (FLEXERIL) 10 MG tablet TAKE 1 TABLET BY MOUTH AT BEDTIME AS NEEDED 30 tablet 2  . fluticasone (FLONASE) 50 MCG/ACT nasal spray Place  2 sprays into both nostrils daily. 16 g 6  . losartan-hydrochlorothiazide (HYZAAR) 50-12.5 MG tablet TAKE 1 TABLET BY MOUTH DAILY. 90 tablet 3  . NASAL SALINE NA Place 1 spray into the nose daily as needed. For nose bleeds    . omeprazole (PRILOSEC) 40 MG capsule Take 1 capsule (40 mg total) by mouth daily. 90 capsule 3  . simvastatin (ZOCOR) 40 MG tablet TAKE 1 TABLET BY MOUTH EVERY DAY 90 tablet 3  . venlafaxine XR (EFFEXOR-XR) 150 MG 24 hr capsule TAKE ONE CAPSULE IN THE MORNING 90 capsule 1   No current facility-administered medications on file prior to visit.   No Known Allergies Social History   Social History  . Marital Status: Divorced    Spouse Name: N/A  . Number of Children: N/A  . Years of Education: N/A   Occupational History  . Not on file.   Social History Main Topics  . Smoking status: Current Every Day Smoker -- 1.00 packs/day    Types: Cigarettes  . Smokeless tobacco: Never Used  . Alcohol Use: 2.4 oz/week    4 Cans of beer, 0 Standard drinks or equivalent per week     Comment: patient states "about 5 or 6 a day" (beers)  . Drug Use: No  . Sexual Activity: Yes   Other Topics Concern  . Not on file   Social History Narrative  Review of Systems  All other systems reviewed and are negative.      Objective:   Physical Exam  Cardiovascular: Normal rate, regular rhythm and normal heart sounds.   Pulmonary/Chest: Effort normal and breath sounds normal. No respiratory distress. He has no wheezes. He has no rales.  Abdominal: Soft. Bowel sounds are normal.  Musculoskeletal:       Lumbar back: He exhibits decreased range of motion, tenderness and bony tenderness. He exhibits no deformity, no pain, no spasm and normal pulse.       Right hand: Decreased sensation noted. Decreased sensation is present in the medial distribution.  Vitals reviewed.         Assessment & Plan:  Dizziness, nonspecific - Plan: CBC with Differential/Platelet, COMPLETE  METABOLIC PANEL WITH GFR  Right arm numbness - Plan: NCV with EMG(electromyography)  Bilateral low back pain without sciatica - Plan: AMB referral to orthopedics  Consult ortho for SI joint injections  Schedule NCS to determine if cervical radiculopathy vs CTS to determine the next best step in treatment.  Appears to have orthostatic dizziness.  DC hyzaar.  Check cbc and cmp and recheck in 2 weeks.

## 2016-04-09 ENCOUNTER — Other Ambulatory Visit: Payer: Self-pay | Admitting: Family Medicine

## 2016-04-09 LAB — COMPLETE METABOLIC PANEL WITH GFR
ALT: 30 U/L (ref 9–46)
AST: 30 U/L (ref 10–35)
Albumin: 4.3 g/dL (ref 3.6–5.1)
Alkaline Phosphatase: 91 U/L (ref 40–115)
BUN: 15 mg/dL (ref 7–25)
CHLORIDE: 96 mmol/L — AB (ref 98–110)
CO2: 27 mmol/L (ref 20–31)
Calcium: 9.3 mg/dL (ref 8.6–10.3)
Creat: 1.03 mg/dL (ref 0.70–1.33)
GFR, EST NON AFRICAN AMERICAN: 84 mL/min (ref >=60)
GLUCOSE: 36 mg/dL — AB (ref 70–99)
Potassium: 3.7 mmol/L (ref 3.5–5.3)
SODIUM: 137 mmol/L (ref 135–146)
TOTAL PROTEIN: 6.7 g/dL (ref 6.1–8.1)
Total Bilirubin: 0.4 mg/dL (ref 0.2–1.2)

## 2016-04-10 NOTE — Telephone Encounter (Signed)
Ok to refill??  Last office visit 04/08/2016.  Last refill 01/08/2016, #2 refills.

## 2016-04-10 NOTE — Telephone Encounter (Signed)
Medication called to pharmacy. 

## 2016-04-10 NOTE — Telephone Encounter (Signed)
ok 

## 2016-04-14 ENCOUNTER — Encounter (HOSPITAL_COMMUNITY)
Admission: EM | Disposition: A | Discharge status: Home / Self Care | Payer: Self-pay | Source: Home / Self Care | Attending: General Surgery

## 2016-04-14 ENCOUNTER — Emergency Department (HOSPITAL_COMMUNITY): Payer: BLUE CROSS/BLUE SHIELD

## 2016-04-14 ENCOUNTER — Inpatient Hospital Stay (HOSPITAL_COMMUNITY)
Admission: EM | Admit: 2016-04-14 | Discharge: 2016-04-19 | DRG: 328 | Disposition: A | Discharge status: Home / Self Care | Payer: BLUE CROSS/BLUE SHIELD | Attending: General Surgery | Admitting: General Surgery

## 2016-04-14 ENCOUNTER — Emergency Department (HOSPITAL_COMMUNITY): Payer: BLUE CROSS/BLUE SHIELD | Admitting: Anesthesiology

## 2016-04-14 ENCOUNTER — Encounter (HOSPITAL_COMMUNITY): Payer: Self-pay | Admitting: *Deleted

## 2016-04-14 DIAGNOSIS — R198 Other specified symptoms and signs involving the digestive system and abdomen: Secondary | ICD-10-CM

## 2016-04-14 DIAGNOSIS — R059 Cough, unspecified: Secondary | ICD-10-CM

## 2016-04-14 DIAGNOSIS — R1031 Right lower quadrant pain: Secondary | ICD-10-CM | POA: Diagnosis present

## 2016-04-14 DIAGNOSIS — F419 Anxiety disorder, unspecified: Secondary | ICD-10-CM | POA: Diagnosis present

## 2016-04-14 DIAGNOSIS — F1010 Alcohol abuse, uncomplicated: Secondary | ICD-10-CM | POA: Diagnosis present

## 2016-04-14 DIAGNOSIS — D72829 Elevated white blood cell count, unspecified: Secondary | ICD-10-CM | POA: Diagnosis present

## 2016-04-14 DIAGNOSIS — F411 Generalized anxiety disorder: Secondary | ICD-10-CM | POA: Diagnosis present

## 2016-04-14 DIAGNOSIS — R05 Cough: Secondary | ICD-10-CM

## 2016-04-14 DIAGNOSIS — F17210 Nicotine dependence, cigarettes, uncomplicated: Secondary | ICD-10-CM | POA: Diagnosis present

## 2016-04-14 DIAGNOSIS — I10 Essential (primary) hypertension: Secondary | ICD-10-CM | POA: Diagnosis present

## 2016-04-14 DIAGNOSIS — K251 Acute gastric ulcer with perforation: Secondary | ICD-10-CM | POA: Diagnosis present

## 2016-04-14 DIAGNOSIS — E876 Hypokalemia: Secondary | ICD-10-CM | POA: Diagnosis not present

## 2016-04-14 DIAGNOSIS — Z9049 Acquired absence of other specified parts of digestive tract: Secondary | ICD-10-CM

## 2016-04-14 DIAGNOSIS — K271 Acute peptic ulcer, site unspecified, with perforation: Secondary | ICD-10-CM | POA: Diagnosis present

## 2016-04-14 DIAGNOSIS — R079 Chest pain, unspecified: Secondary | ICD-10-CM

## 2016-04-14 HISTORY — DX: Gastro-esophageal reflux disease without esophagitis: K21.9

## 2016-04-14 HISTORY — PX: GASTRECTOMY: SHX58

## 2016-04-14 LAB — COMPREHENSIVE METABOLIC PANEL
ALBUMIN: 3.6 g/dL (ref 3.5–5.0)
ALK PHOS: 82 U/L (ref 38–126)
ALT: 25 U/L (ref 17–63)
ANION GAP: 6 (ref 5–15)
AST: 24 U/L (ref 15–41)
BILIRUBIN TOTAL: 0.2 mg/dL — AB (ref 0.3–1.2)
BUN: 16 mg/dL (ref 6–20)
CALCIUM: 9.1 mg/dL (ref 8.9–10.3)
CO2: 26 mmol/L (ref 22–32)
Chloride: 107 mmol/L (ref 101–111)
Creatinine, Ser: 0.79 mg/dL (ref 0.61–1.24)
Glucose, Bld: 116 mg/dL — ABNORMAL HIGH (ref 65–99)
POTASSIUM: 4.4 mmol/L (ref 3.5–5.1)
Sodium: 139 mmol/L (ref 135–145)
TOTAL PROTEIN: 6.4 g/dL — AB (ref 6.5–8.1)

## 2016-04-14 LAB — TROPONIN I

## 2016-04-14 LAB — CBC WITH DIFFERENTIAL/PLATELET
BASOS ABS: 0.1 10*3/uL (ref 0.0–0.1)
BASOS PCT: 1 %
EOS ABS: 0.3 10*3/uL (ref 0.0–0.7)
Eosinophils Relative: 3 %
HEMATOCRIT: 47 % (ref 39.0–52.0)
HEMOGLOBIN: 15.6 g/dL (ref 13.0–17.0)
Lymphocytes Relative: 22 %
Lymphs Abs: 2 10*3/uL (ref 0.7–4.0)
MCH: 32.2 pg (ref 26.0–34.0)
MCHC: 33.2 g/dL (ref 30.0–36.0)
MCV: 96.9 fL (ref 78.0–100.0)
Monocytes Absolute: 0.8 10*3/uL (ref 0.1–1.0)
Monocytes Relative: 8 %
NEUTROS ABS: 6.2 10*3/uL (ref 1.7–7.7)
NEUTROS PCT: 66 %
Platelets: 290 10*3/uL (ref 150–400)
RBC: 4.85 MIL/uL (ref 4.22–5.81)
RDW: 13.1 % (ref 11.5–15.5)
WBC: 9.3 10*3/uL (ref 4.0–10.5)

## 2016-04-14 LAB — LIPASE, BLOOD: LIPASE: 43 U/L (ref 11–51)

## 2016-04-14 SURGERY — GASTRECTOMY, TOTAL
Anesthesia: General

## 2016-04-14 MED ORDER — NEOSTIGMINE METHYLSULFATE 10 MG/10ML IV SOLN
INTRAVENOUS | Status: DC | PRN
  Administered 2016-04-14 (×2): 2 mg via INTRAVENOUS

## 2016-04-14 MED ORDER — ONDANSETRON 4 MG PO TBDP: 4.0000 mg | ORAL_TABLET | Freq: Four times a day (QID) | ORAL | Status: DC | PRN

## 2016-04-14 MED ORDER — LIDOCAINE HCL (CARDIAC) 10 MG/ML IV SOLN
INTRAVENOUS | Status: DC | PRN
  Administered 2016-04-14: 50 mg via INTRAVENOUS

## 2016-04-14 MED ORDER — ADULT MULTIVITAMIN W/MINERALS CH
1.0000 | ORAL_TABLET | Freq: Every day | ORAL | Status: DC
  Administered 2016-04-17 – 2016-04-19 (×3): 1 via ORAL
  Filled 2016-04-14 (×5): qty 1

## 2016-04-14 MED ORDER — HYDROMORPHONE HCL 1 MG/ML IJ SOLN
1.0000 mg | INTRAMUSCULAR | Status: DC | PRN
  Administered 2016-04-14 – 2016-04-15 (×6): 1 mg via INTRAVENOUS
  Filled 2016-04-14 (×6): qty 1

## 2016-04-14 MED ORDER — KETOROLAC TROMETHAMINE 30 MG/ML IJ SOLN
30.0000 mg | Freq: Once | INTRAMUSCULAR | Status: AC
  Administered 2016-04-14: 30 mg via INTRAVENOUS

## 2016-04-14 MED ORDER — LORAZEPAM 2 MG/ML IJ SOLN
0.0000 mg | Freq: Two times a day (BID) | INTRAMUSCULAR | Status: DC
  Administered 2016-04-16: 1 mg via INTRAVENOUS
  Filled 2016-04-14 (×2): qty 1

## 2016-04-14 MED ORDER — LORAZEPAM 2 MG/ML IJ SOLN
1.0000 mg | Freq: Four times a day (QID) | INTRAMUSCULAR | Status: DC | PRN
  Administered 2016-04-16 – 2016-04-17 (×2): 1 mg via INTRAVENOUS
  Filled 2016-04-14: qty 1

## 2016-04-14 MED ORDER — ROCURONIUM BROMIDE 50 MG/5ML IV SOLN
INTRAVENOUS | Status: AC
  Filled 2016-04-14: qty 1

## 2016-04-14 MED ORDER — CEFAZOLIN SODIUM-DEXTROSE 2-4 GM/100ML-% IV SOLN
INTRAVENOUS | Status: AC
  Filled 2016-04-14: qty 100

## 2016-04-14 MED ORDER — FENTANYL CITRATE (PF) 250 MCG/5ML IJ SOLN
INTRAMUSCULAR | Status: AC
  Filled 2016-04-14: qty 5

## 2016-04-14 MED ORDER — LACTATED RINGERS IV SOLN
INTRAVENOUS | Status: DC
  Administered 2016-04-14 – 2016-04-16 (×2): via INTRAVENOUS

## 2016-04-14 MED ORDER — KETOROLAC TROMETHAMINE 30 MG/ML IJ SOLN
INTRAMUSCULAR | Status: AC
  Filled 2016-04-14: qty 1

## 2016-04-14 MED ORDER — BUPIVACAINE LIPOSOME 1.3 % IJ SUSP
INTRAMUSCULAR | Status: DC | PRN
  Administered 2016-04-14: 20 mL

## 2016-04-14 MED ORDER — LORAZEPAM 2 MG/ML IJ SOLN
0.0000 mg | Freq: Four times a day (QID) | INTRAMUSCULAR | Status: AC
  Administered 2016-04-14 – 2016-04-15 (×2): 2 mg via INTRAVENOUS
  Administered 2016-04-15 (×2): 1 mg via INTRAVENOUS
  Administered 2016-04-16: 2 mg via INTRAVENOUS
  Filled 2016-04-14 (×5): qty 1

## 2016-04-14 MED ORDER — MIDAZOLAM HCL 2 MG/2ML IJ SOLN
1.0000 mg | INTRAMUSCULAR | Status: DC | PRN
  Administered 2016-04-14: 2 mg via INTRAVENOUS

## 2016-04-14 MED ORDER — FENTANYL CITRATE (PF) 100 MCG/2ML IJ SOLN
100.0000 ug | Freq: Once | INTRAMUSCULAR | Status: AC
  Administered 2016-04-14: 100 ug via INTRAVENOUS

## 2016-04-14 MED ORDER — HYDROMORPHONE HCL 1 MG/ML IJ SOLN
1.0000 mg | INTRAMUSCULAR | Status: DC | PRN
  Administered 2016-04-14: 1 mg via INTRAVENOUS
  Filled 2016-04-14: qty 1

## 2016-04-14 MED ORDER — LIDOCAINE HCL (PF) 1 % IJ SOLN
INTRAMUSCULAR | Status: AC
  Filled 2016-04-14: qty 5

## 2016-04-14 MED ORDER — LORAZEPAM 1 MG PO TABS: 1.0000 mg | ORAL_TABLET | Freq: Four times a day (QID) | ORAL | Status: DC | PRN

## 2016-04-14 MED ORDER — ONDANSETRON HCL 4 MG/2ML IJ SOLN
4.0000 mg | Freq: Once | INTRAMUSCULAR | Status: AC
  Administered 2016-04-14: 4 mg via INTRAVENOUS
  Filled 2016-04-14: qty 2

## 2016-04-14 MED ORDER — FENTANYL CITRATE (PF) 100 MCG/2ML IJ SOLN
INTRAMUSCULAR | Status: AC
  Filled 2016-04-14: qty 2

## 2016-04-14 MED ORDER — NICOTINE 21 MG/24HR TD PT24
21.0000 mg | MEDICATED_PATCH | Freq: Every day | TRANSDERMAL | Status: DC
Start: 2016-04-14 — End: 2016-04-19
  Administered 2016-04-15 – 2016-04-19 (×5): 21 mg via TRANSDERMAL
  Filled 2016-04-14 (×5): qty 1

## 2016-04-14 MED ORDER — ROCURONIUM 10MG/ML (10ML) SYRINGE FOR MEDFUSION PUMP - OPTIME
INTRAVENOUS | Status: DC | PRN
  Administered 2016-04-14: 5 mg via INTRAVENOUS
  Administered 2016-04-14: 25 mg via INTRAVENOUS
  Administered 2016-04-14 (×2): 5 mg via INTRAVENOUS

## 2016-04-14 MED ORDER — SODIUM CHLORIDE 0.9 % IV BOLUS (SEPSIS)
1000.0000 mL | Freq: Once | INTRAVENOUS | Status: AC
  Administered 2016-04-14: 1000 mL via INTRAVENOUS

## 2016-04-14 MED ORDER — BUPIVACAINE LIPOSOME 1.3 % IJ SUSP
INTRAMUSCULAR | Status: AC
  Filled 2016-04-14: qty 20

## 2016-04-14 MED ORDER — SODIUM CHLORIDE 0.9 % IV SOLN
Freq: Once | INTRAVENOUS | Status: AC
  Administered 2016-04-14: 11:00:00 via INTRAVENOUS

## 2016-04-14 MED ORDER — ONDANSETRON HCL 4 MG/2ML IJ SOLN
INTRAMUSCULAR | Status: AC
  Filled 2016-04-14: qty 2

## 2016-04-14 MED ORDER — ACETAMINOPHEN 325 MG PO TABS
650.0000 mg | ORAL_TABLET | Freq: Four times a day (QID) | ORAL | Status: DC | PRN
  Administered 2016-04-18: 650 mg via ORAL
  Filled 2016-04-14 (×2): qty 2

## 2016-04-14 MED ORDER — ACETAMINOPHEN 650 MG RE SUPP
650.0000 mg | Freq: Four times a day (QID) | RECTAL | Status: DC | PRN
  Administered 2016-04-17: 650 mg via RECTAL
  Filled 2016-04-14: qty 1

## 2016-04-14 MED ORDER — PIPERACILLIN-TAZOBACTAM 3.375 G IVPB
3.3750 g | Freq: Three times a day (TID) | INTRAVENOUS | Status: DC
  Administered 2016-04-14 – 2016-04-19 (×15): 3.375 g via INTRAVENOUS
  Filled 2016-04-14 (×13): qty 50

## 2016-04-14 MED ORDER — PANTOPRAZOLE SODIUM 40 MG IV SOLR
40.0000 mg | Freq: Once | INTRAVENOUS | Status: AC
  Administered 2016-04-14: 40 mg via INTRAVENOUS
  Filled 2016-04-14: qty 40

## 2016-04-14 MED ORDER — GLYCOPYRROLATE 0.2 MG/ML IJ SOLN
INTRAMUSCULAR | Status: AC
  Filled 2016-04-14: qty 3

## 2016-04-14 MED ORDER — POVIDONE-IODINE 10 % OINT PACKET
TOPICAL_OINTMENT | CUTANEOUS | Status: DC | PRN
  Administered 2016-04-14: 1 via TOPICAL

## 2016-04-14 MED ORDER — SUCCINYLCHOLINE CHLORIDE 20 MG/ML IJ SOLN
INTRAMUSCULAR | Status: DC | PRN
  Administered 2016-04-14: 180 mg via INTRAVENOUS

## 2016-04-14 MED ORDER — ONDANSETRON HCL 4 MG/2ML IJ SOLN
4.0000 mg | Freq: Once | INTRAMUSCULAR | Status: AC | PRN
  Administered 2016-04-14: 4 mg via INTRAVENOUS

## 2016-04-14 MED ORDER — CEFAZOLIN SODIUM-DEXTROSE 2-4 GM/100ML-% IV SOLN
2.0000 g | INTRAVENOUS | Status: AC
  Administered 2016-04-14: 2 g via INTRAVENOUS

## 2016-04-14 MED ORDER — GLYCOPYRROLATE 0.2 MG/ML IJ SOLN
INTRAMUSCULAR | Status: DC | PRN
  Administered 2016-04-14 (×2): 0.3 mg via INTRAVENOUS

## 2016-04-14 MED ORDER — VITAMIN B-1 100 MG PO TABS
100.0000 mg | ORAL_TABLET | Freq: Every day | ORAL | Status: DC
  Administered 2016-04-17 – 2016-04-19 (×3): 100 mg via ORAL
  Filled 2016-04-14 (×4): qty 1

## 2016-04-14 MED ORDER — PROPOFOL 10 MG/ML IV BOLUS
INTRAVENOUS | Status: AC
Start: 2016-04-14 — End: 2016-04-14
  Filled 2016-04-14: qty 20

## 2016-04-14 MED ORDER — FENTANYL CITRATE (PF) 100 MCG/2ML IJ SOLN
50.0000 ug | INTRAMUSCULAR | Status: DC | PRN
  Administered 2016-04-14 (×2): 50 ug via INTRAVENOUS
  Administered 2016-04-14: 100 ug via INTRAVENOUS
  Administered 2016-04-14 (×2): 50 ug via INTRAVENOUS
  Filled 2016-04-14: qty 2

## 2016-04-14 MED ORDER — FOLIC ACID 1 MG PO TABS
1.0000 mg | ORAL_TABLET | Freq: Every day | ORAL | Status: DC
  Administered 2016-04-17 – 2016-04-19 (×3): 1 mg via ORAL
  Filled 2016-04-14 (×5): qty 1

## 2016-04-14 MED ORDER — THIAMINE HCL 100 MG/ML IJ SOLN
100.0000 mg | Freq: Every day | INTRAMUSCULAR | Status: DC
  Administered 2016-04-15 – 2016-04-16 (×2): 100 mg via INTRAVENOUS
  Filled 2016-04-14 (×3): qty 2

## 2016-04-14 MED ORDER — MORPHINE SULFATE (PF) 4 MG/ML IV SOLN
4.0000 mg | Freq: Once | INTRAVENOUS | Status: DC
Start: 2016-04-14 — End: 2016-04-14

## 2016-04-14 MED ORDER — PANTOPRAZOLE SODIUM 40 MG IV SOLR
40.0000 mg | Freq: Two times a day (BID) | INTRAVENOUS | Status: DC
  Administered 2016-04-14 – 2016-04-19 (×10): 40 mg via INTRAVENOUS
  Filled 2016-04-14 (×10): qty 40

## 2016-04-14 MED ORDER — MIDAZOLAM HCL 2 MG/2ML IJ SOLN
INTRAMUSCULAR | Status: AC
  Filled 2016-04-14: qty 2

## 2016-04-14 MED ORDER — LACTATED RINGERS IV SOLN
INTRAVENOUS | Status: DC
  Administered 2016-04-14 (×5): via INTRAVENOUS

## 2016-04-14 MED ORDER — PIPERACILLIN-TAZOBACTAM 3.375 G IVPB 30 MIN
3.3750 g | Freq: Once | INTRAVENOUS | Status: AC
  Administered 2016-04-14: 3.375 g via INTRAVENOUS
  Filled 2016-04-14: qty 50

## 2016-04-14 MED ORDER — HYDROMORPHONE HCL 1 MG/ML IJ SOLN
1.0000 mg | Freq: Once | INTRAMUSCULAR | Status: AC
  Administered 2016-04-14: 1 mg via INTRAVENOUS
  Filled 2016-04-14: qty 1

## 2016-04-14 MED ORDER — ENOXAPARIN SODIUM 40 MG/0.4ML ~~LOC~~ SOLN
40.0000 mg | SUBCUTANEOUS | Status: DC
  Administered 2016-04-15 – 2016-04-19 (×5): 40 mg via SUBCUTANEOUS
  Filled 2016-04-14 (×5): qty 0.4

## 2016-04-14 MED ORDER — FENTANYL CITRATE (PF) 100 MCG/2ML IJ SOLN
25.0000 ug | INTRAMUSCULAR | Status: DC | PRN
  Administered 2016-04-14: 50 ug via INTRAVENOUS
  Filled 2016-04-14: qty 2

## 2016-04-14 MED ORDER — ONDANSETRON HCL 4 MG/2ML IJ SOLN: 4.0000 mg | Freq: Once | INTRAMUSCULAR | Status: DC | PRN

## 2016-04-14 MED ORDER — POVIDONE-IODINE 10 % EX OINT
TOPICAL_OINTMENT | CUTANEOUS | Status: AC
  Filled 2016-04-14: qty 1

## 2016-04-14 MED ORDER — 0.9 % SODIUM CHLORIDE (POUR BTL) OPTIME
TOPICAL | Status: DC | PRN
  Administered 2016-04-14: 1000 mL
  Administered 2016-04-14: 2000 mL

## 2016-04-14 MED ORDER — ONDANSETRON HCL 4 MG/2ML IJ SOLN: 4.0000 mg | Freq: Four times a day (QID) | INTRAMUSCULAR | Status: DC | PRN

## 2016-04-14 MED ORDER — LORAZEPAM 2 MG/ML IJ SOLN
1.0000 mg | INTRAMUSCULAR | Status: DC | PRN
  Administered 2016-04-14: 1 mg via INTRAVENOUS
  Filled 2016-04-14: qty 1

## 2016-04-14 SURGICAL SUPPLY — 37 items
BAG HAMPER (MISCELLANEOUS) ×3 IMPLANT
CELLS DAT CNTRL 66122 CELL SVR (MISCELLANEOUS) ×1 IMPLANT
CLOTH BEACON ORANGE TIMEOUT ST (SAFETY) ×3 IMPLANT
COVER LIGHT HANDLE STERIS (MISCELLANEOUS) ×6 IMPLANT
DRAPE WARM FLUID 44X44 (DRAPE) ×3 IMPLANT
DRSG OPSITE POSTOP 4X8 (GAUZE/BANDAGES/DRESSINGS) ×3 IMPLANT
ELECT REM PT RETURN 9FT ADLT (ELECTROSURGICAL) ×3
ELECTRODE REM PT RTRN 9FT ADLT (ELECTROSURGICAL) ×1 IMPLANT
EVACUATOR DRAINAGE 10X20 100CC (DRAIN) ×1 IMPLANT
EVACUATOR SILICONE 100CC (DRAIN) ×2
GLOVE BIOGEL M 7.0 STRL (GLOVE) ×3 IMPLANT
GLOVE BIOGEL PI IND STRL 7.0 (GLOVE) ×3 IMPLANT
GLOVE BIOGEL PI IND STRL 7.5 (GLOVE) ×1 IMPLANT
GLOVE BIOGEL PI INDICATOR 7.0 (GLOVE) ×6
GLOVE BIOGEL PI INDICATOR 7.5 (GLOVE) ×2
GLOVE ECLIPSE 6.5 STRL STRAW (GLOVE) ×6 IMPLANT
GLOVE ECLIPSE 7.0 STRL STRAW (GLOVE) ×3 IMPLANT
GLOVE SURG SS PI 7.5 STRL IVOR (GLOVE) ×6 IMPLANT
GOWN STRL REUS W/ TWL XL LVL3 (GOWN DISPOSABLE) ×1 IMPLANT
GOWN STRL REUS W/TWL LRG LVL3 (GOWN DISPOSABLE) ×9 IMPLANT
GOWN STRL REUS W/TWL XL LVL3 (GOWN DISPOSABLE) ×2
INST SET MAJOR GENERAL (KITS) ×3 IMPLANT
KIT ROOM TURNOVER APOR (KITS) ×3 IMPLANT
LIGASURE IMPACT 36 18CM CVD LR (INSTRUMENTS) ×3 IMPLANT
MANIFOLD NEPTUNE II (INSTRUMENTS) ×3 IMPLANT
NS IRRIG 1000ML POUR BTL (IV SOLUTION) ×9 IMPLANT
PACK ABDOMINAL MAJOR (CUSTOM PROCEDURE TRAY) ×3 IMPLANT
PAD ARMBOARD 7.5X6 YLW CONV (MISCELLANEOUS) ×3 IMPLANT
RTRCTR WOUND ALEXIS 18CM MED (MISCELLANEOUS) ×3
SET BASIN LINEN APH (SET/KITS/TRAYS/PACK) ×3 IMPLANT
SPONGE DRAIN TRACH 4X4 STRL 2S (GAUZE/BANDAGES/DRESSINGS) ×3 IMPLANT
STAPLER VISISTAT (STAPLE) ×3 IMPLANT
SUT ETHILON 3 0 FSL (SUTURE) ×3 IMPLANT
SUT NOVA NAB GS-26 0 60 (SUTURE) ×6 IMPLANT
SUT SILK 3 0 SH CR/8 (SUTURE) ×3 IMPLANT
TAPE CLOTH SURG 4X10 WHT LF (GAUZE/BANDAGES/DRESSINGS) ×3 IMPLANT
TRAY FOLEY CATH SILVER 16FR (SET/KITS/TRAYS/PACK) ×3 IMPLANT

## 2016-04-14 NOTE — ED Provider Notes (Signed)
CSN: [id number]     Arrival date & time 04/14/16  [id number] History   First MD Initiated Contact with Patient 04/14/16 [phone]     Chief Complaint  Patient presents with  . Abdominal Pain     HPI  Patient presents after the sudden onset of severe epigastric pain this morning while in the shower. He awakened, did not eat, got in the shower to regular work. Sudden severe epigastric pain spreading throughout the epigastrium and right upper quadrant. No back pain. No chest pain or shortness of breath.  It is worsened with movement and breathing. No chest pain.  Significant history of recurrent diverticulitis. Requires left partial colectomy February 2016 with diverticulitis.  He drinks daily. He smokes. Denies excessive anti-inflammatories. Takes antacids for equally. Was on Prilosec for "ulcer" that he quit 10 days ago because symptoms were improved. No blood in the stools. No vomiting today.  Past Medical History  Diagnosis Date  . Allergy   . Hypertension   . GAD (generalized anxiety disorder)   . Diverticulitis   . ETOH abuse   . Colonic diverticular abscess 12/26/2014   Past Surgical History  Procedure Laterality Date  . Rotator cuff repair    . Hernia repair    . Hemorrhoid surgery    . Partial colectomy N/A 01/01/2015    Procedure: PARTIAL COLECTOMY;  Surgeon: Jamesetta So, MD;  Location: [facility];  Service: General;  Laterality: N/A;  . Appendectomy N/A 01/01/2015    Procedure: INCIDENTAL APPENDECTOMY;  Surgeon: Jamesetta So, MD;  Location: [facility];  Service: General;  Laterality: N/A;   Family History  Problem Relation Age of Onset  . Stroke Mother   . CAD Father    Social History  Substance Use Topics  . Smoking status: Current Every Day Smoker -- 1.00 packs/day    Types: Cigarettes  . Smokeless tobacco: Never Used  . Alcohol Use: 2.4 oz/week    4 Cans of beer, 0 Standard drinks or equivalent per week     Comment: patient states "about 5 or 6 a day" (beers)    Review of  Systems  Constitutional: Negative for fever, chills, diaphoresis, appetite change and fatigue.  HENT: Negative for mouth sores, sore throat and trouble swallowing.   Eyes: Negative for visual disturbance.  Respiratory: Negative for cough, chest tightness, shortness of breath and wheezing.   Cardiovascular: Negative for chest pain.  Gastrointestinal: Positive for abdominal pain. Negative for nausea, vomiting, diarrhea and abdominal distention.  Endocrine: Negative for polydipsia, polyphagia and polyuria.  Genitourinary: Negative for dysuria, frequency and hematuria.  Musculoskeletal: Negative for gait problem.  Skin: Negative for color change, pallor and rash.  Neurological: Negative for dizziness, syncope, light-headedness and headaches.  Hematological: Does not bruise/bleed easily.  Psychiatric/Behavioral: Negative for behavioral problems and confusion.      Allergies  Review of patient's allergies indicates no known allergies.  Home Medications   Prior to Admission medications   Medication Sig Start Date End Date Taking? Authorizing Provider  atenolol (TENORMIN) 50 MG tablet TAKE 1 TABLET BY MOUTH EVERY DAY 02/05/16   Susy Frizzle, MD  clonazePAM (KLONOPIN) 0.5 MG tablet TAKE 1 TABLET BY MOUTH TWICE A DAY AS NEEDED (MUST LAST 30 DAYS) 04/10/16   Susy Frizzle, MD  cyclobenzaprine (FLEXERIL) 10 MG tablet TAKE 1 TABLET BY MOUTH AT BEDTIME AS NEEDED 02/05/16   Susy Frizzle, MD  fluticasone (FLONASE) 50 MCG/ACT nasal spray Place 2 sprays into both nostrils daily.  03/23/15   Susy Frizzle, MD  losartan-hydrochlorothiazide (HYZAAR) 50-12.5 MG tablet TAKE 1 TABLET BY MOUTH DAILY. 02/04/16   Susy Frizzle, MD  NASAL SALINE NA Place 1 spray into the nose daily as needed. For nose bleeds    Historical Provider, MD  omeprazole (PRILOSEC) 40 MG capsule Take 1 capsule (40 mg total) by mouth daily. 03/27/16   Susy Frizzle, MD  simvastatin (ZOCOR) 40 MG tablet TAKE 1 TABLET BY MOUTH  EVERY DAY 02/04/16   Susy Frizzle, MD  venlafaxine XR (EFFEXOR-XR) 150 MG 24 hr capsule TAKE ONE CAPSULE IN THE MORNING 02/05/16   Susy Frizzle, MD   BP 146/99 mmHg  Pulse 95  Temp(Src) 97.5 F (36.4 C) (Oral)  Resp 13  Ht 5\' 8"  (1.727 m)  Wt 185 lb (83.915 kg)  BMI 28.14 kg/m2  SpO2 93% Physical Exam  Constitutional: He is oriented to person, place, and time. He appears well-developed and well-nourished. He appears distressed.  Adult male. Awake and alert. Writhing with discomfort and pain.  HENT:  Head: Normocephalic.  Eyes: Conjunctivae are normal. Pupils are equal, round, and reactive to light. No scleral icterus.  Neck: Normal range of motion. Neck supple. No thyromegaly present.  Cardiovascular: Normal rate and regular rhythm.  Exam reveals no gallop and no friction rub.   No murmur heard. Pulmonary/Chest: Effort normal and breath sounds normal. No respiratory distress. He has no wheezes. He has no rales.  Abdominal: Soft. Bowel sounds are normal. He exhibits no distension. There is tenderness in the right upper quadrant and epigastric area. There is rebound.  Soft lower abdomen. Tenderness to any palpation of the upper abdomen including the gastrium and right upper quadrant. No rigidity.  Musculoskeletal: Normal range of motion.  Neurological: He is alert and oriented to person, place, and time.  Skin: Skin is warm and dry. No rash noted.  Psychiatric: He has a normal mood and affect. His behavior is normal.    ED Course  Procedures (including critical care time) Labs Review Labs Reviewed  COMPREHENSIVE METABOLIC PANEL - Abnormal; Notable for the following:    Glucose, Bld 116 (*)    Total Protein 6.4 (*)    Total Bilirubin 0.2 (*)    All other components within normal limits  LIPASE, BLOOD  CBC WITH DIFFERENTIAL/PLATELET  TROPONIN I    Imaging Review Dg Chest 1 View  04/14/2016  CLINICAL DATA:  Pt c/o epigastric pain that started this am EXAM: CHEST 1 VIEW  COMPARISON:  12/30/2014 FINDINGS: The heart size and vascular pattern are normal. The right lung is clear. There is mild left base atelectasis. No pleural effusions or pneumothoraces. IMPRESSION: Mild subsegmental left lower lobe atelectasis. Electronically Signed   By: Skipper Cliche M.D.   On: 04/14/2016 08:51   Ct Renal Stone Study  04/14/2016  CLINICAL DATA:  Epigastric pain.  History peptic ulcer disease EXAM: CT ABDOMEN AND PELVIS WITHOUT CONTRAST TECHNIQUE: Multidetector CT imaging of the abdomen and pelvis was performed following the standard protocol without IV contrast. COMPARISON:  CT abdomen pelvis 12/30/2014 FINDINGS: Lower chest: Lung bases clear without infiltrate or effusion. Heart size normal. Hepatobiliary: Possible layering noncalcified gallstones as noted previously. Mild edema surrounding the gallbladder most likely due to adjacent inflammation in the gastric antrum. Gallbladder wall not thickened. Bile ducts nondilated. Liver shows no focal lesion. Free air and free fluid noted around the liver. Pancreas: Negative Spleen: Negative Adrenals/Urinary Tract: No renal mass or obstruction. No  urinary tract calculi. 2 x 3 cm left renal calculus unchanged. Urinary bladder normal. Stomach/Bowel: Mucosal edema involving the gastric antrum. There appears to be ulceration in the anterior wall of the antrum which is most likely the source of free air. Extraluminal gas seen adjacent to this ulcer. Negative for bowel obstruction. Appendectomy clips noted. Negative for diverticulitis. Small hiatal hernia. Vascular/Lymphatic: Negative for aortic aneurysm. Mild atherosclerotic calcification in the aorta. No lymphadenopathy. Reproductive: Mild prostate enlargement with calcification. Other: Pneumoperitoneum around the liver and spleen. There is also free fluid. Musculoskeletal: Disc degeneration and spondylosis L5-S1. No acute bony abnormality. IMPRESSION: Pneumoperitoneum with associated fluid. Findings are  most consistent with perforated viscus. This appears to be related to an ulcer of the gastric antrum which has perforated. There is mucosal edema in the gastric antrum. These results were called by telephone at the time of interpretation on 04/14/2016 at 8:53 am to Dr. Tanna Furry , who verbally acknowledged these results. Electronically Signed   By: Franchot Gallo M.D.   On: 04/14/2016 08:54   I have personally reviewed and evaluated these images and lab results as part of my medical decision-making.   EKG Interpretation   Date/Time:  Monday Apr 14 2016 06:47:25 EDT Ventricular Rate:  73 PR Interval:  163 QRS Duration: 93 QT Interval:  380 QTC Calculation: 419 R Axis:   86 Text Interpretation:  Sinus rhythm Anterior infarct, old When compared  with ECG of 12/31/2014, Nonspecific T wave abnormality is no longer Present  Confirmed by [facility]  MD, DAVID ([id number]) on 04/14/2016 6:51:33 AM      MDM   Final diagnoses:  Perforated viscus    Sudden onset of severe pain. Concerns for perforation of ulcer. Would include biliary colic, Coley cystitis. Patient medicated. Labs and CT obtained.  CT shows free air in the upper epigastrium with thickening of the gastric antrum consistent with probable perforated peptic ulcer and pneumoperitoneum. IV fluids started. Preop chest x-ray EKG ordered. I placed a call to general surgery. Zosyn ordered.    Tanna Furry, MD 04/14/16 [phone]

## 2016-04-14 NOTE — ED Provider Notes (Signed)
MSE was initiated and I personally evaluated the patient and placed orders (if any) at  6:50 AM on Apr 14, 2016.  The patient appears stable so that the remainder of the MSE may be completed by another provider. 50 year old male had onset of upper abdominal pain while taking a shower this morning. There is no radiation pain. He states it is worse when he takes a deep breath but there is no dyspnea and no nausea. He came in by ambulance and received some morphine en route with modest reduction in pain. On exam, he has significant tenderness across the upper abdomen. Initial laboratory workup is initiated. ECG shows no acute changes.   EKG Interpretation  Date/Time:  Monday Apr 14 2016 06:47:25 EDT Ventricular Rate:  73 PR Interval:  163 QRS Duration: 93 QT Interval:  380 QTC Calculation: 419 R Axis:   86 Text Interpretation:  Sinus rhythm Anterior infarct, old When compared with ECG of 12/31/2014, Nonspecific T wave abnormality is no longer Present Confirmed by Roxanne Mins  MD, Shakthi Scipio ([id number]) on 04/14/2016 6:51:33 AM        Delora Fuel, MD [id number]

## 2016-04-14 NOTE — Anesthesia Procedure Notes (Signed)
Procedure Name: Intubation Date/Time: 04/14/2016 11:32 AM Performed by: Andree Elk, Sissy Goetzke A Pre-anesthesia Checklist: Patient identified, Patient being monitored, Timeout performed, Emergency Drugs available and Suction available Patient Re-evaluated:Patient Re-evaluated prior to inductionOxygen Delivery Method: Circle System Utilized Preoxygenation: Pre-oxygenation with 100% oxygen Intubation Type: IV induction, Rapid sequence and Cricoid Pressure applied Laryngoscope Size: 3 and Glidescope Grade View: Grade I Tube type: Oral Tube size: 7.0 mm Number of attempts: 1 Airway Equipment and Method: Stylet Placement Confirmation: ETT inserted through vocal cords under direct vision,  positive ETCO2 and breath sounds checked- equal and bilateral Secured at: 21 cm Tube secured with: Tape Dental Injury: Teeth and Oropharynx as per pre-operative assessment

## 2016-04-14 NOTE — Consult Note (Signed)
SURGICAL CONSULTATION NOTE (initial)  HISTORY OF PRESENT ILLNESS (HPI):  50 y.o. male presented with sudden onset of severe epigastric abdominal pain this morning while in the shower. Patient reports he'd previously been taking Prilosec OTC prn for GERD until advised 2 months ago by his PMD to take Prilosec daily x 3 months. Because he was feeling better, he self-discontinued the Prilosec. Last week, he noted dark burgundy bowel movements, but otherwise felt okay until this morning. Patient denies fever/chills, CP, or SOB.  PAST MEDICAL HISTORY (PMH):  Past Medical History  Diagnosis Date  . Allergy   . Hypertension   . GAD (generalized anxiety disorder)   . Diverticulitis   . ETOH abuse   . Colonic diverticular abscess 12/26/2014     PAST SURGICAL HISTORY [facility]):  Past Surgical History  Procedure Laterality Date  . Rotator cuff repair    . Hernia repair    . Hemorrhoid surgery    . Partial colectomy N/A 01/01/2015    Procedure: PARTIAL COLECTOMY;  Surgeon: Jamesetta So, MD;  Location: [facility];  Service: General;  Laterality: N/A;  . Appendectomy N/A 01/01/2015    Procedure: INCIDENTAL APPENDECTOMY;  Surgeon: Jamesetta So, MD;  Location: [facility];  Service: General;  Laterality: N/A;     MEDICATIONS:  Prior to Admission medications   Medication Sig Start Date End Date Taking? Authorizing Provider  atenolol (TENORMIN) 50 MG tablet TAKE 1 TABLET BY MOUTH EVERY DAY 02/05/16   Susy Frizzle, MD  clonazePAM (KLONOPIN) 0.5 MG tablet TAKE 1 TABLET BY MOUTH TWICE A DAY AS NEEDED (MUST LAST 30 DAYS) 04/10/16   Susy Frizzle, MD  cyclobenzaprine (FLEXERIL) 10 MG tablet TAKE 1 TABLET BY MOUTH AT BEDTIME AS NEEDED 02/05/16   Susy Frizzle, MD  fluticasone (FLONASE) 50 MCG/ACT nasal spray Place 2 sprays into both nostrils daily. 03/23/15   Susy Frizzle, MD  losartan-hydrochlorothiazide (HYZAAR) 50-12.5 MG tablet TAKE 1 TABLET BY MOUTH DAILY. 02/04/16   Susy Frizzle, MD  NASAL SALINE  NA Place 1 spray into the nose daily as needed. For nose bleeds    Historical Provider, MD  omeprazole (PRILOSEC) 40 MG capsule Take 1 capsule (40 mg total) by mouth daily. 03/27/16   Susy Frizzle, MD  simvastatin (ZOCOR) 40 MG tablet TAKE 1 TABLET BY MOUTH EVERY DAY 02/04/16   Susy Frizzle, MD  venlafaxine XR (EFFEXOR-XR) 150 MG 24 hr capsule TAKE ONE CAPSULE IN THE MORNING 02/05/16   Susy Frizzle, MD     ALLERGIES:  No Known Allergies   SOCIAL HISTORY:  Social History   Social History  . Marital Status: Divorced    Spouse Name: N/A  . Number of Children: N/A  . Years of Education: N/A   Occupational History  . Not on file.   Social History Main Topics  . Smoking status: Current Every Day Smoker -- 1.00 packs/day    Types: Cigarettes  . Smokeless tobacco: Never Used  . Alcohol Use: 2.4 oz/week    4 Cans of beer, 0 Standard drinks or equivalent per week     Comment: patient states "about 5 or 6 a day" (beers)  . Drug Use: No  . Sexual Activity: Yes   Other Topics Concern  . Not on file   Social History Narrative    The patient currently resides (home / rehab facility / nursing home): Home  The patient normally is (ambulatory / bedbound): Ambulatory   FAMILY  HISTORY:  Family History  Problem Relation Age of Onset  . Stroke Mother   . CAD Father      REVIEW OF SYSTEMS:  Constitutional: denies weight loss, fever, chills, or sweats  Eyes: denies any other vision changes, history of eye injury  ENT: denies sore throat, hearing problems  Respiratory: denies shortness of breath, wheezing  Cardiovascular: denies chest pain, palpitations  Gastrointestinal: severe upper abdominal pain and melena as per HPI, denies N/V  Musculoskeletal: denies any other joint pains or cramps  Skin: denies any other rashes or skin discolorations  Neurological: denies any other headache, dizziness, weakness  Psychiatric: denies any other depression, anxiety   All other review of  systems were negative   VITAL SIGNS:  Temp:  [97.5 F (36.4 C)] 97.5 F (36.4 C) (05/22 0649) Pulse Rate:  [72-80] 76 (05/22 0830) Resp:  [12-23] 13 (05/22 0815) BP: (101-176)/(57-119) 140/119 mmHg (05/22 0915) SpO2:  [97 %-99 %] 97 % (05/22 0830) Weight:  [83.915 kg (185 lb)] 83.915 kg (185 lb) (05/22 0649)     Height: 5\' 8"  (172.7 cm) Weight: 83.915 kg (185 lb) BMI (Calculated): 28.2   INTAKE/OUTPUT:  This shift:    Last 2 shifts: @IOLAST2SHIFTS @   PHYSICAL EXAM:  Constitutional:  -- Normal body habitus  -- Awake, alert, and oriented x3  Eyes:  -- Pupils equally round and reactive to light  -- No scleral icterus  Ear, nose, and throat:  -- No jugular venous distension  Pulmonary:  -- No crackles  -- Equal breath sounds bilaterally  Cardiovascular:  -- S1, S2 present  -- No pericardial rubs Abdomen:  -- Rigid with exquisite upper abdominal tenderness to even light palpation with guarding and rebound, but only mild LLQ >> RLQ abdominal pain, well-healed lower midline laparotomy incision -- No pulsatile abdominal mass appreciated  Musculoskeletal / Integumentary:  -- Wounds or skin discoloration: None  -- Extremities: B/L UE and LE FROM, hands and feet warm, no edema  Neurologic:  -- Motor function: intact and symmetric  -- Sensation: intact and symmetric   Labs:  CBC:  Lab Results  Component Value Date   WBC 9.3 04/14/2016   RBC 4.85 04/14/2016   BMP:  Lab Results  Component Value Date   GLUCOSE 116* 04/14/2016   CO2 26 04/14/2016   BUN 16 04/14/2016   CREATININE 0.79 04/14/2016   CREATININE 1.03 04/08/2016   CALCIUM 9.1 04/14/2016   Coagulation:  Lab Results  Component Value Date   INR 1.19 12/27/2014   APTT 36 12/27/2014     Imaging studies:  CT Abdomen and Pelvis Without Contrast (04/14/2016) Stomach/Bowel:  Mucosal edema involving the gastric antrum. There appears to be ulceration in the anterior wall of the antrum which is most likely the source  of free air. Extraluminal gas seen adjacent to this ulcer. Negative for bowel obstruction. Appendectomy clips noted. Negative for diverticulitis. Small hiatal hernia.  Hepatobiliary: Possible layering noncalcified gallstones as noted previously. Mild edema surrounding the gallbladder most likely due to adjacent inflammation in the gastric antrum. Gallbladder wall not thickened. Bile ducts nondilated. Liver shows no focal lesion. Free air and free fluid noted around the liver.  Assessment/Plan:  50 y.o. male with perforated gastric (antrum) ulcer, complicated by pertinent comorbidities including alcohol and tobacco abuse, prior open colon resection for recurrent symptomatic sigmoid diverticulitis.   - NPO, IVF (1L bolus +)   - IV antibiotics (Zosyn) + PPI   - All risks, benefits, and alternatives to  gastrorrhaphy, possible gastric resection, possible vagotomy discussed   - DVT prophylaxis  All of the above findings and recommendations were discussed with the patient and his family, and all of his and family's questions were answered to his expressed satisfaction.  Thank you for the opportunity to participate in the care for this patient.   -- Marilynne Drivers Rosana Hoes, [place] Office: [phone]

## 2016-04-14 NOTE — Anesthesia Postprocedure Evaluation (Signed)
Anesthesia Post Note  Patient: [name]  Procedure(s) Performed: Procedure(s) (LRB): GASTRORRHAPHY (N/A)  Patient location during evaluation: PACU Anesthesia Type: General Level of consciousness: awake and alert and oriented Pain management: pain level controlled Vital Signs Assessment: post-procedure vital signs reviewed and stable Respiratory status: respiratory function stable and patient connected to nasal cannula oxygen Cardiovascular status: stable Postop Assessment: no signs of nausea or vomiting Anesthetic complications: no    Last Vitals:  Filed Vitals:   04/14/16 1315 04/14/16 1330  BP: 137/100 134/89  Pulse: 85 83  Temp:    Resp: 17 16    Last Pain:  Filed Vitals:   04/14/16 1341  PainSc: 2                  Aryaa Bunting A

## 2016-04-14 NOTE — H&P (Addendum)
SURGICAL ADMISSION HISTORY & PHYSICAL  HISTORY OF PRESENT ILLNESS (HPI):  50 y.o. male presented with sudden onset of severe epigastric abdominal pain this morning while in the shower. Patient reports he'd previously been taking Prilosec OTC prn for GERD until advised 2 months ago by his PMD to take Prilosec daily x 3 months. Because he was feeling better, he self-discontinued the Prilosec. Last week, he noted dark burgundy bowel movements, but otherwise felt okay until this morning. Patient denies fever/chills, CP, or SOB.  PAST MEDICAL HISTORY (PMH):  Past Medical History  Diagnosis Date  . Allergy   . Hypertension   . GAD (generalized anxiety disorder)   . Diverticulitis   . ETOH abuse   . Colonic diverticular abscess 12/26/2014     PAST SURGICAL HISTORY [facility]):  Past Surgical History  Procedure Laterality Date  . Rotator cuff repair    . Hernia repair    . Hemorrhoid surgery    . Partial colectomy N/A 01/01/2015    Procedure: PARTIAL COLECTOMY;  Surgeon: Jamesetta So, MD;  Location: [facility];  Service: General;  Laterality: N/A;  . Appendectomy N/A 01/01/2015    Procedure: INCIDENTAL APPENDECTOMY;  Surgeon: Jamesetta So, MD;  Location: [facility];  Service: General;  Laterality: N/A;     MEDICATIONS:  Prior to Admission medications   Medication Sig Start Date End Date Taking? Authorizing Provider  atenolol (TENORMIN) 50 MG tablet TAKE 1 TABLET BY MOUTH EVERY DAY 02/05/16   Susy Frizzle, MD  clonazePAM (KLONOPIN) 0.5 MG tablet TAKE 1 TABLET BY MOUTH TWICE A DAY AS NEEDED (MUST LAST 30 DAYS) 04/10/16   Susy Frizzle, MD  cyclobenzaprine (FLEXERIL) 10 MG tablet TAKE 1 TABLET BY MOUTH AT BEDTIME AS NEEDED 02/05/16   Susy Frizzle, MD  fluticasone (FLONASE) 50 MCG/ACT nasal spray Place 2 sprays into both nostrils daily. 03/23/15   Susy Frizzle, MD  losartan-hydrochlorothiazide (HYZAAR) 50-12.5 MG tablet TAKE 1 TABLET BY MOUTH DAILY. 02/04/16   Susy Frizzle, MD  NASAL SALINE  NA Place 1 spray into the nose daily as needed. For nose bleeds    Historical Provider, MD  omeprazole (PRILOSEC) 40 MG capsule Take 1 capsule (40 mg total) by mouth daily. 03/27/16   Susy Frizzle, MD  simvastatin (ZOCOR) 40 MG tablet TAKE 1 TABLET BY MOUTH EVERY DAY 02/04/16   Susy Frizzle, MD  venlafaxine XR (EFFEXOR-XR) 150 MG 24 hr capsule TAKE ONE CAPSULE IN THE MORNING 02/05/16   Susy Frizzle, MD     ALLERGIES:  No Known Allergies   SOCIAL HISTORY:  Social History   Social History  . Marital Status: Divorced    Spouse Name: N/A  . Number of Children: N/A  . Years of Education: N/A   Occupational History  . Not on file.   Social History Main Topics  . Smoking status: Current Every Day Smoker -- 1.00 packs/day    Types: Cigarettes  . Smokeless tobacco: Never Used  . Alcohol Use: 2.4 oz/week    4 Cans of beer, 0 Standard drinks or equivalent per week     Comment: patient states "about 5 or 6 a day" (beers)  . Drug Use: No  . Sexual Activity: Yes   Other Topics Concern  . Not on file   Social History Narrative    The patient currently resides (home / rehab facility / nursing home): Home  The patient normally is (ambulatory / bedbound): Ambulatory  FAMILY HISTORY:  Family History  Problem Relation Age of Onset  . Stroke Mother   . CAD Father      REVIEW OF SYSTEMS:  Constitutional: denies weight loss, fever, chills, or sweats  Eyes: denies any other vision changes, history of eye injury  ENT: denies sore throat, hearing problems  Respiratory: denies shortness of breath, wheezing  Cardiovascular: denies chest pain, palpitations  Gastrointestinal: severe upper abdominal pain and melena as per HPI, denies N/V  Musculoskeletal: denies any other joint pains or cramps  Skin: denies any other rashes or skin discolorations  Neurological: denies any other headache, dizziness, weakness  Psychiatric: denies any other depression, anxiety   All other review of  systems were negative   VITAL SIGNS:  Temp:  [97.5 F (36.4 C)] 97.5 F (36.4 C) (05/22 0649) Pulse Rate:  [72-80] 76 (05/22 0830) Resp:  [12-23] 13 (05/22 0815) BP: (101-176)/(57-119) 140/119 mmHg (05/22 0915) SpO2:  [97 %-99 %] 97 % (05/22 0830) Weight:  [83.915 kg (185 lb)] 83.915 kg (185 lb) (05/22 0649)     Height: 5\' 8"  (172.7 cm) Weight: 83.915 kg (185 lb) BMI (Calculated): 28.2   INTAKE/OUTPUT:  This shift:    Last 2 shifts: @IOLAST2SHIFTS @   PHYSICAL EXAM:  Constitutional:  -- Normal body habitus  -- Awake, alert, and oriented x3  Eyes:  -- Pupils equally round and reactive to light  -- No scleral icterus  Ear, nose, and throat:  -- No jugular venous distension  Pulmonary:  -- No crackles  -- Equal breath sounds bilaterally  Cardiovascular:  -- S1, S2 present  -- No pericardial rubs Abdomen:  -- Rigid with exquisite upper abdominal tenderness to even light palpation with guarding and rebound, but only mild LLQ >> RLQ abdominal pain, well-healed lower midline laparotomy incision -- No pulsatile abdominal mass appreciated  Musculoskeletal / Integumentary:  -- Wounds or skin discoloration: None  -- Extremities: B/L UE and LE FROM, hands and feet warm, no edema  Neurologic:  -- Motor function: intact and symmetric  -- Sensation: intact and symmetric   Labs:  CBC:  Lab Results  Component Value Date   WBC 9.3 04/14/2016   RBC 4.85 04/14/2016   BMP:  Lab Results  Component Value Date   GLUCOSE 116* 04/14/2016   CO2 26 04/14/2016   BUN 16 04/14/2016   CREATININE 0.79 04/14/2016   CREATININE 1.03 04/08/2016   CALCIUM 9.1 04/14/2016   Coagulation:  Lab Results  Component Value Date   INR 1.19 12/27/2014   APTT 36 12/27/2014     Imaging studies:  CT Abdomen and Pelvis Without Contrast (04/14/2016) Stomach/Bowel:  Mucosal edema involving the gastric antrum. There appears to be ulceration in the anterior wall of the antrum which is most likely the source  of free air. Extraluminal gas seen adjacent to this ulcer. Negative for bowel obstruction. Appendectomy clips noted. Negative for diverticulitis. Small hiatal hernia.  Hepatobiliary: Possible layering noncalcified gallstones as noted previously. Mild edema surrounding the gallbladder most likely due to adjacent inflammation in the gastric antrum. Gallbladder wall not thickened. Bile ducts nondilated. Liver shows no focal lesion. Free air and free fluid noted around the liver.  Assessment/Plan:  50 y.o. male with perforated gastric (antrum) ulcer, complicated by pertinent comorbidities including alcohol and tobacco abuse, prior open colon resection for recurrent symptomatic sigmoid diverticulitis.   - NPO, IVF (1L bolus +)   - IV antibiotics (Zosyn) + PPI   - All risks, benefits, and alternatives  to gastrorrhaphy, possible gastric resection, possible vagotomy discussed   - DVT prophylaxis  All of the above findings and recommendations were discussed with the patient and his family, and all of his and family's questions were answered to his expressed satisfaction.  Thank you for the opportunity to participate in the care for this patient.   -- Marilynne Drivers Rosana Hoes, [place]: [place] [facility] Office: [phone]

## 2016-04-14 NOTE — ED Notes (Signed)
Pt c/o epigastric pain that started this am, was given 6 mg morphine by ems enroute to er,

## 2016-04-14 NOTE — Transfer of Care (Signed)
Immediate Anesthesia Transfer of Care Note  Patient: [name]  Procedure(s) Performed: Procedure(s): GASTRORRHAPHY (N/A)  Patient Location: PACU  Anesthesia Type:General  Level of Consciousness: awake and patient cooperative  Airway & Oxygen Therapy: Patient Spontanous Breathing and Patient connected to face mask oxygen  Post-op Assessment: Report given to RN, Post -op Vital signs reviewed and stable and Patient moving all extremities  Post vital signs: Reviewed and stable  Last Vitals:  Filed Vitals:   04/14/16 1110 04/14/16 1115  BP: 160/112 151/101  Pulse:    Temp:    Resp: 23 19    Last Pain:  Filed Vitals:   04/14/16 1115  PainSc: 8       Patients Stated Pain Goal: 5 ([id number])  Complications: No apparent anesthesia complications

## 2016-04-14 NOTE — Progress Notes (Signed)
   04/14/16 1059  OBSTRUCTIVE SLEEP APNEA  Have you ever been diagnosed with sleep apnea through a sleep study? No  Do you snore loudly (loud enough to be heard through closed doors)?  1  Do you often feel tired, fatigued, or sleepy during the daytime (such as falling asleep during driving or talking to someone)? 1  Has anyone observed you stop breathing during your sleep? 0  Do you have, or are you being treated for high blood pressure? 1  BMI more than 35 kg/m2? 0  Age > 50 (1-yes) 1  Neck circumference greater than:Male 16 inches or larger, Male 17inches or larger? 0  Male Gender (Yes=1) 1  Obstructive Sleep Apnea Score 5

## 2016-04-14 NOTE — Op Note (Signed)
Patient:  [name]  DOB:  1966-01-04  MRN:  [id number]   Preop Diagnosis:  Perforated viscus  Postop Diagnosis:  Pyloric peptic ulcer perforation  Procedure:  Gastrorrhaphy Phillip Heal plication)  Surgeon:  Aviva Signs, M.D.  Assistant: Tama High M.D.  Anes:  Gen. endotracheal  Indications:  Patient is a 50 year old white male who presented emergency room with a sudden onset of upper abdominal pain. He presented emergency room and was noted on CT scan the abdomen to have pneumoperitoneum with a perforated viscus. It was felt that it was in the antrum of the stomach. The patient gives a history of taking BC powders extensively in the past. He recently stopped taking medicine for stomach. The risks and benefits of the procedure including bleeding, infection, and the recurrence of the ulceration were fully explained to the patient, who gave informed consent.  Procedure note:  The patient was placed the supine position. After induction of general endotracheal anesthesia, the abdomen was prepped and draped using the usual sterile technique with DuraPrep. Surgical site confirmation was performed.  An upper midline incision was made towards the umbilicus. The peritoneal cavity was entered into without difficulty. Cloudy fluid was noted in the gastroduodenal area, subhepatic in nature. A small 3 mm full-thickness perforation was noted right at the pyloric juncture anteriorly. The cloudy fluid in this area was evacuated. Using the LigaSure. A piece of omentum was mobilized and brought up to patch the defect in a Graham plication manner. It was secured in place using 3-0 silk sutures. In addition, some adipose tissue along the lesser curve of the stomach was brought over this repair and secured in place using 3-0 silk sutures. The NG tube was noted be in appropriate position in the stomach by palpation. The abdominal cavity was then copiously irrigated with normal saline until the effluent was  clear.  A #10 flat check spread drain was placed over the repair and brought through separate stab wound to the left of midline incision. It was secured in place using a 3-0 nylon interrupted suture. The fascia was reapproximated using a looped 0 PDS running suture. Subcutaneous layer was irrigated normal saline. Exparel was instilled into the surrounding wound. The incision was closed using staples. Betadine ointment and dry sterile dressing were applied.  All tape and needle counts were correct at the end of the procedure. Patient was extubated in the operating room and transferred to PACU in stable condition.  Complications:  None  EBL:  25 mL  Specimen:  None  Drains: Jackson-Pratt drain to subhepatic space

## 2016-04-14 NOTE — Anesthesia Preprocedure Evaluation (Signed)
Anesthesia Evaluation  Patient identified by MRN, date of birth, ID band Patient awake    Reviewed: Allergy & Precautions, NPO status , Patient's Chart, lab work & pertinent test results, reviewed documented beta blocker date and time   Airway Mallampati: III  TM Distance: >3 FB     Dental  (+) Teeth Intact   Pulmonary Current Smoker,    breath sounds clear to auscultation       Cardiovascular hypertension, Pt. on medications and Pt. on home beta blockers  Rhythm:Regular Rate:Normal     Neuro/Psych PSYCHIATRIC DISORDERS Anxiety    GI/Hepatic GERD  ,(+)     substance abuse  alcohol use,   Endo/Other    Renal/GU      Musculoskeletal   Abdominal   Peds  Hematology   Anesthesia Other Findings   Reproductive/Obstetrics                             Anesthesia Physical Anesthesia Plan  ASA: III and emergent  Anesthesia Plan: General   Post-op Pain Management:    Induction: Intravenous, Rapid sequence and Cricoid pressure planned  Airway Management Planned: Oral ETT  Additional Equipment:   Intra-op Plan:   Post-operative Plan: Extubation in OR  Informed Consent: I have reviewed the patients History and Physical, chart, labs and discussed the procedure including the risks, benefits and alternatives for the proposed anesthesia with the patient or authorized representative who has indicated his/her understanding and acceptance.     Plan Discussed with:   Anesthesia Plan Comments:         Anesthesia Quick Evaluation

## 2016-04-14 NOTE — ED Notes (Signed)
Upon return from imaging, pt c/o shortness of breath. o2 noted at 92% RA. Placed on 2 liters [place] for comfort.

## 2016-04-14 NOTE — ED Notes (Signed)
No change in pain. MD notified. Repeat dilaudid IV.

## 2016-04-15 ENCOUNTER — Encounter (HOSPITAL_COMMUNITY): Payer: Self-pay | Admitting: General Surgery

## 2016-04-15 LAB — CBC
HCT: 43.3 % (ref 39.0–52.0)
HEMOGLOBIN: 14.3 g/dL (ref 13.0–17.0)
MCH: 32.5 pg (ref 26.0–34.0)
MCHC: 33 g/dL (ref 30.0–36.0)
MCV: 98.4 fL (ref 78.0–100.0)
PLATELETS: 236 10*3/uL (ref 150–400)
RBC: 4.4 MIL/uL (ref 4.22–5.81)
RDW: 13.4 % (ref 11.5–15.5)
WBC: 18.3 10*3/uL — ABNORMAL HIGH (ref 4.0–10.5)

## 2016-04-15 LAB — BASIC METABOLIC PANEL
Anion gap: 5 (ref 5–15)
BUN: 15 mg/dL (ref 6–20)
CHLORIDE: 105 mmol/L (ref 101–111)
CO2: 31 mmol/L (ref 22–32)
Calcium: 8.7 mg/dL — ABNORMAL LOW (ref 8.9–10.3)
Creatinine, Ser: 0.94 mg/dL (ref 0.61–1.24)
Glucose, Bld: 115 mg/dL — ABNORMAL HIGH (ref 65–99)
POTASSIUM: 4.2 mmol/L (ref 3.5–5.1)
SODIUM: 141 mmol/L (ref 135–145)

## 2016-04-15 LAB — PHOSPHORUS: PHOSPHORUS: 2.7 mg/dL (ref 2.5–4.6)

## 2016-04-15 LAB — MAGNESIUM: MAGNESIUM: 1.5 mg/dL — AB (ref 1.7–2.4)

## 2016-04-15 MED ORDER — MENTHOL 3 MG MT LOZG
1.0000 | LOZENGE | OROMUCOSAL | Status: DC | PRN
  Administered 2016-04-16: 3 mg via ORAL
  Filled 2016-04-15: qty 9

## 2016-04-15 MED ORDER — HYDROMORPHONE HCL 1 MG/ML IJ SOLN
1.5000 mg | INTRAMUSCULAR | Status: DC | PRN
  Administered 2016-04-15 – 2016-04-19 (×38): 1.5 mg via INTRAVENOUS
  Filled 2016-04-15 (×38): qty 2

## 2016-04-15 MED ORDER — MAGNESIUM SULFATE 2 GM/50ML IV SOLN
2.0000 g | Freq: Once | INTRAVENOUS | Status: AC
  Administered 2016-04-15: 2 g via INTRAVENOUS
  Filled 2016-04-15: qty 50

## 2016-04-15 NOTE — Addendum Note (Signed)
Addendum  created 04/15/16 1043 by Mickel Baas, CRNA   Modules edited: Clinical Notes   Clinical Notes:  File: [id number]

## 2016-04-15 NOTE — Progress Notes (Signed)
Pt has order to remove foley. Pt has refused foley removal.

## 2016-04-15 NOTE — Progress Notes (Signed)
1 Day Post-Op  Subjective: Still having moderate incisional pain. Is alert and oriented.  Objective: Vital signs in last 24 hours: Temp:  [97.4 F (36.3 C)-98.6 F (37 C)] 98.1 F (36.7 C) (05/23 0556) Pulse Rate:  [66-99] 89 (05/23 0556) Resp:  [16-24] 20 (05/23 0556) BP: (118-172)/(81-119) 146/97 mmHg (05/23 0556) SpO2:  [89 %-100 %] 95 % (05/23 0556) Weight:  [83.915 kg (185 lb)] 83.915 kg (185 lb) (05/22 1053) Last BM Date: 04/14/16  Intake/Output from previous day: 05/22 0701 - 05/23 0700 In: 5073.8 [I.V.:4943.8; IV Piggyback:100] Out: 1730 [Urine:975; Emesis/NG output:700; Drains:30; Blood:25] Intake/Output this shift:    General appearance: alert, cooperative and no distress Resp: clear to auscultation bilaterally Cardio: regular rate and rhythm, S1, S2 normal, no murmur, click, rub or gallop GI: Soft, incision healing well. JP drainage serosanguineous in nature.  Lab Results:   Recent Labs  04/14/16 0703 04/15/16 0730  WBC 9.3 18.3*  HGB 15.6 14.3  HCT 47.0 43.3  PLT 290 236   BMET  Recent Labs  04/14/16 0703 04/15/16 0730  NA 139 141  K 4.4 4.2  CL 107 105  CO2 26 31  GLUCOSE 116* 115*  BUN 16 15  CREATININE 0.79 0.94  CALCIUM 9.1 8.7*   PT/INR No results for input(s): LABPROT, INR in the last 72 hours.  Studies/Results: Dg Chest 1 View  04/14/2016  CLINICAL DATA:  Pt c/o epigastric pain that started this am EXAM: CHEST 1 VIEW COMPARISON:  12/30/2014 FINDINGS: The heart size and vascular pattern are normal. The right lung is clear. There is mild left base atelectasis. No pleural effusions or pneumothoraces. IMPRESSION: Mild subsegmental left lower lobe atelectasis. Electronically Signed   By: Skipper Cliche M.D.   On: 04/14/2016 08:51   Ct Renal Stone Study  04/14/2016  CLINICAL DATA:  Epigastric pain.  History peptic ulcer disease EXAM: CT ABDOMEN AND PELVIS WITHOUT CONTRAST TECHNIQUE: Multidetector CT imaging of the abdomen and pelvis was  performed following the standard protocol without IV contrast. COMPARISON:  CT abdomen pelvis 12/30/2014 FINDINGS: Lower chest: Lung bases clear without infiltrate or effusion. Heart size normal. Hepatobiliary: Possible layering noncalcified gallstones as noted previously. Mild edema surrounding the gallbladder most likely due to adjacent inflammation in the gastric antrum. Gallbladder wall not thickened. Bile ducts nondilated. Liver shows no focal lesion. Free air and free fluid noted around the liver. Pancreas: Negative Spleen: Negative Adrenals/Urinary Tract: No renal mass or obstruction. No urinary tract calculi. 2 x 3 cm left renal calculus unchanged. Urinary bladder normal. Stomach/Bowel: Mucosal edema involving the gastric antrum. There appears to be ulceration in the anterior wall of the antrum which is most likely the source of free air. Extraluminal gas seen adjacent to this ulcer. Negative for bowel obstruction. Appendectomy clips noted. Negative for diverticulitis. Small hiatal hernia. Vascular/Lymphatic: Negative for aortic aneurysm. Mild atherosclerotic calcification in the aorta. No lymphadenopathy. Reproductive: Mild prostate enlargement with calcification. Other: Pneumoperitoneum around the liver and spleen. There is also free fluid. Musculoskeletal: Disc degeneration and spondylosis L5-S1. No acute bony abnormality. IMPRESSION: Pneumoperitoneum with associated fluid. Findings are most consistent with perforated viscus. This appears to be related to an ulcer of the gastric antrum which has perforated. There is mucosal edema in the gastric antrum. These results were called by telephone at the time of interpretation on 04/14/2016 at 8:53 am to Dr. Tanna Furry , who verbally acknowledged these results. Electronically Signed   By: Franchot Gallo M.D.   On: 04/14/2016  08:54    Anti-infectives: Anti-infectives    Start     Dose/Rate Route Frequency Ordered Stop   04/14/16 1700  piperacillin-tazobactam  (ZOSYN) IVPB 3.375 g     3.375 g 12.5 mL/hr over 240 Minutes Intravenous Every 8 hours 04/14/16 1404     04/14/16 1115  ceFAZolin (ANCEF) IVPB 2g/100 mL premix     2 g 200 mL/hr over 30 Minutes Intravenous On call to O.R. 04/14/16 1100 04/14/16 1134   04/14/16 0915  piperacillin-tazobactam (ZOSYN) IVPB 3.375 g     3.375 g 100 mL/hr over 30 Minutes Intravenous  Once 04/14/16 0900 04/14/16 1037      Assessment/Plan: s/p Procedure(s): GASTRORRHAPHY Impression: Stable on postoperative day 1. Will remove Foley. Continue IV fluid hydration. Will supplement magnesium as it is low. Continue DT prophylaxis.  LOS: 1 day    [name] 04/15/2016

## 2016-04-15 NOTE — Anesthesia Postprocedure Evaluation (Signed)
Anesthesia Post Note  Patient: [name]  Procedure(s) Performed: Procedure(s) (LRB): GASTRORRHAPHY (N/A)  Patient location during evaluation: Nursing Unit Anesthesia Type: General Level of consciousness: awake and alert and oriented Pain management: pain level controlled Vital Signs Assessment: post-procedure vital signs reviewed and stable Respiratory status: spontaneous breathing and patient connected to nasal cannula oxygen Cardiovascular status: stable Postop Assessment: no signs of nausea or vomiting Anesthetic complications: no    Last Vitals:  Filed Vitals:   04/14/16 2330 04/15/16 0556  BP: 137/81 146/97  Pulse: 82 89  Temp: 36.5 C 36.7 C  Resp: 20 20    Last Pain:  Filed Vitals:   04/15/16 1035  PainSc: 10-Worst pain ever                 Annahi Short A

## 2016-04-15 NOTE — Care Management Note (Signed)
Case Management Note  Patient Details  Name: ERMEL BOYCHUK MRN: [id number] Date of Birth: 01/06/1966  Subjective/Objective : Patient Is from home with spouse. He is independent with needs. Does not have any problems getting to appointments or obtaining meds. He has a PCP and insurance. NO CM needs identified at this time.                    Action/Plan: Will cont. To follow.    Expected Discharge Date:  04/16/16               Expected Discharge Plan:  Home/Self Care  In-House Referral:     Discharge planning Services  CM Consult  Post Acute Care Choice:  NA Choice offered to:  NA  DME Arranged:    DME Agency:     HH Arranged:    HH Agency:     Status of Service:  In process, will continue to follow  Medicare Important Message Given:    Date Medicare IM Given:    Medicare IM give by:    Date Additional Medicare IM Given:    Additional Medicare Important Message give by:     If discussed at [place] of Stay Meetings, dates discussed:    Additional Comments:  Lulu Hirschmann, Chauncey Reading, RN 04/15/2016, 3:02 PM

## 2016-04-15 NOTE — Addendum Note (Signed)
Addendum  created 04/15/16 1038 by Mickel Baas, CRNA   Modules edited: Charges VN

## 2016-04-16 LAB — BASIC METABOLIC PANEL
ANION GAP: 9 (ref 5–15)
BUN: 12 mg/dL (ref 6–20)
CALCIUM: 8.5 mg/dL — AB (ref 8.9–10.3)
CO2: 26 mmol/L (ref 22–32)
CREATININE: 0.9 mg/dL (ref 0.61–1.24)
Chloride: 103 mmol/L (ref 101–111)
GFR calc Af Amer: >60 mL/min (ref >60)
GLUCOSE: 109 mg/dL — AB (ref 65–99)
Potassium: 3.6 mmol/L (ref 3.5–5.1)
Sodium: 138 mmol/L (ref 135–145)

## 2016-04-16 LAB — CBC
HCT: 42 % (ref 39.0–52.0)
Hemoglobin: 13.9 g/dL (ref 13.0–17.0)
MCH: 32.3 pg (ref 26.0–34.0)
MCHC: 33.1 g/dL (ref 30.0–36.0)
MCV: 97.7 fL (ref 78.0–100.0)
PLATELETS: 232 10*3/uL (ref 150–400)
RBC: 4.3 MIL/uL (ref 4.22–5.81)
RDW: 13.3 % (ref 11.5–15.5)
WBC: 17.2 10*3/uL — AB (ref 4.0–10.5)

## 2016-04-16 LAB — PHOSPHORUS: PHOSPHORUS: 3 mg/dL (ref 2.5–4.6)

## 2016-04-16 LAB — H. PYLORI ANTIBODY, IGG

## 2016-04-16 LAB — MAGNESIUM: Magnesium: 1.8 mg/dL (ref 1.7–2.4)

## 2016-04-16 MED ORDER — KCL IN DEXTROSE-NACL 20-5-0.45 MEQ/L-%-% IV SOLN
INTRAVENOUS | Status: DC
  Administered 2016-04-16 – 2016-04-17 (×3): via INTRAVENOUS

## 2016-04-16 NOTE — Progress Notes (Signed)
2 Days Post-Op  Subjective: Patient states his pain is under better control. He still has some anxiety issues.   Objective: Vital signs in last 24 hours: Temp:  [98 F (36.7 C)-100.2 F (37.9 C)] 100.2 F (37.9 C) (05/24 0600) Pulse Rate:  [108-122] 121 (05/24 0600) Resp:  [18-22] 22 (05/24 0600) BP: (165-177)/(91-100) 165/97 mmHg (05/24 0600) SpO2:  [93 %-97 %] 93 % (05/24 0600) Last BM Date: 04/14/16  Intake/Output from previous day: 05/23 0701 - 05/24 0700 In: 3025 [I.V.:2875; IV Piggyback:150] Out: 4650 [Urine:1550; Emesis/NG output:3100] Intake/Output this shift: Total I/O In: 50 [IV Piggyback:50] Out: -   General appearance: alert, cooperative and Anxious Resp: clear to auscultation bilaterally Cardio: regular rate and rhythm, S1, S2 normal, no murmur, click, rub or gallop GI: Soft. Bowel sounds appreciated, though limited. Incision healing well. JP drainage serosanguineous in nature.  Lab Results:   Recent Labs  04/15/16 0730 04/15/16 2358  WBC 18.3* 17.2*  HGB 14.3 13.9  HCT 43.3 42.0  PLT 236 232   BMET  Recent Labs  04/15/16 0730 04/15/16 2358  NA 141 138  K 4.2 3.6  CL 105 103  CO2 31 26  GLUCOSE 115* 109*  BUN 15 12  CREATININE 0.94 0.90  CALCIUM 8.7* 8.5*   PT/INR No results for input(s): LABPROT, INR in the last 72 hours.  Studies/Results: No results found.  Anti-infectives: Anti-infectives    Start     Dose/Rate Route Frequency Ordered Stop   04/14/16 1700  piperacillin-tazobactam (ZOSYN) IVPB 3.375 g     3.375 g 12.5 mL/hr over 240 Minutes Intravenous Every 8 hours 04/14/16 1404     04/14/16 1115  ceFAZolin (ANCEF) IVPB 2g/100 mL premix     2 g 200 mL/hr over 30 Minutes Intravenous On call to O.R. 04/14/16 1100 04/14/16 1134   04/14/16 0915  piperacillin-tazobactam (ZOSYN) IVPB 3.375 g     3.375 g 100 mL/hr over 30 Minutes Intravenous  Once 04/14/16 0900 04/14/16 1037      Assessment/Plan: s/p  Procedure(s): GASTRORRHAPHY Impression: Stable on postoperative day 2. Leukocytosis slowly resolving. Patient's pain under better control. No evidence of DTs. Adjust IV fluids.  LOS: 2 days    Tylia Ewell A 04/16/2016

## 2016-04-17 ENCOUNTER — Inpatient Hospital Stay (HOSPITAL_COMMUNITY): Payer: BLUE CROSS/BLUE SHIELD

## 2016-04-17 LAB — CBC
HEMATOCRIT: 44.4 % (ref 39.0–52.0)
HEMOGLOBIN: 14.7 g/dL (ref 13.0–17.0)
MCH: 32.3 pg (ref 26.0–34.0)
MCHC: 33.1 g/dL (ref 30.0–36.0)
MCV: 97.6 fL (ref 78.0–100.0)
PLATELETS: 280 10*3/uL (ref 150–400)
RBC: 4.55 MIL/uL (ref 4.22–5.81)
RDW: 13.2 % (ref 11.5–15.5)
WBC: 22.2 10*3/uL — ABNORMAL HIGH (ref 4.0–10.5)

## 2016-04-17 LAB — BASIC METABOLIC PANEL
Anion gap: 6 (ref 5–15)
BUN: 8 mg/dL (ref 6–20)
CHLORIDE: 102 mmol/L (ref 101–111)
CO2: 29 mmol/L (ref 22–32)
CREATININE: 0.79 mg/dL (ref 0.61–1.24)
Calcium: 8.5 mg/dL — ABNORMAL LOW (ref 8.9–10.3)
GFR calc non Af Amer: >60 mL/min (ref >60)
Glucose, Bld: 129 mg/dL — ABNORMAL HIGH (ref 65–99)
POTASSIUM: 3.3 mmol/L — AB (ref 3.5–5.1)
Sodium: 137 mmol/L (ref 135–145)

## 2016-04-17 MED ORDER — VENLAFAXINE HCL ER 75 MG PO CP24
150.0000 mg | ORAL_CAPSULE | Freq: Every day | ORAL | Status: DC
  Administered 2016-04-17 – 2016-04-19 (×3): 150 mg via ORAL
  Filled 2016-04-17 (×3): qty 2

## 2016-04-17 MED ORDER — METOPROLOL TARTRATE 5 MG/5ML IV SOLN: 2.5000 mg | Freq: Four times a day (QID) | INTRAVENOUS | Status: DC | PRN

## 2016-04-17 MED ORDER — GUAIFENESIN ER 600 MG PO TB12
600.0000 mg | ORAL_TABLET | Freq: Two times a day (BID) | ORAL | Status: DC
  Administered 2016-04-17 – 2016-04-19 (×5): 600 mg via ORAL
  Filled 2016-04-17 (×5): qty 1

## 2016-04-17 MED ORDER — ATENOLOL 25 MG PO TABS
50.0000 mg | ORAL_TABLET | Freq: Every day | ORAL | Status: DC
  Administered 2016-04-17 – 2016-04-19 (×3): 50 mg via ORAL
  Filled 2016-04-17 (×3): qty 2

## 2016-04-17 MED ORDER — METOPROLOL TARTRATE 5 MG/5ML IV SOLN
2.5000 mg | Freq: Four times a day (QID) | INTRAVENOUS | Status: DC
  Administered 2016-04-17: 2.5 mg via INTRAVENOUS
  Filled 2016-04-17: qty 5

## 2016-04-17 MED ORDER — LORAZEPAM 2 MG/ML IJ SOLN
1.0000 mg | INTRAMUSCULAR | Status: DC | PRN
  Administered 2016-04-17: 1 mg via INTRAVENOUS
  Filled 2016-04-17: qty 1

## 2016-04-17 MED ORDER — GUAIFENESIN 100 MG/5ML PO SOLN
10.0000 mL | ORAL | Status: DC | PRN
  Administered 2016-04-17 – 2016-04-18 (×6): 200 mg via ORAL
  Filled 2016-04-17: qty 5
  Filled 2016-04-17 (×2): qty 10
  Filled 2016-04-17: qty 5
  Filled 2016-04-17 (×3): qty 10

## 2016-04-17 MED ORDER — LOSARTAN POTASSIUM 50 MG PO TABS
50.0000 mg | ORAL_TABLET | Freq: Every day | ORAL | Status: DC
  Administered 2016-04-17 – 2016-04-19 (×3): 50 mg via ORAL
  Filled 2016-04-17 (×3): qty 1

## 2016-04-17 MED ORDER — CLONAZEPAM 0.5 MG PO TABS
0.5000 mg | ORAL_TABLET | Freq: Two times a day (BID) | ORAL | Status: DC | PRN
  Administered 2016-04-17 (×2): 0.5 mg via ORAL
  Filled 2016-04-17 (×2): qty 1

## 2016-04-17 NOTE — Progress Notes (Signed)
3 Days Post-Op  Subjective: Very anxious.  States he has had a bowel movement. Abdominal pain less. Complains more of throat pain.  Objective: Vital signs in last 24 hours: Temp:  [98.3 F (36.8 C)-98.9 F (37.2 C)] 98.3 F (36.8 C) (05/25 0623) Pulse Rate:  [122-138] 122 (05/25 0623) Resp:  [20] 20 (05/25 0623) BP: (160-176)/(97-114) 160/114 mmHg (05/25 0623) SpO2:  [95 %-98 %] 98 % (05/25 0623) Last BM Date: 04/14/16  Intake/Output from previous day: 05/24 0701 - 05/25 0700 In: 2117.1 [I.V.:1967.1; IV Piggyback:150] Out: [id number] [Urine:700; Emesis/NG output:1000; Drains:12] Intake/Output this shift: Total I/O In: -  Out: 200 [Emesis/NG output:200]  General appearance: alert, cooperative and no distress Resp: clear to auscultation bilaterally Cardio: Sinus tachycardia without S3, S4, murmurs. GI: Soft. Incision healing well. No rigidity noted. Less tenderness noted. Nondistended.  Lab Results:   Recent Labs  04/15/16 2358 04/17/16 0616  WBC 17.2* 22.2*  HGB 13.9 14.7  HCT 42.0 44.4  PLT 232 280   BMET  Recent Labs  04/15/16 2358 04/17/16 0616  NA 138 137  K 3.6 3.3*  CL 103 102  CO2 26 29  GLUCOSE 109* 129*  BUN 12 8  CREATININE 0.90 0.79  CALCIUM 8.5* 8.5*   PT/INR No results for input(s): LABPROT, INR in the last 72 hours.  Studies/Results: No results found.  Anti-infectives: Anti-infectives    Start     Dose/Rate Route Frequency Ordered Stop   04/14/16 1700  piperacillin-tazobactam (ZOSYN) IVPB 3.375 g     3.375 g 12.5 mL/hr over 240 Minutes Intravenous Every 8 hours 04/14/16 1404     04/14/16 1115  ceFAZolin (ANCEF) IVPB 2g/100 mL premix     2 g 200 mL/hr over 30 Minutes Intravenous On call to O.R. 04/14/16 1100 04/14/16 1134   04/14/16 0915  piperacillin-tazobactam (ZOSYN) IVPB 3.375 g     3.375 g 100 mL/hr over 30 Minutes Intravenous  Once 04/14/16 0900 04/14/16 1037      Assessment/Plan: s/p Procedure(s): GASTRORRHAPHY Impression:  Stable on postoperative day 3. We'll remove NG tube. Suspect sinus tachycardia secondary to anxiety. We will restart beta blocker. Will get chest x-ray to assess persistent leukocytosis, though this may be secondary to his peritonitis.  LOS: 3 days    Renatha Rosen A 04/17/2016

## 2016-04-18 LAB — BASIC METABOLIC PANEL
ANION GAP: 7 (ref 5–15)
BUN: 10 mg/dL (ref 6–20)
CALCIUM: 8.4 mg/dL — AB (ref 8.9–10.3)
CO2: 26 mmol/L (ref 22–32)
Chloride: 103 mmol/L (ref 101–111)
Creatinine, Ser: 0.8 mg/dL (ref 0.61–1.24)
GLUCOSE: 98 mg/dL (ref 65–99)
POTASSIUM: 3.4 mmol/L — AB (ref 3.5–5.1)
Sodium: 136 mmol/L (ref 135–145)

## 2016-04-18 LAB — CBC
HEMATOCRIT: 41.1 % (ref 39.0–52.0)
HEMOGLOBIN: 13.6 g/dL (ref 13.0–17.0)
MCH: 32 pg (ref 26.0–34.0)
MCHC: 33.1 g/dL (ref 30.0–36.0)
MCV: 96.7 fL (ref 78.0–100.0)
Platelets: 306 10*3/uL (ref 150–400)
RBC: 4.25 MIL/uL (ref 4.22–5.81)
RDW: 13.3 % (ref 11.5–15.5)
WBC: 18.1 10*3/uL — ABNORMAL HIGH (ref 4.0–10.5)

## 2016-04-18 MED ORDER — POTASSIUM CHLORIDE 10 MEQ/100ML IV SOLN
10.0000 meq | INTRAVENOUS | Status: DC
  Administered 2016-04-18: 10 meq via INTRAVENOUS
  Filled 2016-04-18 (×3): qty 100

## 2016-04-18 NOTE — Progress Notes (Signed)
4 Days Post-Op  Subjective: Patient feels better. He has less short of breath. Minimal incisional pain. Tolerating clear liquid diet well.  Objective: Vital signs in last 24 hours: Temp:  [98.1 F (36.7 C)-98.9 F (37.2 C)] 98.8 F (37.1 C) (05/26 0433) Pulse Rate:  [87-94] 94 (05/26 0433) Resp:  [20] 20 (05/26 0028) BP: (127-159)/(78-98) 152/98 mmHg (05/26 0433) SpO2:  [90 %-96 %] 96 % (05/26 0433) Last BM Date: 04/18/16 (per pt)  Intake/Output from previous day: 05/25 0701 - 05/26 0700 In: 835.4 [P.O.:240; I.V.:545.4; IV Piggyback:50] Out: 415 [Urine:200; Emesis/NG output:200; Drains:15] Intake/Output this shift: Total I/O In: -  Out: 450 [Urine:450]  General appearance: alert, cooperative and no distress Resp: clear to auscultation bilaterally Cardio: regular rate and rhythm, S1, S2 normal, no murmur, click, rub or gallop GI: Soft, incision healing well. JP drainage minimal and serosanguineous in nature.  Lab Results:   Recent Labs  04/17/16 0616 04/18/16 0434  WBC 22.2* 18.1*  HGB 14.7 13.6  HCT 44.4 41.1  PLT 280 306   BMET  Recent Labs  04/17/16 0616 04/18/16 0434  NA 137 136  K 3.3* 3.4*  CL 102 103  CO2 29 26  GLUCOSE 129* 98  BUN 8 10  CREATININE 0.79 0.80  CALCIUM 8.5* 8.4*   PT/INR No results for input(s): LABPROT, INR in the last 72 hours.  Studies/Results: Dg Chest Port 1 View  04/17/2016  CLINICAL DATA:  COUGH, RECENT RUPTURED ULCER SURGERY EXAM: PORTABLE CHEST 1 VIEW COMPARISON:  04/14/2016 FINDINGS: There is elevation of the right hemidiaphragm. Patchy density is identified at the right lung base, consistent with infiltrate, new since prior study. Left lung is essentially clear. Shallow lung inflation. IMPRESSION: Interval development of right lower lobe infiltrate. Electronically Signed   By: Nolon Nations M.D.   On: 04/17/2016 09:18    Anti-infectives: Anti-infectives    Start     Dose/Rate Route Frequency Ordered Stop   04/14/16  1700  piperacillin-tazobactam (ZOSYN) IVPB 3.375 g     3.375 g 12.5 mL/hr over 240 Minutes Intravenous Every 8 hours 04/14/16 1404     04/14/16 1115  ceFAZolin (ANCEF) IVPB 2g/100 mL premix     2 g 200 mL/hr over 30 Minutes Intravenous On call to O.R. 04/14/16 1100 04/14/16 1134   04/14/16 0915  piperacillin-tazobactam (ZOSYN) IVPB 3.375 g     3.375 g 100 mL/hr over 30 Minutes Intravenous  Once 04/14/16 0900 04/14/16 1037      Assessment/Plan: s/p Procedure(s): GASTRORRHAPHY Impression: Stable on postoperative day 4. Mild hypokalemia which will be addressed. Will advance diet as tolerated. Leukocytosis improving. Suspect this was secondary to atelectasis. Patient encouraged to use incentive spirometry.  LOS: 4 days    Loda Bialas A 04/18/2016

## 2016-04-19 LAB — BASIC METABOLIC PANEL
Anion gap: 9 (ref 5–15)
BUN: 9 mg/dL (ref 6–20)
CO2: 27 mmol/L (ref 22–32)
CREATININE: 0.8 mg/dL (ref 0.61–1.24)
Calcium: 8.2 mg/dL — ABNORMAL LOW (ref 8.9–10.3)
Chloride: 100 mmol/L — ABNORMAL LOW (ref 101–111)
GFR calc Af Amer: >60 mL/min (ref >60)
GLUCOSE: 90 mg/dL (ref 65–99)
POTASSIUM: 3.4 mmol/L — AB (ref 3.5–5.1)
Sodium: 136 mmol/L (ref 135–145)

## 2016-04-19 LAB — CBC
HEMATOCRIT: 39.8 % (ref 39.0–52.0)
Hemoglobin: 13.1 g/dL (ref 13.0–17.0)
MCH: 31.4 pg (ref 26.0–34.0)
MCHC: 32.9 g/dL (ref 30.0–36.0)
MCV: 95.4 fL (ref 78.0–100.0)
PLATELETS: 317 10*3/uL (ref 150–400)
RBC: 4.17 MIL/uL — ABNORMAL LOW (ref 4.22–5.81)
RDW: 13 % (ref 11.5–15.5)
WBC: 12.6 10*3/uL — AB (ref 4.0–10.5)

## 2016-04-19 MED ORDER — CIPROFLOXACIN HCL 500 MG PO TABS: 500.0000 mg | ORAL_TABLET | Freq: Two times a day (BID) | ORAL | Status: DC

## 2016-04-19 MED ORDER — PANTOPRAZOLE SODIUM 40 MG PO TBEC: 40.0000 mg | DELAYED_RELEASE_TABLET | Freq: Two times a day (BID) | ORAL | Status: DC

## 2016-04-19 MED ORDER — OXYCODONE-ACETAMINOPHEN 7.5-325 MG PO TABS: 1.0000 | ORAL_TABLET | ORAL | Status: DC | PRN

## 2016-04-19 NOTE — Discharge Summary (Signed)
Physician Discharge Summary  Patient ID: [name] MRN: [id number] DOB/AGE: 1966-11-02 50 y.o.  Admit date: 04/14/2016 Discharge date: 04/19/2016  Admission Diagnoses:Perforated peptic ulcer  Discharge Diagnoses: Same Active Problems:   Peptic ulcer, acute with perforation ([place]) Anxiety disorder, hypertension  Discharged Condition: good  Hospital Course: Patient is a 50 year old white male who presented emergency room with worsening upper abdominal pain. He was found on CT scan the abdomen to have a perforated viscus. He was taken to the operating room urgently and underwent a gastrorrhaphy, Graham plication. A small perforation due to a peptic ulcer was noted at the pylorus. He tolerated the surgery well. His postoperative course was remarkable for moderate to severe incisional pain with mild atelectasis in the right lower lung base. H. pylori was negative. Aggressive pulmonary toilet was performed. His leukocytosis subsequently started to normalize. His NG tube was removed on postoperative day 3 and his diet was advanced without difficulty. The JP drain was removed on 04/19/2016. He is being discharged home on 04/19/2016 in good and improving condition. He was instructed to not take [facility] powders as this was the cause of his perforated ulcer.  Treatments: surgery: Chauncey Cruel plication on [id number]  Discharge Exam: Blood pressure 137/85, pulse 87, temperature 99.7 F (37.6 C), temperature source Oral, resp. rate 20, height 5\' 8"  (1.727 m), weight 103.556 kg (228 lb 4.8 oz), SpO2 94 %. General appearance: alert, cooperative and no distress Head: Normocephalic, without obvious abnormality, atraumatic Resp: clear to auscultation bilaterally Cardio: regular rate and rhythm, S1, S2 normal, no murmur, click, rub or gallop GI: Soft, incision healing well. Bowel sounds active.  Disposition: 01-Home or Self Care     Medication List    TAKE these medications        atenolol 50 MG tablet  Commonly known as:  TENORMIN  TAKE 1 TABLET BY MOUTH EVERY DAY     ciprofloxacin 500 MG tablet  Commonly known as:  CIPRO  Take 1 tablet (500 mg total) by mouth 2 (two) times daily.     clonazePAM 0.5 MG tablet  Commonly known as:  KLONOPIN  TAKE 1 TABLET BY MOUTH TWICE A DAY AS NEEDED (MUST LAST 30 DAYS)     cyclobenzaprine 10 MG tablet  Commonly known as:  FLEXERIL  TAKE 1 TABLET BY MOUTH AT BEDTIME AS NEEDED     fluticasone 50 MCG/ACT nasal spray  Commonly known as:  FLONASE  Place 2 sprays into both nostrils daily.     losartan-hydrochlorothiazide 50-12.5 MG tablet  Commonly known as:  HYZAAR  Take 1 tablet by mouth daily.     NASAL SALINE NA  Place 1 spray into the nose daily as needed. For nose bleeds     omeprazole 40 MG capsule  Commonly known as:  PRILOSEC  Take 1 capsule by mouth daily.     oxyCODONE-acetaminophen 7.5-325 MG tablet  Commonly known as:  PERCOCET  Take 1-2 tablets by mouth every 4 (four) hours as needed.     pantoprazole 40 MG tablet  Commonly known as:  PROTONIX  Take 1 tablet (40 mg total) by mouth 2 (two) times daily.     simvastatin 40 MG tablet  Commonly known as:  ZOCOR  TAKE 1 TABLET BY MOUTH EVERY DAY     venlafaxine XR 150 MG 24 hr capsule  Commonly known as:  EFFEXOR-XR  TAKE ONE CAPSULE IN THE MORNING           Follow-up Information  Follow up with Jamesetta So, MD. Schedule an appointment as soon as possible for a visit on 04/24/2016.   Specialty:  General Surgery   Contact information:   [address] [place] [phone]       Signed: Aviva Signs A 04/19/2016, 9:34 AM

## 2016-04-19 NOTE — Progress Notes (Signed)
Pt IV and telemetry removed, tolerated well.  Reviewed discharge instructions with pt and answered all questions at this time.

## 2016-04-19 NOTE — Discharge Instructions (Signed)
Peptic Ulcer ° °A peptic ulcer is a sore in the lining of your esophagus (esophageal ulcer), stomach (gastric ulcer), or in the first part of your small intestine (duodenal ulcer). The ulcer causes erosion into the deeper tissue. °CAUSES  °Normally, the lining of the stomach and the small intestine protects itself from the acid that digests food. The protective lining can be damaged by: °· An infection caused by a bacterium called Helicobacter pylori (H. pylori). °· Regular use of nonsteroidal anti-inflammatory drugs (NSAIDs), such as ibuprofen or aspirin. °· Smoking tobacco. °Other risk factors include being older than 50, drinking alcohol excessively, and having a family history of ulcer disease.  °SYMPTOMS  °· Burning pain or gnawing in the area between the chest and the belly button. °· Heartburn. °· Nausea and vomiting. °· Bloating. °The pain can be worse on an empty stomach and at night. If the ulcer results in bleeding, it can cause: °· Black, tarry stools. °· Vomiting of bright red blood. °· Vomiting of coffee-ground-looking materials. °DIAGNOSIS  °A diagnosis is usually made based upon your history and an exam. Other tests and procedures may be performed to find the cause of the ulcer. Finding a cause will help determine the best treatment. Tests and procedures may include: °· Blood tests, stool tests, or breath tests to check for the bacterium H. pylori. °· An upper gastrointestinal (GI) series of the esophagus, stomach, and small intestine. °· An endoscopy to examine the esophagus, stomach, and small intestine. °· A biopsy. °TREATMENT  °Treatment may include: °· Eliminating the cause of the ulcer, such as smoking, NSAIDs, or alcohol. °· Medicines to reduce the amount of acid in your digestive tract. °· Antibiotic medicines if the ulcer is caused by the H. pylori bacterium. °· An upper endoscopy to treat a bleeding ulcer. °· Surgery if the bleeding is severe or if the ulcer created a hole somewhere in the  digestive system. °HOME CARE INSTRUCTIONS  °· Avoid tobacco, alcohol, and caffeine. Smoking can increase the acid in the stomach, and continued smoking will impair the healing of ulcers. °· Avoid foods and drinks that seem to cause discomfort or aggravate your ulcer. °· Only take medicines as directed by your caregiver. Do not substitute over-the-counter medicines for prescription medicines without talking to your caregiver. °· Keep any follow-up appointments and tests as directed. °SEEK MEDICAL CARE IF:  °· Your do not improve within 7 days of starting treatment. °· You have ongoing indigestion or heartburn. °SEEK IMMEDIATE MEDICAL CARE IF:  °· You have sudden, sharp, or persistent abdominal pain. °· You have bloody or dark black, tarry stools. °· You vomit blood or vomit that looks like coffee grounds. °· You become light-headed, weak, or feel faint. °· You become sweaty or clammy. °MAKE SURE YOU:  °· Understand these instructions. °· Will watch your condition. °· Will get help right away if you are not doing well or get worse. °  °This information is not intended to replace advice given to you by your health care provider. Make sure you discuss any questions you have with your health care provider. °  °Document Released: 11/07/2000 Document Revised: 12/01/2014 Document Reviewed: 06/09/2012 °Elsevier Interactive Patient Education ©2016 [employer] ° °

## 2016-05-16 ENCOUNTER — Other Ambulatory Visit: Payer: Self-pay | Admitting: Family Medicine

## 2016-05-16 NOTE — Telephone Encounter (Signed)
Ok to refill 

## 2016-05-16 NOTE — Telephone Encounter (Signed)
Prescription sent to pharmacy.

## 2016-05-16 NOTE — Telephone Encounter (Signed)
ok 

## 2016-05-16 NOTE — Telephone Encounter (Signed)
To MD

## 2016-06-06 ENCOUNTER — Telehealth: Payer: Self-pay | Admitting: Family Medicine

## 2016-06-06 NOTE — Telephone Encounter (Signed)
Pt called ad states that he restarted his hyzaar 50/12.5 about a week ago b/c his bp was elevated. He checked it this morning and it was 168/114 and he is wanting to know what to do about it?   Per [facility] may need more time to get into system - wait at least until over the weekend and recheck and if still high next week then double Hyzaar and follow up in 2 weeks.   Patient aware of providers recommendations and appt made

## 2016-06-16 ENCOUNTER — Telehealth: Payer: Self-pay | Admitting: Family Medicine

## 2016-06-16 NOTE — Telephone Encounter (Signed)
Patient calling to talk with you regarding his high blood pressure please call him at [phone]

## 2016-06-16 NOTE — Telephone Encounter (Signed)
Spoke to pt and he is taking his Losartan / HCTZ bid and his BP's are 140-150 / 90-100. Told pt to try taking 2 tabs po qam and check BP's and if still high we can get him a sooner appt as he does have an appt on 06/27/16.

## 2016-06-23 ENCOUNTER — Other Ambulatory Visit: Payer: Self-pay | Admitting: Neurosurgery

## 2016-06-23 DIAGNOSIS — M47816 Spondylosis without myelopathy or radiculopathy, lumbar region: Secondary | ICD-10-CM

## 2016-06-27 ENCOUNTER — Ambulatory Visit (INDEPENDENT_AMBULATORY_CARE_PROVIDER_SITE_OTHER): Payer: BLUE CROSS/BLUE SHIELD | Admitting: Family Medicine

## 2016-06-27 ENCOUNTER — Encounter: Payer: Self-pay | Admitting: Family Medicine

## 2016-06-27 VITALS — BP 136/84 | HR 84 | Temp 98.2°F | Resp 18 | Ht 68.0 in | Wt 204.0 lb

## 2016-06-27 DIAGNOSIS — L723 Sebaceous cyst: Secondary | ICD-10-CM

## 2016-06-27 DIAGNOSIS — I10 Essential (primary) hypertension: Secondary | ICD-10-CM | POA: Diagnosis not present

## 2016-06-27 MED ORDER — ATENOLOL 100 MG PO TABS: 100.0000 mg | ORAL_TABLET | Freq: Every day | ORAL | 3 refills | Status: DC

## 2016-06-27 NOTE — Progress Notes (Signed)
Subjective:    Patient ID: [name], male    DOB: [birth year], 50 y.o.   MRN: [id number]  HPI  Patient's blood pressure has consistently been greater than [id number] systolic at home. He is taking his losartan hydrochlorothiazide in addition to his atenolol every day. He denies any chest pain shortness of breath or dyspnea on exertion. He does have a "boil" on his lower left back. Examination today reveals a 1 cm inflamed irritated sebaceous cyst. Past Medical History:  Diagnosis Date  . Allergy   . Colonic diverticular abscess 12/26/2014  . Diverticulitis   . ETOH abuse   . GAD (generalized anxiety disorder)   . GERD (gastroesophageal reflux disease)   . Hypertension    Past Surgical History:  Procedure Laterality Date  . APPENDECTOMY N/A 01/01/2015   Procedure: INCIDENTAL APPENDECTOMY;  Surgeon: Jamesetta So, MD;  Location: [facility];  Service: General;  Laterality: N/A;  . GASTRECTOMY N/A 04/14/2016   Procedure: Toniann Ket;  Surgeon: Aviva Signs, MD;  Location: [facility];  Service: General;  Laterality: N/A;  . HEMORRHOID SURGERY    . HERNIA REPAIR    . PARTIAL COLECTOMY N/A 01/01/2015   Procedure: PARTIAL COLECTOMY;  Surgeon: Jamesetta So, MD;  Location: [facility];  Service: General;  Laterality: N/A;  . ROTATOR CUFF REPAIR     Current Outpatient Prescriptions on File Prior to Visit  Medication Sig Dispense Refill  . atenolol (TENORMIN) 50 MG tablet TAKE 1 TABLET BY MOUTH EVERY DAY 90 tablet 1  . clonazePAM (KLONOPIN) 0.5 MG tablet TAKE 1 TABLET BY MOUTH TWICE A DAY AS NEEDED (MUST LAST 30 DAYS) 30 tablet 2  . cyclobenzaprine (FLEXERIL) 10 MG tablet TAKE 1 TABLET BY MOUTH AT BEDTIME AS NEEDED 30 tablet 2  . fluticasone (FLONASE) 50 MCG/ACT nasal spray Place 2 sprays into both nostrils daily. 16 g 6  . losartan-hydrochlorothiazide (HYZAAR) 50-12.5 MG tablet Take 2 tablets by mouth daily.   3  . NASAL SALINE NA Place 1 spray into the nose daily as needed. For nose bleeds    .  omeprazole (PRILOSEC) 40 MG capsule Take 1 capsule by mouth daily.  3  . pantoprazole (PROTONIX) 40 MG tablet Take 1 tablet (40 mg total) by mouth 2 (two) times daily. 60 tablet 1  . simvastatin (ZOCOR) 40 MG tablet TAKE 1 TABLET BY MOUTH EVERY DAY 90 tablet 3  . venlafaxine XR (EFFEXOR-XR) 150 MG 24 hr capsule TAKE ONE CAPSULE IN THE MORNING 90 capsule 1   No current facility-administered medications on file prior to visit.    No Known Allergies Social History   Social History  . Marital status: Divorced    Spouse name: N/A  . Number of children: N/A  . Years of education: N/A   Occupational History  . Not on file.   Social History Main Topics  . Smoking status: Current Every Day Smoker    Packs/day: 1.00    Types: Cigarettes  . Smokeless tobacco: Never Used  . Alcohol use 2.4 oz/week    4 Cans of beer per week     Comment: patient states "about 5 or 6 a day" (beers)  . Drug use: No  . Sexual activity: Yes   Other Topics Concern  . Not on file   Social History Narrative  . No narrative on file      Review of Systems  All other systems reviewed and are negative.      Objective:  Physical Exam  Cardiovascular: Normal rate and regular rhythm.   Pulmonary/Chest: Effort normal and breath sounds normal.  Abdominal: Soft. Bowel sounds are normal.  Skin: Skin is warm. There is erythema.          Assessment & Plan:Regarding his blood pressure, I will increase his atenolol to 100 mg a day and recheck his blood pressure in one month. I anesthetized this inflamed sebaceous cyst was 0.1% lidocaine with epinephrine. I made a horizontal incision and expressed the cyst sac contents. I then used a pair hemostats to grab the cyst sac and using a scalpel I bluntly dissected the cyst sac from the underlying skin and fascia and removed it from the incision using gentle traction. There were no complications. The wound was left open to close by secondary intention and packed with 1  inch of gauze. Wound care was discussed

## 2016-06-30 ENCOUNTER — Ambulatory Visit
Admission: RE | Admit: 2016-06-30 | Discharge: 2016-06-30 | Disposition: A | Discharge status: Home / Self Care | Payer: BLUE CROSS/BLUE SHIELD | Source: Ambulatory Visit | Attending: Neurosurgery | Admitting: Neurosurgery

## 2016-06-30 DIAGNOSIS — M47816 Spondylosis without myelopathy or radiculopathy, lumbar region: Secondary | ICD-10-CM

## 2016-06-30 MED ORDER — IOPAMIDOL (ISOVUE-M 200) INJECTION 41%
1.0000 mL | Freq: Once | INTRAMUSCULAR | Status: AC
  Administered 2016-06-30: 1 mL via INTRA_ARTICULAR

## 2016-06-30 MED ORDER — METHYLPREDNISOLONE ACETATE 40 MG/ML INJ SUSP (RADIOLOG
120.0000 mg | Freq: Once | INTRAMUSCULAR | Status: AC
  Administered 2016-06-30: 120 mg via INTRA_ARTICULAR

## 2016-06-30 NOTE — Discharge Instructions (Signed)

## 2016-07-16 ENCOUNTER — Other Ambulatory Visit: Payer: Self-pay | Admitting: Family Medicine

## 2016-07-17 NOTE — Telephone Encounter (Signed)
Ok to refill??  Last office visit 06/27/2016.  Last refill 04/10/2016.

## 2016-07-18 NOTE — Telephone Encounter (Signed)
ok 

## 2016-07-18 NOTE — Telephone Encounter (Signed)
Medication called to pharmacy. 

## 2016-08-01 ENCOUNTER — Other Ambulatory Visit: Payer: Self-pay | Admitting: Family Medicine

## 2016-08-05 ENCOUNTER — Ambulatory Visit (INDEPENDENT_AMBULATORY_CARE_PROVIDER_SITE_OTHER): Payer: BLUE CROSS/BLUE SHIELD | Admitting: Family Medicine

## 2016-08-05 ENCOUNTER — Encounter: Payer: Self-pay | Admitting: Family Medicine

## 2016-08-05 VITALS — BP 150/100 | HR 80 | Temp 98.8°F | Resp 18 | Ht 68.0 in | Wt 202.0 lb

## 2016-08-05 DIAGNOSIS — I10 Essential (primary) hypertension: Secondary | ICD-10-CM

## 2016-08-05 DIAGNOSIS — J208 Acute bronchitis due to other specified organisms: Secondary | ICD-10-CM | POA: Diagnosis not present

## 2016-08-05 MED ORDER — AZITHROMYCIN 250 MG PO TABS: ORAL_TABLET | ORAL | 0 refills | Status: DC

## 2016-08-05 MED ORDER — METHYLPREDNISOLONE ACETATE 40 MG/ML IJ SUSP: 60.0000 mg | Freq: Once | INTRAMUSCULAR | Status: DC

## 2016-08-05 MED ORDER — AMLODIPINE BESYLATE 10 MG PO TABS: ORAL_TABLET | ORAL | 3 refills | Status: DC

## 2016-08-05 MED ORDER — METHYLPREDNISOLONE ACETATE 40 MG/ML IJ SUSP
40.0000 mg | Freq: Once | INTRAMUSCULAR | Status: AC
  Administered 2016-08-05: 60 mg via INTRAMUSCULAR

## 2016-08-05 NOTE — Addendum Note (Signed)
Addended by: Shary Decamp B on: 08/05/2016 03:00 PM   Modules accepted: Orders

## 2016-08-05 NOTE — Addendum Note (Signed)
Addended by: Jenna Luo on: 08/05/2016 02:56 PM   Modules accepted: Orders

## 2016-08-05 NOTE — Progress Notes (Signed)
Subjective:    Patient ID: [name], male    DOB: 11/06/1966, 50 y.o.   MRN: [id number]  HPI  [name] with a cough that has been gradually worsening for more than a week. Cough is productive of brown sputum. He is also developing some shortness of breath and chest congestion. He is a smoker. He denies any severe pleurisy. He denies any high fevers. He does report sinus pain and sinus pressure. He reports a sinus headache. He has significant nasal congestion. He denies any sore throat. He denies any otalgia Past Medical History:  Diagnosis Date  . Allergy   . Colonic diverticular abscess 12/26/2014  . Diverticulitis   . ETOH abuse   . GAD (generalized anxiety disorder)   . GERD (gastroesophageal reflux disease)   . Hypertension    Past Surgical History:  Procedure Laterality Date  . APPENDECTOMY N/A 01/01/2015   Procedure: INCIDENTAL APPENDECTOMY;  Surgeon: Jamesetta So, MD;  Location: [facility];  Service: General;  Laterality: N/A;  . GASTRECTOMY N/A 04/14/2016   Procedure: Toniann Ket;  Surgeon: Aviva Signs, MD;  Location: [facility];  Service: General;  Laterality: N/A;  . HEMORRHOID SURGERY    . HERNIA REPAIR    . PARTIAL COLECTOMY N/A 01/01/2015   Procedure: PARTIAL COLECTOMY;  Surgeon: Jamesetta So, MD;  Location: [facility];  Service: General;  Laterality: N/A;  . ROTATOR CUFF REPAIR     Current Outpatient Prescriptions on File Prior to Visit  Medication Sig Dispense Refill  . atenolol (TENORMIN) 100 MG tablet Take 1 tablet (100 mg total) by mouth daily. 90 tablet 3  . atenolol (TENORMIN) 50 MG tablet TAKE 1 TABLET BY MOUTH EVERY DAY 90 tablet 1  . clonazePAM (KLONOPIN) 0.5 MG tablet TAKE 1 TABLET BY MOUTH TWICE A DAY AS NEEDED (MUST LAST 30 DAYS) 30 tablet 2  . cyclobenzaprine (FLEXERIL) 10 MG tablet TAKE 1 TABLET BY MOUTH AT BEDTIME AS NEEDED 30 tablet 2  . fluticasone (FLONASE) 50 MCG/ACT nasal spray Place 2 sprays into both nostrils daily. 16 g 6  .  losartan-hydrochlorothiazide (HYZAAR) 50-12.5 MG tablet Take 2 tablets by mouth daily.   3  . NASAL SALINE NA Place 1 spray into the nose daily as needed. For nose bleeds    . omeprazole (PRILOSEC) 40 MG capsule Take 1 capsule by mouth daily.  3  . pantoprazole (PROTONIX) 40 MG tablet Take 1 tablet (40 mg total) by mouth 2 (two) times daily. 60 tablet 1  . simvastatin (ZOCOR) 40 MG tablet TAKE 1 TABLET BY MOUTH EVERY DAY 90 tablet 3  . venlafaxine XR (EFFEXOR-XR) 150 MG 24 hr capsule TAKE ONE CAPSULE BY MOUTH IN THE MORNING 90 capsule 1   No current facility-administered medications on file prior to visit.    No Known Allergies Social History   Social History  . Marital status: Divorced    Spouse name: N/A  . Number of children: N/A  . Years of education: N/A   Occupational History  . Not on file.   Social History Main Topics  . Smoking status: Current Every Day Smoker    Packs/day: 1.00    Types: Cigarettes  . Smokeless tobacco: Never Used  . Alcohol use 2.4 oz/week    4 Cans of beer per week     Comment: patient states "about 5 or 6 a day" (beers)  . Drug use: No  . Sexual activity: Yes   Other Topics Concern  . Not  on file   Social History Narrative  . No narrative on file      Review of Systems  All other systems reviewed and are negative.      Objective:   Physical Exam  HENT:  Right Ear: External ear normal.  Left Ear: External ear normal.  Nose: Mucosal edema and rhinorrhea present. Right sinus exhibits frontal sinus tenderness. Left sinus exhibits frontal sinus tenderness.  Mouth/Throat: Oropharynx is clear and moist. No oropharyngeal exudate.  Cardiovascular: Normal rate, regular rhythm, normal heart sounds and intact distal pulses.   Pulmonary/Chest: Effort normal. No respiratory distress. He has wheezes. He has no rales.  Abdominal: Soft. Bowel sounds are normal.  Skin: Skin is warm. There is erythema.          Assessment & Plan:  Acute  bronchitis due to other specified organisms - Plan: azithromycin (ZITHROMAX) 250 MG tablet  Symptoms are consistent with bronchitis in smoker and sinusitis. Begin a Z-Pak. I will give the patient 60 mg of Depo-Medrol IM 1 now. His blood pressure is significantly elevated. I have asked the patient to monitor it at home and notify me of the values in one week. If persistently elevated, would need to increase his medication. If he begins to wheeze more, I recommend putting him on albuterol 2 puffs inhaled every 6 hours. Continue recommend smoking cessation

## 2016-08-07 ENCOUNTER — Telehealth: Payer: Self-pay | Admitting: Family Medicine

## 2016-08-07 DIAGNOSIS — I10 Essential (primary) hypertension: Secondary | ICD-10-CM

## 2016-08-07 MED ORDER — ATORVASTATIN CALCIUM 40 MG PO TABS: 40.0000 mg | ORAL_TABLET | Freq: Every day | ORAL | 3 refills | Status: DC

## 2016-08-07 MED ORDER — AMLODIPINE BESYLATE 10 MG PO TABS: 10.0000 mg | ORAL_TABLET | Freq: Every day | ORAL | 3 refills | Status: DC

## 2016-08-07 NOTE — Telephone Encounter (Addendum)
Per Pharmacy there is a drug interaction with amlodipine and Simvastatin therefor Dr. Dennard Schaumann changed med to Lipitor 40mg  qd. Dc Sinvastatin. Pharmacy verbally aware and new rx's sent.

## 2016-08-11 ENCOUNTER — Telehealth: Payer: Self-pay | Admitting: Family Medicine

## 2016-08-11 NOTE — Telephone Encounter (Signed)
levaquin 500 qday for 7 days. DC atenolol and switch to cardura 4 mg poqday for htn.

## 2016-08-11 NOTE — Telephone Encounter (Signed)
Patients wife [name] calling to say he is not better from his last visit, please advise  [phone] Also bp yesterday was 171/111

## 2016-08-12 ENCOUNTER — Ambulatory Visit: Payer: BLUE CROSS/BLUE SHIELD | Admitting: Family Medicine

## 2016-08-12 MED ORDER — DOXAZOSIN MESYLATE 4 MG PO TABS
4.0000 mg | ORAL_TABLET | Freq: Every day | ORAL | 5 refills | Status: DC
Start: 2016-08-12 — End: 2016-08-26

## 2016-08-12 MED ORDER — LEVOFLOXACIN 500 MG PO TABS: 500.0000 mg | ORAL_TABLET | Freq: Every day | ORAL | 0 refills | Status: DC

## 2016-08-12 NOTE — Telephone Encounter (Signed)
Medication called/sent to requested pharmacy and pt's wife aware 

## 2016-08-22 ENCOUNTER — Ambulatory Visit (INDEPENDENT_AMBULATORY_CARE_PROVIDER_SITE_OTHER): Payer: BLUE CROSS/BLUE SHIELD | Admitting: Family Medicine

## 2016-08-22 ENCOUNTER — Encounter: Payer: Self-pay | Admitting: Family Medicine

## 2016-08-22 VITALS — BP 124/78 | HR 100 | Temp 98.1°F | Resp 18 | Ht 68.0 in | Wt 200.0 lb

## 2016-08-22 DIAGNOSIS — Z09 Encounter for follow-up examination after completed treatment for conditions other than malignant neoplasm: Secondary | ICD-10-CM

## 2016-08-22 DIAGNOSIS — I10 Essential (primary) hypertension: Secondary | ICD-10-CM

## 2016-08-22 NOTE — Progress Notes (Signed)
Subjective:    Patient ID: [name], male    DOB: 11-28-1965, 50 y.o.   MRN: [id number]  HPI Please review my recent office visits. Patient was ultimately started on amlodipine in addition to his Hyzaar for hypertension. At his last office visit, he was also started on a Z-Pak. His wife called back later and stated that he was worse. His blood pressure was also elevated. At that point I added Cardura and switch his antibiotic to Levaquin. Shortly thereafter, the patient developed severe hypotension and orthostatic dizziness. He was taken to the hospital where he was found to have very low blood pressure and was diagnosed with pneumonia. His pneumonia was treated with Levaquin. He was discharged home on Hyzaar and metoprolol. However his wife believes that he is also been taking Cardura in addition by accident. Since discharge from the hospital, he reports orthostatic dizziness, weakness, cramps. His heart rate is elevated and he is still appears dehydrated. He denies any shortness of breath. His cough has improved. He denies any pleurisy. Unfortunately he continues to smoke Past Medical History:  Diagnosis Date  . Allergy   . Colonic diverticular abscess 12/26/2014  . Diverticulitis   . ETOH abuse   . GAD (generalized anxiety disorder)   . GERD (gastroesophageal reflux disease)   . Hypertension    Past Surgical History:  Procedure Laterality Date  . APPENDECTOMY N/A 01/01/2015   Procedure: INCIDENTAL APPENDECTOMY;  Surgeon: Jamesetta So, MD;  Location: [facility];  Service: General;  Laterality: N/A;  . GASTRECTOMY N/A 04/14/2016   Procedure: Toniann Ket;  Surgeon: Aviva Signs, MD;  Location: [facility];  Service: General;  Laterality: N/A;  . HEMORRHOID SURGERY    . HERNIA REPAIR    . PARTIAL COLECTOMY N/A 01/01/2015   Procedure: PARTIAL COLECTOMY;  Surgeon: Jamesetta So, MD;  Location: [facility];  Service: General;  Laterality: N/A;  . ROTATOR CUFF REPAIR     Current Outpatient  Prescriptions on File Prior to Visit  Medication Sig Dispense Refill  . amLODipine (NORVASC) 10 MG tablet Take 1 tablet (10 mg total) by mouth daily. This replaces atenolol 90 tablet 3  . atorvastatin (LIPITOR) 40 MG tablet Take 1 tablet (40 mg total) by mouth daily. 90 tablet 3  . clonazePAM (KLONOPIN) 0.5 MG tablet TAKE 1 TABLET BY MOUTH TWICE A DAY AS NEEDED (MUST LAST 30 DAYS) 30 tablet 2  . cyclobenzaprine (FLEXERIL) 10 MG tablet TAKE 1 TABLET BY MOUTH AT BEDTIME AS NEEDED 30 tablet 2  . doxazosin (CARDURA) 4 MG tablet Take 1 tablet (4 mg total) by mouth daily. DC Atenolol 30 tablet 5  . fluticasone (FLONASE) 50 MCG/ACT nasal spray Place 2 sprays into both nostrils daily. 16 g 6  . losartan-hydrochlorothiazide (HYZAAR) 50-12.5 MG tablet Take 2 tablets by mouth daily.   3  . NASAL SALINE NA Place 1 spray into the nose daily as needed. For nose bleeds    . simvastatin (ZOCOR) 40 MG tablet TAKE 1 TABLET BY MOUTH EVERY DAY 90 tablet 3  . venlafaxine XR (EFFEXOR-XR) 150 MG 24 hr capsule TAKE ONE CAPSULE BY MOUTH IN THE MORNING 90 capsule 1   No current facility-administered medications on file prior to visit.    No Known Allergies Social History   Social History  . Marital status: Divorced    Spouse name: N/A  . Number of children: N/A  . Years of education: N/A   Occupational History  . Not on file.  Social History Main Topics  . Smoking status: Current Every Day Smoker    Packs/day: 1.00    Types: Cigarettes  . Smokeless tobacco: Never Used  . Alcohol use 2.4 oz/week    4 Cans of beer per week     Comment: patient states "about 5 or 6 a day" (beers)  . Drug use: No  . Sexual activity: Yes   Other Topics Concern  . Not on file   Social History Narrative  . No narrative on file      Review of Systems  All other systems reviewed and are negative.      Objective:   Physical Exam  Constitutional: He appears well-developed and well-nourished.  Neck: Neck supple.    Cardiovascular: Normal rate, regular rhythm and normal heart sounds.   No murmur heard. Pulmonary/Chest: Effort normal and breath sounds normal. No respiratory distress. He has no wheezes. He has no rales.  Abdominal: Soft. Bowel sounds are normal. He exhibits no distension. There is no tenderness. There is no rebound and no guarding.  Musculoskeletal: He exhibits no edema.  Lymphadenopathy:    He has no cervical adenopathy.  Vitals reviewed.         Assessment & Plan:  Hospital discharge follow-up - Plan: COMPLETE METABOLIC PANEL WITH GFR, CBC with Differential/Platelet  Benign essential HTN  I believe the patient is overmedicated by accident. I told the patient that I only want him taking Hyzaar and metoprolol. He is to discontinue Cardura. If he has not been taking Cardura, also want him to discontinue metoprolol. I told him to push fluids over the weekend. He is to be out of work until Tuesday when I reexamine him. Hopefully at that time his hypotension and dehydration will have resolved. I will check a CBC today along with a CMP. Strongly recommended smoking cessation. Recheck a chest x-ray in 4 weeks

## 2016-08-23 LAB — CBC WITH DIFFERENTIAL/PLATELET
BASOS PCT: 1 %
Basophils Absolute: 93 cells/uL (ref 0–200)
EOS PCT: 1 %
Eosinophils Absolute: 93 cells/uL (ref 15–500)
HCT: 45 % (ref 38.5–50.0)
HEMOGLOBIN: 15.3 g/dL (ref 13.0–17.0)
LYMPHS ABS: 1860 {cells}/uL (ref 850–3900)
Lymphocytes Relative: 20 %
MCH: 31.4 pg (ref 27.0–33.0)
MCHC: 34 g/dL (ref 32.0–36.0)
MCV: 92.4 fL (ref 80.0–100.0)
MONO ABS: 1116 {cells}/uL — AB (ref 200–950)
MPV: 9.5 fL (ref 7.5–12.5)
Monocytes Relative: 12 %
NEUTROS ABS: 6138 {cells}/uL (ref 1500–7800)
Neutrophils Relative %: 66 %
Platelets: 272 10*3/uL (ref 140–400)
RBC: 4.87 MIL/uL (ref 4.20–5.80)
RDW: 14.1 % (ref 11.0–15.0)
WBC: 9.3 10*3/uL (ref 3.8–10.8)

## 2016-08-23 LAB — COMPLETE METABOLIC PANEL WITH GFR
ALBUMIN: 4.5 g/dL (ref 3.6–5.1)
ALK PHOS: 75 U/L (ref 40–115)
ALT: 35 U/L (ref 9–46)
AST: 31 U/L (ref 10–35)
BUN: 17 mg/dL (ref 7–25)
CALCIUM: 9.6 mg/dL (ref 8.6–10.3)
CHLORIDE: 91 mmol/L — AB (ref 98–110)
CO2: 27 mmol/L (ref 20–31)
Creat: 0.86 mg/dL (ref 0.70–1.33)
GFR, Est African American: >89 mL/min (ref >=60)
Glucose, Bld: 140 mg/dL — ABNORMAL HIGH (ref 70–99)
POTASSIUM: 4.4 mmol/L (ref 3.5–5.3)
Sodium: 132 mmol/L — ABNORMAL LOW (ref 135–146)
Total Bilirubin: 0.3 mg/dL (ref 0.2–1.2)
Total Protein: 7.1 g/dL (ref 6.1–8.1)

## 2016-08-26 ENCOUNTER — Encounter: Payer: Self-pay | Admitting: Family Medicine

## 2016-08-26 ENCOUNTER — Ambulatory Visit (INDEPENDENT_AMBULATORY_CARE_PROVIDER_SITE_OTHER): Payer: BLUE CROSS/BLUE SHIELD | Admitting: Family Medicine

## 2016-08-26 VITALS — BP 146/88 | HR 84 | Temp 98.2°F | Resp 18 | Ht 68.0 in | Wt 206.0 lb

## 2016-08-26 DIAGNOSIS — I10 Essential (primary) hypertension: Secondary | ICD-10-CM | POA: Diagnosis not present

## 2016-08-26 DIAGNOSIS — Z09 Encounter for follow-up examination after completed treatment for conditions other than malignant neoplasm: Secondary | ICD-10-CM

## 2016-08-26 MED ORDER — AMLODIPINE BESYLATE 10 MG PO TABS: 10.0000 mg | ORAL_TABLET | Freq: Every day | ORAL | 3 refills | Status: DC

## 2016-08-26 NOTE — Progress Notes (Signed)
Subjective:    Patient ID: [name], male    DOB: 20-Nov-1966, 50 y.o.   MRN: [id number]  HPI  08/22/16 Please review my recent office visits. Patient was ultimately started on amlodipine in addition to his Hyzaar for hypertension. At his last office visit, he was also started on a Z-Pak. His wife called back later and stated that he was worse. His blood pressure was also elevated. At that point I added Cardura and switch his antibiotic to Levaquin. Shortly thereafter, the patient developed severe hypotension and orthostatic dizziness. He was taken to the hospital where he was found to have very low blood pressure and was diagnosed with pneumonia. His pneumonia was treated with Levaquin. He was discharged home on Hyzaar and metoprolol. However his wife believes that he is also been taking Cardura in addition by accident. Since discharge from the hospital, he reports orthostatic dizziness, weakness, cramps. His heart rate is elevated and he is still appears dehydrated. He denies any shortness of breath. His cough has improved. He denies any pleurisy. Unfortunately he continues to smoke.  At that time, my plan was: I believe the patient is overmedicated by accident. I told the patient that I only want him taking Hyzaar and metoprolol. He is to discontinue Cardura. If he has not been taking Cardura, also want him to discontinue metoprolol. I told him to push fluids over the weekend. He is to be out of work until Tuesday when I reexamine him. Hopefully at that time his hypotension and dehydration will have resolved. I will check a CBC today along with a CMP. Strongly recommended smoking cessation. Recheck a chest x-ray in 4 weeks  08/26/16 The patient's breathing is back to his baseline. However he still feels extremely weak. He still reports orthostatic dizziness upon standing however his blood pressure is actually high at home and is normal here today. His lab work was unremarkable that was obtained  at his last visit. I believe some of the weakness and dizziness is due to his recovery from his recent pneumonia. I believe the other orthostatic dizziness could be due to his beta blocker Past Medical History:  Diagnosis Date  . Allergy   . Colonic diverticular abscess 12/26/2014  . Diverticulitis   . ETOH abuse   . GAD (generalized anxiety disorder)   . GERD (gastroesophageal reflux disease)   . Hypertension    Past Surgical History:  Procedure Laterality Date  . APPENDECTOMY N/A 01/01/2015   Procedure: INCIDENTAL APPENDECTOMY;  Surgeon: Jamesetta So, MD;  Location: [facility];  Service: General;  Laterality: N/A;  . GASTRECTOMY N/A 04/14/2016   Procedure: Toniann Ket;  Surgeon: Aviva Signs, MD;  Location: [facility];  Service: General;  Laterality: N/A;  . HEMORRHOID SURGERY    . HERNIA REPAIR    . PARTIAL COLECTOMY N/A 01/01/2015   Procedure: PARTIAL COLECTOMY;  Surgeon: Jamesetta So, MD;  Location: [facility];  Service: General;  Laterality: N/A;  . ROTATOR CUFF REPAIR     Current Outpatient Prescriptions on File Prior to Visit  Medication Sig Dispense Refill  . atorvastatin (LIPITOR) 40 MG tablet Take 1 tablet (40 mg total) by mouth daily. 90 tablet 3  . clonazePAM (KLONOPIN) 0.5 MG tablet TAKE 1 TABLET BY MOUTH TWICE A DAY AS NEEDED (MUST LAST 30 DAYS) 30 tablet 2  . cyclobenzaprine (FLEXERIL) 10 MG tablet TAKE 1 TABLET BY MOUTH AT BEDTIME AS NEEDED 30 tablet 2  . fluticasone (FLONASE) 50 MCG/ACT nasal spray  Place 2 sprays into both nostrils daily. 16 g 6  . losartan-hydrochlorothiazide (HYZAAR) 50-12.5 MG tablet Take 2 tablets by mouth daily.   3  . NASAL SALINE NA Place 1 spray into the nose daily as needed. For nose bleeds    . pantoprazole (PROTONIX) 40 MG tablet Take 40 mg by mouth 2 (two) times daily.  1  . simvastatin (ZOCOR) 40 MG tablet TAKE 1 TABLET BY MOUTH EVERY DAY 90 tablet 3  . venlafaxine XR (EFFEXOR-XR) 150 MG 24 hr capsule TAKE ONE CAPSULE BY MOUTH IN THE MORNING 90  capsule 1   No current facility-administered medications on file prior to visit.    No Known Allergies Social History   Social History  . Marital status: Divorced    Spouse name: N/A  . Number of children: N/A  . Years of education: N/A   Occupational History  . Not on file.   Social History Main Topics  . Smoking status: Current Every Day Smoker    Packs/day: 1.00    Types: Cigarettes  . Smokeless tobacco: Never Used  . Alcohol use 2.4 oz/week    4 Cans of beer per week     Comment: patient states "about 5 or 6 a day" (beers)  . Drug use: No  . Sexual activity: Yes   Other Topics Concern  . Not on file   Social History Narrative  . No narrative on file      Review of Systems  All other systems reviewed and are negative.      Objective:   Physical Exam  Constitutional: He appears well-developed and well-nourished.  Neck: Neck supple.  Cardiovascular: Normal rate, regular rhythm and normal heart sounds.   No murmur heard. Pulmonary/Chest: Effort normal and breath sounds normal. No respiratory distress. He has no wheezes. He has no rales.  Abdominal: Soft. Bowel sounds are normal. He exhibits no distension. There is no tenderness. There is no rebound and no guarding.  Musculoskeletal: He exhibits no edema.  Lymphadenopathy:    He has no cervical adenopathy.  Vitals reviewed.         Assessment & Plan:  Benign essential HTN - Plan: amLODipine (NORVASC) 10 MG tablet  Continue Hyzaar. At this point I believe the patient should be cleared to return to work on Monday, October 9. This should help provide him adequate time to recover from his pneumonia. I believe the orthostatic dizziness is due to his beta blocker. I will have him discontinue metoprolol and resume amlodipine 10 mg a day. Recheck chest x-ray at the end of this month to ensure resolution of his pneumonia

## 2016-08-27 ENCOUNTER — Telehealth: Payer: Self-pay | Admitting: *Deleted

## 2016-08-27 DIAGNOSIS — I10 Essential (primary) hypertension: Secondary | ICD-10-CM

## 2016-08-27 MED ORDER — AMLODIPINE BESYLATE 10 MG PO TABS: 10.0000 mg | ORAL_TABLET | Freq: Every day | ORAL | 3 refills | Status: DC

## 2016-08-27 NOTE — Telephone Encounter (Signed)
Amlodipine sent to pharm

## 2016-08-27 NOTE — Telephone Encounter (Signed)
Patient called stating [facility] in [place] has not received his new blood pressure medication, patient stated he does not remember the name of the new medication. Please advise [phone]

## 2016-09-01 ENCOUNTER — Other Ambulatory Visit: Payer: Self-pay | Admitting: Family Medicine

## 2016-09-02 ENCOUNTER — Ambulatory Visit (INDEPENDENT_AMBULATORY_CARE_PROVIDER_SITE_OTHER): Payer: BLUE CROSS/BLUE SHIELD | Admitting: Family Medicine

## 2016-09-02 ENCOUNTER — Telehealth: Payer: Self-pay | Admitting: Family Medicine

## 2016-09-02 VITALS — BP 176/96 | HR 118 | Temp 98.6°F

## 2016-09-02 DIAGNOSIS — R000 Tachycardia, unspecified: Secondary | ICD-10-CM

## 2016-09-02 DIAGNOSIS — R9431 Abnormal electrocardiogram [ECG] [EKG]: Secondary | ICD-10-CM | POA: Diagnosis not present

## 2016-09-02 DIAGNOSIS — R5383 Other fatigue: Secondary | ICD-10-CM

## 2016-09-02 DIAGNOSIS — I10 Essential (primary) hypertension: Secondary | ICD-10-CM | POA: Diagnosis not present

## 2016-09-02 LAB — CBC
HCT: 48.5 % (ref 38.5–50.0)
Hemoglobin: 15.9 g/dL (ref 13.0–17.0)
MCH: 33.1 pg — AB (ref 27.0–33.0)
MCHC: 32.8 g/dL (ref 32.0–36.0)
MCV: 100.8 fL — AB (ref 80.0–100.0)
PLATELETS: 250 10*3/uL (ref 140–400)
RBC: 4.81 MIL/uL (ref 4.20–5.80)
RDW: 14.6 % (ref 11.0–15.0)
WBC: 7.7 10*3/uL (ref 3.8–10.8)

## 2016-09-02 MED ORDER — ATORVASTATIN CALCIUM 40 MG PO TABS: 40.0000 mg | ORAL_TABLET | Freq: Every day | ORAL | 3 refills | Status: DC

## 2016-09-02 MED ORDER — CARVEDILOL 12.5 MG PO TABS: 12.5000 mg | ORAL_TABLET | Freq: Two times a day (BID) | ORAL | 3 refills | Status: DC

## 2016-09-02 NOTE — Progress Notes (Signed)
Subjective:    Patient ID: [name], male    DOB: 1966/08/30, 50 y.o.   MRN: [id number]  HPI  08/22/16 Please review my recent office visits. Patient was ultimately started on amlodipine in addition to his Hyzaar for hypertension. At his last office visit, he was also started on a Z-Pak. His wife called back later and stated that he was worse. His blood pressure was also elevated. At that point I added Cardura and switch his antibiotic to Levaquin. Shortly thereafter, the patient developed severe hypotension and orthostatic dizziness. He was taken to the hospital where he was found to have very low blood pressure and was diagnosed with pneumonia. His pneumonia was treated with Levaquin. He was discharged home on Hyzaar and metoprolol. However his wife believes that he is also been taking Cardura in addition by accident. Since discharge from the hospital, he reports orthostatic dizziness, weakness, cramps. His heart rate is elevated and he is still appears dehydrated. He denies any shortness of breath. His cough has improved. He denies any pleurisy. Unfortunately he continues to smoke.  At that time, my plan was: I believe the patient is overmedicated by accident. I told the patient that I only want him taking Hyzaar and metoprolol. He is to discontinue Cardura. If he has not been taking Cardura, also want him to discontinue metoprolol. I told him to push fluids over the weekend. He is to be out of work until Tuesday when I reexamine him. Hopefully at that time his hypotension and dehydration will have resolved. I will check a CBC today along with a CMP. Strongly recommended smoking cessation. Recheck a chest x-ray in 4 weeks  08/26/16 The patient's breathing is back to his baseline. However he still feels extremely weak. He still reports orthostatic dizziness upon standing however his blood pressure is actually high at home and is normal here today. His lab work was unremarkable that was obtained  at his last visit. I believe some of the weakness and dizziness is due to his recovery from his recent pneumonia. I believe the other orthostatic dizziness could be due to his beta blocker.  At that time, my plan was: Continue Hyzaar. At this point I believe the patient should be cleared to return to work on Monday, October 9. This should help provide him adequate time to recover from his pneumonia. I believe the orthostatic dizziness is due to his beta blocker. I will have him discontinue metoprolol and resume amlodipine 10 mg a day. Recheck chest x-ray at the end of this month to ensure resolution of his pneumonia  09/02/16 The patient is worked in urgently today due to a rapid heartbeat. He called my nurse this afternoon and stated that his heart rate was as high as 160 bpm. He states that his heart rate has been racing all weekend. He continues to feel extremely lightheaded upon standing, extremely weak, and very fatigued even though he is now over a week out from his pneumonia. On examination today he is in sinus tachycardia with an elevated blood pressure despite taking amlodipine as well as Hyzaar. He is no longer on any statin medication. He is not taking any aspirin due to his recent history of a GI bleed. EKG is performed today in the office and compared to an EKG from May this year. There is now nonspecific ST depression in the inferior and lateral leads with T-wave inversions in lead 1, aVL, V5, and V6. This is a significant change when  compared to May of this year. Patient continues to smoke Past Medical History:  Diagnosis Date  . Allergy   . Colonic diverticular abscess 12/26/2014  . Diverticulitis   . ETOH abuse   . GAD (generalized anxiety disorder)   . GERD (gastroesophageal reflux disease)   . Hypertension    Past Surgical History:  Procedure Laterality Date  . APPENDECTOMY N/A 01/01/2015   Procedure: INCIDENTAL APPENDECTOMY;  Surgeon: Jamesetta So, MD;  Location: [facility];  Service:  General;  Laterality: N/A;  . GASTRECTOMY N/A 04/14/2016   Procedure: Toniann Ket;  Surgeon: Aviva Signs, MD;  Location: [facility];  Service: General;  Laterality: N/A;  . HEMORRHOID SURGERY    . HERNIA REPAIR    . PARTIAL COLECTOMY N/A 01/01/2015   Procedure: PARTIAL COLECTOMY;  Surgeon: Jamesetta So, MD;  Location: [facility];  Service: General;  Laterality: N/A;  . ROTATOR CUFF REPAIR     Current Outpatient Prescriptions on File Prior to Visit  Medication Sig Dispense Refill  . amLODipine (NORVASC) 10 MG tablet Take 1 tablet (10 mg total) by mouth daily. This replaces metoprolol 90 tablet 3  . clonazePAM (KLONOPIN) 0.5 MG tablet TAKE 1 TABLET BY MOUTH TWICE A DAY AS NEEDED (MUST LAST 30 DAYS) 30 tablet 2  . cyclobenzaprine (FLEXERIL) 10 MG tablet TAKE 1 TABLET BY MOUTH AT BEDTIME AS NEEDED 30 tablet 2  . fluticasone (FLONASE) 50 MCG/ACT nasal spray Place 2 sprays into both nostrils daily. 16 g 6  . losartan-hydrochlorothiazide (HYZAAR) 50-12.5 MG tablet Take 2 tablets by mouth daily.   3  . NASAL SALINE NA Place 1 spray into the nose daily as needed. For nose bleeds    . pantoprazole (PROTONIX) 40 MG tablet Take 40 mg by mouth 2 (two) times daily.  1  . venlafaxine XR (EFFEXOR-XR) 150 MG 24 hr capsule TAKE ONE CAPSULE BY MOUTH IN THE MORNING 90 capsule 1   No current facility-administered medications on file prior to visit.    No Known Allergies Social History   Social History  . Marital status: Divorced    Spouse name: N/A  . Number of children: N/A  . Years of education: N/A   Occupational History  . Not on file.   Social History Main Topics  . Smoking status: Current Every Day Smoker    Packs/day: 1.00    Types: Cigarettes  . Smokeless tobacco: Never Used  . Alcohol use 2.4 oz/week    4 Cans of beer per week     Comment: patient states "about 5 or 6 a day" (beers)  . Drug use: No  . Sexual activity: Yes   Other Topics Concern  . Not on file   Social History Narrative    . No narrative on file      Review of Systems  All other systems reviewed and are negative.      Objective:   Physical Exam  Constitutional: He appears well-developed and well-nourished.  Neck: Neck supple.  Cardiovascular: Normal rate, regular rhythm and normal heart sounds.   No murmur heard. Pulmonary/Chest: Effort normal and breath sounds normal. No respiratory distress. He has no wheezes. He has no rales.  Abdominal: Soft. Bowel sounds are normal. He exhibits no distension. There is no tenderness. There is no rebound and no guarding.  Musculoskeletal: He exhibits no edema.  Lymphadenopathy:    He has no cervical adenopathy.  Vitals reviewed.         Assessment & Plan:  Tachycardia - Plan: CBC, EKG 12-Lead, carvedilol (COREG) 12.5 MG tablet, atorvastatin (LIPITOR) 40 MG tablet  Benign essential HTN  Abnormal finding on EKG  Other fatigue  Given his EKG changes, his shortness of breath, and his profound fatigue, I believe the patient has coronary artery disease and likely has significant blockage in either of the left main or one of its branches. He needs urgent cardiology evaluation. At this time he is stable and is having no angina and therefore does not require hospitalization. I want him to start aspirin 81 mg a day evening given his history of GI bleed. I want him to continue amlodipine and Hyzaar. Meanwhile I will start him on carvedilol 12.5 mg by mouth twice a day for his tachycardia, presumed ASCVD, and elevated blood pressure. I want him to start Lipitor 40 mg a day. I'll try to arrange cardiology consultation ASAP. He is not to return to work. Should he develop chest pain he is to go straight to the hospital.

## 2016-09-02 NOTE — Telephone Encounter (Signed)
Pt called and states that his BP is 142/94 and his pulse is 140 when he checked it manually it was 160 - per [facility] come to office now to have checked. Pt agreed and will come now.

## 2016-09-04 ENCOUNTER — Telehealth: Payer: Self-pay | Admitting: Family Medicine

## 2016-09-04 ENCOUNTER — Ambulatory Visit: Payer: BLUE CROSS/BLUE SHIELD | Admitting: Family Medicine

## 2016-09-04 DIAGNOSIS — R000 Tachycardia, unspecified: Secondary | ICD-10-CM

## 2016-09-04 NOTE — Telephone Encounter (Signed)
Patient calling to talk with you regarding his fmla papers with work, and some dates as far as being out of work  [phone] (H

## 2016-09-05 ENCOUNTER — Encounter: Payer: Self-pay | Admitting: Family Medicine

## 2016-09-05 NOTE — Telephone Encounter (Signed)
[facility] - Needs new paperwork for FMLA

## 2016-09-09 MED ORDER — ATORVASTATIN CALCIUM 40 MG PO TABS: 40.0000 mg | ORAL_TABLET | Freq: Every day | ORAL | 3 refills | Status: DC

## 2016-09-09 NOTE — Telephone Encounter (Signed)
Information for his Disability - Claim # [id number] Fax - [phone] Attn: Prince Solian

## 2016-09-09 NOTE — Telephone Encounter (Signed)
LMOVM that we need new forms for FMLA and Lipitor was resent to pharm

## 2016-09-10 NOTE — Telephone Encounter (Signed)
Patient would like to speak to you again regarding his disability papers  [phone]

## 2016-09-12 ENCOUNTER — Ambulatory Visit (INDEPENDENT_AMBULATORY_CARE_PROVIDER_SITE_OTHER): Payer: BLUE CROSS/BLUE SHIELD | Admitting: Family Medicine

## 2016-09-12 ENCOUNTER — Encounter: Payer: Self-pay | Admitting: Family Medicine

## 2016-09-12 VITALS — BP 110/76 | HR 96 | Temp 98.0°F | Resp 18 | Ht 68.0 in | Wt 210.0 lb

## 2016-09-12 DIAGNOSIS — R000 Tachycardia, unspecified: Secondary | ICD-10-CM | POA: Diagnosis not present

## 2016-09-12 DIAGNOSIS — I10 Essential (primary) hypertension: Secondary | ICD-10-CM | POA: Diagnosis not present

## 2016-09-12 DIAGNOSIS — J189 Pneumonia, unspecified organism: Secondary | ICD-10-CM

## 2016-09-12 DIAGNOSIS — R5383 Other fatigue: Secondary | ICD-10-CM | POA: Diagnosis not present

## 2016-09-12 NOTE — Progress Notes (Signed)
Subjective:    Patient ID: [name], male    DOB: 01-Jun-1966, 50 y.o.   MRN: [id number]  HPI  08/22/16 Please review my recent office visits. Patient was ultimately started on amlodipine in addition to his Hyzaar for hypertension. At his last office visit, he was also started on a Z-Pak. His wife called back later and stated that he was worse. His blood pressure was also elevated. At that point I added Cardura and switch his antibiotic to Levaquin. Shortly thereafter, the patient developed severe hypotension and orthostatic dizziness. He was taken to the hospital where he was found to have very low blood pressure and was diagnosed with pneumonia. His pneumonia was treated with Levaquin. He was discharged home on Hyzaar and metoprolol. However his wife believes that he is also been taking Cardura in addition by accident. Since discharge from the hospital, he reports orthostatic dizziness, weakness, cramps. His heart rate is elevated and he is still appears dehydrated. He denies any shortness of breath. His cough has improved. He denies any pleurisy. Unfortunately he continues to smoke.  At that time, my plan was: I believe the patient is overmedicated by accident. I told the patient that I only want him taking Hyzaar and metoprolol. He is to discontinue Cardura. If he has not been taking Cardura, also want him to discontinue metoprolol. I told him to push fluids over the weekend. He is to be out of work until Tuesday when I reexamine him. Hopefully at that time his hypotension and dehydration will have resolved. I will check a CBC today along with a CMP. Strongly recommended smoking cessation. Recheck a chest x-ray in 4 weeks  08/26/16 The patient's breathing is back to his baseline. However he still feels extremely weak. He still reports orthostatic dizziness upon standing however his blood pressure is actually high at home and is normal here today. His lab work was unremarkable that was obtained  at his last visit. I believe some of the weakness and dizziness is due to his recovery from his recent pneumonia. I believe the other orthostatic dizziness could be due to his beta blocker.  At that time, my plan was: Continue Hyzaar. At this point I believe the patient should be cleared to return to work on Monday, October 9. This should help provide him adequate time to recover from his pneumonia. I believe the orthostatic dizziness is due to his beta blocker. I will have him discontinue metoprolol and resume amlodipine 10 mg a day. Recheck chest x-ray at the end of this month to ensure resolution of his pneumonia  09/02/16 The patient is worked in urgently today due to a rapid heartbeat. He called my nurse this afternoon and stated that his heart rate was as high as 160 bpm. He states that his heart rate has been racing all weekend. He continues to feel extremely lightheaded upon standing, extremely weak, and very fatigued even though he is now over a week out from his pneumonia. On examination today he is in sinus tachycardia with an elevated blood pressure despite taking amlodipine as well as Hyzaar. He is no longer on any statin medication. He is not taking any aspirin due to his recent history of a GI bleed. EKG is performed today in the office and compared to an EKG from May this year. There is now nonspecific ST depression in the inferior and lateral leads with T-wave inversions in lead 1, aVL, V5, and V6. This is a significant change when  compared to May of this year. Patient continues to smoke.  At that time, my plan was: Given his EKG changes, his shortness of breath, and his profound fatigue, I believe the patient has coronary artery disease and likely has significant blockage in either of the left main or one of its branches. He needs urgent cardiology evaluation. At this time he is stable and is having no angina and therefore does not require hospitalization. I want him to start aspirin 81 mg a  day evening given his history of GI bleed. I want him to continue amlodipine and Hyzaar. Meanwhile I will start him on carvedilol 12.5 mg by mouth twice a day for his tachycardia, presumed ASCVD, and elevated blood pressure. I want him to start Lipitor 40 mg a day. I'll try to arrange cardiology consultation ASAP. He is not to return to work. Should he develop chest pain he is to go straight to the hospital.  09/12/16 Fully, the patient's stress test performed by his cardiologist came back normal with no evidence of ischemia. Unfortunately he is only able to perform 5 METs showing poor cardiovascular fitness. He continues to remain slightly tachycardic although his blood pressure is much better today at 110/76. He still reports some dizziness and disequilibrium with position changes but his shortness of breath has improved and his fatigue has improved. He is scheduled to have an echocardiogram of his heart on Tuesday. He is due to recheck his chest x-ray to ensure resolution of his pneumonia and to rule out hidden malignancy Past Medical History:  Diagnosis Date  . Allergy   . Colonic diverticular abscess 12/26/2014  . Diverticulitis   . ETOH abuse   . GAD (generalized anxiety disorder)   . GERD (gastroesophageal reflux disease)   . Hypertension    Past Surgical History:  Procedure Laterality Date  . APPENDECTOMY N/A 01/01/2015   Procedure: INCIDENTAL APPENDECTOMY;  Surgeon: Jamesetta So, MD;  Location: [facility];  Service: General;  Laterality: N/A;  . GASTRECTOMY N/A 04/14/2016   Procedure: Toniann Ket;  Surgeon: Aviva Signs, MD;  Location: [facility];  Service: General;  Laterality: N/A;  . HEMORRHOID SURGERY    . HERNIA REPAIR    . PARTIAL COLECTOMY N/A 01/01/2015   Procedure: PARTIAL COLECTOMY;  Surgeon: Jamesetta So, MD;  Location: [facility];  Service: General;  Laterality: N/A;  . ROTATOR CUFF REPAIR     Current Outpatient Prescriptions on File Prior to Visit  Medication Sig Dispense Refill    . amLODipine (NORVASC) 10 MG tablet Take 1 tablet (10 mg total) by mouth daily. This replaces metoprolol 90 tablet 3  . atorvastatin (LIPITOR) 40 MG tablet Take 1 tablet (40 mg total) by mouth daily. 90 tablet 3  . carvedilol (COREG) 12.5 MG tablet Take 1 tablet (12.5 mg total) by mouth 2 (two) times daily with a meal. 60 tablet 3  . clonazePAM (KLONOPIN) 0.5 MG tablet TAKE 1 TABLET BY MOUTH TWICE A DAY AS NEEDED (MUST LAST 30 DAYS) 30 tablet 2  . cyclobenzaprine (FLEXERIL) 10 MG tablet TAKE 1 TABLET BY MOUTH AT BEDTIME AS NEEDED 30 tablet 2  . fluticasone (FLONASE) 50 MCG/ACT nasal spray Place 2 sprays into both nostrils daily. 16 g 6  . losartan-hydrochlorothiazide (HYZAAR) 50-12.5 MG tablet Take 2 tablets by mouth daily.   3  . NASAL SALINE NA Place 1 spray into the nose daily as needed. For nose bleeds    . pantoprazole (PROTONIX) 40 MG tablet Take 40 mg  by mouth 2 (two) times daily.  1  . venlafaxine XR (EFFEXOR-XR) 150 MG 24 hr capsule TAKE ONE CAPSULE BY MOUTH IN THE MORNING 90 capsule 1   No current facility-administered medications on file prior to visit.    No Known Allergies Social History   Social History  . Marital status: Divorced    Spouse name: N/A  . Number of children: N/A  . Years of education: N/A   Occupational History  . Not on file.   Social History Main Topics  . Smoking status: Current Every Day Smoker    Packs/day: 1.00    Types: Cigarettes  . Smokeless tobacco: Never Used  . Alcohol use 2.4 oz/week    4 Cans of beer per week     Comment: patient states "about 5 or 6 a day" (beers)  . Drug use: No  . Sexual activity: Yes   Other Topics Concern  . Not on file   Social History Narrative  . No narrative on file      Review of Systems  All other systems reviewed and are negative.      Objective:   Physical Exam  Constitutional: He appears well-developed and well-nourished.  Neck: Neck supple.  Cardiovascular: Normal rate, regular rhythm  and normal heart sounds.   No murmur heard. Pulmonary/Chest: Effort normal and breath sounds normal. No respiratory distress. He has no wheezes. He has no rales.  Abdominal: Soft. Bowel sounds are normal. He exhibits no distension. There is no tenderness. There is no rebound and no guarding.  Musculoskeletal: He exhibits no edema.  Lymphadenopathy:    He has no cervical adenopathy.  Vitals reviewed.         Assessment & Plan:  Community acquired pneumonia, unspecified laterality - Plan: DG Chest 2 View  Tachycardia  Other fatigue  Benign essential HTN His blood pressure is better. He has quit smoking!. He continues to remain slightly tachycardic but I believe this is a reflection of poor cardiovascular fitness as evidenced by his poor performance on his stress test. I encouraged gradually increasing aerobic exercise. I will still await the results of his echocardiogram. I would like the patient to go Monday and get his chest x-ray to ensure complete resolution of his pneumonia. He will get his echocardiogram on Tuesday. Absent any abnormality, the patient can return to work on Wednesday.

## 2016-09-12 NOTE — Telephone Encounter (Signed)
Pt seen in ov today and this was discussed then and taken care with a note to his work

## 2016-09-16 ENCOUNTER — Ambulatory Visit
Admission: RE | Admit: 2016-09-16 | Discharge: 2016-09-16 | Disposition: A | Discharge status: Home / Self Care | Payer: BLUE CROSS/BLUE SHIELD | Source: Ambulatory Visit | Attending: Family Medicine | Admitting: Family Medicine

## 2016-09-16 DIAGNOSIS — J189 Pneumonia, unspecified organism: Secondary | ICD-10-CM

## 2016-09-19 ENCOUNTER — Ambulatory Visit (INDEPENDENT_AMBULATORY_CARE_PROVIDER_SITE_OTHER): Payer: BLUE CROSS/BLUE SHIELD | Admitting: Family Medicine

## 2016-09-19 ENCOUNTER — Encounter: Payer: Self-pay | Admitting: Family Medicine

## 2016-09-19 ENCOUNTER — Telehealth: Payer: Self-pay | Admitting: *Deleted

## 2016-09-19 VITALS — BP 122/84 | HR 90 | Temp 98.9°F | Resp 18 | Ht 68.0 in | Wt 215.0 lb

## 2016-09-19 DIAGNOSIS — J189 Pneumonia, unspecified organism: Secondary | ICD-10-CM

## 2016-09-19 DIAGNOSIS — R42 Dizziness and giddiness: Secondary | ICD-10-CM | POA: Diagnosis not present

## 2016-09-19 DIAGNOSIS — I10 Essential (primary) hypertension: Secondary | ICD-10-CM | POA: Diagnosis not present

## 2016-09-19 NOTE — Progress Notes (Signed)
Subjective:    Patient ID: EIKER SAED, male    DOB: 1966/03/20, 50 y.o.   MRN: [id number]  Hypertension     08/22/16 Please review my recent office visits. Patient was ultimately started on amlodipine in addition to his Hyzaar for hypertension. At his last office visit, he was also started on a Z-Pak. His wife called back later and stated that he was worse. His blood pressure was also elevated. At that point I added Cardura and switch his antibiotic to Levaquin. Shortly thereafter, the patient developed severe hypotension and orthostatic dizziness. He was taken to the hospital where he was found to have very low blood pressure and was diagnosed with pneumonia. His pneumonia was treated with Levaquin. He was discharged home on Hyzaar and metoprolol. However his wife believes that he is also been taking Cardura in addition by accident. Since discharge from the hospital, he reports orthostatic dizziness, weakness, cramps. His heart rate is elevated and he is still appears dehydrated. He denies any shortness of breath. His cough has improved. He denies any pleurisy. Unfortunately he continues to smoke.  At that time, my plan was: I believe the patient is overmedicated by accident. I told the patient that I only want him taking Hyzaar and metoprolol. He is to discontinue Cardura. If he has not been taking Cardura, also want him to discontinue metoprolol. I told him to push fluids over the weekend. He is to be out of work until Tuesday when I reexamine him. Hopefully at that time his hypotension and dehydration will have resolved. I will check a CBC today along with a CMP. Strongly recommended smoking cessation. Recheck a chest x-ray in 4 weeks  08/26/16 The patient's breathing is back to his baseline. However he still feels extremely weak. He still reports orthostatic dizziness upon standing however his blood pressure is actually high at home and is normal here today. His lab work was unremarkable  that was obtained at his last visit. I believe some of the weakness and dizziness is due to his recovery from his recent pneumonia. I believe the other orthostatic dizziness could be due to his beta blocker.  At that time, my plan was: Continue Hyzaar. At this point I believe the patient should be cleared to return to work on Monday, October 9. This should help provide him adequate time to recover from his pneumonia. I believe the orthostatic dizziness is due to his beta blocker. I will have him discontinue metoprolol and resume amlodipine 10 mg a day. Recheck chest x-ray at the end of this month to ensure resolution of his pneumonia  09/02/16 The patient is worked in urgently today due to a rapid heartbeat. He called my nurse this afternoon and stated that his heart rate was as high as 160 bpm. He states that his heart rate has been racing all weekend. He continues to feel extremely lightheaded upon standing, extremely weak, and very fatigued even though he is now over a week out from his pneumonia. On examination today he is in sinus tachycardia with an elevated blood pressure despite taking amlodipine as well as Hyzaar. He is no longer on any statin medication. He is not taking any aspirin due to his recent history of a GI bleed. EKG is performed today in the office and compared to an EKG from May this year. There is now nonspecific ST depression in the inferior and lateral leads with T-wave inversions in lead 1, aVL, V5, and V6. This is a  significant change when compared to May of this year. Patient continues to smoke.  At that time, my plan was: Given his EKG changes, his shortness of breath, and his profound fatigue, I believe the patient has coronary artery disease and likely has significant blockage in either of the left main or one of its branches. He needs urgent cardiology evaluation. At this time he is stable and is having no angina and therefore does not require hospitalization. I want him to start  aspirin 81 mg a day evening given his history of GI bleed. I want him to continue amlodipine and Hyzaar. Meanwhile I will start him on carvedilol 12.5 mg by mouth twice a day for his tachycardia, presumed ASCVD, and elevated blood pressure. I want him to start Lipitor 40 mg a day. I'll try to arrange cardiology consultation ASAP. He is not to return to work. Should he develop chest pain he is to go straight to the hospital.  09/12/16 Fully, the patient's stress test performed by his cardiologist came back normal with no evidence of ischemia. Unfortunately he is only able to perform 5 METs showing poor cardiovascular fitness. He continues to remain slightly tachycardic although his blood pressure is much better today at 110/76. He still reports some dizziness and disequilibrium with position changes but his shortness of breath has improved and his fatigue has improved. He is scheduled to have an echocardiogram of his heart on Tuesday. He is due to recheck his chest x-ray to ensure resolution of his pneumonia and to rule out hidden malignancy.  At that time, my plan was: His blood pressure is better. He has quit smoking!. He continues to remain slightly tachycardic but I believe this is a reflection of poor cardiovascular fitness as evidenced by his poor performance on his stress test. I encouraged gradually increasing aerobic exercise. I will still await the results of his echocardiogram. I would like the patient to go Monday and get his chest x-ray to ensure complete resolution of his pneumonia. He will get his echocardiogram on Tuesday. Absent any abnormality, the patient can return to work on Wednesday.  09/19/16 Patient is here for follow-up. His blood pressure is still doing well. He states that it is higher in the morning but by mid afternoon it is much lower. However he is only taking his carvedilol once a day. He is not taking it twice a day. Furthermore he is not exactly sure what medication he is  taking. He wants to proceed with his back surgery. He recently passed a stress test. His repeat chest x-ray showed no residual pneumonia and no hidden malignancy. Therefore from a medical standpoint he is cleared to proceed with surgery. He does still complain of orthostatic dizziness. He denies any headache, blurry vision, double vision, vertigo. He does have some hearing loss. He also has some mild tinnitus Past Medical History:  Diagnosis Date  . Allergy   . Colonic diverticular abscess 12/26/2014  . Diverticulitis   . ETOH abuse   . GAD (generalized anxiety disorder)   . GERD (gastroesophageal reflux disease)   . Hypertension    Past Surgical History:  Procedure Laterality Date  . APPENDECTOMY N/A 01/01/2015   Procedure: INCIDENTAL APPENDECTOMY;  Surgeon: Jamesetta So, MD;  Location: [facility];  Service: General;  Laterality: N/A;  . GASTRECTOMY N/A 04/14/2016   Procedure: Toniann Ket;  Surgeon: Aviva Signs, MD;  Location: [facility];  Service: General;  Laterality: N/A;  . HEMORRHOID SURGERY    . HERNIA  REPAIR    . PARTIAL COLECTOMY N/A 01/01/2015   Procedure: PARTIAL COLECTOMY;  Surgeon: Jamesetta So, MD;  Location: [facility];  Service: General;  Laterality: N/A;  . ROTATOR CUFF REPAIR     Current Outpatient Prescriptions on File Prior to Visit  Medication Sig Dispense Refill  . amLODipine (NORVASC) 10 MG tablet Take 1 tablet (10 mg total) by mouth daily. This replaces metoprolol 90 tablet 3  . atorvastatin (LIPITOR) 40 MG tablet Take 1 tablet (40 mg total) by mouth daily. 90 tablet 3  . carvedilol (COREG) 12.5 MG tablet Take 1 tablet (12.5 mg total) by mouth 2 (two) times daily with a meal. 60 tablet 3  . clonazePAM (KLONOPIN) 0.5 MG tablet TAKE 1 TABLET BY MOUTH TWICE A DAY AS NEEDED (MUST LAST 30 DAYS) 30 tablet 2  . cyclobenzaprine (FLEXERIL) 10 MG tablet TAKE 1 TABLET BY MOUTH AT BEDTIME AS NEEDED 30 tablet 2  . pantoprazole (PROTONIX) 40 MG tablet Take 40 mg by mouth 2 (two) times  daily.  1  . venlafaxine XR (EFFEXOR-XR) 150 MG 24 hr capsule TAKE ONE CAPSULE BY MOUTH IN THE MORNING 90 capsule 1  . fluticasone (FLONASE) 50 MCG/ACT nasal spray Place 2 sprays into both nostrils daily. (Patient not taking: Reported on 09/19/2016) 16 g 6  . losartan-hydrochlorothiazide (HYZAAR) 50-12.5 MG tablet Take 2 tablets by mouth daily.   3  . NASAL SALINE NA Place 1 spray into the nose daily as needed. For nose bleeds     No current facility-administered medications on file prior to visit.    No Known Allergies Social History   Social History  . Marital status: Divorced    Spouse name: N/A  . Number of children: N/A  . Years of education: N/A   Occupational History  . Not on file.   Social History Main Topics  . Smoking status: Former Smoker    Packs/day: 1.00    Types: Cigarettes    Quit date: 09/05/2016  . Smokeless tobacco: Never Used  . Alcohol use 2.4 oz/week    4 Cans of beer per week     Comment: patient states "about 5 or 6 a day" (beers)  . Drug use: No  . Sexual activity: Yes   Other Topics Concern  . Not on file   Social History Narrative  . No narrative on file      Review of Systems  All other systems reviewed and are negative.      Objective:   Physical Exam  Constitutional: He appears well-developed and well-nourished.  Neck: Neck supple.  Cardiovascular: Normal rate, regular rhythm and normal heart sounds.   No murmur heard. Pulmonary/Chest: Effort normal and breath sounds normal. No respiratory distress. He has no wheezes. He has no rales.  Abdominal: Soft. Bowel sounds are normal. He exhibits no distension. There is no tenderness. There is no rebound and no guarding.  Musculoskeletal: He exhibits no edema.  Lymphadenopathy:    He has no cervical adenopathy.  Vitals reviewed.         Assessment & Plan:  Benign essential HTN  Community acquired pneumonia, unspecified laterality  Dizziness, nonspecific Pneumonia has  completely resolved. Blood pressure is stable. Take carvedilol twice a day as directed. He is medically cleared to proceed with surgery on his lower back area and I recommended increasing aerobic exercise to 30 minutes a day 5 days a week. The dizziness persists or he develops headaches blurry vision or double vision,  I will proceed with an MRI of the brain. However at the present time I suspect some of this is due to deconditioning versus possible Mnire's disease

## 2016-09-19 NOTE — Telephone Encounter (Signed)
Received call from patient.   Reports that MD requested list of medications at home and how patient is taking them.   Medications are as follows: Cardua 4mg  PO QD Norvasc 10mg  PO QD Coreg 12.5mg  PO BID Losartan/ HCTZ 50/12.5mg  PO QD ASA 81mg  PO QD Effexor 150mg  PO QD Flexeril 10mg  PO QHS Protonix 40mg  PO BID Klonopin 0.5mg  PO BID  MD to be made aware.

## 2016-09-22 ENCOUNTER — Encounter: Payer: Self-pay | Admitting: Family Medicine

## 2016-09-22 NOTE — Telephone Encounter (Signed)
Stop cardura.  Continue norvasc, hyzaar, and coreg.  Cardura may cause dizziness.

## 2016-09-22 NOTE — Telephone Encounter (Signed)
Call placed to patient and patient spouse [name] made aware.

## 2016-10-01 ENCOUNTER — Other Ambulatory Visit: Payer: Self-pay | Admitting: Family Medicine

## 2016-10-01 DIAGNOSIS — R000 Tachycardia, unspecified: Secondary | ICD-10-CM

## 2016-10-01 MED ORDER — CARVEDILOL 12.5 MG PO TABS: 12.5000 mg | ORAL_TABLET | Freq: Two times a day (BID) | ORAL | 3 refills | Status: DC

## 2016-10-03 ENCOUNTER — Telehealth: Payer: Self-pay | Admitting: Family Medicine

## 2016-10-03 NOTE — Telephone Encounter (Signed)
Pt called [facility] stating that he has been checking his BP and it is 130-40/91-100 and you had mentioned something about another BP med if it stayed high. (please call wife with med changes [phone] Drue Dun)

## 2016-10-03 NOTE — Telephone Encounter (Signed)
If still high I would add back the doxazosin he was taking 4 mg a day.

## 2016-10-03 NOTE — Telephone Encounter (Signed)
Patient aware of providers recommendations via vm 

## 2016-10-08 ENCOUNTER — Other Ambulatory Visit: Payer: Self-pay | Admitting: Family Medicine

## 2016-10-09 NOTE — Telephone Encounter (Signed)
Approved. #[id number]. 

## 2016-10-09 NOTE — Telephone Encounter (Signed)
Ok to refill 

## 2016-10-18 ENCOUNTER — Other Ambulatory Visit: Payer: Self-pay | Admitting: Family Medicine

## 2016-10-20 NOTE — Telephone Encounter (Signed)
Ok to refill 

## 2016-10-20 NOTE — Telephone Encounter (Signed)
ok 

## 2016-10-21 NOTE — Telephone Encounter (Signed)
ok 

## 2016-12-06 ENCOUNTER — Other Ambulatory Visit: Payer: Self-pay | Admitting: Family Medicine

## 2016-12-08 ENCOUNTER — Other Ambulatory Visit: Payer: Self-pay | Admitting: Family Medicine

## 2016-12-08 DIAGNOSIS — J302 Other seasonal allergic rhinitis: Secondary | ICD-10-CM

## 2016-12-29 ENCOUNTER — Other Ambulatory Visit: Payer: Self-pay | Admitting: Family Medicine

## 2017-01-11 ENCOUNTER — Other Ambulatory Visit: Payer: Self-pay | Admitting: Family Medicine

## 2017-01-12 NOTE — Telephone Encounter (Signed)
Ok to refill 

## 2017-01-12 NOTE — Telephone Encounter (Signed)
ok 

## 2017-01-26 ENCOUNTER — Other Ambulatory Visit: Payer: Self-pay | Admitting: Family Medicine

## 2017-01-26 NOTE — Telephone Encounter (Signed)
Ok to refill 

## 2017-01-27 NOTE — Telephone Encounter (Signed)
Medication called to pharmacy. 

## 2017-01-27 NOTE — Telephone Encounter (Signed)
ok 

## 2017-01-28 ENCOUNTER — Other Ambulatory Visit: Payer: Self-pay | Admitting: Family Medicine

## 2017-02-07 ENCOUNTER — Other Ambulatory Visit: Payer: Self-pay | Admitting: General Surgery

## 2017-04-04 ENCOUNTER — Other Ambulatory Visit: Payer: Self-pay | Admitting: General Surgery

## 2017-04-11 ENCOUNTER — Other Ambulatory Visit: Payer: Self-pay | Admitting: Family Medicine

## 2017-04-13 NOTE — Telephone Encounter (Signed)
ok 

## 2017-04-13 NOTE — Telephone Encounter (Signed)
Ok to refill 

## 2017-04-24 ENCOUNTER — Telehealth: Payer: Self-pay | Admitting: Family Medicine

## 2017-04-24 MED ORDER — PANTOPRAZOLE SODIUM 40 MG PO TBEC: 40.0000 mg | DELAYED_RELEASE_TABLET | Freq: Two times a day (BID) | ORAL | 3 refills | Status: DC

## 2017-04-24 NOTE — Telephone Encounter (Signed)
Pt needs refill on protonix

## 2017-04-24 NOTE — Telephone Encounter (Signed)
Medication called/sent to requested pharmacy  

## 2017-05-02 ENCOUNTER — Other Ambulatory Visit: Payer: Self-pay | Admitting: Family Medicine

## 2017-05-04 NOTE — Telephone Encounter (Signed)
ok 

## 2017-05-04 NOTE — Telephone Encounter (Signed)
Ok to refill 

## 2017-05-31 ENCOUNTER — Other Ambulatory Visit: Payer: Self-pay | Admitting: Family Medicine

## 2017-06-01 NOTE — Telephone Encounter (Signed)
Ok to refill 

## 2017-06-01 NOTE — Telephone Encounter (Signed)
ok 

## 2017-07-07 ENCOUNTER — Other Ambulatory Visit: Payer: Self-pay | Admitting: Family Medicine

## 2017-07-07 ENCOUNTER — Ambulatory Visit (INDEPENDENT_AMBULATORY_CARE_PROVIDER_SITE_OTHER): Payer: BLUE CROSS/BLUE SHIELD | Admitting: Family Medicine

## 2017-07-07 ENCOUNTER — Encounter: Payer: Self-pay | Admitting: Family Medicine

## 2017-07-07 VITALS — BP 134/68 | HR 104 | Temp 98.5°F | Resp 16 | Ht 68.0 in | Wt 221.0 lb

## 2017-07-07 DIAGNOSIS — N62 Hypertrophy of breast: Secondary | ICD-10-CM | POA: Diagnosis not present

## 2017-07-07 NOTE — Progress Notes (Signed)
Subjective:    Patient ID: [name], male    DOB: Jun 29, 1966, 51 y.o.   MRN: [id number]  HPI Patient reports a three-week history of gradual enlargement of the right breast and tenderness of the right breast. There are no palpable masses in the right breast. There is no nipple discharge. There is no erythema or warmth. On examination today there is no obvious abnormality although it is tender to palpation. At the same. Time, the patient also reports fatigue, decreasing libido, and erectile problems. He denies any night sweats or fever. He does report weight gain but he is attributed that to being inactive after his recent back surgery. Past Medical History:  Diagnosis Date  . Allergy   . Colonic diverticular abscess 12/26/2014  . Diverticulitis   . ETOH abuse   . GAD (generalized anxiety disorder)   . GERD (gastroesophageal reflux disease)   . Hypertension    Past Surgical History:  Procedure Laterality Date  . APPENDECTOMY N/A 01/01/2015   Procedure: INCIDENTAL APPENDECTOMY;  Surgeon: Jamesetta So, MD;  Location: [facility];  Service: General;  Laterality: N/A;  . GASTRECTOMY N/A 04/14/2016   Procedure: Toniann Ket;  Surgeon: Aviva Signs, MD;  Location: [facility];  Service: General;  Laterality: N/A;  . HEMORRHOID SURGERY    . HERNIA REPAIR    . PARTIAL COLECTOMY N/A 01/01/2015   Procedure: PARTIAL COLECTOMY;  Surgeon: Jamesetta So, MD;  Location: [facility];  Service: General;  Laterality: N/A;  . ROTATOR CUFF REPAIR     Current Outpatient Prescriptions on File Prior to Visit  Medication Sig Dispense Refill  . amLODipine (NORVASC) 10 MG tablet Take 1 tablet (10 mg total) by mouth daily. This replaces metoprolol 90 tablet 3  . atorvastatin (LIPITOR) 40 MG tablet Take 1 tablet (40 mg total) by mouth daily. 90 tablet 3  . carvedilol (COREG) 12.5 MG tablet Take 1 tablet (12.5 mg total) by mouth 2 (two) times daily with a meal. 180 tablet 3  . clonazePAM (KLONOPIN) 0.5 MG tablet TAKE 1  TABLET BY MOUTH TWICE A DAY AS NEEDED 30 tablet 2  . cyclobenzaprine (FLEXERIL) 10 MG tablet TAKE 1 TABLET BY MOUTH AT BEDTIME AS NEEDED 30 tablet 2  . doxazosin (CARDURA) 4 MG tablet TAKE 1 TABLET (4 MG TOTAL) BY MOUTH DAILY. DISCONTINUE ATENOLOL 30 tablet 1  . fluticasone (FLONASE) 50 MCG/ACT nasal spray PLACE 2 SPRAYS INTO BOTH NOSTRILS DAILY. 48 g 1  . losartan-hydrochlorothiazide (HYZAAR) 50-12.5 MG tablet Take 2 tablets by mouth daily.   3  . NASAL SALINE NA Place 1 spray into the nose daily as needed. For nose bleeds    . pantoprazole (PROTONIX) 40 MG tablet Take 1 tablet (40 mg total) by mouth 2 (two) times daily. 180 tablet 3  . venlafaxine XR (EFFEXOR-XR) 150 MG 24 hr capsule TAKE 1 CAPSULE BY MOUTH IN THE MORNING 90 capsule 1   No current facility-administered medications on file prior to visit.    No Known Allergies Social History   Social History  . Marital status: Divorced    Spouse name: N/A  . Number of children: N/A  . Years of education: N/A   Occupational History  . Not on file.   Social History Main Topics  . Smoking status: Former Smoker    Packs/day: 1.00    Types: Cigarettes    Quit date: 09/05/2016  . Smokeless tobacco: Never Used  . Alcohol use 2.4 oz/week    4  Cans of beer per week     Comment: patient states "about 5 or 6 a day" (beers)  . Drug use: No  . Sexual activity: Yes   Other Topics Concern  . Not on file   Social History Narrative  . No narrative on file      Review of Systems  All other systems reviewed and are negative.      Objective:   Physical Exam  Cardiovascular: Normal rate, regular rhythm and normal heart sounds.   Pulmonary/Chest: Effort normal and breath sounds normal. No respiratory distress. He has no wheezes. He has no rales. Right breast exhibits tenderness. Right breast exhibits no inverted nipple, no mass, no nipple discharge and no skin change. Left breast exhibits no inverted nipple, no mass, no nipple  discharge, no skin change and no tenderness. Breasts are symmetrical.  Abdominal: Soft. Bowel sounds are normal.  Vitals reviewed.         Assessment & Plan:  Gynecomastia - Plan: COMPLETE METABOLIC PANEL WITH GFR, CBC with Differential/Platelet, TSH, Testosterone Total,Free,Bio, Males, Prolactin  I feel this is benign gynecomastia related to hormone fluctuations in the body.  I feel no abnormality to suggest breast cancer.  No infection.  Evaluate with cbc, cmp, prolactin level, and testosterone level.

## 2017-07-08 LAB — COMPLETE METABOLIC PANEL WITH GFR
ALBUMIN: 4.2 g/dL (ref 3.6–5.1)
ALK PHOS: 103 U/L (ref 40–115)
ALT: 25 U/L (ref 9–46)
AST: 27 U/L (ref 10–35)
BILIRUBIN TOTAL: 0.2 mg/dL (ref 0.2–1.2)
BUN: 17 mg/dL (ref 7–25)
CALCIUM: 9.1 mg/dL (ref 8.6–10.3)
CO2: 26 mmol/L (ref 20–32)
Chloride: 98 mmol/L (ref 98–110)
Creat: 1.03 mg/dL (ref 0.70–1.33)
GFR, EST NON AFRICAN AMERICAN: 83 mL/min (ref >=60)
GFR, Est African American: >89 mL/min (ref >=60)
GLUCOSE: 100 mg/dL — AB (ref 70–99)
Potassium: 3.6 mmol/L (ref 3.5–5.3)
SODIUM: 136 mmol/L (ref 135–146)
Total Protein: 6.6 g/dL (ref 6.1–8.1)

## 2017-07-08 LAB — CBC WITH DIFFERENTIAL/PLATELET
BASOS ABS: 0 {cells}/uL (ref 0–200)
Basophils Relative: 0 %
EOS PCT: 1 %
Eosinophils Absolute: 127 cells/uL (ref 15–500)
HCT: 34 % — ABNORMAL LOW (ref 38.5–50.0)
Hemoglobin: 10.6 g/dL — ABNORMAL LOW (ref 13.0–17.0)
LYMPHS PCT: 20 %
Lymphs Abs: 2540 cells/uL (ref 850–3900)
MCH: 23.6 pg — AB (ref 27.0–33.0)
MCHC: 31.2 g/dL — AB (ref 32.0–36.0)
MCV: 75.7 fL — ABNORMAL LOW (ref 80.0–100.0)
MONOS PCT: 13 %
MPV: 9.3 fL (ref 7.5–12.5)
Monocytes Absolute: 1651 cells/uL — ABNORMAL HIGH (ref 200–950)
NEUTROS PCT: 66 %
Neutro Abs: 8382 cells/uL — ABNORMAL HIGH (ref 1500–7800)
Platelets: 455 10*3/uL — ABNORMAL HIGH (ref 140–400)
RBC: 4.49 MIL/uL (ref 4.20–5.80)
RDW: 16.7 % — AB (ref 11.0–15.0)
WBC: 12.7 10*3/uL — AB (ref 3.8–10.8)

## 2017-07-08 LAB — PROLACTIN: Prolactin: 9.1 ng/mL (ref 2.0–18.0)

## 2017-07-08 LAB — TESTOSTERONE TOTAL,FREE,BIO, MALES
Albumin: 4.2 g/dL (ref 3.6–5.1)
Sex Hormone Binding: 42 nmol/L (ref 10–50)
TESTOSTERONE FREE: 33 pg/mL — AB (ref 46.0–224.0)
Testosterone, Bioavailable: 63.5 ng/dL — ABNORMAL LOW (ref 110.0–575.0)
Testosterone: 312 ng/dL (ref 250–827)

## 2017-07-08 LAB — TSH: TSH: 1.17 m[IU]/L (ref 0.40–4.50)

## 2017-07-09 ENCOUNTER — Other Ambulatory Visit: Payer: Self-pay | Admitting: Family Medicine

## 2017-07-09 DIAGNOSIS — D649 Anemia, unspecified: Secondary | ICD-10-CM

## 2017-07-10 LAB — FERRITIN: FERRITIN: 24 ng/mL (ref 20–380)

## 2017-07-10 LAB — VITAMIN B12: Vitamin B-12: 297 pg/mL (ref 200–1100)

## 2017-07-15 ENCOUNTER — Other Ambulatory Visit: Payer: Self-pay | Admitting: Family Medicine

## 2017-07-15 DIAGNOSIS — D509 Iron deficiency anemia, unspecified: Secondary | ICD-10-CM

## 2017-07-18 ENCOUNTER — Other Ambulatory Visit: Payer: Self-pay | Admitting: Family Medicine

## 2017-07-20 ENCOUNTER — Other Ambulatory Visit: Payer: BLUE CROSS/BLUE SHIELD

## 2017-07-20 DIAGNOSIS — D649 Anemia, unspecified: Secondary | ICD-10-CM

## 2017-07-21 LAB — FECAL GLOBIN BY IMMUNOCHEMISTRY: Fecal Globin Immuno: DETECTED — AB

## 2017-07-24 ENCOUNTER — Other Ambulatory Visit: Payer: Self-pay | Admitting: Family Medicine

## 2017-07-24 DIAGNOSIS — K921 Melena: Secondary | ICD-10-CM

## 2017-07-26 ENCOUNTER — Other Ambulatory Visit: Payer: Self-pay | Admitting: Family Medicine

## 2017-08-03 ENCOUNTER — Telehealth: Payer: Self-pay | Admitting: *Deleted

## 2017-08-03 NOTE — Telephone Encounter (Signed)
Referral not just for colonoscopy but pt has blood in stools.  Have call into [facility] was sent to them 8/31.  They said they will call pt and set up appt

## 2017-08-03 NOTE — Telephone Encounter (Signed)
Received call from patient.   Patient inquired as to status of referral for colonoscopy.   Please look into this for patient.

## 2017-08-04 ENCOUNTER — Encounter: Payer: Self-pay | Admitting: Internal Medicine

## 2017-08-04 ENCOUNTER — Encounter: Payer: Self-pay | Admitting: Nurse Practitioner

## 2017-08-17 ENCOUNTER — Ambulatory Visit (INDEPENDENT_AMBULATORY_CARE_PROVIDER_SITE_OTHER): Payer: BLUE CROSS/BLUE SHIELD | Admitting: Family Medicine

## 2017-08-17 ENCOUNTER — Encounter: Payer: Self-pay | Admitting: Family Medicine

## 2017-08-17 VITALS — BP 126/76 | HR 88 | Temp 98.4°F | Resp 18 | Wt 213.4 lb

## 2017-08-17 DIAGNOSIS — R000 Tachycardia, unspecified: Secondary | ICD-10-CM

## 2017-08-17 DIAGNOSIS — I10 Essential (primary) hypertension: Secondary | ICD-10-CM

## 2017-08-17 DIAGNOSIS — J209 Acute bronchitis, unspecified: Secondary | ICD-10-CM | POA: Diagnosis not present

## 2017-08-17 MED ORDER — CARVEDILOL 12.5 MG PO TABS
12.5000 mg | ORAL_TABLET | Freq: Two times a day (BID) | ORAL | 3 refills | Status: DC
Start: 2017-08-17 — End: 2018-08-13

## 2017-08-17 MED ORDER — PANTOPRAZOLE SODIUM 40 MG PO TBEC: 40.0000 mg | DELAYED_RELEASE_TABLET | Freq: Two times a day (BID) | ORAL | 3 refills | Status: DC

## 2017-08-17 MED ORDER — GUAIFENESIN-CODEINE 100-10 MG/5ML PO SOLN: 5.0000 mL | Freq: Four times a day (QID) | ORAL | 0 refills | Status: DC | PRN

## 2017-08-17 MED ORDER — AZITHROMYCIN 250 MG PO TABS: ORAL_TABLET | ORAL | 0 refills | Status: DC

## 2017-08-17 MED ORDER — LOSARTAN POTASSIUM-HCTZ 50-12.5 MG PO TABS: 2.0000 | ORAL_TABLET | Freq: Every day | ORAL | 2 refills | Status: DC

## 2017-08-17 MED ORDER — AMLODIPINE BESYLATE 10 MG PO TABS: 10.0000 mg | ORAL_TABLET | Freq: Every day | ORAL | 3 refills | Status: DC

## 2017-08-17 MED ORDER — METHYLPREDNISOLONE ACETATE 40 MG/ML IJ SUSP
40.0000 mg | Freq: Once | INTRAMUSCULAR | Status: AC
  Administered 2017-08-17: 40 mg via INTRAMUSCULAR

## 2017-08-17 NOTE — Patient Instructions (Signed)
F/u as needed

## 2017-08-17 NOTE — Progress Notes (Signed)
   Subjective:    Patient ID: [name], male    DOB: [birth year], 51 y.o.   MRN: [id number]  Patient presents for URI (had had cold x 2 weeks) and Medication Refill (losartan/pantoprazole) Patient here with cough with congestion for the past 2 weeks. He's had some nasal drainage postnasal drip as well. Is now in his chest. He asked some occasional wheezing typically at nighttime. He is a smoker smokes 1 pack per day. He has not had any fever no shortness of breath. He's never had to use any inhalers. He did have many quite pneumonia last year. He asked about his recent labs. He had hemoglobin of 10 with a positive Hemoccult and iron deficiency his appointment to see gastroenterology in October. His white blood cells were mildly elevated. He also had low testosterone which is going to be deferred on treatment until we figure out his anemia which I explained to him.  He uses over-the-counter NyQuil, Mucinex Tylenol Medications were reviewed and refilled   Review Of Systems:  GEN- denies fatigue, fever, weight loss,weakness, recent illness HEENT- denies eye drainage, change in vision, +nasal discharge, CVS- denies chest pain, palpitations RESP- denies SOB, +cough, +wheeze ABD- denies N/V, change in stools, abd pain GU- denies dysuria, hematuria, dribbling, incontinence MSK- denies joint pain, muscle aches, injury Neuro- denies headache, dizziness, syncope, seizure activity       Objective:    BP 126/76 (BP Location: Right Arm, Patient Position: Sitting, Cuff Size: Large)   Pulse 88   Temp 98.4 F (36.9 C) (Oral)   Resp 18   Wt 213 lb 6.4 oz (96.8 kg)   SpO2 95%   BMI 32.45 kg/m  GEN- NAD, alert and oriented x3 HEENT- PERRL, EOMI, non injected sclera, pink conjunctiva, MMM, oropharynx clear,nares clear rhinorrhea , TM clear bilt no effusion, no maxillary sinus tenderness Neck- Supple, no LAD  CVS- RRR, no murmur RESP- mild rhonchi bilat in lower lungs, no wheeze, normal  WOB  EXT- No edema Pulses- Radial  2+        Assessment & Plan:      Problem List Items Addressed This Visit    None    Visit Diagnoses    Acute bronchitis, unspecified organism    -  Primary   Given Depo Medrol 40mg  IM, robitussin codiene, zpak, encouraged tobacco cessation, no hypoxia today. Floanse for nasal symptoms   Tachycardia       Relevant Medications   carvedilol (COREG) 12.5 MG tablet   Benign essential HTN       Bp looks good, medications refilled, on coreg due to tachycardia in past   Relevant Medications   losartan-hydrochlorothiazide (HYZAAR) 50-12.5 MG tablet   carvedilol (COREG) 12.5 MG tablet   amLODipine (NORVASC) 10 MG tablet      Note: This dictation was prepared with Dragon dictation along with smaller phrase technology. Any transcriptional errors that result from this process are unintentional.

## 2017-08-17 NOTE — Addendum Note (Signed)
Addended by: Olena Mater on: 08/17/2017 04:02 PM   Modules accepted: Orders

## 2017-09-08 ENCOUNTER — Other Ambulatory Visit: Payer: Self-pay | Admitting: Family Medicine

## 2017-09-08 NOTE — Telephone Encounter (Signed)
Ok to refill 

## 2017-09-09 NOTE — Telephone Encounter (Signed)
Medication called to pharmacy. 

## 2017-09-09 NOTE — Telephone Encounter (Signed)
ok 

## 2017-09-17 ENCOUNTER — Encounter: Payer: Self-pay | Admitting: Nurse Practitioner

## 2017-09-17 ENCOUNTER — Telehealth: Payer: Self-pay

## 2017-09-17 ENCOUNTER — Other Ambulatory Visit: Payer: Self-pay

## 2017-09-17 ENCOUNTER — Ambulatory Visit (INDEPENDENT_AMBULATORY_CARE_PROVIDER_SITE_OTHER): Payer: BLUE CROSS/BLUE SHIELD | Admitting: Nurse Practitioner

## 2017-09-17 DIAGNOSIS — R195 Other fecal abnormalities: Secondary | ICD-10-CM

## 2017-09-17 DIAGNOSIS — K649 Unspecified hemorrhoids: Secondary | ICD-10-CM

## 2017-09-17 MED ORDER — PEG 3350-KCL-NA BICARB-NACL 420 G PO SOLR: 4000.0000 mL | ORAL | 0 refills | Status: DC

## 2017-09-17 NOTE — Telephone Encounter (Signed)
Tried to call pt to schedule Colonoscopy w/Propofol with [facility]. His wife said he hasn't got home from work. Asked her to have him call office.

## 2017-09-17 NOTE — Assessment & Plan Note (Signed)
The patient admits scant toilet tissue hematochezia. His primary care from heme positive stool. He is 51 years old, has never had a colonoscopy before. At this point he was due for screening but given heme positive stool will require diagnostic colonoscopy at this time. He does have a history of diverticulitis and had perforated diverticular disease requiring sigmoid partial colectomy in February 2016. He had an uneventful postop period. At this point we'll proceed with colonoscopy.  Proceed with TCS on propofol/MAC with Dr. Gala Romney in near future: the risks, benefits, and alternatives have been discussed with the patient in detail. The patient states understanding and desires to proceed.  The patient is on Flexeril and Klonopin. He drinks 5-6 beers a day. No other anticoagulants, anxiolytics, chronic pain medications, or antidepressants. We will plan for the procedure on propofol/MAC to promote adequate sedation.

## 2017-09-17 NOTE — Progress Notes (Signed)
Primary Care Physician:  Susy Frizzle, MD Primary Gastroenterologist:  Dr. Gala Romney  Chief Complaint  Patient presents with  . Blood In Stools    bright red, never had tcs    HPI:   [name] is a 51 y.o. male who presents for referral from primary care for first-ever colonoscopy due to blood in stool. He is never had a colonoscopy before. No colonoscopy records can be found in our system on second check. PCP notes reviewed. Labs reviewed. Heme positive stool on 07/18/2017.  Today he states he's doing well overall. Has never had a colonoscopy. Heme+ stool on PCP exam. Has history of hemorrhoids s/p banding and hemorrhoid surgery. Occasional toilet tissue hematochezia. Denies history of constipation. When he has a bowel movement he only sits for about 30 minutes. Denies abdominal pain, N/V, fever, chills, melena, unintentional weight loss. Previously had partial colon resection related to perforating diverticular disease in 12/2014. Did well after surgery. Denies chest pain, dyspnea, dizziness, lightheadedness, syncope, near syncope. Denies any other upper or lower GI symptoms.  Past Medical History:  Diagnosis Date  . Allergy   . Colonic diverticular abscess 12/26/2014  . Diverticulitis   . ETOH abuse   . GAD (generalized anxiety disorder)   . GERD (gastroesophageal reflux disease)   . Hypertension     Past Surgical History:  Procedure Laterality Date  . APPENDECTOMY N/A 01/01/2015   Procedure: INCIDENTAL APPENDECTOMY;  Surgeon: Jamesetta So, MD;  Location: [facility];  Service: General;  Laterality: N/A;  . GASTRECTOMY N/A 04/14/2016   Procedure: Toniann Ket;  Surgeon: Aviva Signs, MD;  Location: [facility];  Service: General;  Laterality: N/A;  . HEMORRHOID SURGERY    . HERNIA REPAIR    . PARTIAL COLECTOMY N/A 01/01/2015   Procedure: PARTIAL COLECTOMY;  Surgeon: Jamesetta So, MD;  Location: [facility];  Service: General;  Laterality: N/A;  . ROTATOR CUFF REPAIR      Current  Outpatient Prescriptions  Medication Sig Dispense Refill  . amLODipine (NORVASC) 10 MG tablet Take 1 tablet (10 mg total) by mouth daily. This replaces metoprolol 90 tablet 3  . atorvastatin (LIPITOR) 40 MG tablet Take 1 tablet (40 mg total) by mouth daily. 90 tablet 3  . carvedilol (COREG) 12.5 MG tablet Take 1 tablet (12.5 mg total) by mouth 2 (two) times daily with a meal. 180 tablet 3  . clonazePAM (KLONOPIN) 0.5 MG tablet TAKE 1 TABLET BY MOUTH TWICE A DAY AS NEEDED (Patient taking differently: TAKE 1 TABLET BY MOUTH TWICE A DAY AS NEEDED. Takes daily at bedtime) 30 tablet 2  . cyclobenzaprine (FLEXERIL) 10 MG tablet TAKE 1 TABLET BY MOUTH EVERY DAY AT BEDTIME AS NEEDED 30 tablet 2  . fluticasone (FLONASE) 50 MCG/ACT nasal spray Place 1 spray into both nostrils daily.  1  . losartan-hydrochlorothiazide (HYZAAR) 50-12.5 MG tablet Take 2 tablets by mouth daily. 180 tablet 2  . pantoprazole (PROTONIX) 40 MG tablet Take 1 tablet (40 mg total) by mouth 2 (two) times daily. 180 tablet 3  . venlafaxine XR (EFFEXOR-XR) 150 MG 24 hr capsule TAKE 1 CAPSULE BY MOUTH IN THE MORNING 90 capsule 1   No current facility-administered medications for this visit.     Allergies as of 09/17/2017  . (No Known Allergies)    Family History  Problem Relation Age of Onset  . Stroke Mother   . CAD Father   . Colon cancer Neg Hx     Social  History   Social History  . Marital status: Divorced    Spouse name: N/A  . Number of children: N/A  . Years of education: N/A   Occupational History  . Not on file.   Social History Main Topics  . Smoking status: Current Every Day Smoker    Packs/day: 1.00    Types: Cigarettes    Last attempt to quit: 09/05/2016  . Smokeless tobacco: Never Used  . Alcohol use 2.4 oz/week    4 Cans of beer per week     Comment: patient states "about 5 or 6 a day" (beers)  . Drug use: No  . Sexual activity: Yes   Other Topics Concern  . Not on file   Social History  Narrative  . No narrative on file    Review of Systems: General: Negative for anorexia, weight loss, fever, chills, fatigue, weakness. ENT: Negative for hoarseness, difficulty swallowing. CV: Negative for chest pain, angina, palpitations, peripheral edema.  Respiratory: Negative for dyspnea at rest, cough, sputum, wheezing.  GI: See history of present illness. MS: Negative for joint pain, low back pain.  Derm: Negative for rash or itching.  Endo: Negative for unusual weight change.  Heme: Negative for bruising or bleeding. Allergy: Negative for rash or hives.    Physical Exam: BP 129/82   Pulse 73   Temp (!) 97.3 F (36.3 C) (Oral)   Ht 5\' 8"  (1.727 m)   Wt 211 lb 12.8 oz (96.1 kg)   BMI 32.20 kg/m  General:   Obese male. Alert and oriented. Pleasant and cooperative. Well-nourished and well-developed.  Head:  Normocephalic and atraumatic. Eyes:  Without icterus, sclera clear and conjunctiva pink.  Ears:  Normal auditory acuity. Cardiovascular:  S1, S2 present without murmurs appreciated. Extremities without clubbing or edema. Respiratory:  Clear to auscultation bilaterally. No wheezes, rales, or rhonchi. No distress.  Gastrointestinal:  +BS, soft, non-tender and non-distended. No HSM noted. No guarding or rebound. No masses appreciated.  Rectal:  Deferred  Musculoskalatal:  Symmetrical without gross deformities. Neurologic:  Alert and oriented x4;  grossly normal neurologically. Psych:  Alert and cooperative. Normal mood and affect. Heme/Lymph/Immune: No excessive bruising noted.    09/17/2017 9:05 AM   Disclaimer: This note was dictated with voice recognition software. Similar sounding words can inadvertently be transcribed and may not be corrected upon review.

## 2017-09-17 NOTE — Patient Instructions (Signed)
1. We will schedule your colonoscopy for you. 2. Further recommendations will be made based on the results of your procedure. 3. I have made a note that you are interested in hemorrhoid banding. You can mention this the day of your procedure as well. 4. Return for follow-up based on postprocedure recommendations. 5. Call us if you have any questions or concerns.

## 2017-09-17 NOTE — Telephone Encounter (Signed)
Pt called office. TCS w/Propofol with [facility] scheduled for 11/02/17 at 9:30am. Rx for prep sent to pharmacy. Orders entered. Will mail instructions after pre-op appt is scheduled.

## 2017-09-17 NOTE — Assessment & Plan Note (Signed)
The patient has chronic hemorrhoids. Previously underwent surgery and banding for these. They're still problematic and resultant scant toilet tissue hematochezia. He is not constipated, does not spend a prolonged time on the toilet. Toilet tissue hematochezia is intermittent. He is requesting banding, if he is a candidate. Return for follow-up based on postprocedure recommendations and for any recommended banding evaluation.

## 2017-09-18 NOTE — Telephone Encounter (Signed)
Pre-op appt 10/27/17 at 10:00am. Letter mailed with procedure instructions.

## 2017-09-24 ENCOUNTER — Other Ambulatory Visit: Payer: Self-pay | Admitting: Family Medicine

## 2017-09-24 DIAGNOSIS — R000 Tachycardia, unspecified: Secondary | ICD-10-CM

## 2017-10-11 ENCOUNTER — Other Ambulatory Visit: Payer: Self-pay | Admitting: Family Medicine

## 2017-10-12 NOTE — Telephone Encounter (Signed)
Ok to refill 

## 2017-10-12 NOTE — Telephone Encounter (Signed)
ok 

## 2017-10-26 NOTE — Patient Instructions (Signed)
[name]  10/26/2017     @PREFPERIOPPHARMACY @   Your procedure is scheduled on  11/02/2017   Report to [facility] at  730   A.M.  Call this number if you have problems the morning of surgery:  [phone]   Remember:  Do not eat food or drink liquids after midnight.  Take these medicines the morning of surgery with A SIP OF WATER  Amlodipine, coreg, klonopin, flexaril, protonix, effexor.   Do not wear jewelry, make-up or nail polish.  Do not wear lotions, powders, or perfumes, or deoderant.  Do not shave 48 hours prior to surgery.  Men may shave face and neck.  Do not bring valuables to the hospital.  [facility] is not responsible for any belongings or valuables.  Contacts, dentures or bridgework may not be worn into surgery.  Leave your suitcase in the car.  After surgery it may be brought to your room.  For patients admitted to the hospital, discharge time will be determined by your treatment team.  Patients discharged the day of surgery will not be allowed to drive home.   Name and phone number of your driver:   family Special instructions:  Follow the diet and prep instructions given to you by Dr Roseanne Kaufman office.  Please read over the following fact sheets that you were given. Anesthesia Post-op Instructions and Care and Recovery After Surgery       Colonoscopy, Adult A colonoscopy is an exam to look at the entire large intestine. During the exam, a lubricated, bendable tube is inserted into the anus and then passed into the rectum, colon, and other parts of the large intestine. A colonoscopy is often done as a part of normal colorectal screening or in response to certain symptoms, such as anemia, persistent diarrhea, abdominal pain, and blood in the stool. The exam can help screen for and diagnose medical problems, including:  Tumors.  Polyps.  Inflammation.  Areas of bleeding.  Tell a health care provider about:  Any allergies you  have.  All medicines you are taking, including vitamins, herbs, eye drops, creams, and over-the-counter medicines.  Any problems you or family members have had with anesthetic medicines.  Any blood disorders you have.  Any surgeries you have had.  Any medical conditions you have.  Any problems you have had passing stool. What are the risks? Generally, this is a safe procedure. However, problems may occur, including:  Bleeding.  A tear in the intestine.  A reaction to medicines given during the exam.  Infection (rare).  What happens before the procedure? Eating and drinking restrictions Follow instructions from your health care provider about eating and drinking, which may include:  A few days before the procedure - follow a low-fiber diet. Avoid nuts, seeds, dried fruit, raw fruits, and vegetables.  1-3 days before the procedure - follow a clear liquid diet. Drink only clear liquids, such as clear broth or bouillon, black coffee or tea, clear juice, clear soft drinks or sports drinks, gelatin dessert, and popsicles. Avoid any liquids that contain red or purple dye.  On the day of the procedure - do not eat or drink anything during the 2 hours before the procedure, or within the time period that your health care provider recommends.  Bowel prep If you were prescribed an oral bowel prep to clean out your colon:  Take it as told by your health care provider. Starting the  day before your procedure, you will need to drink a large amount of medicated liquid. The liquid will cause you to have multiple loose stools until your stool is almost clear or light green.  If your skin or anus gets irritated from diarrhea, you may use these to relieve the irritation: ? Medicated wipes, such as adult wet wipes with aloe and vitamin E. ? A skin soothing-product like petroleum jelly.  If you vomit while drinking the bowel prep, take a break for up to 60 minutes and then begin the bowel prep  again. If vomiting continues and you cannot take the bowel prep without vomiting, call your health care provider.  General instructions  Ask your health care provider about changing or stopping your regular medicines. This is especially important if you are taking diabetes medicines or blood thinners.  Plan to have someone take you home from the hospital or clinic. What happens during the procedure?  An IV tube may be inserted into one of your veins.  You will be given medicine to help you relax (sedative).  To reduce your risk of infection: ? Your health care team will wash or sanitize their hands. ? Your anal area will be washed with soap.  You will be asked to lie on your side with your knees bent.  Your health care provider will lubricate a long, thin, flexible tube. The tube will have a camera and a light on the end.  The tube will be inserted into your anus.  The tube will be gently eased through your rectum and colon.  Air will be delivered into your colon to keep it open. You may feel some pressure or cramping.  The camera will be used to take images during the procedure.  A small tissue sample may be removed from your body to be examined under a microscope (biopsy). If any potential problems are found, the tissue will be sent to a lab for testing.  If small polyps are found, your health care provider may remove them and have them checked for cancer cells.  The tube that was inserted into your anus will be slowly removed. The procedure may vary among health care providers and hospitals. What happens after the procedure?  Your blood pressure, heart rate, breathing rate, and blood oxygen level will be monitored until the medicines you were given have worn off.  Do not drive for 24 hours after the exam.  You may have a small amount of blood in your stool.  You may pass gas and have mild abdominal cramping or bloating due to the air that was used to inflate your colon  during the exam.  It is up to you to get the results of your procedure. Ask your health care provider, or the department performing the procedure, when your results will be ready. This information is not intended to replace advice given to you by your health care provider. Make sure you discuss any questions you have with your health care provider. Document Released: 11/07/2000 Document Revised: 09/10/2016 Document Reviewed: 01/22/2016 Elsevier Interactive Patient Education  2018 [employer].  Colonoscopy, Adult, Care After This sheet gives you information about how to care for yourself after your procedure. Your health care provider may also give you more specific instructions. If you have problems or questions, contact your health care provider. What can I expect after the procedure? After the procedure, it is common to have:  A small amount of blood in your stool for 24 hours  after the procedure.  Some gas.  Mild abdominal cramping or bloating.  Follow these instructions at home: General instructions   For the first 24 hours after the procedure: ? Do not drive or use machinery. ? Do not sign important documents. ? Do not drink alcohol. ? Do your regular daily activities at a slower pace than normal. ? Eat soft, easy-to-digest foods. ? Rest often.  Take over-the-counter or prescription medicines only as told by your health care provider.  It is up to you to get the results of your procedure. Ask your health care provider, or the department performing the procedure, when your results will be ready. Relieving cramping and bloating  Try walking around when you have cramps or feel bloated.  Apply heat to your abdomen as told by your health care provider. Use a heat source that your health care provider recommends, such as a moist heat pack or a heating pad. ? Place a towel between your skin and the heat source. ? Leave the heat on for 20-30 minutes. ? Remove the heat if your  skin turns bright red. This is especially important if you are unable to feel pain, heat, or cold. You may have a greater risk of getting burned. Eating and drinking  Drink enough fluid to keep your urine clear or pale yellow.  Resume your normal diet as instructed by your health care provider. Avoid heavy or fried foods that are hard to digest.  Avoid drinking alcohol for as long as instructed by your health care provider. Contact a health care provider if:  You have blood in your stool 2-3 days after the procedure. Get help right away if:  You have more than a small spotting of blood in your stool.  You pass large blood clots in your stool.  Your abdomen is swollen.  You have nausea or vomiting.  You have a fever.  You have increasing abdominal pain that is not relieved with medicine. This information is not intended to replace advice given to you by your health care provider. Make sure you discuss any questions you have with your health care provider. Document Released: 06/24/2004 Document Revised: 08/04/2016 Document Reviewed: 01/22/2016 Elsevier Interactive Patient Education  2018 [place] Anesthesia is a term that refers to techniques, procedures, and medicines that help a person stay safe and comfortable during a medical procedure. Monitored anesthesia care, or sedation, is one type of anesthesia. Your anesthesia specialist may recommend sedation if you will be having a procedure that does not require you to be unconscious, such as:  Cataract surgery.  A dental procedure.  A biopsy.  A colonoscopy.  During the procedure, you may receive a medicine to help you relax (sedative). There are three levels of sedation:  Mild sedation. At this level, you may feel awake and relaxed. You will be able to follow directions.  Moderate sedation. At this level, you will be sleepy. You may not remember the procedure.  Deep sedation. At this level,  you will be asleep. You will not remember the procedure.  The more medicine you are given, the deeper your level of sedation will be. Depending on how you respond to the procedure, the anesthesia specialist may change your level of sedation or the type of anesthesia to fit your needs. An anesthesia specialist will monitor you closely during the procedure. Let your health care provider know about:  Any allergies you have.  All medicines you are taking, including vitamins, herbs,  eye drops, creams, and over-the-counter medicines.  Any use of steroids (by mouth or as a cream).  Any problems you or family members have had with sedatives and anesthetic medicines.  Any blood disorders you have.  Any surgeries you have had.  Any medical conditions you have, such as sleep apnea.  Whether you are pregnant or may be pregnant.  Any use of cigarettes, alcohol, or street drugs. What are the risks? Generally, this is a safe procedure. However, problems may occur, including:  Getting too much medicine (oversedation).  Nausea.  Allergic reaction to medicines.  Trouble breathing. If this happens, a breathing tube may be used to help with breathing. It will be removed when you are awake and breathing on your own.  Heart trouble.  Lung trouble.  Before the procedure Staying hydrated Follow instructions from your health care provider about hydration, which may include:  Up to 2 hours before the procedure - you may continue to drink clear liquids, such as water, clear fruit juice, black coffee, and plain tea.  Eating and drinking restrictions Follow instructions from your health care provider about eating and drinking, which may include:  8 hours before the procedure - stop eating heavy meals or foods such as meat, fried foods, or fatty foods.  6 hours before the procedure - stop eating light meals or foods, such as toast or cereal.  6 hours before the procedure - stop drinking milk or  drinks that contain milk.  2 hours before the procedure - stop drinking clear liquids.  Medicines Ask your health care provider about:  Changing or stopping your regular medicines. This is especially important if you are taking diabetes medicines or blood thinners.  Taking medicines such as aspirin and ibuprofen. These medicines can thin your blood. Do not take these medicines before your procedure if your health care provider instructs you not to.  Tests and exams  You will have a physical exam.  You may have blood tests done to show: ? How well your kidneys and liver are working. ? How well your blood can clot.  General instructions  Plan to have someone take you home from the hospital or clinic.  If you will be going home right after the procedure, plan to have someone with you for 24 hours.  What happens during the procedure?  Your blood pressure, heart rate, breathing, level of pain and overall condition will be monitored.  An IV tube will be inserted into one of your veins.  Your anesthesia specialist will give you medicines as needed to keep you comfortable during the procedure. This may mean changing the level of sedation.  The procedure will be performed. After the procedure  Your blood pressure, heart rate, breathing rate, and blood oxygen level will be monitored until the medicines you were given have worn off.  Do not drive for 24 hours if you received a sedative.  You may: ? Feel sleepy, clumsy, or nauseous. ? Feel forgetful about what happened after the procedure. ? Have a sore throat if you had a breathing tube during the procedure. ? Vomit. This information is not intended to replace advice given to you by your health care provider. Make sure you discuss any questions you have with your health care provider. Document Released: 08/06/2005 Document Revised: 04/18/2016 Document Reviewed: 03/02/2016 Elsevier Interactive Patient Education  2018 [place], Care After These instructions provide you with information about caring for yourself after your procedure. Your health  care provider may also give you more specific instructions. Your treatment has been planned according to current medical practices, but problems sometimes occur. Call your health care provider if you have any problems or questions after your procedure. What can I expect after the procedure? After your procedure, it is common to:  Feel sleepy for several hours.  Feel clumsy and have poor balance for several hours.  Feel forgetful about what happened after the procedure.  Have poor judgment for several hours.  Feel nauseous or vomit.  Have a sore throat if you had a breathing tube during the procedure.  Follow these instructions at home: For at least 24 hours after the procedure:   Do not: ? Participate in activities in which you could fall or become injured. ? Drive. ? Use heavy machinery. ? Drink alcohol. ? Take sleeping pills or medicines that cause drowsiness. ? Make important decisions or sign legal documents. ? Take care of children on your own.  Rest. Eating and drinking  Follow the diet that is recommended by your health care provider.  If you vomit, drink water, juice, or soup when you can drink without vomiting.  Make sure you have little or no nausea before eating solid foods. General instructions  Have a responsible adult stay with you until you are awake and alert.  Take over-the-counter and prescription medicines only as told by your health care provider.  If you smoke, do not smoke without supervision.  Keep all follow-up visits as told by your health care provider. This is important. Contact a health care provider if:  You keep feeling nauseous or you keep vomiting.  You feel light-headed.  You develop a rash.  You have a fever. Get help right away if:  You have trouble breathing. This  information is not intended to replace advice given to you by your health care provider. Make sure you discuss any questions you have with your health care provider. Document Released: 03/02/2016 Document Revised: 07/02/2016 Document Reviewed: 03/02/2016 Elsevier Interactive Patient Education  [employer].

## 2017-10-27 ENCOUNTER — Encounter (HOSPITAL_COMMUNITY): Payer: Self-pay

## 2017-10-27 ENCOUNTER — Encounter (HOSPITAL_COMMUNITY)
Admission: RE | Admit: 2017-10-27 | Discharge: 2017-10-27 | Disposition: A | Discharge status: Home / Self Care | Payer: BLUE CROSS/BLUE SHIELD | Source: Ambulatory Visit | Attending: Internal Medicine | Admitting: Internal Medicine

## 2017-10-27 ENCOUNTER — Other Ambulatory Visit: Payer: Self-pay

## 2017-10-27 DIAGNOSIS — Z01810 Encounter for preprocedural cardiovascular examination: Secondary | ICD-10-CM | POA: Diagnosis present

## 2017-10-27 DIAGNOSIS — R195 Other fecal abnormalities: Secondary | ICD-10-CM | POA: Diagnosis not present

## 2017-10-27 DIAGNOSIS — Z01812 Encounter for preprocedural laboratory examination: Secondary | ICD-10-CM | POA: Diagnosis not present

## 2017-10-27 HISTORY — DX: Anemia, unspecified: D64.9

## 2017-10-27 HISTORY — DX: Pure hypercholesterolemia, unspecified: E78.00

## 2017-10-27 LAB — CBC WITH DIFFERENTIAL/PLATELET
BASOS PCT: 0 %
Basophils Absolute: 0 10*3/uL (ref 0.0–0.1)
EOS ABS: 0.2 10*3/uL (ref 0.0–0.7)
Eosinophils Relative: 2 %
HEMATOCRIT: 48.5 % (ref 39.0–52.0)
HEMOGLOBIN: 16.1 g/dL (ref 13.0–17.0)
LYMPHS ABS: 1.9 10*3/uL (ref 0.7–4.0)
Lymphocytes Relative: 19 %
MCH: 30.2 pg (ref 26.0–34.0)
MCHC: 33.2 g/dL (ref 30.0–36.0)
MCV: 91 fL (ref 78.0–100.0)
MONOS PCT: 11 %
Monocytes Absolute: 1.2 10*3/uL — ABNORMAL HIGH (ref 0.1–1.0)
NEUTROS ABS: 7 10*3/uL (ref 1.7–7.7)
NEUTROS PCT: 68 %
Platelets: 270 10*3/uL (ref 150–400)
RBC: 5.33 MIL/uL (ref 4.22–5.81)
RDW: 17.6 % — ABNORMAL HIGH (ref 11.5–15.5)
WBC: 10.2 10*3/uL (ref 4.0–10.5)

## 2017-10-27 LAB — BASIC METABOLIC PANEL
Anion gap: 8 (ref 5–15)
BUN: 15 mg/dL (ref 6–20)
CHLORIDE: 99 mmol/L — AB (ref 101–111)
CO2: 29 mmol/L (ref 22–32)
CREATININE: 0.88 mg/dL (ref 0.61–1.24)
Calcium: 9.5 mg/dL (ref 8.9–10.3)
GFR calc non Af Amer: >60 mL/min (ref >60)
Glucose, Bld: 113 mg/dL — ABNORMAL HIGH (ref 65–99)
POTASSIUM: 3.7 mmol/L (ref 3.5–5.1)
SODIUM: 136 mmol/L (ref 135–145)

## 2017-10-31 NOTE — Progress Notes (Signed)
Phone call to patient requesting patient come to [facility] on Monday 11/02/2017 at 10:00am instead of 0900 for his procedure due to potential for bad weather.  Patient states "I was going to call and reschedule anyway. My wife will have to drive me home and she and I will be afraid on the travel home".  Patient "sure I do want to reschedule". Dr. Gala Romney and Letta Kocher notified of patient request to reschedule.

## 2017-11-02 ENCOUNTER — Encounter (HOSPITAL_COMMUNITY): Admission: RE | Payer: Self-pay | Source: Ambulatory Visit

## 2017-11-02 ENCOUNTER — Ambulatory Visit (HOSPITAL_COMMUNITY)
Admission: RE | Admit: 2017-11-02 | Payer: BLUE CROSS/BLUE SHIELD | Source: Ambulatory Visit | Admitting: Internal Medicine

## 2017-11-02 SURGERY — COLONOSCOPY WITH PROPOFOL
Anesthesia: Monitor Anesthesia Care

## 2017-11-04 ENCOUNTER — Telehealth: Payer: Self-pay | Admitting: *Deleted

## 2017-11-04 NOTE — Telephone Encounter (Signed)
Patient was scheduled for TCS W/ PROPOFOL BY RMR on 11/02/17 and this needs to be r/s'd due to snow. LMOVM

## 2017-11-05 ENCOUNTER — Encounter: Payer: Self-pay | Admitting: *Deleted

## 2017-11-05 ENCOUNTER — Other Ambulatory Visit: Payer: Self-pay | Admitting: *Deleted

## 2017-11-05 DIAGNOSIS — R195 Other fecal abnormalities: Secondary | ICD-10-CM

## 2017-11-05 NOTE — Telephone Encounter (Signed)
Spoke with pt and is aware Pre-op scheduled for Tues 12/15/17 at 9:00am. Letter mailed to pt.

## 2017-11-05 NOTE — Telephone Encounter (Signed)
Spoke with pt and procedure rescheduled to 12/21/17 at 9:45am. New instructions mailed to patient. He will need another pre-op appt per Hoyle Sauer.

## 2017-11-11 ENCOUNTER — Encounter: Payer: Self-pay | Admitting: Family Medicine

## 2017-12-11 ENCOUNTER — Other Ambulatory Visit: Payer: Self-pay | Admitting: Family Medicine

## 2017-12-11 DIAGNOSIS — J302 Other seasonal allergic rhinitis: Secondary | ICD-10-CM

## 2017-12-14 ENCOUNTER — Encounter (HOSPITAL_COMMUNITY): Payer: Self-pay

## 2017-12-14 NOTE — Telephone Encounter (Signed)
Ok to refill??  Last office visit 07/07/2017.  Last refill 09/09/2017, #2 refills.

## 2017-12-15 ENCOUNTER — Encounter (HOSPITAL_COMMUNITY)
Admission: RE | Admit: 2017-12-15 | Discharge: 2017-12-15 | Disposition: A | Discharge status: Home / Self Care | Payer: Self-pay | Source: Ambulatory Visit | Attending: Internal Medicine | Admitting: Internal Medicine

## 2017-12-15 ENCOUNTER — Other Ambulatory Visit (HOSPITAL_COMMUNITY): Payer: BLUE CROSS/BLUE SHIELD

## 2017-12-21 ENCOUNTER — Ambulatory Visit (HOSPITAL_COMMUNITY): Payer: Managed Care, Other (non HMO) | Admitting: Anesthesiology

## 2017-12-21 ENCOUNTER — Other Ambulatory Visit: Payer: Self-pay

## 2017-12-21 ENCOUNTER — Ambulatory Visit (HOSPITAL_COMMUNITY)
Admission: RE | Admit: 2017-12-21 | Discharge: 2017-12-21 | Disposition: A | Discharge status: Home / Self Care | Payer: Managed Care, Other (non HMO) | Source: Ambulatory Visit | Attending: Internal Medicine | Admitting: Internal Medicine

## 2017-12-21 ENCOUNTER — Encounter (HOSPITAL_COMMUNITY): Payer: Self-pay

## 2017-12-21 ENCOUNTER — Encounter (HOSPITAL_COMMUNITY)
Admission: RE | Disposition: A | Discharge status: Home / Self Care | Payer: Self-pay | Source: Ambulatory Visit | Attending: Internal Medicine

## 2017-12-21 DIAGNOSIS — K219 Gastro-esophageal reflux disease without esophagitis: Secondary | ICD-10-CM | POA: Insufficient documentation

## 2017-12-21 DIAGNOSIS — D127 Benign neoplasm of rectosigmoid junction: Secondary | ICD-10-CM | POA: Diagnosis not present

## 2017-12-21 DIAGNOSIS — D128 Benign neoplasm of rectum: Secondary | ICD-10-CM | POA: Insufficient documentation

## 2017-12-21 DIAGNOSIS — R195 Other fecal abnormalities: Secondary | ICD-10-CM

## 2017-12-21 DIAGNOSIS — K648 Other hemorrhoids: Secondary | ICD-10-CM | POA: Diagnosis not present

## 2017-12-21 DIAGNOSIS — M1990 Unspecified osteoarthritis, unspecified site: Secondary | ICD-10-CM | POA: Insufficient documentation

## 2017-12-21 DIAGNOSIS — Z79899 Other long term (current) drug therapy: Secondary | ICD-10-CM | POA: Insufficient documentation

## 2017-12-21 DIAGNOSIS — F17210 Nicotine dependence, cigarettes, uncomplicated: Secondary | ICD-10-CM | POA: Insufficient documentation

## 2017-12-21 DIAGNOSIS — K5732 Diverticulitis of large intestine without perforation or abscess without bleeding: Secondary | ICD-10-CM | POA: Diagnosis not present

## 2017-12-21 DIAGNOSIS — I10 Essential (primary) hypertension: Secondary | ICD-10-CM | POA: Insufficient documentation

## 2017-12-21 DIAGNOSIS — E7800 Pure hypercholesterolemia, unspecified: Secondary | ICD-10-CM | POA: Diagnosis not present

## 2017-12-21 DIAGNOSIS — F419 Anxiety disorder, unspecified: Secondary | ICD-10-CM | POA: Insufficient documentation

## 2017-12-21 DIAGNOSIS — K649 Unspecified hemorrhoids: Secondary | ICD-10-CM

## 2017-12-21 DIAGNOSIS — K921 Melena: Secondary | ICD-10-CM

## 2017-12-21 HISTORY — PX: POLYPECTOMY: SHX5525

## 2017-12-21 HISTORY — PX: COLONOSCOPY WITH PROPOFOL: SHX5780

## 2017-12-21 SURGERY — COLONOSCOPY WITH PROPOFOL
Anesthesia: Monitor Anesthesia Care

## 2017-12-21 MED ORDER — CHLORHEXIDINE GLUCONATE CLOTH 2 % EX PADS: 6.0000 | MEDICATED_PAD | Freq: Once | CUTANEOUS | Status: DC

## 2017-12-21 MED ORDER — PROPOFOL 10 MG/ML IV BOLUS
INTRAVENOUS | Status: DC | PRN
  Administered 2017-12-21: 10 mg via INTRAVENOUS
  Administered 2017-12-21 (×3): 20 mg via INTRAVENOUS

## 2017-12-21 MED ORDER — LACTATED RINGERS IV SOLN
INTRAVENOUS | Status: DC
Start: 2017-12-21 — End: 2017-12-21
  Administered 2017-12-21: 09:00:00 via INTRAVENOUS

## 2017-12-21 MED ORDER — MIDAZOLAM HCL 2 MG/2ML IJ SOLN
INTRAMUSCULAR | Status: AC
  Filled 2017-12-21: qty 2

## 2017-12-21 MED ORDER — PROPOFOL 500 MG/50ML IV EMUL
INTRAVENOUS | Status: DC | PRN
  Administered 2017-12-21: 175 ug/kg/min via INTRAVENOUS

## 2017-12-21 MED ORDER — MIDAZOLAM HCL 5 MG/5ML IJ SOLN
INTRAMUSCULAR | Status: DC | PRN
  Administered 2017-12-21: 2 mg via INTRAVENOUS

## 2017-12-21 MED ORDER — MIDAZOLAM HCL 2 MG/2ML IJ SOLN
1.0000 mg | Freq: Once | INTRAMUSCULAR | Status: AC | PRN
  Administered 2017-12-21: 2 mg via INTRAVENOUS

## 2017-12-21 MED ORDER — FENTANYL CITRATE (PF) 100 MCG/2ML IJ SOLN
INTRAMUSCULAR | Status: AC
  Filled 2017-12-21: qty 2

## 2017-12-21 MED ORDER — PROPOFOL 10 MG/ML IV BOLUS
INTRAVENOUS | Status: AC
  Filled 2017-12-21: qty 20

## 2017-12-21 MED ORDER — LIDOCAINE HCL 1 % IJ SOLN
INTRAMUSCULAR | Status: DC | PRN
  Administered 2017-12-21: 10 mg via INTRADERMAL

## 2017-12-21 MED ORDER — LIDOCAINE HCL (PF) 1 % IJ SOLN
INTRAMUSCULAR | Status: AC
  Filled 2017-12-21: qty 5

## 2017-12-21 MED ORDER — PROPOFOL 10 MG/ML IV BOLUS
INTRAVENOUS | Status: AC
  Filled 2017-12-21: qty 40

## 2017-12-21 MED ORDER — ONDANSETRON 4 MG PO TBDP
ORAL_TABLET | ORAL | Status: AC
  Filled 2017-12-21: qty 1

## 2017-12-21 MED ORDER — ONDANSETRON 4 MG PO TBDP
4.0000 mg | ORAL_TABLET | Freq: Once | ORAL | Status: AC
  Administered 2017-12-21: 4 mg via ORAL

## 2017-12-21 NOTE — Transfer of Care (Signed)
Immediate Anesthesia Transfer of Care Note  Patient: [name]  Procedure(s) Performed: COLONOSCOPY WITH PROPOFOL (N/A ) POLYPECTOMY  Patient Location: PACU  Anesthesia Type:MAC  Level of Consciousness: awake, alert  and oriented  Airway & Oxygen Therapy: Patient Spontanous Breathing  Post-op Assessment: Report given to RN and Post -op Vital signs reviewed and stable  Post vital signs: Reviewed and stable  Last Vitals:  Vitals:   12/21/17 0915 12/21/17 1015  BP: 124/78 (!) 128/93  Pulse:  98  Resp: 19 (!) 22  Temp:  36.6 C  SpO2: 94% 95%    Last Pain:  Vitals:   12/21/17 0809  TempSrc: Oral         Complications: No apparent anesthesia complications

## 2017-12-21 NOTE — Op Note (Signed)
[facility] Patient Name: [name] Procedure Date: 12/21/2017 8:24 AM MRN: [id number] Date of Birth: May 04, 1966 Attending MD: Norvel Richards , MD CSN: [id number] Age: 52 Admit Type: Outpatient Procedure:                Colonoscopy Indications:              Hematochezia Providers:                Norvel Richards, MD, Jeanann Lewandowsky. Sharon Seller, RN,                            Randa Spike, Technician Referring MD:              Medicines:                Propofol per Anesthesia Complications:            No immediate complications. Estimated Blood Loss:     Estimated blood loss: none. Procedure:                Pre-Anesthesia Assessment:                           - Prior to the procedure, a History and Physical                            was performed, and patient medications and                            allergies were reviewed. The patient's tolerance of                            previous anesthesia was also reviewed. The risks                            and benefits of the procedure and the sedation                            options and risks were discussed with the patient.                            All questions were answered, and informed consent                            was obtained. Prior Anticoagulants: The patient has                            taken no previous anticoagulant or antiplatelet                            agents. ASA Grade Assessment: II - A patient with                            mild systemic disease. After reviewing the risks  and benefits, the patient was deemed in                            satisfactory condition to undergo the procedure.                           After obtaining informed consent, the colonoscope                            was passed under direct vision. Throughout the                            procedure, the patient's blood pressure, pulse, and                            oxygen saturations were  monitored continuously. The                            EC-[id number] ([id number]) scope was introduced through                            the and advanced to the the cecum, identified by                            appendiceal orifice and ileocecal valve. The                            colonoscopy was performed without difficulty. The                            ileocecal valve, appendiceal orifice, and rectum                            were photographed. The entire colon was well                            visualized. The quality of the bowel preparation                            was adequate. Scope In: 9:53:14 AM Scope Out: 10:08:17 AM Scope Withdrawal Time: 0 hours 11 minutes 53 seconds  Total Procedure Duration: 0 hours 15 minutes 3 seconds  Findings:      The perianal and digital rectal examinations were normal.      Two semi-pedunculated polyps were found in the recto-sigmoid colon. The       polyps were 7 to 8 mm in size. These polyps were removed with a hot       snare. Resection and retrieval were complete. Estimated blood loss: none.      One medium-mouthed diverticulum was found in the ascending colon.      Non-bleeding hemorrhoids were found during retroflexion. The hemorrhoids       were mild and medium-sized. Site of prior surgical anastomosis is not       apparent. Impression:               -  Two 7 to 8 mm polyps at the recto-sigmoid colon,                            removed with a hot snare. Resected and retrieved.                           - Diverticulosis in the ascending colon.                           - Non-bleeding hemorrhoids. Moderate Sedation:      Moderate (conscious) sedation was personally administered by an       anesthesia professional. The following parameters were monitored: oxygen       saturation, heart rate, blood pressure, respiratory rate, EKG, adequacy       of pulmonary ventilation, and response to care. Total physician       intraservice time was 22  minutes. Recommendation:           - Patient has a contact number available for                            emergencies. The signs and symptoms of potential                            delayed complications were discussed with the                            patient. Return to normal activities tomorrow.                            Written discharge instructions were provided to the                            patient.                           - Patient has a contact number available for                            emergencies. The signs and symptoms of potential                            delayed complications were discussed with the                            patient. Return to normal activities tomorrow.                            Written discharge instructions were provided to the                            patient.                           - Resume previous diet.                           -  Continue present medications.                           - Repeat colonoscopy date to be determined after                            pending pathology results are reviewed for                            surveillance based on pathology results.                           - Return to GI office (date not yet determined). Procedure Code(s):        --- Professional ---                           [phone], Colonoscopy, flexible; with removal of                            tumor(s), polyp(s), or other lesion(s) by snare                            technique Diagnosis Code(s):        --- Professional ---                           D12.7, Benign neoplasm of rectosigmoid junction                           K64.9, Unspecified hemorrhoids                           K92.1, Melena (includes Hematochezia)                           K57.30, Diverticulosis of large intestine without                            perforation or abscess without bleeding CPT copyright 2016 American Medical Association. All rights reserved. The codes  documented in this report are preliminary and upon coder review may  be revised to meet current compliance requirements. Cristopher Estimable. Nalin Mazzocco, MD Norvel Richards, MD 12/21/2017 10:17:22 AM This report has been signed electronically. Number of Addenda: 0

## 2017-12-21 NOTE — Anesthesia Postprocedure Evaluation (Signed)
Anesthesia Post Note  Patient: [name]  Procedure(s) Performed: COLONOSCOPY WITH PROPOFOL (N/A ) POLYPECTOMY  Patient location during evaluation: PACU Anesthesia Type: MAC Level of consciousness: awake and alert and oriented Pain management: pain level controlled Vital Signs Assessment: post-procedure vital signs reviewed and stable Respiratory status: spontaneous breathing Cardiovascular status: blood pressure returned to baseline and stable Postop Assessment: no apparent nausea or vomiting Anesthetic complications: no     Last Vitals:  Vitals:   12/21/17 0915 12/21/17 1015  BP: 124/78 (!) 128/93  Pulse:  98  Resp: 19 (!) 22  Temp:  36.6 C  SpO2: 94% 95%    Last Pain:  Vitals:   12/21/17 0809  TempSrc: Oral                 Saanvi Hakala

## 2017-12-21 NOTE — Discharge Instructions (Signed)
Colonoscopy Discharge Instructions  Read the instructions outlined below and refer to this sheet in the next few weeks. These discharge instructions provide you with general information on caring for yourself after you leave the hospital. Your doctor may also give you specific instructions. While your treatment has been planned according to the most current medical practices available, unavoidable complications occasionally occur. If you have any problems or questions after discharge, call Dr. Gala Romney at [phone]. ACTIVITY  You may resume your regular activity, but move at a slower pace for the next 24 hours.   Take frequent rest periods for the next 24 hours.   Walking will help get rid of the air and reduce the bloated feeling in your belly (abdomen).   No driving for 24 hours (because of the medicine (anesthesia) used during the test).    Do not sign any important legal documents or operate any machinery for 24 hours (because of the anesthesia used during the test).  NUTRITION  Drink plenty of fluids.   You may resume your normal diet as instructed by your doctor.   Begin with a light meal and progress to your normal diet. Heavy or fried foods are harder to digest and may make you feel sick to your stomach (nauseated).   Avoid alcoholic beverages for 24 hours or as instructed.  MEDICATIONS  You may resume your normal medications unless your doctor tells you otherwise.  WHAT YOU CAN EXPECT TODAY  Some feelings of bloating in the abdomen.   Passage of more gas than usual.   Spotting of blood in your stool or on the toilet paper.  IF YOU HAD POLYPS REMOVED DURING THE COLONOSCOPY:  No aspirin products for 7 days or as instructed.   No alcohol for 7 days or as instructed.   Eat a soft diet for the next 24 hours.  FINDING OUT THE RESULTS OF YOUR TEST Not all test results are available during your visit. If your test results are not back during the visit, make an appointment  with your caregiver to find out the results. Do not assume everything is normal if you have not heard from your caregiver or the medical facility. It is important for you to follow up on all of your test results.  SEEK IMMEDIATE MEDICAL ATTENTION IF:  You have more than a spotting of blood in your stool.   Your belly is swollen (abdominal distention).   You are nauseated or vomiting.   You have a temperature over 101.   You have abdominal pain or discomfort that is severe or gets worse throughout the day.   Colon polyp and hemorrhoid information provided pamphlet on hemorrhoid banding provided  Further recommendations to follow pending review of pathology report    PATIENT INSTRUCTIONS POST-ANESTHESIA  IMMEDIATELY FOLLOWING SURGERY:  Do not drive or operate machinery for the first twenty four hours after surgery.  Do not make any important decisions for twenty four hours after surgery or while taking narcotic pain medications or sedatives.  If you develop intractable nausea and vomiting or a severe headache please notify your doctor immediately.  FOLLOW-UP:  Please make an appointment with your surgeon as instructed. You do not need to follow up with anesthesia unless specifically instructed to do so.  WOUND CARE INSTRUCTIONS (if applicable):  Keep a dry clean dressing on the anesthesia/puncture wound site if there is drainage.  Once the wound has quit draining you may leave it open to air.  Generally you should leave  the bandage intact for twenty four hours unless there is drainage.  If the epidural site drains for more than 36-48 hours please call the anesthesia department.  QUESTIONS?:  Please feel free to call your physician or the hospital operator if you have any questions, and they will be happy to assist you.        Colon Polyps Polyps are tissue growths inside the body. Polyps can grow in many places, including the large intestine (colon). A polyp may be a round bump or a  mushroom-shaped growth. You could have one polyp or several. Most colon polyps are noncancerous (benign). However, some colon polyps can become cancerous over time. What are the causes? The exact cause of colon polyps is not known. What increases the risk? This condition is more likely to develop in people who:  Have a family history of colon cancer or colon polyps.  Are older than 52 or older than 45 if they are African American.  Have inflammatory bowel disease, such as ulcerative colitis or Crohn disease.  Are overweight.  Smoke cigarettes.  Do not get enough exercise.  Drink too much alcohol.  Eat a diet that is: ? High in fat and red meat. ? Low in fiber.  Had childhood cancer that was treated with abdominal radiation.  What are the signs or symptoms? Most polyps do not cause symptoms. If you have symptoms, they may include:  Blood coming from your rectum when having a bowel movement.  Blood in your stool.The stool may look dark red or black.  A change in bowel habits, such as constipation or diarrhea.  How is this diagnosed? This condition is diagnosed with a colonoscopy. This is a procedure that uses a lighted, flexible scope to look at the inside of your colon. How is this treated? Treatment for this condition involves removing any polyps that are found. Those polyps will then be tested for cancer. If cancer is found, your health care provider will talk to you about options for colon cancer treatment. Follow these instructions at home: Diet  Eat plenty of fiber, such as fruits, vegetables, and whole grains.  Eat foods that are high in calcium and vitamin D, such as milk, cheese, yogurt, eggs, liver, fish, and broccoli.  Limit foods high in fat, red meats, and processed meats, such as hot dogs, sausage, bacon, and lunch meats.  Maintain a healthy weight, or lose weight if recommended by your health care provider. General instructions  Do not smoke  cigarettes.  Do not drink alcohol excessively.  Keep all follow-up visits as told by your health care provider. This is important. This includes keeping regularly scheduled colonoscopies. Talk to your health care provider about when you need a colonoscopy.  Exercise every day or as told by your health care provider. Contact a health care provider if:  You have new or worsening bleeding during a bowel movement.  You have new or increased blood in your stool.  You have a change in bowel habits.  You unexpectedly lose weight. This information is not intended to replace advice given to you by your health care provider. Make sure you discuss any questions you have with your health care provider. Document Released: 08/06/2004 Document Revised: 04/17/2016 Document Reviewed: 10/01/2015 Elsevier Interactive Patient Education  2018 [employer].   Hemorrhoids Hemorrhoids are swollen veins in and around the rectum or anus. There are two types of hemorrhoids:  Internal hemorrhoids. These occur in the veins that are just inside the  rectum. They may poke through to the outside and become irritated and painful.  External hemorrhoids. These occur in the veins that are outside of the anus and can be felt as a painful swelling or hard lump near the anus.  Most hemorrhoids do not cause serious problems, and they can be managed with home treatments such as diet and lifestyle changes. If home treatments do not help your symptoms, procedures can be done to shrink or remove the hemorrhoids. What are the causes? This condition is caused by increased pressure in the anal area. This pressure may result from various things, including:  Constipation.  Straining to have a bowel movement.  Diarrhea.  Pregnancy.  Obesity.  Sitting for long periods of time.  Heavy lifting or other activity that causes you to strain.  Anal sex.  What are the signs or symptoms? Symptoms of this condition  include:  Pain.  Anal itching or irritation.  Rectal bleeding.  Leakage of stool (feces).  Anal swelling.  One or more lumps around the anus.  How is this diagnosed? This condition can often be diagnosed through a visual exam. Other exams or tests may also be done, such as:  Examination of the rectal area with a gloved hand (digital rectal exam).  Examination of the anal canal using a small tube (anoscope).  A blood test, if you have lost a significant amount of blood.  A test to look inside the colon (sigmoidoscopy or colonoscopy).  How is this treated? This condition can usually be treated at home. However, various procedures may be done if dietary changes, lifestyle changes, and other home treatments do not help your symptoms. These procedures can help make the hemorrhoids smaller or remove them completely. Some of these procedures involve surgery, and others do not. Common procedures include:  Rubber band ligation. Rubber bands are placed at the base of the hemorrhoids to cut off the blood supply to them.  Sclerotherapy. Medicine is injected into the hemorrhoids to shrink them.  Infrared coagulation. A type of light energy is used to get rid of the hemorrhoids.  Hemorrhoidectomy surgery. The hemorrhoids are surgically removed, and the veins that supply them are tied off.  Stapled hemorrhoidopexy surgery. A circular stapling device is used to remove the hemorrhoids and use staples to cut off the blood supply to them.  Follow these instructions at home: Eating and drinking  Eat foods that have a lot of fiber in them, such as whole grains, beans, nuts, fruits, and vegetables. Ask your health care provider about taking products that have added fiber (fiber supplements).  Drink enough fluid to keep your urine clear or pale yellow. Managing pain and swelling  Take warm sitz baths for 20 minutes, 3-4 times a day to ease pain and discomfort.  If directed, apply ice to the  affected area. Using ice packs between sitz baths may be helpful. ? Put ice in a plastic bag. ? Place a towel between your skin and the bag. ? Leave the ice on for 20 minutes, 2-3 times a day. General instructions  Take over-the-counter and prescription medicines only as told by your health care provider.  Use medicated creams or suppositories as told.  Exercise regularly.  Go to the bathroom when you have the urge to have a bowel movement. Do not wait.  Avoid straining to have bowel movements.  Keep the anal area dry and clean. Use wet toilet paper or moist towelettes after a bowel movement.  Do not sit  on the toilet for long periods of time. This increases blood pooling and pain. Contact a health care provider if:  You have increasing pain and swelling that are not controlled by treatment or medicine.  You have uncontrolled bleeding.  You have difficulty having a bowel movement, or you are unable to have a bowel movement.  You have pain or inflammation outside the area of the hemorrhoids. This information is not intended to replace advice given to you by your health care provider. Make sure you discuss any questions you have with your health care provider. Document Released: 11/07/2000 Document Revised: 04/09/2016 Document Reviewed: 07/25/2015 Elsevier Interactive Patient Education  [employer].

## 2017-12-21 NOTE — Anesthesia Preprocedure Evaluation (Signed)
Anesthesia Evaluation  Patient identified by MRN, date of birth, ID band Patient awake    Airway Mallampati: III  TM Distance: >3 FB Neck ROM: Full    Dental no notable dental hx. (+) Teeth Intact   Pulmonary Current Smoker,    Pulmonary exam normal        Cardiovascular Exercise Tolerance: Good hypertension, Pt. on medications and Pt. on home beta blockers  Rhythm:Regular Rate:Normal     Neuro/Psych PSYCHIATRIC DISORDERS Anxiety    GI/Hepatic GERD  Medicated,  Endo/Other    Renal/GU      Musculoskeletal  (+) Arthritis ,   Abdominal   Peds  Hematology   Anesthesia Other Findings   Reproductive/Obstetrics                             Anesthesia Physical Anesthesia Plan  ASA: III  Anesthesia Plan: MAC   Post-op Pain Management:    Induction:   PONV Risk Score and Plan:   Airway Management Planned:   Additional Equipment:   Intra-op Plan:   Post-operative Plan:   Informed Consent: I have reviewed the patients History and Physical, chart, labs and discussed the procedure including the risks, benefits and alternatives for the proposed anesthesia with the patient or authorized representative who has indicated his/her understanding and acceptance.   Dental advisory given  Plan Discussed with: CRNA  Anesthesia Plan Comments:         Anesthesia Quick Evaluation

## 2017-12-21 NOTE — H&P (Signed)
@LOGO @   Primary Care Physician:  Susy Frizzle, MD Primary Gastroenterologist:  Dr. Gala Romney  Pre-Procedure History & Physical: HPI:  [name] is a 52 y.o. male here for  further evaluation of rectal bleeding via colonoscopy. No prior colonoscopy. History of a segmental resection, diverticulitis with perforation.  Past Medical History:  Diagnosis Date  . Allergy   . Anemia   . Colonic diverticular abscess 12/26/2014  . Diverticulitis   . ETOH abuse   . GAD (generalized anxiety disorder)   . GERD (gastroesophageal reflux disease)   . Hypercholesteremia   . Hypertension     Past Surgical History:  Procedure Laterality Date  . APPENDECTOMY N/A 01/01/2015   Procedure: INCIDENTAL APPENDECTOMY;  Surgeon: Jamesetta So, MD;  Location: [facility];  Service: General;  Laterality: N/A;  . BACK SURGERY     lower back, fusion  . GASTRECTOMY N/A 04/14/2016   Procedure: Toniann Ket;  Surgeon: Aviva Signs, MD;  Location: [facility];  Service: General;  Laterality: N/A;  . HEMORRHOID SURGERY    . HERNIA REPAIR     as child  . PARTIAL COLECTOMY N/A 01/01/2015   Procedure: PARTIAL COLECTOMY;  Surgeon: Jamesetta So, MD;  Location: [facility];  Service: General;  Laterality: N/A;  . ROTATOR CUFF REPAIR Bilateral     Prior to Admission medications   Medication Sig Start Date End Date Taking? Authorizing Provider  acetaminophen (TYLENOL) 500 MG tablet Take 500 mg by mouth every 6 (six) hours as needed for moderate pain or headache.   Yes [provider]  amLODipine (NORVASC) 10 MG tablet Take 1 tablet (10 mg total) by mouth daily. This replaces metoprolol 08/17/17  Yes [place], Modena Nunnery, MD  atorvastatin (LIPITOR) 40 MG tablet Take 1 tablet (40 mg total) by mouth daily. 09/09/16  Yes Susy Frizzle, MD  carvedilol (COREG) 12.5 MG tablet Take 1 tablet (12.5 mg total) by mouth 2 (two) times daily with a meal. 08/17/17  Yes [place], Modena Nunnery, MD  clonazePAM (KLONOPIN) 0.5 MG tablet TAKE 1  TABLET BY MOUTH TWICE A DAY AS NEEDED FOR ANXIETY 12/14/17  Yes Susy Frizzle, MD  cyclobenzaprine (FLEXERIL) 10 MG tablet TAKE 1 TABLET BY MOUTH EVERY DAY AT BEDTIME AS NEEDED Patient taking differently: TAKE 10 MG BY MOUTH EVERY DAY AT BEDTIME AS NEEDED FOR MUSCLE SPASMS 10/13/17  Yes Susy Frizzle, MD  ferrous sulfate 325 (65 FE) MG EC tablet Take 325 mg by mouth daily with breakfast.   Yes [provider]  fluticasone (FLONASE) 50 MCG/ACT nasal spray Place 1 spray into both nostrils daily as needed for allergies.  09/15/17  Yes [provider]  fluticasone (FLONASE) 50 MCG/ACT nasal spray PLACE 2 SPRAYS INTO BOTH NOSTRILS DAILY. 12/11/17  Yes Susy Frizzle, MD  Homeopathic Products [facility] COLD REMEDY NA) Place 1 spray into both nostrils daily as needed (for allergies).   Yes [provider]  losartan-hydrochlorothiazide (HYZAAR) 50-12.5 MG tablet Take 2 tablets by mouth daily. Patient taking differently: Take 1 tablet by mouth daily.  08/17/17  Yes [place], Modena Nunnery, MD  pantoprazole (PROTONIX) 40 MG tablet Take 1 tablet (40 mg total) by mouth 2 (two) times daily. 08/17/17  Yes , Mod[place] Nunnery, MD  venlafaxine XR (EFFEXOR-XR) 150 MG 24 hr capsule TAKE 1 CAPSULE BY MOUTH IN THE MORNING Patient taking differently: TAKE 150 MG BY MOUTH IN THE MORNING 07/28/17  Yes Susy Frizzle, MD    Allergies as  of 11/05/2017  . (No Known Allergies)    Family History  Problem Relation Age of Onset  . Stroke Mother   . CAD Father   . Colon cancer Neg Hx     Social History   Socioeconomic History  . Marital status: Married    Spouse name: Not on file  . Number of children: Not on file  . Years of education: Not on file  . Highest education level: Not on file  Social Needs  . Financial resource strain: Not on file  . Food insecurity - worry: Not on file  . Food insecurity - inability: Not on file  . Transportation needs - medical: Not on file  .  Transportation needs - non-medical: Not on file  Occupational History  . Not on file  Tobacco Use  . Smoking status: Current Every Day Smoker    Packs/day: 1.00    Years: 35.00    Pack years: 35.00    Types: Cigarettes  . Smokeless tobacco: Never Used  Substance and Sexual Activity  . Alcohol use: Yes    Alcohol/week: 2.4 oz    Types: 4 Cans of beer per week    Comment: patient states "about 5 or 6 a day" (beers)  . Drug use: No  . Sexual activity: Yes  Other Topics Concern  . Not on file  Social History Narrative  . Not on file    Review of Systems: See HPI, otherwise negative ROS  Physical Exam: BP 127/73   Pulse 84   Temp 98 F (36.7 C) (Oral)   Resp 16   Ht 5\' 8"  (1.727 m)   Wt 200 lb (90.7 kg)   SpO2 96%   BMI 30.41 kg/m  General:   Alert,  Well-developed, well-nourished, pleasant and cooperative in NAD Neck:  Supple; no masses or thyromegaly. No significant cervical adenopathy. Lungs:  Clear throughout to auscultation.   No wheezes, crackles, or rhonchi. No acute distress. Heart:  Regular rate and rhythm; no murmurs, clicks, rubs,  or gallops. Abdomen: Non-distended, normal bowel sounds.  Soft and nontender without appreciable mass or hepatosplenomegaly.  Pulses:  Normal pulses noted. Extremities:  Without clubbing or edema.  Impression:   Pleasant 52 year old gentleman with a history of complicated diverticulitis report resection. Now with intermittent rectal bleeding. No prior colonoscopy.   Recommendations:  I have offered the patient a diagnostic colonoscopy today.  The risks, benefits, limitations, alternatives and imponderables have been reviewed with the patient. Questions have been answered. All parties are agreeable.    Notice: This dictation was prepared with Dragon dictation along with smaller phrase technology. Any transcriptional errors that result from this process are unintentional and may not be corrected upon review.

## 2017-12-22 ENCOUNTER — Encounter (HOSPITAL_COMMUNITY): Payer: Self-pay | Admitting: Internal Medicine

## 2018-01-03 ENCOUNTER — Encounter: Payer: Self-pay | Admitting: Internal Medicine

## 2018-01-05 ENCOUNTER — Encounter: Payer: Self-pay | Admitting: Internal Medicine

## 2018-01-23 ENCOUNTER — Other Ambulatory Visit: Payer: Self-pay | Admitting: Family Medicine

## 2018-02-23 ENCOUNTER — Encounter: Payer: Self-pay | Admitting: Internal Medicine

## 2018-02-23 ENCOUNTER — Ambulatory Visit (INDEPENDENT_AMBULATORY_CARE_PROVIDER_SITE_OTHER): Payer: Managed Care, Other (non HMO) | Admitting: Internal Medicine

## 2018-02-23 VITALS — BP 128/85 | HR 81 | Temp 97.3°F | Ht 68.0 in | Wt 208.8 lb

## 2018-02-23 DIAGNOSIS — K648 Other hemorrhoids: Secondary | ICD-10-CM

## 2018-02-23 NOTE — Progress Notes (Signed)
[place] banding procedure note:  The patient presents with symptomatic grade 1/2  hemorrhoids, unresponsive to maximal medical therapy requesting rubber band ligation of his hemorrhoidal disease. All risks, benefits, and alternative forms of therapy were described and informed consent was obtained.  I explained there is no guarantee that any treatment modality will produce 100% resolution in symptoms.  In the left lateral decubitus position, A DRE utilizing nitroglycerin and Xylocaine as lubricant, performed.  It was normal. Anoscopy revealed a prominent left hemorrhoid column; otherwise negative.  The decision was made to band the Left internal hemorrhoid; the [place] was used to perform band ligation without complication. Digital anorectal examination was then performed to assure proper positioning of the band;  Band found to be in excellent position. No pinching or pain. The patient was discharged home without pain or other issues. Dietary and behavioral recommendations were given along with follow-up instructions. The patient will return in 4  for followup and possible additional banding as required.  No complications were encountered and the patient tolerated the procedure well.

## 2018-02-23 NOTE — Patient Instructions (Signed)
Avoid straining.  Benefiber 1 Tablespoon   daily  Limit toilet time to 5 minutes  Call with any interim problems  Schedule followup appointment in 4 weeks from now

## 2018-02-24 ENCOUNTER — Telehealth: Payer: Self-pay | Admitting: Family Medicine

## 2018-02-24 NOTE — Telephone Encounter (Signed)
Error

## 2018-02-25 NOTE — Telephone Encounter (Signed)
This encounter was created in error - please disregard.

## 2018-02-25 NOTE — Addendum Note (Signed)
Addended by: Shary Decamp B on: 02/25/2018 08:25 AM   Modules accepted: Level of Service, SmartSet

## 2018-03-08 ENCOUNTER — Other Ambulatory Visit: Payer: Self-pay | Admitting: Family Medicine

## 2018-03-08 DIAGNOSIS — J302 Other seasonal allergic rhinitis: Secondary | ICD-10-CM

## 2018-03-20 ENCOUNTER — Other Ambulatory Visit: Payer: Self-pay | Admitting: Family Medicine

## 2018-03-22 NOTE — Telephone Encounter (Signed)
Ok to refill??  Last office visit 08/17/2017.  Last refill 12/14/2017, #2 refills.

## 2018-03-25 ENCOUNTER — Other Ambulatory Visit: Payer: Self-pay | Admitting: Family Medicine

## 2018-03-25 DIAGNOSIS — R000 Tachycardia, unspecified: Secondary | ICD-10-CM

## 2018-03-30 ENCOUNTER — Ambulatory Visit: Payer: Managed Care, Other (non HMO) | Admitting: Internal Medicine

## 2018-03-30 ENCOUNTER — Encounter: Payer: Self-pay | Admitting: Internal Medicine

## 2018-03-30 VITALS — BP 141/92 | HR 86 | Temp 96.9°F | Ht 68.0 in | Wt 203.4 lb

## 2018-03-30 DIAGNOSIS — K648 Other hemorrhoids: Secondary | ICD-10-CM

## 2018-03-30 NOTE — Patient Instructions (Signed)
Avoid straining.  Benefiber 1 tablespoon  twice daily  Limit toilet time to 5 minutes  Call with any interim problems  Schedule followup appointment in 3 months from now

## 2018-03-31 NOTE — Progress Notes (Signed)
[place] banding procedure note:  The patient presents with symptomatic hemorrhoids;  Significant improvement after having the left lateral hemorrhoid banded;  patient requesting rubber band ligation of his hemorrhoidal disease. All risks, benefits, and alternative forms of therapy were described and informed consent was obtained.  In the left lateral decubitus position, a DRE performed revealed no abnormalities.  The decision was made to band the right anterior and posterior hemorrhoidal columns. One band placed on each column. Follow-up DRE revealed excellent positioning. No pinching or pain. The patient was discharged home without pain or other issues. Dietary and behavioral recommendations were given.  No complications were encountered and the patient tolerated the procedure well.

## 2018-04-12 ENCOUNTER — Encounter: Payer: Self-pay | Admitting: Internal Medicine

## 2018-04-27 ENCOUNTER — Other Ambulatory Visit: Payer: Self-pay | Admitting: Family Medicine

## 2018-04-28 ENCOUNTER — Other Ambulatory Visit: Payer: Self-pay | Admitting: Family Medicine

## 2018-04-29 NOTE — Telephone Encounter (Signed)
Ok to refill 

## 2018-05-30 ENCOUNTER — Other Ambulatory Visit: Payer: Self-pay | Admitting: Family Medicine

## 2018-06-03 ENCOUNTER — Ambulatory Visit (INDEPENDENT_AMBULATORY_CARE_PROVIDER_SITE_OTHER): Payer: Managed Care, Other (non HMO) | Admitting: Family Medicine

## 2018-06-03 ENCOUNTER — Encounter: Payer: Self-pay | Admitting: Family Medicine

## 2018-06-03 VITALS — BP 120/80 | HR 80 | Temp 98.0°F | Resp 16 | Ht 68.0 in | Wt 198.0 lb

## 2018-06-03 DIAGNOSIS — R5383 Other fatigue: Secondary | ICD-10-CM | POA: Diagnosis not present

## 2018-06-03 DIAGNOSIS — Z125 Encounter for screening for malignant neoplasm of prostate: Secondary | ICD-10-CM | POA: Diagnosis not present

## 2018-06-03 DIAGNOSIS — I10 Essential (primary) hypertension: Secondary | ICD-10-CM | POA: Diagnosis not present

## 2018-06-03 DIAGNOSIS — R413 Other amnesia: Secondary | ICD-10-CM

## 2018-06-03 DIAGNOSIS — R42 Dizziness and giddiness: Secondary | ICD-10-CM

## 2018-06-03 DIAGNOSIS — D509 Iron deficiency anemia, unspecified: Secondary | ICD-10-CM | POA: Diagnosis not present

## 2018-06-03 NOTE — Progress Notes (Signed)
Subjective:    Patient ID: [name], male    DOB: [birth year], 52 y.o.   MRN: [id number]  Hypertension     08/22/16 Please review my recent office visits. Patient was ultimately started on amlodipine in addition to his Hyzaar for hypertension. At his last office visit, he was also started on a Z-Pak. His wife called back later and stated that he was worse. His blood pressure was also elevated. At that point I added Cardura and switch his antibiotic to Levaquin. Shortly thereafter, the patient developed severe hypotension and orthostatic dizziness. He was taken to the hospital where he was found to have very low blood pressure and was diagnosed with pneumonia. His pneumonia was treated with Levaquin. He was discharged home on Hyzaar and metoprolol. However his wife believes that he is also been taking Cardura in addition by accident. Since discharge from the hospital, he reports orthostatic dizziness, weakness, cramps. His heart rate is elevated and he is still appears dehydrated. He denies any shortness of breath. His cough has improved. He denies any pleurisy. Unfortunately he continues to smoke.  At that time, my plan was: I believe the patient is overmedicated by accident. I told the patient that I only want him taking Hyzaar and metoprolol. He is to discontinue Cardura. If he has not been taking Cardura, also want him to discontinue metoprolol. I told him to push fluids over the weekend. He is to be out of work until Tuesday when I reexamine him. Hopefully at that time his hypotension and dehydration will have resolved. I will check a CBC today along with a CMP. Strongly recommended smoking cessation. Recheck a chest x-ray in 4 weeks  08/26/16 The patient's breathing is back to his baseline. However he still feels extremely weak. He still reports orthostatic dizziness upon standing however his blood pressure is actually high at home and is normal here today. His lab work was unremarkable  that was obtained at his last visit. I believe some of the weakness and dizziness is due to his recovery from his recent pneumonia. I believe the other orthostatic dizziness could be due to his beta blocker.  At that time, my plan was: Continue Hyzaar. At this point I believe the patient should be cleared to return to work on Monday, October 9. This should help provide him adequate time to recover from his pneumonia. I believe the orthostatic dizziness is due to his beta blocker. I will have him discontinue metoprolol and resume amlodipine 10 mg a day. Recheck chest x-ray at the end of this month to ensure resolution of his pneumonia  09/02/16 The patient is worked in urgently today due to a rapid heartbeat. He called my nurse this afternoon and stated that his heart rate was as high as 160 bpm. He states that his heart rate has been racing all weekend. He continues to feel extremely lightheaded upon standing, extremely weak, and very fatigued even though he is now over a week out from his pneumonia. On examination today he is in sinus tachycardia with an elevated blood pressure despite taking amlodipine as well as Hyzaar. He is no longer on any statin medication. He is not taking any aspirin due to his recent history of a GI bleed. EKG is performed today in the office and compared to an EKG from May this year. There is now nonspecific ST depression in the inferior and lateral leads with T-wave inversions in lead 1, aVL, V5, and V6. This is a  significant change when compared to May of this year. Patient continues to smoke.  At that time, my plan was: Given his EKG changes, his shortness of breath, and his profound fatigue, I believe the patient has coronary artery disease and likely has significant blockage in either of the left main or one of its branches. He needs urgent cardiology evaluation. At this time he is stable and is having no angina and therefore does not require hospitalization. I want him to start  aspirin 81 mg a day evening given his history of GI bleed. I want him to continue amlodipine and Hyzaar. Meanwhile I will start him on carvedilol 12.5 mg by mouth twice a day for his tachycardia, presumed ASCVD, and elevated blood pressure. I want him to start Lipitor 40 mg a day. I'll try to arrange cardiology consultation ASAP. He is not to return to work. Should he develop chest pain he is to go straight to the hospital.  09/12/16 Fully, the patient's stress test performed by his cardiologist came back normal with no evidence of ischemia. Unfortunately he is only able to perform 5 METs showing poor cardiovascular fitness. He continues to remain slightly tachycardic although his blood pressure is much better today at 110/76. He still reports some dizziness and disequilibrium with position changes but his shortness of breath has improved and his fatigue has improved. He is scheduled to have an echocardiogram of his heart on Tuesday. He is due to recheck his chest x-ray to ensure resolution of his pneumonia and to rule out hidden malignancy.  At that time, my plan was: His blood pressure is better. He has quit smoking!. He continues to remain slightly tachycardic but I believe this is a reflection of poor cardiovascular fitness as evidenced by his poor performance on his stress test. I encouraged gradually increasing aerobic exercise. I will still await the results of his echocardiogram. I would like the patient to go Monday and get his chest x-ray to ensure complete resolution of his pneumonia. He will get his echocardiogram on Tuesday. Absent any abnormality, the patient can return to work on Wednesday.  09/19/16 Patient is here for follow-up. His blood pressure is still doing well. He states that it is higher in the morning but by mid afternoon it is much lower. However he is only taking his carvedilol once a day. He is not taking it twice a day. Furthermore he is not exactly sure what medication he is  taking. He wants to proceed with his back surgery. He recently passed a stress test. His repeat chest x-ray showed no residual pneumonia and no hidden malignancy. Therefore from a medical standpoint he is cleared to proceed with surgery. He does still complain of orthostatic dizziness. He denies any headache, blurry vision, double vision, vertigo. He does have some hearing loss. He also has some mild tinnitus.  At that time, my plan was: Pneumonia has completely resolved. Blood pressure is stable. Take carvedilol twice a day as directed. He is medically cleared to proceed with surgery on his lower back area and I recommended increasing aerobic exercise to 30 minutes a day 5 days a week. The dizziness persists or he develops headaches blurry vision or double vision, I will proceed with an MRI of the brain. However at the present time I suspect some of this is due to deconditioning versus possible Mnire's disease  06/03/18 Patient states that he is continuing to have dizzy spells.  Some of the dizzy spells are consistent with orthostatic  hypotension.  For instance he will stand up from a seated position and feel lightheaded like he may pass out.  The symptoms last only a few seconds.  However he is also having dizzy spells, for instance, at work.  He will look up towards the ceiling simply raising his head, and he will feel extremely lightheaded like he may pass out.  He denies any vertigo, tinnitus, or hearing loss.  However he is starting to experience memory loss.  For instance he is forgetting things at work such as doing certain jobs and certain task that is been assigned to him.  He is also forgetting doing certain chores around his home.  He denies any headache or blurry vision or diplopia.  He denies any staggering, or balance issues.  Neurologic exam today is normal.  He denies any significant memory impairment.  It is last visit, he was found to have iron deficiency anemia.  He has had a colonoscopy  which found 3 polyps but was otherwise normal.  He is on iron supplementation.  He is due to recheck a CBC.  He also continues to complain of severe fatigue as well as erectile dysfunction and poor libido.  It is visit of October last year he was found to have testosterone deficiency.  He is now interested in treating testosterone deficiency.  His blood pressure is well controlled at 120/80 he is due to recheck his cholesterol.  He denies any chest pain shortness of breath dyspnea on exertion myalgias or right upper quadrant pain Past Medical History:  Diagnosis Date  . Allergy   . Anemia   . Colonic diverticular abscess 12/26/2014  . Diverticulitis   . ETOH abuse   . GAD (generalized anxiety disorder)   . GERD (gastroesophageal reflux disease)   . Hypercholesteremia   . Hypertension    Past Surgical History:  Procedure Laterality Date  . APPENDECTOMY N/A 01/01/2015   Procedure: INCIDENTAL APPENDECTOMY;  Surgeon: Jamesetta So, MD;  Location: [facility];  Service: General;  Laterality: N/A;  . BACK SURGERY     lower back, fusion  . COLONOSCOPY WITH PROPOFOL N/A 12/21/2017   Procedure: COLONOSCOPY WITH PROPOFOL;  Surgeon: Daneil Dolin, MD;  Location: [facility];  Service: Endoscopy;  Laterality: N/A;  9:45am  . GASTRECTOMY N/A 04/14/2016   Procedure: Toniann Ket;  Surgeon: Aviva Signs, MD;  Location: [facility];  Service: General;  Laterality: N/A;  . HEMORRHOID SURGERY    . HERNIA REPAIR     as child  . PARTIAL COLECTOMY N/A 01/01/2015   Procedure: PARTIAL COLECTOMY;  Surgeon: Jamesetta So, MD;  Location: [facility];  Service: General;  Laterality: N/A;  . POLYPECTOMY  12/21/2017   Procedure: POLYPECTOMY;  Surgeon: Daneil Dolin, MD;  Location: [facility];  Service: Endoscopy;;  colon  . ROTATOR CUFF REPAIR Bilateral    Current Outpatient Medications on File Prior to Visit  Medication Sig Dispense Refill  . acetaminophen (TYLENOL) 500 MG tablet Take 500 mg by mouth every 6 (six) hours  as needed for moderate pain or headache.    Marland Kitchen amLODipine (NORVASC) 10 MG tablet Take 1 tablet (10 mg total) by mouth daily. This replaces metoprolol 90 tablet 3  . atorvastatin (LIPITOR) 40 MG tablet TAKE 1 TABLET BY MOUTH EVERY DAY 90 tablet 1  . carvedilol (COREG) 12.5 MG tablet Take 1 tablet (12.5 mg total) by mouth 2 (two) times daily with a meal. 180 tablet 3  . clonazePAM (KLONOPIN)  0.5 MG tablet TAKE 1 TABLET BY MOUTH TWICE A DAY AS NEEDED FOR ANXIETY 30 tablet 2  . cyclobenzaprine (FLEXERIL) 10 MG tablet TAKE 10 MG BY MOUTH EVERY DAY AT BEDTIME AS NEEDED FOR MUSCLE SPASMS 30 tablet 2  . ferrous sulfate 325 (65 FE) MG EC tablet Take 325 mg by mouth daily with breakfast.    . fluticasone (FLONASE) 50 MCG/ACT nasal spray Place 1 spray into both nostrils daily as needed for allergies.   1  . Homeopathic Products (ZICAM COLD REMEDY NA) Place 1 spray into both nostrils daily as needed (for allergies).    . hydrochlorothiazide (MICROZIDE) 12.5 MG capsule TAKE ONE CAPSULE BY MOUTH EVERY DAY 90 capsule 1  . losartan (COZAAR) 50 MG tablet TAKE 1 TABLET BY MOUTH EVERY DAY 90 tablet 1  . losartan-hydrochlorothiazide (HYZAAR) 50-12.5 MG tablet TAKE 1 TABLET BY MOUTH DAILY. 90 tablet 3  . pantoprazole (PROTONIX) 40 MG tablet Take 1 tablet (40 mg total) by mouth 2 (two) times daily. 180 tablet 3  . venlafaxine XR (EFFEXOR-XR) 150 MG 24 hr capsule TAKE 1 CAPSULE BY MOUTH IN THE MORNING 90 capsule 3   No current facility-administered medications on file prior to visit.    No Known Allergies Social History   Socioeconomic History  . Marital status: Married    Spouse name: Not on file  . Number of children: Not on file  . Years of education: Not on file  . Highest education level: Not on file  Occupational History  . Not on file  Social Needs  . Financial resource strain: Not on file  . Food insecurity:    Worry: Not on file    Inability: Not on file  . Transportation needs:    Medical: Not on  file    Non-medical: Not on file  Tobacco Use  . Smoking status: Current Every Day Smoker    Packs/day: 1.00    Years: 35.00    Pack years: 35.00    Types: Cigarettes  . Smokeless tobacco: Never Used  Substance and Sexual Activity  . Alcohol use: Yes    Alcohol/week: 0.0 oz    Comment: patient states "about 5 or 6 a day" (beers)  . Drug use: No  . Sexual activity: Yes  Lifestyle  . Physical activity:    Days per week: Not on file    Minutes per session: Not on file  . Stress: Not on file  Relationships  . Social connections:    Talks on phone: Not on file    Gets together: Not on file    Attends religious service: Not on file    Active member of club or organization: Not on file    Attends meetings of clubs or organizations: Not on file    Relationship status: Not on file  . Intimate partner violence:    Fear of current or ex partner: Not on file    Emotionally abused: Not on file    Physically abused: Not on file    Forced sexual activity: Not on file  Other Topics Concern  . Not on file  Social History Narrative  . Not on file      Review of Systems  All other systems reviewed and are negative.      Objective:   Physical Exam  Constitutional: He is oriented to person, place, and time. He appears well-developed and well-nourished.  Eyes: Pupils are equal, round, and reactive to light. Conjunctivae and EOM are  normal.  Neck: Neck supple.  Cardiovascular: Normal rate, regular rhythm and normal heart sounds.  No murmur heard. Pulmonary/Chest: Effort normal and breath sounds normal. No respiratory distress. He has no wheezes. He has no rales.  Abdominal: Soft. Bowel sounds are normal. He exhibits no distension. There is no tenderness. There is no rebound and no guarding.  Musculoskeletal: He exhibits no edema.  Lymphadenopathy:    He has no cervical adenopathy.  Neurological: He is alert and oriented to person, place, and time. He has normal reflexes. He  displays normal reflexes. No cranial nerve deficit. He exhibits normal muscle tone. Coordination normal.  Vitals reviewed.         Assessment & Plan:   Benign essential HTN - Plan: CBC with Differential/Platelet, COMPLETE METABOLIC PANEL WITH GFR, Lipid panel  Iron deficiency anemia, unspecified iron deficiency anemia type - Plan: CBC with Differential/Platelet, COMPLETE METABOLIC PANEL WITH GFR  Prostate cancer screening - Plan: PSA  Other fatigue - Plan: Testosterone Total,Free,Bio, Males  Dizziness, nonspecific - Plan: MR Brain W Wo Contrast  Memory loss - Plan: MR Brain W Wo Contrast  Patient's blood pressures well controlled today.  I will check a CMP and a fasting lipid panel to monitor his cholesterol.  Goal LDL cholesterol is less than 100 on atorvastatin.  While checking lab work I will screen for prostate cancer with a PSA and I will recheck his iron deficiency anemia by checking a CBC.  I will also recheck his testosterone level and if confirmed to be persistently low, I will start the patient on testosterone cypionate 200 mg IM every 2 weeks and recheck levels in 3 months.  We discussed increased cardiovascular risk and prostate cancer risk on testosterone replacement and the patient is willing to proceed.  Regarding his dizziness, some of his symptoms sound like orthostatic dizziness likely that is physiologic exacerbated by carvedilol.  However some of the dizziness cannot be explained and makes me worry about basilar insufficiency particularly with dizziness upon looking up.  I am also concerned about his memory loss.  Therefore I will proceed with an MRI of the brain to evaluate further.

## 2018-06-04 LAB — CBC WITH DIFFERENTIAL/PLATELET
BASOS ABS: 89 {cells}/uL (ref 0–200)
Basophils Relative: 0.8 %
EOS ABS: 233 {cells}/uL (ref 15–500)
Eosinophils Relative: 2.1 %
HCT: 48.5 % (ref 38.5–50.0)
HEMOGLOBIN: 16.6 g/dL (ref 13.2–17.1)
Lymphs Abs: 2165 cells/uL (ref 850–3900)
MCH: 32.7 pg (ref 27.0–33.0)
MCHC: 34.2 g/dL (ref 32.0–36.0)
MCV: 95.5 fL (ref 80.0–100.0)
MPV: 9.6 fL (ref 7.5–12.5)
Monocytes Relative: 11.8 %
NEUTROS ABS: 7304 {cells}/uL (ref 1500–7800)
Neutrophils Relative %: 65.8 %
Platelets: 313 10*3/uL (ref 140–400)
RBC: 5.08 10*6/uL (ref 4.20–5.80)
RDW: 13.1 % (ref 11.0–15.0)
TOTAL LYMPHOCYTE: 19.5 %
WBC: 11.1 10*3/uL — ABNORMAL HIGH (ref 3.8–10.8)
WBCMIX: 1310 {cells}/uL — AB (ref 200–950)

## 2018-06-04 LAB — COMPLETE METABOLIC PANEL WITH GFR
AG RATIO: 1.5 (calc) (ref 1.0–2.5)
ALBUMIN MSPROF: 4.1 g/dL (ref 3.6–5.1)
ALKALINE PHOSPHATASE (APISO): 109 U/L (ref 40–115)
ALT: 38 U/L (ref 9–46)
AST: 31 U/L (ref 10–35)
BILIRUBIN TOTAL: 0.4 mg/dL (ref 0.2–1.2)
BUN: 8 mg/dL (ref 7–25)
CHLORIDE: 100 mmol/L (ref 98–110)
CO2: 28 mmol/L (ref 20–32)
Calcium: 9.5 mg/dL (ref 8.6–10.3)
Creat: 0.74 mg/dL (ref 0.70–1.33)
GFR, EST AFRICAN AMERICAN: 122 mL/min/{1.73_m2} (ref >=60)
GFR, Est Non African American: 105 mL/min/{1.73_m2} (ref >=60)
Globulin: 2.7 g/dL (calc) (ref 1.9–3.7)
Glucose, Bld: 114 mg/dL — ABNORMAL HIGH (ref 65–99)
POTASSIUM: 3.9 mmol/L (ref 3.5–5.3)
SODIUM: 137 mmol/L (ref 135–146)
Total Protein: 6.8 g/dL (ref 6.1–8.1)

## 2018-06-04 LAB — TESTOSTERONE TOTAL,FREE,BIO, MALES
Albumin: 4.1 g/dL (ref 3.6–5.1)
Sex Hormone Binding: 38 nmol/L (ref 10–50)
TESTOSTERONE: 373 ng/dL (ref 250–827)
Testosterone, Bioavailable: 82.8 ng/dL — ABNORMAL LOW (ref >=110.0)
Testosterone, Free: 44 pg/mL — ABNORMAL LOW (ref 46.0–224.0)

## 2018-06-04 LAB — PSA: PSA: 2.6 ng/mL (ref <=4.0)

## 2018-06-04 LAB — LIPID PANEL
CHOLESTEROL: 120 mg/dL (ref <200)
HDL: 64 mg/dL (ref >40)
LDL CHOLESTEROL (CALC): 41 mg/dL
NON-HDL CHOLESTEROL (CALC): 56 mg/dL (ref <130)
Total CHOL/HDL Ratio: 1.9 (calc) (ref <5.0)
Triglycerides: 73 mg/dL (ref <150)

## 2018-06-08 ENCOUNTER — Encounter: Payer: Self-pay | Admitting: Family Medicine

## 2018-06-11 ENCOUNTER — Other Ambulatory Visit: Payer: Self-pay | Admitting: Family Medicine

## 2018-06-11 DIAGNOSIS — R42 Dizziness and giddiness: Secondary | ICD-10-CM

## 2018-06-11 NOTE — Progress Notes (Signed)
Referral placed per Dr. Samella Parr orders. Please see denied MRI

## 2018-06-15 ENCOUNTER — Ambulatory Visit (INDEPENDENT_AMBULATORY_CARE_PROVIDER_SITE_OTHER): Payer: Managed Care, Other (non HMO) | Admitting: Family Medicine

## 2018-06-15 ENCOUNTER — Encounter: Payer: Self-pay | Admitting: Family Medicine

## 2018-06-15 VITALS — BP 112/74 | HR 80 | Temp 98.2°F | Resp 16 | Ht 68.0 in | Wt 198.0 lb

## 2018-06-15 DIAGNOSIS — J069 Acute upper respiratory infection, unspecified: Secondary | ICD-10-CM | POA: Diagnosis not present

## 2018-06-15 DIAGNOSIS — E291 Testicular hypofunction: Secondary | ICD-10-CM

## 2018-06-15 MED ORDER — LEVOCETIRIZINE DIHYDROCHLORIDE 5 MG PO TABS: 5.0000 mg | ORAL_TABLET | Freq: Every evening | ORAL | 0 refills | Status: DC

## 2018-06-15 MED ORDER — HYDROCODONE-HOMATROPINE 5-1.5 MG/5ML PO SYRP: 5.0000 mL | ORAL_SOLUTION | Freq: Three times a day (TID) | ORAL | 0 refills | Status: DC | PRN

## 2018-06-15 MED ORDER — AZITHROMYCIN 250 MG PO TABS: ORAL_TABLET | ORAL | 0 refills | Status: DC

## 2018-06-15 NOTE — Progress Notes (Signed)
Subjective:    Patient ID: [name], male    DOB: [birth year], 52 y.o.   MRN: [id number] Patient reports a 4-day history of rhinorrhea, head congestion, cough, chest congestion, dull headache.  Cough is productive of yellowish-green sputum.  He denies any chest pain.  He does report some mild shortness of breath.  He denies any pleurisy or hemoptysis.  He denies any fevers chills or rigors.  I recently saw the patient he discussed his fatigue, low libido, and erectile dysfunction.  On his most recent lab work, his free testosterone and bioavailable testosterone was found to be low.  He is here today to discuss this as well  Past Medical History:  Diagnosis Date  . Allergy   . Anemia   . Colonic diverticular abscess 12/26/2014  . Diverticulitis   . ETOH abuse   . GAD (generalized anxiety disorder)   . GERD (gastroesophageal reflux disease)   . Hypercholesteremia   . Hypertension    Past Surgical History:  Procedure Laterality Date  . APPENDECTOMY N/A 01/01/2015   Procedure: INCIDENTAL APPENDECTOMY;  Surgeon: Jamesetta So, MD;  Location: [facility];  Service: General;  Laterality: N/A;  . BACK SURGERY     lower back, fusion  . COLONOSCOPY WITH PROPOFOL N/A 12/21/2017   Procedure: COLONOSCOPY WITH PROPOFOL;  Surgeon: Daneil Dolin, MD;  Location: [facility];  Service: Endoscopy;  Laterality: N/A;  9:45am  . GASTRECTOMY N/A 04/14/2016   Procedure: Toniann Ket;  Surgeon: Aviva Signs, MD;  Location: [facility];  Service: General;  Laterality: N/A;  . HEMORRHOID SURGERY    . HERNIA REPAIR     as child  . PARTIAL COLECTOMY N/A 01/01/2015   Procedure: PARTIAL COLECTOMY;  Surgeon: Jamesetta So, MD;  Location: [facility];  Service: General;  Laterality: N/A;  . POLYPECTOMY  12/21/2017   Procedure: POLYPECTOMY;  Surgeon: Daneil Dolin, MD;  Location: [facility];  Service: Endoscopy;;  colon  . ROTATOR CUFF REPAIR Bilateral    Current Outpatient Medications on File Prior to Visit    Medication Sig Dispense Refill  . acetaminophen (TYLENOL) 500 MG tablet Take 500 mg by mouth every 6 (six) hours as needed for moderate pain or headache.    Marland Kitchen amLODipine (NORVASC) 10 MG tablet Take 1 tablet (10 mg total) by mouth daily. This replaces metoprolol 90 tablet 3  . atorvastatin (LIPITOR) 40 MG tablet TAKE 1 TABLET BY MOUTH EVERY DAY 90 tablet 1  . carvedilol (COREG) 12.5 MG tablet Take 1 tablet (12.5 mg total) by mouth 2 (two) times daily with a meal. 180 tablet 3  . clonazePAM (KLONOPIN) 0.5 MG tablet TAKE 1 TABLET BY MOUTH TWICE A DAY AS NEEDED FOR ANXIETY 30 tablet 2  . cyclobenzaprine (FLEXERIL) 10 MG tablet TAKE 10 MG BY MOUTH EVERY DAY AT BEDTIME AS NEEDED FOR MUSCLE SPASMS 30 tablet 2  . ferrous sulfate 325 (65 FE) MG EC tablet Take 325 mg by mouth daily with breakfast.    . fluticasone (FLONASE) 50 MCG/ACT nasal spray Place 1 spray into both nostrils daily as needed for allergies.   1  . Homeopathic Products (ZICAM COLD REMEDY NA) Place 1 spray into both nostrils daily as needed (for allergies).    . hydrochlorothiazide (MICROZIDE) 12.5 MG capsule TAKE ONE CAPSULE BY MOUTH EVERY DAY 90 capsule 1  . losartan (COZAAR) 50 MG tablet TAKE 1 TABLET BY MOUTH EVERY DAY 90 tablet 1  . pantoprazole (PROTONIX) 40 MG  tablet Take 1 tablet (40 mg total) by mouth 2 (two) times daily. 180 tablet 3  . venlafaxine XR (EFFEXOR-XR) 150 MG 24 hr capsule TAKE 1 CAPSULE BY MOUTH IN THE MORNING 90 capsule 3   No current facility-administered medications on file prior to visit.    No Known Allergies Social History   Socioeconomic History  . Marital status: Married    Spouse name: Not on file  . Number of children: Not on file  . Years of education: Not on file  . Highest education level: Not on file  Occupational History  . Not on file  Social Needs  . Financial resource strain: Not on file  . Food insecurity:    Worry: Not on file    Inability: Not on file  . Transportation needs:     Medical: Not on file    Non-medical: Not on file  Tobacco Use  . Smoking status: Current Every Day Smoker    Packs/day: 1.00    Years: 35.00    Pack years: 35.00    Types: Cigarettes  . Smokeless tobacco: Never Used  Substance and Sexual Activity  . Alcohol use: Yes    Alcohol/week: 0.0 oz    Comment: patient states "about 5 or 6 a day" (beers)  . Drug use: No  . Sexual activity: Yes  Lifestyle  . Physical activity:    Days per week: Not on file    Minutes per session: Not on file  . Stress: Not on file  Relationships  . Social connections:    Talks on phone: Not on file    Gets together: Not on file    Attends religious service: Not on file    Active member of club or organization: Not on file    Attends meetings of clubs or organizations: Not on file    Relationship status: Not on file  . Intimate partner violence:    Fear of current or ex partner: Not on file    Emotionally abused: Not on file    Physically abused: Not on file    Forced sexual activity: Not on file  Other Topics Concern  . Not on file  Social History Narrative  . Not on file      Review of Systems  All other systems reviewed and are negative.      Objective:   Physical Exam  Constitutional: He appears well-developed and well-nourished.  Neck: Neck supple.  Cardiovascular: Normal rate, regular rhythm and normal heart sounds.  No murmur heard. Pulmonary/Chest: Effort normal and breath sounds normal. No respiratory distress. He has no wheezes. He has no rales.  Abdominal: Soft. Bowel sounds are normal. He exhibits no distension. There is no tenderness. There is no rebound and no guarding.  Musculoskeletal: He exhibits no edema.  Lymphadenopathy:    He has no cervical adenopathy.  Vitals reviewed.         Assessment & Plan:  Viral upper respiratory tract infection  Hypogonadism in male Patient appears to have a simple viral upper respiratory infection.  I recommended Hycodan 1  teaspoon every 6 hours as needed for cough.  He can take Xyzal 5 mg p.o. daily for head congestion and rhinorrhea.  Avoid Sudafed due to his history of high blood pressure.  I did give the patient a prescription for a Z-Pak but I gave him strict instructions not to fill unless he develops a fever greater than 101, purulent sputum, worsening shortness of breath, or chest  discomfort.  I explained that a Z-Pak will not treat a virus.  I would recommend treating his hypogonadism with testosterone cypionate 200 mg IM every 2 weeks and then recheck PSA, CBC, testosterone level in 3 months.  We discussed the risk of prostate cancer cardiovascular mortality, and polycythemia.  Patient is willing to accept the risk.  However we declined starting treatment today due to his illness.

## 2018-07-05 ENCOUNTER — Other Ambulatory Visit: Payer: Self-pay | Admitting: Family Medicine

## 2018-07-06 ENCOUNTER — Ambulatory Visit: Payer: Managed Care, Other (non HMO) | Admitting: Internal Medicine

## 2018-07-06 NOTE — Telephone Encounter (Signed)
Requesting refill    Klonopin  LOV: 06/15/18  LRF:  03/23/18

## 2018-07-07 ENCOUNTER — Other Ambulatory Visit: Payer: Self-pay | Admitting: Family Medicine

## 2018-07-09 ENCOUNTER — Other Ambulatory Visit: Payer: Self-pay | Admitting: Family Medicine

## 2018-07-09 NOTE — Telephone Encounter (Signed)
Can you resend the Klonopin from 07/06/18 - Pharmacy did not get.

## 2018-07-09 NOTE — Telephone Encounter (Signed)
Patient states [facility] [address] state they don't have a refill on his klonopin that was sent in on 07/06/18.  CB# [phone] Patient states you can leave a message.

## 2018-07-12 MED ORDER — CLONAZEPAM 0.5 MG PO TABS: ORAL_TABLET | ORAL | 2 refills | Status: DC

## 2018-07-20 ENCOUNTER — Encounter: Payer: Self-pay | Admitting: Internal Medicine

## 2018-07-20 ENCOUNTER — Ambulatory Visit (INDEPENDENT_AMBULATORY_CARE_PROVIDER_SITE_OTHER): Payer: Managed Care, Other (non HMO) | Admitting: Internal Medicine

## 2018-07-20 VITALS — BP 139/88 | HR 73 | Temp 98.3°F | Ht 68.0 in | Wt 197.8 lb

## 2018-07-20 DIAGNOSIS — K219 Gastro-esophageal reflux disease without esophagitis: Secondary | ICD-10-CM | POA: Diagnosis not present

## 2018-07-20 DIAGNOSIS — K648 Other hemorrhoids: Secondary | ICD-10-CM

## 2018-07-20 NOTE — Progress Notes (Signed)
Primary Care Physician:  Susy Frizzle, MD Primary Gastroenterologist:  Dr. Gala Romney  Pre-Procedure History & Physical: HPI:  [name] is a 52 y.o. male here for follow up hemorrhoids. Status post  banding. Patient states his hemorrhoid issues have resolved. Reflux symptoms well controlled on Protonix 40 mg twice daily. He's no longer taking aspirin/NSAID agents. History of colonic adenoma; due for surveillance examination 2024. He continues to drink multiple beers daily - upwards of 12 .  History of cough and diverticulitis requiring surgical resection. History partial gastrectomy for peptic ulcer disease with perforation.  Past Medical History:  Diagnosis Date  . Allergy   . Anemia   . Colonic diverticular abscess 12/26/2014  . Diverticulitis   . ETOH abuse   . GAD (generalized anxiety disorder)   . GERD (gastroesophageal reflux disease)   . Hypercholesteremia   . Hypertension     Past Surgical History:  Procedure Laterality Date  . APPENDECTOMY N/A 01/01/2015   Procedure: INCIDENTAL APPENDECTOMY;  Surgeon: Jamesetta So, MD;  Location: [facility];  Service: General;  Laterality: N/A;  . BACK SURGERY     lower back, fusion  . COLONOSCOPY WITH PROPOFOL N/A 12/21/2017   Procedure: COLONOSCOPY WITH PROPOFOL;  Surgeon: Daneil Dolin, MD;  Location: [facility];  Service: Endoscopy;  Laterality: N/A;  9:45am  . GASTRECTOMY N/A 04/14/2016   Procedure: Toniann Ket;  Surgeon: Aviva Signs, MD;  Location: [facility];  Service: General;  Laterality: N/A;  . HEMORRHOID SURGERY    . HERNIA REPAIR     as child  . PARTIAL COLECTOMY N/A 01/01/2015   Procedure: PARTIAL COLECTOMY;  Surgeon: Jamesetta So, MD;  Location: [facility];  Service: General;  Laterality: N/A;  . POLYPECTOMY  12/21/2017   Procedure: POLYPECTOMY;  Surgeon: Daneil Dolin, MD;  [facility];  Service: Endoscopy;;  colon  . ROTATOR CUFF REPAIR Bilateral     Prior to Admission medications   Medication Sig  Start Date End Date Taking? Authorizing Provider  acetaminophen (TYLENOL) 500 MG tablet Take 500 mg by mouth every 6 (six) hours as needed for moderate pain or headache.   Yes [provider]  amLODipine (NORVASC) 10 MG tablet Take 1 tablet (10 mg total) by mouth daily. This replaces metoprolol 08/17/17  Yes [place], Modena Nunnery, MD  atorvastatin (LIPITOR) 40 MG tablet TAKE 1 TABLET BY MOUTH EVERY DAY 03/25/18  Yes Susy Frizzle, MD  carvedilol (COREG) 12.5 MG tablet Take 1 tablet (12.5 mg total) by mouth 2 (two) times daily with a meal. 08/17/17  Yes [place], Modena Nunnery, MD  clonazePAM (KLONOPIN) 0.5 MG tablet TAKE 1 TABLET BY MOUTH TWICE A DAY AS NEEDED FOR ANXIETY 07/12/18  Yes Susy Frizzle, MD  cyclobenzaprine (FLEXERIL) 10 MG tablet TAKE 10 MG BY MOUTH EVERY DAY AT BEDTIME AS NEEDED FOR MUSCLE SPASMS 04/29/18  Yes Susy Frizzle, MD  ferrous sulfate 325 (65 FE) MG EC tablet Take 325 mg by mouth daily with breakfast.   Yes [provider]  fluticasone (FLONASE) 50 MCG/ACT nasal spray Place 1 spray into both nostrils daily as needed for allergies.  09/15/17  Yes [provider]  Homeopathic Products (ZICAM COLD REMEDY NA) Place 1 spray into both nostrils daily as needed (for allergies).   Yes [provider]  hydrochlorothiazide (MICROZIDE) 12.5 MG capsule TAKE ONE CAPSULE BY MOUTH EVERY DAY 05/31/18  Yes Susy Frizzle, MD  losartan (COZAAR) 50 MG  tablet TAKE 1 TABLET BY MOUTH EVERY DAY 05/31/18  Yes Susy Frizzle, MD  pantoprazole (PROTONIX) 40 MG tablet Take 1 tablet (40 mg total) by mouth 2 (two) times daily. 08/17/17  Yes [place], Modena Nunnery, MD  venlafaxine XR (EFFEXOR-XR) 150 MG 24 hr capsule TAKE 1 CAPSULE BY MOUTH IN THE MORNING 04/27/18  Yes Susy Frizzle, MD  azithromycin (ZITHROMAX) 250 MG tablet 2 tabs poqday1, 1 tab poqday 2-5 Patient not taking: Reported on 07/20/2018 06/15/18   Susy Frizzle, MD  HYDROcodone-homatropine [facility]) 5-1.5 MG/5ML  syrup Take 5 mLs by mouth every 8 (eight) hours as needed for cough. Patient not taking: Reported on 07/20/2018 06/15/18   Susy Frizzle, MD  levocetirizine (XYZAL) 5 MG tablet TAKE 1 TABLET BY MOUTH EVERY DAY IN THE EVENING Patient not taking: Reported on 07/20/2018 07/07/18   Susy Frizzle, MD    Allergies as of 07/20/2018  . (No Known Allergies)    Family History  Problem Relation Age of Onset  . Stroke Mother   . CAD Father   . Colon cancer Neg Hx     Social History   Socioeconomic History  . Marital status: Married    Spouse name: Not on file  . Number of children: Not on file  . Years of education: Not on file  . Highest education level: Not on file  Occupational History  . Not on file  Social Needs  . Financial resource strain: Not on file  . Food insecurity:    Worry: Not on file    Inability: Not on file  . Transportation needs:    Medical: Not on file    Non-medical: Not on file  Tobacco Use  . Smoking status: Current Every Day Smoker    Packs/day: 1.00    Years: 35.00    Pack years: 35.00    Types: Cigarettes  . Smokeless tobacco: Never Used  Substance and Sexual Activity  . Alcohol use: Yes    Alcohol/week: 0.0 standard drinks    Comment: patient states "about 5 or 6 a day" (beers)  . Drug use: No  . Sexual activity: Yes  Lifestyle  . Physical activity:    Days per week: Not on file    Minutes per session: Not on file  . Stress: Not on file  Relationships  . Social connections:    Talks on phone: Not on file    Gets together: Not on file    Attends religious service: Not on file    Active member of club or organization: Not on file    Attends meetings of clubs or organizations: Not on file    Relationship status: Not on file  . Intimate partner violence:    Fear of current or ex partner: Not on file    Emotionally abused: Not on file    Physically abused: Not on file    Forced sexual activity: Not on file  Other Topics Concern  . Not  on file  Social History Narrative  . Not on file    Review of Systems: See HPI, otherwise negative ROS  Physical Exam: BP 139/88   Pulse 73   Temp 98.3 F (36.8 C) (Oral)   Ht 5\' 8"  (1.727 m)   Wt 197 lb 12.8 oz (89.7 kg)   BMI 30.08 kg/m  General:   Alert,  Well-developed, well-nourished, pleasant and cooperative in NAD Neck:  Supple; no masses or thyromegaly. No significant cervical adenopathy. Lungs:  Clear throughout to auscultation.   No wheezes, crackles, or rhonchi. No acute distress. Heart:  Regular rate and rhythm; no murmurs, clicks, rubs,  or gallops. Abdomen: Non-distended, normal bowel sounds.  Soft and nontender without appreciable mass or hepatosplenomegaly.  Pulses:  Normal pulses noted. Extremities:  Without clubbing or edema.  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 Reflux symptoms well controlled on twice a day Protonix. I think we can de-escalate PPI therapy.  Recommendations:Decrease Protonix to 40 mg once daily  Repeat colonoscopy in 4 1/2 years  Office visit in 1 year and as needed  Moderate alcohol consumption  Continue to avoid NSAID agents.    Notice: This dictation was prepared with Dragon dictation along with smaller phrase technology. Any transcriptional errors that result from this process are unintentional and may not be corrected upon review.

## 2018-07-20 NOTE — Patient Instructions (Signed)
Decrease Protonix to 40 mg once daily  Repeat colonoscopy in 4 1/2 years  Office visit in 1 year and as needed  Moderate alcohol consumption

## 2018-07-29 ENCOUNTER — Other Ambulatory Visit: Payer: Self-pay | Admitting: Family Medicine

## 2018-07-29 NOTE — Telephone Encounter (Signed)
Ok to refill 

## 2018-08-12 ENCOUNTER — Encounter: Payer: Self-pay | Admitting: Neurology

## 2018-08-12 ENCOUNTER — Telehealth: Payer: Self-pay | Admitting: Neurology

## 2018-08-12 ENCOUNTER — Ambulatory Visit (INDEPENDENT_AMBULATORY_CARE_PROVIDER_SITE_OTHER): Payer: Managed Care, Other (non HMO) | Admitting: Neurology

## 2018-08-12 VITALS — BP 142/86 | HR 87 | Ht 68.0 in | Wt 197.0 lb

## 2018-08-12 DIAGNOSIS — H81399 Other peripheral vertigo, unspecified ear: Secondary | ICD-10-CM | POA: Diagnosis not present

## 2018-08-12 DIAGNOSIS — G479 Sleep disorder, unspecified: Secondary | ICD-10-CM

## 2018-08-12 DIAGNOSIS — E663 Overweight: Secondary | ICD-10-CM | POA: Diagnosis not present

## 2018-08-12 DIAGNOSIS — R42 Dizziness and giddiness: Secondary | ICD-10-CM

## 2018-08-12 DIAGNOSIS — R0683 Snoring: Secondary | ICD-10-CM

## 2018-08-12 DIAGNOSIS — F17200 Nicotine dependence, unspecified, uncomplicated: Secondary | ICD-10-CM

## 2018-08-12 NOTE — Patient Instructions (Addendum)
Your neurological exam shows no obvious abnormality, but slight insecurity with coordination.  I have a few suggestions: As far as further testing, I will order: 1. We will do a brain scan, called MRI and call you with the test results. We will have to schedule you for this on a separate date. This test requires authorization from your insurance, and we will take care of the insurance process. 2. I will also order a MRA (angiogram) of your head to look and the main blood vessel system of your head.  3. I would like for you to have a sleep study to rule out sleep apnea. You may be at risk for sleep apnea.    As far as symptom management:  1. Please drink more water.  2. Reduce your alcohol consumption.  3. Please stop smoking.  4. Change positions slowly.

## 2018-08-12 NOTE — Progress Notes (Signed)
Subjective:    Patient ID: [name] is a 52 y.o. male.  HPI     Star Age, MD, PhD [facility] [address]O. [place], [place] [address]  Dear Dr. Dennard Schaumann,  I saw your patient, [name], upon your kind request in my neurologic clinic today for initial consultation of his dizziness. The patient is unaccompanied today. As you know, [name] is a 52 year old right-handed gentleman with an underlying medical history of hypertension, hyperlipidemia, smoking, reflux disease, anxiety, history of alcohol abuse (per chart review), diverticulitis, anemia, allergies, and borderline obesity, who reports recurrent dizzy spells particularly with standing up and a sense of lightheadedness like he will pass out. Symptoms have been ongoing for about a year. He was previously felt to have side effects with his blood pressure medication and also dehydration. I reviewed your office note from 06/03/2018. Symptoms are worse when he tries to look up. He had multiple surgeries including back surgery, bilateral recent surgery to his shoulder, is currently not on narcotic pain medication. Of note, he is on potentially sedating medications including clonazepam, Flexeril, Effexor. His Epworth sleepiness score is 8 out of 24, fatigue score is 46 out of 63. He is married and lives with his wife, they have 2 children. He smokes 1-1/2 packs per day and drinks alcohol daily about 6 beers per day on an average day, caffeine in the form of coffee, about 3 cups per day in the mornings, he averages about 2 bottles of water per day, one large bottle of soda, and one bottle of Gatorade typically. He snores, he does not sleep well. He feels he has sleep disruption from discomfort in the shoulders. He may need left shoulder replacement surgery soon. He is currently not working.he has had occasional blurry vision. He has not had an eye exam in over 2 years he believes.  His  Past Medical History Is Significant For: Past Medical History:  Diagnosis Date  . Allergy   . Anemia   . Colonic diverticular abscess 12/26/2014  . Diverticulitis   . ETOH abuse   . GAD (generalized anxiety disorder)   . GERD (gastroesophageal reflux disease)   . Hypercholesteremia   . Hypertension     His Past Surgical History Is Significant For: Past Surgical History:  Procedure Laterality Date  . APPENDECTOMY N/A 01/01/2015   Procedure: INCIDENTAL APPENDECTOMY;  Surgeon: Jamesetta So, MD;  Location: [facility];  Service: General;  Laterality: N/A;  . BACK SURGERY     lower back, fusion  . COLONOSCOPY WITH PROPOFOL N/A 12/21/2017   Procedure: COLONOSCOPY WITH PROPOFOL;  Surgeon: Daneil Dolin, MD;  [facility];  Service: Endoscopy;  Laterality: N/A;  9:45am  . GASTRECTOMY N/A 04/14/2016   Procedure: Toniann Ket;  Surgeon: Aviva Signs, MD;  Location: [facility];  Service: General;  Laterality: N/A;  . HEMORRHOID SURGERY    . HERNIA REPAIR     as child  . PARTIAL COLECTOMY N/A 01/01/2015   Procedure: PARTIAL COLECTOMY;  Surgeon: Jamesetta So, MD;  Location: [facility];  Service: General;  Laterality: N/A;  . POLYPECTOMY  12/21/2017   Procedure: POLYPECTOMY;  Surgeon: Daneil Dolin, MD;  [facility];  Service: Endoscopy;;  colon  . ROTATOR CUFF REPAIR Bilateral     His Family History Is Significant For: Family History  Problem Relation Age of Onset  . Stroke Mother   . CAD Father   . Colon  cancer Neg Hx     His Social History Is Significant For: Social History   Socioeconomic History  . Marital status: Married    Spouse name: Not on file  . Number of children: Not on file  . Years of education: Not on file  . Highest education level: Not on file  Occupational History  . Not on file  Social Needs  . Financial resource strain: Not on file  . Food insecurity:    Worry: Not on file    Inability: Not on file  . Transportation needs:    Medical: Not  on file    Non-medical: Not on file  Tobacco Use  . Smoking status: Current Every Day Smoker    Packs/day: 1.00    Years: 35.00    Pack years: 35.00    Types: Cigarettes  . Smokeless tobacco: Never Used  Substance and Sexual Activity  . Alcohol use: Yes    Alcohol/week: 0.0 standard drinks    Comment: patient states "about 5 or 6 a day" (beers)  . Drug use: No  . Sexual activity: Yes  Lifestyle  . Physical activity:    Days per week: Not on file    Minutes per session: Not on file  . Stress: Not on file  Relationships  . Social connections:    Talks on phone: Not on file    Gets together: Not on file    Attends religious service: Not on file    Active member of club or organization: Not on file    Attends meetings of clubs or organizations: Not on file    Relationship status: Not on file  Other Topics Concern  . Not on file  Social History Narrative  . Not on file    His Allergies Are:  No Known Allergies:   His Current Medications Are:  Outpatient Encounter Medications as of 08/12/2018  Medication Sig  . acetaminophen (TYLENOL) 500 MG tablet Take 500 mg by mouth every 6 (six) hours as needed for moderate pain or headache.  Marland Kitchen amLODipine (NORVASC) 10 MG tablet Take 1 tablet (10 mg total) by mouth daily. This replaces metoprolol  . atorvastatin (LIPITOR) 40 MG tablet TAKE 1 TABLET BY MOUTH EVERY DAY  . carvedilol (COREG) 12.5 MG tablet Take 1 tablet (12.5 mg total) by mouth 2 (two) times daily with a meal.  . clonazePAM (KLONOPIN) 0.5 MG tablet TAKE 1 TABLET BY MOUTH TWICE A DAY AS NEEDED FOR ANXIETY  . cyclobenzaprine (FLEXERIL) 10 MG tablet TAKE 1 TABLET BY MOUTH AT BEDTIME AS NEEDED FOR MUSCLE SPASM  . ferrous sulfate 325 (65 FE) MG EC tablet Take 325 mg by mouth daily with breakfast.  . fluticasone (FLONASE) 50 MCG/ACT nasal spray Place 1 spray into both nostrils daily as needed for allergies.   . Homeopathic Products (ZICAM COLD REMEDY NA) Place 1 spray into both  nostrils daily as needed (for allergies).  . hydrochlorothiazide (MICROZIDE) 12.5 MG capsule TAKE ONE CAPSULE BY MOUTH EVERY DAY  . losartan (COZAAR) 50 MG tablet TAKE 1 TABLET BY MOUTH EVERY DAY  . pantoprazole (PROTONIX) 40 MG tablet Take 1 tablet (40 mg total) by mouth 2 (two) times daily.  Marland Kitchen venlafaxine XR (EFFEXOR-XR) 150 MG 24 hr capsule TAKE 1 CAPSULE BY MOUTH IN THE MORNING  . [DISCONTINUED] azithromycin (ZITHROMAX) 250 MG tablet 2 tabs poqday1, 1 tab poqday 2-5 (Patient not taking: Reported on 07/20/2018)  . [DISCONTINUED] HYDROcodone-homatropine (HYCODAN) 5-1.5 MG/5ML syrup Take 5 mLs by  mouth every 8 (eight) hours as needed for cough. (Patient not taking: Reported on 07/20/2018)  . [DISCONTINUED] levocetirizine (XYZAL) 5 MG tablet TAKE 1 TABLET BY MOUTH EVERY DAY IN THE EVENING (Patient not taking: Reported on 07/20/2018)   No facility-administered encounter medications on file as of 08/12/2018.   : Review of Systems:  Out of a complete 14 point review of systems, all are reviewed and negative with the exception of these symptoms as listed below:  Review of Systems  Neurological:       Pt presents today to discuss his dizziness. Pt gets dizzy when he looks up. This happens about 3 times per day and lasts 5-30 seconds. Pt has never had a sleep study but does endorse snoring.  Epworth Sleepiness Scale 0= would never doze 1= slight chance of dozing 2= moderate chance of dozing 3= high chance of dozing  Sitting and reading: 2 Watching TV: 1 Sitting inactive in a public place (ex. Theater or meeting): 1 As a passenger in a car for an hour without a break: 1 Lying down to rest in the afternoon: 2 Sitting and talking to someone: 0 Sitting quietly after lunch (no alcohol): 1 In a car, while stopped in traffic: 0 Total: 8     Objective:  Neurological Exam  Physical Exam Physical Examination:   Vitals:   08/12/18 1420  BP: (!) 142/86  Pulse: 87   On orthostatic testing:  Lying blood pressure and pulse were 125/78 with a pulse of 75, sitting 142/86 with a pulse of 87, standing 128/77 with a pulse of 80. With the neck extension he has mild sense of lightheadedness but no vertiginous symptoms, no orthostatic lightheadedness.  General Examination: The patient is a very pleasant 52 y.o. male in no acute distress. He appears well-developed and well-nourished and well groomed.   HEENT: Normocephalic, atraumatic, pupils are equal, round and reactive to light and accommodation. Extraocular tracking is good without limitation to gaze excursion or nystagmus noted. Normal smooth pursuit is noted. Hearing is grossly intact. Face is symmetric with normal facial animation and normal facial sensation. Speech is clear with no dysarthria noted. There is no hypophonia. There is no lip, neck/head, jaw or voice tremor. Neck is supple with full range of passive and active motion. There are no carotid bruits on auscultation. Oropharynx exam reveals: mild mouth dryness, adequate dental hygiene and moderate airway crowding, due to tonsillar size of 2+, large uvula, thicker soft palate. Mallampati is class II. Neck circumference is 18-1/2 inches. He has a minimal overbite. Tongue protrudes centrally and palate elevates symmetrically.  Chest: Clear to auscultation without wheezing, rhonchi or crackles noted.  Heart: S1+S2+0, regular and normal without murmurs, rubs or gallops noted.   Abdomen: Soft, non-tender and non-distended with normal bowel sounds appreciated on auscultation.  Extremities: There is no pitting edema in the distal lower extremities bilaterally. Pedal pulses are intact.  Skin: Warm and dry without trophic changes noted.  Musculoskeletal: exam reveals no obvious joint deformities, tenderness or joint swelling or erythema.   Neurologically:  Mental status: The patient is awake, alert and oriented in all 4 spheres. His immediate and remote memory, attention, language skills  and fund of knowledge are appropriate. There is no evidence of aphasia, agnosia, apraxia or anomia. Speech is clear with normal prosody and enunciation. Thought process is linear. Mood is normal and affect is normal.  Cranial nerves II - XII are as described above under HEENT exam. In addition: shoulder shrug is  normal with equal shoulder height noted. Motor exam: Normal bulk, strength and tone is noted. There is no drift, no resting tremor. He has a slight postural and action tremor in the left more than right upper extremities. Romberg is negative with exception of minimal sway. Reflexes are about 1+ throughout. Fine motor skills are intact, on cerebellar testing he has slight insecurity with finger to nose testing bilaterally, left more noticeable than right, heel to shin is slightly insecure bilaterally, perhaps a little worse on the left. Sensory exam: intact to light touch.  Gait, station and balance: He stands easily. No veering to one side is noted. No leaning to one side is noted. Posture is age-appropriate and stance is narrow based. Gait shows normal stride length and normal pace. No problems turning are noted. He has mild difficulty with tandem walk.  Assessment and Plan:   In summary, [name] is a very pleasant 52 y.o.-year old male with an underlying medical history of hypertension, hyperlipidemia, smoking, reflux disease, anxiety, history of alcohol abuse (per chart review), diverticulitis, anemia, allergies, and borderline obesity, who presents for evaluation of his recurrent dizzy spells of over one years duration. He has on examination no telltale focal neurological abnormality but has mild insecurity with coordination and balance. Upon neck extension he feels mildly lightheaded. I suggested a few things today. He is advised to stay better hydrated with water in general, changes in position should be done slowly. He is cautioned about his daily alcohol consumption and advised to  reduce/scale back. He is furthermore strongly advised to quit smoking. I believe he may be at risk for obstructive sleep apnea. He is advised to proceed with a sleep study. Furthermore, I would recommend a brain MRI to rule out a structural cause of his symptoms and also MRA head to look at the circulation and any evidence of intracranial stenosis.Will see him back after his testing is completed. I did not suggest any new medications. We will keep them posted as to his test results in the interim as well. I answered all his questions today and he was in agreement. Thank you very much for allowing me to participate in the care of this nice patient. If I can be of any further assistance to you please do not hesitate to call me at [phone].  Sincerely,   Star Age, MD, PhD

## 2018-08-12 NOTE — Telephone Encounter (Signed)
[employer] order sent to GI. They obtain the auth and will reach out to the pt to schedule.  °

## 2018-08-13 ENCOUNTER — Other Ambulatory Visit: Payer: Self-pay | Admitting: Family Medicine

## 2018-08-13 DIAGNOSIS — R000 Tachycardia, unspecified: Secondary | ICD-10-CM

## 2018-09-02 ENCOUNTER — Encounter (HOSPITAL_COMMUNITY): Payer: Self-pay

## 2018-09-02 ENCOUNTER — Other Ambulatory Visit: Payer: Self-pay | Admitting: Family Medicine

## 2018-09-02 ENCOUNTER — Ambulatory Visit (HOSPITAL_COMMUNITY): Payer: Managed Care, Other (non HMO) | Attending: Orthopedic Surgery

## 2018-09-02 DIAGNOSIS — R29898 Other symptoms and signs involving the musculoskeletal system: Secondary | ICD-10-CM | POA: Diagnosis not present

## 2018-09-02 DIAGNOSIS — M25612 Stiffness of left shoulder, not elsewhere classified: Secondary | ICD-10-CM

## 2018-09-02 DIAGNOSIS — M25611 Stiffness of right shoulder, not elsewhere classified: Secondary | ICD-10-CM | POA: Diagnosis present

## 2018-09-02 DIAGNOSIS — M25512 Pain in left shoulder: Secondary | ICD-10-CM | POA: Diagnosis present

## 2018-09-02 DIAGNOSIS — M25511 Pain in right shoulder: Secondary | ICD-10-CM | POA: Diagnosis present

## 2018-09-02 DIAGNOSIS — I10 Essential (primary) hypertension: Secondary | ICD-10-CM

## 2018-09-02 DIAGNOSIS — G8929 Other chronic pain: Secondary | ICD-10-CM | POA: Diagnosis present

## 2018-09-02 NOTE — Therapy (Signed)
[facility] [address] [place], [place], [address] Phone: [phone]   Fax:  [phone]  Occupational Therapy Evaluation  Patient Details  Name: [name] MRN: [id number] Date of Birth: 05/14/1966 Referring Provider (OT): Dr. Justice Britain   Encounter Date: 09/02/2018  OT End of Session - 09/02/18 1620    Visit Number  1    Number of Visits  8    Date for OT Re-Evaluation  09/30/18    Authorization Type  [employer] Managed    Authorization Time Period  no copay. 40 visit limit OT/SLP     Authorization - Visit Number  1    Authorization - Number of Visits  40    OT Start Time  1350    OT Stop Time  1430    OT Time Calculation (min)  40 min    Activity Tolerance  Patient tolerated treatment well    Behavior During Therapy  WFL for tasks assessed/performed       Past Medical History:  Diagnosis Date  . Allergy   . Anemia   . Colonic diverticular abscess 12/26/2014  . Diverticulitis   . ETOH abuse   . GAD (generalized anxiety disorder)   . GERD (gastroesophageal reflux disease)   . Hypercholesteremia   . Hypertension     Past Surgical History:  Procedure Laterality Date  . APPENDECTOMY N/A 01/01/2015   Procedure: INCIDENTAL APPENDECTOMY;  Surgeon: Jamesetta So, MD;  Location: [facility];  Service: General;  Laterality: N/A;  . BACK SURGERY     lower back, fusion  . COLONOSCOPY WITH PROPOFOL N/A 12/21/2017   Procedure: COLONOSCOPY WITH PROPOFOL;  Surgeon: Daneil Dolin, MD;  [facility];  Service: Endoscopy;  Laterality: N/A;  9:45am  . GASTRECTOMY N/A 04/14/2016   Procedure: Toniann Ket;  Surgeon: Aviva Signs, MD;  Location: [facility];  Service: General;  Laterality: N/A;  . HEMORRHOID SURGERY    . HERNIA REPAIR     as child  . PARTIAL COLECTOMY N/A 01/01/2015   Procedure: PARTIAL COLECTOMY;  Surgeon: Jamesetta So, MD;  Location: [facility];  Service: General;  Laterality: N/A;  . POLYPECTOMY  12/21/2017   Procedure: POLYPECTOMY;  Surgeon: Daneil Dolin, MD;  [facility];  Service: Endoscopy;;  colon  . ROTATOR CUFF REPAIR Bilateral     There were no vitals filed for this visit.  Subjective Assessment - 09/02/18 1354    Subjective   S: It's been getting worse recently.     Pertinent History  Patient is a 52 y/o male S/P bilateral shoulder pain which has been a chronic issue for several years. MRI shows positive sign of shoulder impingement in the left shoulder. Patient has been on short term disability for just over a month due to severe effect the pain has on his ability to complete his work related tasks. Dr. Onnie Graham has referred patient to occupational therapy for evaluation and treatment.     Patient Stated Goals  To have decreased pain in order for him to be able to return to completing activities that he needs to.     Currently in Pain?  Yes   7/10 with movement   Pain Score  3     Pain Location  Shoulder    Pain Orientation  Left    Pain Descriptors / Indicators  Aching    Pain Type  Chronic pain    Pain Radiating Towards  Stays to  in upper arm    Pain Onset  More than a month ago    Pain Frequency  Constant    Aggravating Factors   movement and cold air    Pain Relieving Factors  rest, ice    Effect of Pain on Daily Activities  pain has made him stop working for short term.     Multiple Pain Sites  No        [facility] OT Assessment - 09/02/18 1356      Assessment   Medical Diagnosis  bilateral shoulder pain    Referring Provider (OT)  Dr. 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       Mobility   Mobility Status  Independent      Written Expression   Dominant Hand  Right      Vision - History   Baseline Vision  No visual deficits      Cognition   Overall Cognitive Status  Within Functional Limits for tasks assessed      Observation/Other Assessments   Focus on Therapeutic Outcomes (FOTO)   Complete next session      Posture/Postural Control   Posture/Postural Control  Postural limitations    Postural Limitations  Rounded Shoulders;Forward head      ROM / Strength   AROM / PROM / Strength  AROM;PROM;Strength      Palpation   Palpation comment  Mod fascial restrictions in bilateral upper arms, trapezius, and scapularis region.       AROM   Overall AROM Comments  Assessed seated. IR/er abducted    AROM Assessment Site  Shoulder    Right/Left Shoulder  Left;Right    Right Shoulder Flexion  139 Degrees    Right Shoulder ABduction  122 Degrees    Right Shoulder Internal Rotation  65 Degrees    Right Shoulder External Rotation  56 Degrees    Left Shoulder Flexion  136 Degrees    Left Shoulder ABduction  130 Degrees    Left Shoulder Internal Rotation  40 Degrees    Left Shoulder External Rotation  62 Degrees      PROM   Overall PROM Comments  Assessed supine. IR/er abducted.    PROM Assessment Site  Shoulder    Right/Left Shoulder  Left;Right    Right Shoulder Flexion  170 Degrees    Right Shoulder ABduction  170 Degrees    Right Shoulder Internal Rotation  90 Degrees    Right Shoulder External Rotation  90 Degrees    Left Shoulder Flexion  170 Degrees    Left Shoulder ABduction  180 Degrees    Left Shoulder Internal Rotation  90 Degrees    Left Shoulder  External Rotation  90 Degrees      Strength   Overall Strength Comments  Assessed seated. IR/er abducted    Strength Assessment Site  Shoulder    Right/Left Shoulder  Left;Right    Right Shoulder Flexion  4-/5    Right Shoulder ABduction  4+/5    Right Shoulder Internal Rotation  4+/5    Right Shoulder External Rotation  4+/5    Left Shoulder Flexion  4-/5    Left Shoulder ABduction  4+/5    Left Shoulder Internal Rotation  4+/5    Left Shoulder External Rotation  4+/5                      OT Education - 09/02/18 1619    Education Details  Shoulder stretches    Person(s) Educated  Patient    Methods  Explanation;Demonstration;Verbal cues;Handout    Comprehension  Returned demonstration;Verbalized understanding       OT Short Term Goals - 09/02/18 1630      OT SHORT TERM GOAL #1   Title  Patient will be educated and independent with his HEP in order to faciliate his progress in therapy and increase functional use of his BUEs during all daily and work related tasks.     Time  4    Period  Weeks    Status  New    Target Date  09/30/18      OT SHORT TERM GOAL #2   Title  Patient will increase BUE A/ROM by 10 degrees or more where needed in order to be able to complete functional reaching tasks with less discomfort.     Time  4    Period  Weeks    Status  New      OT SHORT TERM GOAL #3   Title  Patient will report a decrease in pain level in BUE when completing daily and work tasks of approximately 4/10 or less.     Time  4    Period  Weeks    Status  New      OT SHORT TERM GOAL #4   Title  Patient will decrease fascial restrictions in BUE to min amount or less in order to increase functional mobility needed for reaching tasks.     Time  4    Period  Weeks    Status  New      OT SHORT TERM GOAL #5   Title  Patient will increase Bilateral scapular strength by increasing overall posture while being able to demonstrate proper lifting techniques that will be  required at work.     Time  4    Period  Weeks    Status  New               Plan - 09/02/18 1622    Clinical Impression Statement  A: Patient is a 52 y/o male S/P bilateral shoulder pain which has been ongoing for many years now causing increased pain, fascial restrictions, and decreased ROM and strength resulting in difficulty completing daily and work related tasks requiring use of BUEs.     Occupational Profile and client history currently impacting functional performance  Patient is motivated to participate in therapy.     Occupational performance deficits (Please refer to evaluation for details):  ADL's;Work;Rest and  Sleep;IADL's;Leisure    Rehab Potential  Excellent    Current Impairments/barriers affecting progress:  Chronic shoulder pain. history of bilateral shoulder issues.     OT Frequency  2x / week    OT Duration  4 weeks    OT Treatment/Interventions  Self-care/ADL training;Therapeutic exercise;Manual Therapy;Neuromuscular education;Ultrasound;Iontophoresis;Therapeutic activities;DME and/or AE instruction;Cryotherapy;Electrical Stimulation;Moist Heat;Passive range of motion;Patient/family education    Plan  P: Pt will benefit from skilled OT services to increase functional performance during daily/work related tasks when using his BUEs. Treatment Plan: Myofascial release, manual stretching, A/ROM, general shoulder and scapular strengthening. Modalities PRN.     Clinical Decision Making  Several treatment options, min-mod task modification necessary    Consulted and Agree with Plan of Care  Patient       Patient will benefit from skilled therapeutic intervention in order to improve the following deficits and impairments:  Pain, Decreased range of motion, Impaired UE functional use, Increased fascial restrictions  Visit Diagnosis: Other symptoms and signs involving the musculoskeletal system - Plan: Ot plan of care cert/re-cert  Chronic left shoulder pain - Plan: Ot  plan of care cert/re-cert  Chronic right shoulder pain - Plan: Ot plan of care cert/re-cert  Stiffness of left shoulder, not elsewhere classified - Plan: Ot plan of care cert/re-cert  Stiffness of right shoulder, not elsewhere classified - Plan: Ot plan of care cert/re-cert    Problem List Patient Active Problem List   Diagnosis Date Noted  . Heme + stool 09/17/2017  . Hemorrhoids 09/17/2017  . Peptic ulcer, acute with perforation ([place]) 04/14/2016  . Disorder of appendix 12/31/2014  . Diverticulitis of colon with perforation 12/30/2014  . Colonic diverticular abscess 12/26/2014  . ETOH abuse 12/26/2014  . Acute colitis 12/08/2014  . Hyperglycemia 12/08/2014  . Obesity 12/08/2014  . Colitis 12/08/2014  . GAD (generalized anxiety disorder)   . Atypical chest pain 02/13/2013  . Tobacco abuse 02/13/2013  . Other and unspecified hyperlipidemia 02/13/2013  . Allergy   . Hypertension   . Anxiety state 05/25/2009  . Essential hypertension 05/25/2009  . DEGENERATIVE JOINT DISEASE 05/25/2009   Ailene Ravel, OTR/L,CBIS  [phone]  09/02/2018, 4:35 PM  [facility] [address] [place], [place], [address] Phone: [phone]   Fax:  [phone]  Name: [name] MRN: [id number] Date of Birth: Jan 01, 1966

## 2018-09-02 NOTE — Telephone Encounter (Signed)
I called pt to discuss. No answer, left a message asking him to call me back. 

## 2018-09-02 NOTE — Telephone Encounter (Signed)
Please call pt and advise patient that his MRI scans were not approved by his insurance. We will have to monitor him clinically. I had also ordered a sleep study. I do not see if that he has been scheduled yet. Please look into this.

## 2018-09-02 NOTE — Patient Instructions (Signed)

## 2018-09-02 NOTE — Telephone Encounter (Signed)
[name] with [facility] informed me the MRI/MRA was not approved by [place]. Based on [employer] head imaging guidelines, we cannot approve this request. Advanced imaging is not supported for patients with uncomplicated syncope (transient loss of consciousness) or near syncope. The clinical information provided does not describe signs or symptoms of a primary neurological cause of syncope and therefore the request is not indicated at this time.  If you would like to do a peer to peer the phone number is [phone] and the case number is [id number]. Right now the member is scheduled at [facility]  for Monday 09/06/18.

## 2018-09-03 NOTE — Telephone Encounter (Signed)
I called pt again to discuss. No answer, left a message asking him to call me back. 

## 2018-09-06 ENCOUNTER — Other Ambulatory Visit: Payer: Managed Care, Other (non HMO)

## 2018-09-06 NOTE — Telephone Encounter (Signed)
I called pt and discussed Dr. Guadelupe Sabin recommendations with him. He wants to hold off on scheduling his sleep study right now because he is dealing with shoulder problems right now. Pt verbalized understanding of the recommendations.

## 2018-09-07 ENCOUNTER — Encounter (HOSPITAL_COMMUNITY): Payer: Self-pay

## 2018-09-07 ENCOUNTER — Ambulatory Visit (HOSPITAL_COMMUNITY): Payer: Managed Care, Other (non HMO)

## 2018-09-07 DIAGNOSIS — M25512 Pain in left shoulder: Secondary | ICD-10-CM

## 2018-09-07 DIAGNOSIS — M25612 Stiffness of left shoulder, not elsewhere classified: Secondary | ICD-10-CM

## 2018-09-07 DIAGNOSIS — R29898 Other symptoms and signs involving the musculoskeletal system: Secondary | ICD-10-CM | POA: Diagnosis not present

## 2018-09-07 DIAGNOSIS — M25611 Stiffness of right shoulder, not elsewhere classified: Secondary | ICD-10-CM

## 2018-09-07 DIAGNOSIS — G8929 Other chronic pain: Secondary | ICD-10-CM

## 2018-09-07 DIAGNOSIS — M25511 Pain in right shoulder: Secondary | ICD-10-CM

## 2018-09-07 NOTE — Therapy (Signed)
[facility] [place], [place], [address] Phone: [phone]   Fax:  [phone]  Occupational Therapy Treatment  Patient Details  Name: [name] MRN: [id number] Date of Birth: [birth year] Referring Provider (OT): Dr. Justice Britain   Encounter Date: 09/07/2018  OT End of Session - 09/07/18 1512    Visit Number  2    Number of Visits  8    Date for OT Re-Evaluation  09/30/18    Authorization Type  [employer] Managed    Authorization Time Period  no copay. 40 visit limit OT/SLP     Authorization - Visit Number  2    Authorization - Number of Visits  40    OT Start Time  0626    OT Stop Time  1515    OT Time Calculation (min)  39 min    Activity Tolerance  Patient tolerated treatment well    Behavior During Therapy  WFL for tasks assessed/performed       Past Medical History:  Diagnosis Date  . Allergy   . Anemia   . Colonic diverticular abscess 12/26/2014  . Diverticulitis   . ETOH abuse   . GAD (generalized anxiety disorder)   . GERD (gastroesophageal reflux disease)   . Hypercholesteremia   . Hypertension     Past Surgical History:  Procedure Laterality Date  . APPENDECTOMY N/A 01/01/2015   Procedure: INCIDENTAL APPENDECTOMY;  Surgeon: Jamesetta So, MD;  Location: [facility];  Service: General;  Laterality: N/A;  . BACK SURGERY     lower back, fusion  . COLONOSCOPY WITH PROPOFOL N/A 12/21/2017   Procedure: COLONOSCOPY WITH PROPOFOL;  Surgeon: Daneil Dolin, MD;  [facility];  Service: Endoscopy;  Laterality: N/A;  9:45am  . GASTRECTOMY N/A 04/14/2016   Procedure: Toniann Ket;  Surgeon: Aviva Signs, MD;  Location: [facility];  Service: General;  Laterality: N/A;  . HEMORRHOID SURGERY    . HERNIA REPAIR     as child  . PARTIAL COLECTOMY N/A 01/01/2015   Procedure: PARTIAL COLECTOMY;  Surgeon: Jamesetta So, MD;  Location: [facility];  Service: General;  Laterality: N/A;  . POLYPECTOMY  12/21/2017    Procedure: POLYPECTOMY;  Surgeon: Daneil Dolin, MD;  [facility];  Service: Endoscopy;;  colon  . ROTATOR CUFF REPAIR Bilateral     There were no vitals filed for this visit.  Subjective Assessment - 09/07/18 1458    Subjective   S: I've tried some of the stretches.     Currently in Pain?  Yes    Pain Score  4     Pain Location  Shoulder    Pain Orientation  Left    Pain Descriptors / Indicators  Aching    Pain Type  Chronic pain         [facility] OT Assessment - 09/07/18 1458      Assessment   Medical Diagnosis  bilateral shoulder pain      Precautions   Precautions  None               OT Treatments/Exercises (OP) - 09/07/18 1459      Exercises   Exercises  Shoulder      Shoulder Exercises: Supine   Protraction  PROM;5 reps;Both    Horizontal ABduction  PROM;5 reps;Both    External Rotation  PROM;5 reps;Both    Internal Rotation  PROM;5 reps;Both    Flexion  PROM;5 reps;Both  ABduction  PROM;5 reps;Both      Shoulder Exercises: Standing   Protraction  AROM;10 reps    Horizontal ABduction  AROM;10 reps    External Rotation  AROM;10 reps    Internal Rotation  AROM;10 reps    Flexion  AROM;10 reps    ABduction  AROM;10 reps    Extension  Theraband;10 reps    Theraband Level (Shoulder Extension)  Level 2 (Red)    Row  Theraband;10 reps    Theraband Level (Shoulder Row)  Level 2 (Red)    Retraction  Theraband;10 reps    Theraband Level (Shoulder Retraction)  Level 2 (Red)      Shoulder Exercises: ROM/Strengthening   X to V Arms  10X A/ROM    Proximal Shoulder Strengthening, Seated  10X A/ROM 4 rest breaks      Manual Therapy   Manual Therapy  Soft tissue mobilization    Manual therapy comments  Manual therapy completed prior to exercises.     Soft tissue mobilization  Myofascial release and manual stretching completed to BUE upper arm, trapezius, and scapularis region.             OT Education - 09/07/18 1500    Education  Details  Reviewed therapy goals with patient, Pt provided with red theraband for scapular strengthening HEP.    Person(s) Educated  Patient    Methods  Explanation;Demonstration;Verbal cues;Tactile cues;Handout    Comprehension  Verbalized understanding;Returned demonstration       OT Short Term Goals - 09/07/18 1501      OT SHORT TERM GOAL #1   Title  Patient will be educated and independent with his HEP in order to faciliate his progress in therapy and increase functional use of his BUEs during all daily and work related tasks.     Time  4    Period  Weeks    Status  On-going      OT SHORT TERM GOAL #2   Title  Patient will increase BUE A/ROM by 10 degrees or more where needed in order to be able to complete functional reaching tasks with less discomfort.     Time  4    Period  Weeks    Status  On-going      OT SHORT TERM GOAL #3   Title  Patient will report a decrease in pain level in BUE when completing daily and work tasks of approximately 4/10 or less.     Time  4    Period  Weeks    Status  On-going      OT SHORT TERM GOAL #4   Title  Patient will decrease fascial restrictions in BUE to min amount or less in order to increase functional mobility needed for reaching tasks.     Time  4    Period  Weeks    Status  On-going      OT SHORT TERM GOAL #5   Title  Patient will increase Bilateral scapular strength by increasing overall posture while being able to demonstrate proper lifting techniques that will be required at work.     Time  4    Period  Weeks    Status  On-going               Plan - 09/07/18 1638    Clinical Impression Statement  A: Initiated myofascial release and manual stretching to BUE, A/ROM was completed standing and scapular strengthening was provided for HEP. VC for form  and technique were needed during session. Max fascial restrictions noted in bilateral upper arms, trapezius, and scapularis with manual techniques completed to address.      Plan  P: Add UBE bike. Follow up on updated HEP. Focus on scapular strength to increase posture. Attempt Y arms.     Consulted and Agree with Plan of Care  Patient       Patient will benefit from skilled therapeutic intervention in order to improve the following deficits and impairments:  Pain, Decreased range of motion, Impaired UE functional use, Increased fascial restrictions  Visit Diagnosis: Other symptoms and signs involving the musculoskeletal system  Chronic left shoulder pain  Chronic right shoulder pain  Stiffness of left shoulder, not elsewhere classified  Stiffness of right shoulder, not elsewhere classified    Problem List Patient Active Problem List   Diagnosis Date Noted  . Heme + stool 09/17/2017  . Hemorrhoids 09/17/2017  . Peptic ulcer, acute with perforation ([place]) 04/14/2016  . Disorder of appendix 12/31/2014  . Diverticulitis of colon with perforation 12/30/2014  . Colonic diverticular abscess 12/26/2014  . ETOH abuse 12/26/2014  . Acute colitis 12/08/2014  . Hyperglycemia 12/08/2014  . Obesity 12/08/2014  . Colitis 12/08/2014  . GAD (generalized anxiety disorder)   . Atypical chest pain 02/13/2013  . Tobacco abuse 02/13/2013  . Other and unspecified hyperlipidemia 02/13/2013  . Allergy   . Hypertension   . Anxiety state 05/25/2009  . Essential hypertension 05/25/2009  . DEGENERATIVE JOINT DISEASE 05/25/2009   Ailene Ravel, OTR/L,CBIS  [phone]  09/07/2018, 4:40 PM  [facility] [address] [place], [place], [address] Phone: [phone]   Fax:  [phone]  Name: LONDELL NOLL MRN: [id number] Date of Birth: Jul 22, 1966

## 2018-09-07 NOTE — Patient Instructions (Signed)

## 2018-09-09 ENCOUNTER — Encounter (HOSPITAL_COMMUNITY): Payer: Self-pay

## 2018-09-09 ENCOUNTER — Ambulatory Visit (HOSPITAL_COMMUNITY): Payer: Managed Care, Other (non HMO)

## 2018-09-09 DIAGNOSIS — G8929 Other chronic pain: Secondary | ICD-10-CM

## 2018-09-09 DIAGNOSIS — M25511 Pain in right shoulder: Secondary | ICD-10-CM

## 2018-09-09 DIAGNOSIS — M25512 Pain in left shoulder: Secondary | ICD-10-CM

## 2018-09-09 DIAGNOSIS — M25611 Stiffness of right shoulder, not elsewhere classified: Secondary | ICD-10-CM

## 2018-09-09 DIAGNOSIS — R29898 Other symptoms and signs involving the musculoskeletal system: Secondary | ICD-10-CM

## 2018-09-09 DIAGNOSIS — M25612 Stiffness of left shoulder, not elsewhere classified: Secondary | ICD-10-CM

## 2018-09-09 NOTE — Therapy (Signed)
[place], [place], [address] Phone: [phone]   Fax:  [phone]  Occupational Therapy Treatment  Patient Details  Name: [name] MRN: [id number] Date of Birth: [birth year] Referring Provider (OT): Dr. Justice Britain   Encounter Date: 09/09/2018  OT End of Session - 09/09/18 1429    Visit Number  3    Number of Visits  8    Date for OT Re-Evaluation  09/30/18    Authorization Type  [employer] Managed    Authorization Time Period  no copay. 40 visit limit OT/SLP     Authorization - Visit Number  3    Authorization - Number of Visits  40    OT Start Time  1350    OT Stop Time  1430    OT Time Calculation (min)  40 min    Activity Tolerance  Patient tolerated treatment well    Behavior During Therapy  WFL for tasks assessed/performed       Past Medical History:  Diagnosis Date  . Allergy   . Anemia   . Colonic diverticular abscess 12/26/2014  . Diverticulitis   . ETOH abuse   . GAD (generalized anxiety disorder)   . GERD (gastroesophageal reflux disease)   . Hypercholesteremia   . Hypertension     Past Surgical History:  Procedure Laterality Date  . APPENDECTOMY N/A 01/01/2015   Procedure: INCIDENTAL APPENDECTOMY;  Surgeon: Jamesetta So, MD;  Location: [facility];  Service: General;  Laterality: N/A;  . BACK SURGERY     lower back, fusion  . COLONOSCOPY WITH PROPOFOL N/A 12/21/2017   Procedure: COLONOSCOPY WITH PROPOFOL;  Surgeon: Daneil Dolin, MD;  [facility];  Service: Endoscopy;  Laterality: N/A;  9:45am  . GASTRECTOMY N/A 04/14/2016   Procedure: Toniann Ket;  Surgeon: Aviva Signs, MD;  Location: [facility];  Service: General;  Laterality: N/A;  . HEMORRHOID SURGERY    . HERNIA REPAIR     as child  . PARTIAL COLECTOMY N/A 01/01/2015   Procedure: PARTIAL COLECTOMY;  Surgeon: Jamesetta So, MD;  Location: [facility];  Service: General;  Laterality: N/A;  . POLYPECTOMY  12/21/2017   Procedure: POLYPECTOMY;  Surgeon: Daneil Dolin, MD;  [facility];  Service: Endoscopy;;  colon  . ROTATOR CUFF REPAIR Bilateral     There were no vitals filed for this visit.      [facility] OT Assessment - 09/09/18 1414      Assessment   Medical Diagnosis  bilateral shoulder pain      Precautions   Precautions  None               OT Treatments/Exercises (OP) - 09/09/18 1414      Exercises   Exercises  Shoulder      Shoulder Exercises: Supine   Protraction  PROM;5 reps;Both    Horizontal ABduction  PROM;5 reps;Both    External Rotation  PROM;5 reps;Both    Internal Rotation  PROM;5 reps;Both    Flexion  PROM;5 reps;Both    ABduction  PROM;5 reps;Both      Shoulder Exercises: Standing   Protraction  AROM;10 reps    Horizontal ABduction  AROM;10 reps    External Rotation  AROM;10 reps    Internal Rotation  AROM;10 reps    Flexion  AROM;10 reps    ABduction  AROM;10 reps      Shoulder Exercises: ROM/Strengthening   UBE (Upper Arm  Bike)  Level 2 2' forward 2' reverse   pace: 3.5-4.0   X to V Arms  10X A/ROM    Proximal Shoulder Strengthening, Seated  10X A/ROM 4 rest breaks    Other ROM/Strengthening Exercises  Y arms lift off. 10X      Manual Therapy   Manual Therapy  Soft tissue mobilization    Manual therapy comments  Manual therapy completed prior to exercises.     Soft tissue mobilization  Myofascial release and manual stretching completed to BUE upper arm, trapezius, and scapularis region.               OT Short Term Goals - 09/07/18 1501      OT SHORT TERM GOAL #1   Title  Patient will be educated and independent with his HEP in order to faciliate his progress in therapy and increase functional use of his BUEs during all daily and work related tasks.     Time  4    Period  Weeks    Status  On-going      OT SHORT TERM GOAL #2   Title  Patient will increase BUE A/ROM by 10 degrees or more where needed in order to be able to  complete functional reaching tasks with less discomfort.     Time  4    Period  Weeks    Status  On-going      OT SHORT TERM GOAL #3   Title  Patient will report a decrease in pain level in BUE when completing daily and work tasks of approximately 4/10 or less.     Time  4    Period  Weeks    Status  On-going      OT SHORT TERM GOAL #4   Title  Patient will decrease fascial restrictions in BUE to min amount or less in order to increase functional mobility needed for reaching tasks.     Time  4    Period  Weeks    Status  On-going      OT SHORT TERM GOAL #5   Title  Patient will increase Bilateral scapular strength by increasing overall posture while being able to demonstrate proper lifting techniques that will be required at work.     Time  4    Period  Weeks    Status  On-going               Plan - 09/09/18 1430    Clinical Impression Statement  A: Added UBE bike, and Y arms lift off to continue working on scapular strengthening. patient reports mild soreness during session although able to complete all exercises requested. VC for form and technique. patient presented with moderate fascial restrictions in bilateral upper trapezius region.     Plan  P: Continue with scapular strengthening to increase posture. Resume scapular strengthening.     Consulted and Agree with Plan of Care  Patient       Patient will benefit from skilled therapeutic intervention in order to improve the following deficits and impairments:  Pain, Decreased range of motion, Impaired UE functional use, Increased fascial restrictions  Visit Diagnosis: Other symptoms and signs involving the musculoskeletal system  Chronic left shoulder pain  Chronic right shoulder pain  Stiffness of left shoulder, not elsewhere classified  Stiffness of right shoulder, not elsewhere classified    Problem List Patient Active Problem List   Diagnosis Date Noted  . Heme + stool 09/17/2017  . Hemorrhoids  09/17/2017  . Peptic ulcer, acute with perforation ([place]) 04/14/2016  . Disorder of appendix 12/31/2014  . Diverticulitis of colon with perforation 12/30/2014  . Colonic diverticular abscess 12/26/2014  . ETOH abuse 12/26/2014  . Acute colitis 12/08/2014  . Hyperglycemia 12/08/2014  . Obesity 12/08/2014  . Colitis 12/08/2014  . GAD (generalized anxiety disorder)   . Atypical chest pain 02/13/2013  . Tobacco abuse 02/13/2013  . Other and unspecified hyperlipidemia 02/13/2013  . Allergy   . Hypertension   . Anxiety state 05/25/2009  . Essential hypertension 05/25/2009  . DEGENERATIVE JOINT DISEASE 05/25/2009   Ailene Ravel, OTR/L,CBIS  [phone]  09/09/2018, 2:32 PM  [place] [address] [place], [place], [address] Phone: [phone]   Fax:  [phone]  Name: [name] MRN: [id number] Date of Birth: Apr 01, 1966

## 2018-09-13 ENCOUNTER — Encounter (HOSPITAL_COMMUNITY): Payer: Self-pay

## 2018-09-13 ENCOUNTER — Ambulatory Visit (HOSPITAL_COMMUNITY): Payer: Managed Care, Other (non HMO)

## 2018-09-13 DIAGNOSIS — G8929 Other chronic pain: Secondary | ICD-10-CM

## 2018-09-13 DIAGNOSIS — R29898 Other symptoms and signs involving the musculoskeletal system: Secondary | ICD-10-CM

## 2018-09-13 DIAGNOSIS — M25511 Pain in right shoulder: Secondary | ICD-10-CM

## 2018-09-13 DIAGNOSIS — M25512 Pain in left shoulder: Secondary | ICD-10-CM

## 2018-09-13 DIAGNOSIS — M25612 Stiffness of left shoulder, not elsewhere classified: Secondary | ICD-10-CM

## 2018-09-13 DIAGNOSIS — M25611 Stiffness of right shoulder, not elsewhere classified: Secondary | ICD-10-CM

## 2018-09-13 NOTE — Patient Instructions (Signed)

## 2018-09-13 NOTE — Therapy (Signed)
[place], [place], [address] Phone: [phone]   Fax:  [phone]  Occupational Therapy Treatment  Patient Details  Name: [name] MRN: [id number] Date of Birth: 03-18-1966 Referring Provider (OT): Dr. Justice Britain   Encounter Date: 09/13/2018  OT End of Session - 09/13/18 1422    Visit Number  4    Number of Visits  8    Date for OT Re-Evaluation  09/30/18    Authorization Type  [employer] Managed    Authorization Time Period  no copay. 40 visit limit OT/SLP     Authorization - Visit Number  4    Authorization - Number of Visits  40    OT Start Time  4888    OT Stop Time  1430    OT Time Calculation (min)  38 min    Activity Tolerance  Patient tolerated treatment well    Behavior During Therapy  WFL for tasks assessed/performed       Past Medical History:  Diagnosis Date  . Allergy   . Anemia   . Colonic diverticular abscess 12/26/2014  . Diverticulitis   . ETOH abuse   . GAD (generalized anxiety disorder)   . GERD (gastroesophageal reflux disease)   . Hypercholesteremia   . Hypertension     Past Surgical History:  Procedure Laterality Date  . APPENDECTOMY N/A 01/01/2015   Procedure: INCIDENTAL APPENDECTOMY;  Surgeon: Jamesetta So, MD;  [facility]: [facility];  Service: General;  Laterality: N/A;  . BACK SURGERY     lower back, fusion  . COLONOSCOPY WITH PROPOFOL N/A 12/21/2017   Procedure: COLONOSCOPY WITH PROPOFOL;  Surgeon: Daneil Dolin, MD;  [facility];  Service: Endoscopy;  Laterality: N/A;  9:45am  . GASTRECTOMY N/A 04/14/2016   Procedure: Toniann Ket;  Surgeon: Aviva Signs, MD;  Location: [facility];  Service: General;  Laterality: N/A;  . HEMORRHOID SURGERY    . HERNIA REPAIR     as child  . PARTIAL COLECTOMY N/A 01/01/2015   Procedure: PARTIAL COLECTOMY;  Surgeon: Jamesetta So, MD;  [facility]: [facility];  Service: General;  Laterality: N/A;  . POLYPECTOMY  12/21/2017   Procedure: POLYPECTOMY;  Surgeon: Daneil Dolin, MD;  [facility];  Service: Endoscopy;;  colon  . ROTATOR CUFF REPAIR Bilateral     There were no vitals filed for this visit.  Subjective Assessment - 09/13/18 1410    Subjective   S: I think the arm bike is what made me sore last time.     Currently in Pain?  Yes    Pain Score  4     Pain Location  Shoulder    Pain Orientation  Left    Pain Descriptors / Indicators  Aching    Pain Type  Chronic pain         [facility] OT Assessment - 09/13/18 1410      Assessment   Medical Diagnosis  bilateral shoulder pain      Precautions   Precautions  None               OT Treatments/Exercises (OP) - 09/13/18 1410      Exercises   Exercises  Shoulder      Shoulder Exercises: Supine   Protraction  PROM;5 reps;Both    Horizontal ABduction  PROM;5 reps;Both    External Rotation  PROM;5 reps;Both    Internal Rotation  PROM;5 reps;Both  Flexion  PROM;5 reps;Both    ABduction  PROM;5 reps;Both      Shoulder Exercises: Standing   Protraction  AROM;12 reps    Horizontal ABduction  AROM;12 reps    External Rotation  AROM;12 reps    Internal Rotation  AROM;12 reps    Flexion  AROM;12 reps    ABduction  AROM;12 reps    Extension  Theraband;12 reps    Theraband Level (Shoulder Extension)  Level 2 (Red)    Row  Theraband;12 reps    Theraband Level (Shoulder Row)  Level 2 (Red)    Retraction  Theraband;12 reps    Theraband Level (Shoulder Retraction)  Level 2 (Red)      Shoulder Exercises: Therapy Ball   Other Therapy Ball Exercises  Green ball: chest press, flexion, circles      Shoulder Exercises: ROM/Strengthening   UBE (Upper Arm Bike)  Level 2 3' reverse only   pace: 3.5-4.0   X to V Arms  10X A/ROM    Proximal Shoulder Strengthening, Seated  10X A/ROM 1 rest break    Other ROM/Strengthening Exercises  Y arms lift off. 10X      Manual Therapy   Manual Therapy  Soft tissue mobilization    Manual therapy  comments  Manual therapy completed prior to exercises.     Soft tissue mobilization  Myofascial release and manual stretching completed to BUE upper arm, trapezius, and scapularis region.             OT Short Term Goals - 09/07/18 1501      OT SHORT TERM GOAL #1   Title  Patient will be educated and independent with his HEP in order to faciliate his progress in therapy and increase functional use of his BUEs during all daily and work related tasks.     Time  4    Period  Weeks    Status  On-going      OT SHORT TERM GOAL #2   Title  Patient will increase BUE A/ROM by 10 degrees or more where needed in order to be able to complete functional reaching tasks with less discomfort.     Time  4    Period  Weeks    Status  On-going      OT SHORT TERM GOAL #3   Title  Patient will report a decrease in pain level in BUE when completing daily and work tasks of approximately 4/10 or less.     Time  4    Period  Weeks    Status  On-going      OT SHORT TERM GOAL #4   Title  Patient will decrease fascial restrictions in BUE to min amount or less in order to increase functional mobility needed for reaching tasks.     Time  4    Period  Weeks    Status  On-going      OT SHORT TERM GOAL #5   Title  Patient will increase Bilateral scapular strength by increasing overall posture while being able to demonstrate proper lifting techniques that will be required at work.     Time  4    Period  Weeks    Status  On-going               Plan - 09/13/18 1422    Clinical Impression Statement  A: Increased repetitions for A/ROM to 12X. No muscle tightness noted during passive stretching. Manual techniques completed to address fascial  restrictions in bilateral UEs. VC for form and technique.     Plan  P: D/C passive stretching. Add prone hughston exercises is able to tolerate.     Consulted and Agree with Plan of Care  Patient       Patient will benefit from skilled therapeutic  intervention in order to improve the following deficits and impairments:  Pain, Decreased range of motion, Impaired UE functional use, Increased fascial restrictions  Visit Diagnosis: Other symptoms and signs involving the musculoskeletal system  Chronic left shoulder pain  Chronic right shoulder pain  Stiffness of right shoulder, not elsewhere classified  Stiffness of left shoulder, not elsewhere classified    Problem List Patient Active Problem List   Diagnosis Date Noted  . Heme + stool 09/17/2017  . Hemorrhoids 09/17/2017  . Peptic ulcer, acute with perforation ([place]) 04/14/2016  . Disorder of appendix 12/31/2014  . Diverticulitis of colon with perforation 12/30/2014  . Colonic diverticular abscess 12/26/2014  . ETOH abuse 12/26/2014  . Acute colitis 12/08/2014  . Hyperglycemia 12/08/2014  . Obesity 12/08/2014  . Colitis 12/08/2014  . GAD (generalized anxiety disorder)   . Atypical chest pain 02/13/2013  . Tobacco abuse 02/13/2013  . Other and unspecified hyperlipidemia 02/13/2013  . Allergy   . Hypertension   . Anxiety state 05/25/2009  . Essential hypertension 05/25/2009  . DEGENERATIVE JOINT DISEASE 05/25/2009   Ailene Ravel, OTR/L,CBIS  [phone]  09/13/2018, 2:30 PM  [facility] [address] [place], [place], [address] Phone: [phone]   Fax:  [phone]  Name: [name] MRN: [id number] Date of Birth: [birth year]

## 2018-09-15 ENCOUNTER — Telehealth: Payer: Self-pay

## 2018-09-15 NOTE — Telephone Encounter (Signed)
We have attempted to call the patient four times to schedule sleep study.  Patient has been unavailable two times at the phone numbers we have on file.  At this point we will send a letter asking patient to please contact the sleep lab to schedule their sleep study.  If patient calls back we will schedule them for their sleep study.

## 2018-09-16 ENCOUNTER — Ambulatory Visit (HOSPITAL_COMMUNITY): Payer: Managed Care, Other (non HMO)

## 2018-09-16 ENCOUNTER — Encounter (HOSPITAL_COMMUNITY): Payer: Self-pay

## 2018-09-16 DIAGNOSIS — M25512 Pain in left shoulder: Secondary | ICD-10-CM

## 2018-09-16 DIAGNOSIS — G8929 Other chronic pain: Secondary | ICD-10-CM

## 2018-09-16 DIAGNOSIS — R29898 Other symptoms and signs involving the musculoskeletal system: Secondary | ICD-10-CM | POA: Diagnosis not present

## 2018-09-16 DIAGNOSIS — M25611 Stiffness of right shoulder, not elsewhere classified: Secondary | ICD-10-CM

## 2018-09-16 DIAGNOSIS — M25612 Stiffness of left shoulder, not elsewhere classified: Secondary | ICD-10-CM

## 2018-09-16 DIAGNOSIS — M25511 Pain in right shoulder: Secondary | ICD-10-CM

## 2018-09-16 NOTE — Therapy (Signed)
[place], [place], [address] Phone: [phone]   Fax:  [phone]  Occupational Therapy Treatment  Patient Details  Name: [name] MRN: [id number] Date of Birth: [birth year] Referring Provider (OT): Dr. Justice Britain   Encounter Date: 09/16/2018  OT End of Session - 09/16/18 1437    Visit Number  5    Number of Visits  8    Date for OT Re-Evaluation  09/30/18    Authorization Type  [employer] Managed    Authorization Time Period  no copay. 40 visit limit OT/SLP     Authorization - Visit Number  5    Authorization - Number of Visits  40    OT Start Time  1350    OT Stop Time  1430    OT Time Calculation (min)  40 min    Activity Tolerance  Patient tolerated treatment well    Behavior During Therapy  WFL for tasks assessed/performed       Past Medical History:  Diagnosis Date  . Allergy   . Anemia   . Colonic diverticular abscess 12/26/2014  . Diverticulitis   . ETOH abuse   . GAD (generalized anxiety disorder)   . GERD (gastroesophageal reflux disease)   . Hypercholesteremia   . Hypertension     Past Surgical History:  Procedure Laterality Date  . APPENDECTOMY N/A 01/01/2015   Procedure: INCIDENTAL APPENDECTOMY;  Surgeon: Jamesetta So, MD;  Location[facility];  Service: General;  Laterality: N/A;  . BACK SURGERY     lower back, fusion  . COLONOSCOPY WITH PROPOFOL N/A 12/21/2017   Procedure: COLONOSCOPY WITH PROPOFOL;  Surgeon: Daneil Dolin, MD;  [facility];  Service: Endoscopy;  Laterality: N/A;  9:45am  . GASTRECTOMY N/A 04/14/2016   Procedure: Toniann Ket;  Surgeon: Aviva Signs, MD;  Location: [facility];  Service: General;  Laterality: N/A;  . HEMORRHOID SURGERY    . HERNIA REPAIR     as child  . PARTIAL COLECTOMY N/A 01/01/2015   Procedure: PARTIAL COLECTOMY;  Surgeon: Jamesetta So, MD;  Location[facility];  Service: General;  Laterality: N/A;  . POLYPECTOMY  12/21/2017   Procedure: POLYPECTOMY;  Surgeon: Daneil Dolin, MD;  [facility];  Service: Endoscopy;;  colon  . ROTATOR CUFF REPAIR Bilateral     There were no vitals filed for this visit.  Subjective Assessment - 09/16/18 1414    Currently in Pain?  Yes    Pain Score  6     Pain Location  Shoulder    Pain Orientation  Left    Pain Descriptors / Indicators  Sore    Pain Type  Chronic pain    Pain Radiating Towards  N/A    Pain Onset  More than a month ago    Pain Frequency  Constant    Aggravating Factors   movement and cold air    Pain Relieving Factors  rest, ice    Effect of Pain on Daily Activities  min effect    Multiple Pain Sites  No         [facility] OT Assessment - 09/16/18 1415      Assessment   Medical Diagnosis  bilateral shoulder pain      Precautions   Precautions  None               OT Treatments/Exercises (OP) - 09/16/18 1415      Exercises  Exercises  Shoulder      Shoulder Exercises: Prone   Other Prone Exercises  [place] H1, H3, H4 without the scapular squeeze; 10X      Shoulder Exercises: Therapy Ball   Other Therapy Ball Exercises  Green ball: chest press, flexion, diagonals 10X      Shoulder Exercises: ROM/Strengthening   X to V Arms  10X A/ROM    Proximal Shoulder Strengthening, Seated  10X A/ROM 1 rest break    Other ROM/Strengthening Exercises  Y arms lift off. 12X      Manual Therapy   Manual Therapy  Soft tissue mobilization    Manual therapy comments  Manual therapy completed prior to exercises.     Soft tissue mobilization  Myofascial release completed to BUE upper arm, trapezius, and scapularis region.               OT Short Term Goals - 09/07/18 1501      OT SHORT TERM GOAL #1   Title  Patient will be educated and independent with his HEP in order to faciliate his progress in therapy and increase functional use of his BUEs during all daily and work related tasks.     Time  4    Period  Weeks    Status   On-going      OT SHORT TERM GOAL #2   Title  Patient will increase BUE A/ROM by 10 degrees or more where needed in order to be able to complete functional reaching tasks with less discomfort.     Time  4    Period  Weeks    Status  On-going      OT SHORT TERM GOAL #3   Title  Patient will report a decrease in pain level in BUE when completing daily and work tasks of approximately 4/10 or less.     Time  4    Period  Weeks    Status  On-going      OT SHORT TERM GOAL #4   Title  Patient will decrease fascial restrictions in BUE to min amount or less in order to increase functional mobility needed for reaching tasks.     Time  4    Period  Weeks    Status  On-going      OT SHORT TERM GOAL #5   Title  Patient will increase Bilateral scapular strength by increasing overall posture while being able to demonstrate proper lifting techniques that will be required at work.     Time  4    Period  Weeks    Status  On-going               Plan - 09/16/18 1438    Clinical Impression Statement  A: pt continues to have increased fascial restrictions bilaterally. He has full ROM although reports increased soreness in BUEs with all movements. Focused on shoulder and scapular mobility and strengthening activities.     Plan  P: D/C passive stretching. Focus on golf swings/movements using the PVC pipe to warm up then use the body craft pulleys.       Patient will benefit from skilled therapeutic intervention in order to improve the following deficits and impairments:  Pain, Decreased range of motion, Impaired UE functional use, Increased fascial restrictions  Visit Diagnosis: Other symptoms and signs involving the musculoskeletal system  Chronic left shoulder pain  Chronic right shoulder pain  Stiffness of right shoulder, not elsewhere classified  Stiffness of left shoulder,  not elsewhere classified    Problem List Patient Active Problem List   Diagnosis Date Noted  . Heme +  stool 09/17/2017  . Hemorrhoids 09/17/2017  . Peptic ulcer, acute with perforation ([place]) 04/14/2016  . Disorder of appendix 12/31/2014  . Diverticulitis of colon with perforation 12/30/2014  . Colonic diverticular abscess 12/26/2014  . ETOH abuse 12/26/2014  . Acute colitis 12/08/2014  . Hyperglycemia 12/08/2014  . Obesity 12/08/2014  . Colitis 12/08/2014  . GAD (generalized anxiety disorder)   . Atypical chest pain 02/13/2013  . Tobacco abuse 02/13/2013  . Other and unspecified hyperlipidemia 02/13/2013  . Allergy   . Hypertension   . Anxiety state 05/25/2009  . Essential hypertension 05/25/2009  . DEGENERATIVE JOINT DISEASE 05/25/2009   Ailene Ravel, OTR/L,CBIS  [phone]  09/16/2018, 2:40 PM  [facility] [address] [place], [place], [address] Phone: [phone]   Fax:  [phone]  Name: [name] MRN: [id number] Date of Birth: 12-06-1965

## 2018-09-21 ENCOUNTER — Encounter (HOSPITAL_COMMUNITY): Payer: Self-pay

## 2018-09-21 ENCOUNTER — Ambulatory Visit (HOSPITAL_COMMUNITY): Payer: Managed Care, Other (non HMO)

## 2018-09-21 DIAGNOSIS — G8929 Other chronic pain: Secondary | ICD-10-CM

## 2018-09-21 DIAGNOSIS — M25612 Stiffness of left shoulder, not elsewhere classified: Secondary | ICD-10-CM

## 2018-09-21 DIAGNOSIS — R29898 Other symptoms and signs involving the musculoskeletal system: Secondary | ICD-10-CM | POA: Diagnosis not present

## 2018-09-21 DIAGNOSIS — M25512 Pain in left shoulder: Secondary | ICD-10-CM

## 2018-09-21 DIAGNOSIS — M25511 Pain in right shoulder: Secondary | ICD-10-CM

## 2018-09-21 DIAGNOSIS — M25611 Stiffness of right shoulder, not elsewhere classified: Secondary | ICD-10-CM

## 2018-09-21 NOTE — Patient Instructions (Signed)
Doorway External Rotation Stretch - Hold 30 seconds-1 minute. Complete 1-2 times. Once a day or prior to playing golf.   Standing in an open doorway, place your arm on the edge of the doorway with the shoulder at 90 degrees from the side and elbow bent to 90 degrees. Lean forward until you feel a stretch on the front of your shoulder. Keep neck relaxed.

## 2018-09-21 NOTE — Therapy (Signed)
[place], [place], [address] Phone: [phone]   Fax:  [phone]  Occupational Therapy Treatment  Patient Details  Name: [name] MRN: [id number] Date of Birth: [birth year] Referring Provider (OT): Dr. Justice Britain   Encounter Date: 09/21/2018  OT End of Session - 09/21/18 1518    Visit Number  6    Number of Visits  8    Date for OT Re-Evaluation  09/30/18    Authorization Type  [employer] Managed    Authorization Time Period  no copay. 40 visit limit OT/SLP     Authorization - Visit Number  6    Authorization - Number of Visits  40    OT Start Time  1430    OT Stop Time  1515    OT Time Calculation (min)  45 min    Activity Tolerance  Patient tolerated treatment well    Behavior During Therapy  WFL for tasks assessed/performed       Past Medical History:  Diagnosis Date  . Allergy   . Anemia   . Colonic diverticular abscess 12/26/2014  . Diverticulitis   . ETOH abuse   . GAD (generalized anxiety disorder)   . GERD (gastroesophageal reflux disease)   . Hypercholesteremia   . Hypertension     Past Surgical History:  Procedure Laterality Date  . APPENDECTOMY N/A 01/01/2015   Procedure: INCIDENTAL APPENDECTOMY;  Surgeon: Jamesetta So, MD;  Location: [facility];  Service: General;  Laterality: N/A;  . BACK SURGERY     lower back, fusion  . COLONOSCOPY WITH PROPOFOL N/A 12/21/2017   Procedure: COLONOSCOPY WITH PROPOFOL;  Surgeon: Daneil Dolin, MD;  [facility];  Service: Endoscopy;  Laterality: N/A;  9:45am  . GASTRECTOMY N/A 04/14/2016   Procedure: Toniann Ket;  Surgeon: Aviva Signs, MD;  Location: [facility];  Service: General;  Laterality: N/A;  . HEMORRHOID SURGERY    . HERNIA REPAIR     as child  . PARTIAL COLECTOMY N/A 01/01/2015   Procedure: PARTIAL COLECTOMY;  Surgeon: Jamesetta So, MD;  Location: [facility];  Service: General;  Laterality: N/A;  . POLYPECTOMY  12/21/2017   Procedure: POLYPECTOMY;  Surgeon: Daneil Dolin, MD;  [facility];  Service: Endoscopy;;  colon  . ROTATOR CUFF REPAIR Bilateral     There were no vitals filed for this visit.  Subjective Assessment - 09/21/18 1447    Subjective   S: I want to be able to able to return to golfing.    Currently in Pain?  Yes    Pain Score  7     Pain Location  Shoulder    Pain Orientation  Left    Pain Descriptors / Indicators  Sore    Pain Type  Chronic pain         [facility] OT Assessment - 09/21/18 1459      Assessment   Medical Diagnosis  bilateral shoulder pain      Precautions   Precautions  None               OT Treatments/Exercises (OP) - 09/21/18 1451      Exercises   Exercises  Shoulder      Shoulder Exercises: Standing   Extension  Theraband;15 reps    Theraband Level (Shoulder Extension)  Level 2 (Red)    Row  Theraband;15 reps    Theraband Level (Shoulder Row)  Level 2 (Red)  Retraction  Theraband;15 reps    Theraband Level (Shoulder Retraction)  Level 2 (Red)      Shoulder Exercises: Therapy Ball   Other Therapy Ball Exercises  Green ball: chest press, flexion, diagonals 10X      Shoulder Exercises: ROM/Strengthening   UBE (Upper Arm Bike)  Level 2 3' reverse only   pace: 4.0-4.5   Other ROM/Strengthening Exercises  PVC pipe used to practice golf swing movement for 1' at a slow controlled pace.    Other ROM/Strengthening Exercises  Body craft pulley PNF diagonal patterns used with each arm individually to mimic golf swing. 10#, 10X each      Manual Therapy   Manual Therapy  Soft tissue mobilization    Manual therapy comments  Manual therapy completed prior to exercises.     Soft tissue mobilization  Myofascial release completed to BUE upper arm, trapezius, and scapularis region.             OT Education - 09/21/18 1504    Education Details  external rotation stretch at wall     Person(s) Educated  Patient    Methods   Explanation;Demonstration;Handout;Verbal cues    Comprehension  Verbalized understanding;Returned demonstration       OT Short Term Goals - 09/07/18 1501      OT SHORT TERM GOAL #1   Title  Patient will be educated and independent with his HEP in order to faciliate his progress in therapy and increase functional use of his BUEs during all daily and work related tasks.     Time  4    Period  Weeks    Status  On-going      OT SHORT TERM GOAL #2   Title  Patient will increase BUE A/ROM by 10 degrees or more where needed in order to be able to complete functional reaching tasks with less discomfort.     Time  4    Period  Weeks    Status  On-going      OT SHORT TERM GOAL #3   Title  Patient will report a decrease in pain level in BUE when completing daily and work tasks of approximately 4/10 or less.     Time  4    Period  Weeks    Status  On-going      OT SHORT TERM GOAL #4   Title  Patient will decrease fascial restrictions in BUE to min amount or less in order to increase functional mobility needed for reaching tasks.     Time  4    Period  Weeks    Status  On-going      OT SHORT TERM GOAL #5   Title  Patient will increase Bilateral scapular strength by increasing overall posture while being able to demonstrate proper lifting techniques that will be required at work.     Time  4    Period  Weeks    Status  On-going               Plan - 09/21/18 1519    Clinical Impression Statement  A: Patient completed slow and controlled golf swing using PVC pipe during session. Reports pain in anterior portion of right shoulder at [facility] joint. Pain higher during back swing motion although felt throughout swing. Added External rotation stretch to HEP. VC for form and technique were needed during scapular theraband exercises.     Plan  P: D/C passive stretching. Continue with Manual techniques to decrease  fascial restrictions. Continue with body craft to complete diagonal arm movements  to mimic golf swing. Increase weight slightly to 20# or remain at 10# and increase to 12X    Consulted and Agree with Plan of Care  Patient       Patient will benefit from skilled therapeutic intervention in order to improve the following deficits and impairments:  Pain, Decreased range of motion, Impaired UE functional use, Increased fascial restrictions  Visit Diagnosis: Other symptoms and signs involving the musculoskeletal system  Chronic left shoulder pain  Chronic right shoulder pain  Stiffness of right shoulder, not elsewhere classified  Stiffness of left shoulder, not elsewhere classified    Problem List Patient Active Problem List   Diagnosis Date Noted  . Heme + stool 09/17/2017  . Hemorrhoids 09/17/2017  . Peptic ulcer, acute with perforation ([place]) 04/14/2016  . Disorder of appendix 12/31/2014  . Diverticulitis of colon with perforation 12/30/2014  . Colonic diverticular abscess 12/26/2014  . ETOH abuse 12/26/2014  . Acute colitis 12/08/2014  . Hyperglycemia 12/08/2014  . Obesity 12/08/2014  . Colitis 12/08/2014  . GAD (generalized anxiety disorder)   . Atypical chest pain 02/13/2013  . Tobacco abuse 02/13/2013  . Other and unspecified hyperlipidemia 02/13/2013  . Allergy   . Hypertension   . Anxiety state 05/25/2009  . Essential hypertension 05/25/2009  . DEGENERATIVE JOINT DISEASE 05/25/2009     Ailene Ravel, OTR/L,CBIS  [phone]   09/21/2018, 3:22 PM  [facility] [address] [place], [place], [address] Phone: [phone]   Fax:  [phone]  Name: [name] MRN: [id number] Date of Birth: 06-26-1966

## 2018-09-23 ENCOUNTER — Ambulatory Visit (HOSPITAL_COMMUNITY): Payer: Managed Care, Other (non HMO)

## 2018-09-23 ENCOUNTER — Encounter (HOSPITAL_COMMUNITY): Payer: Self-pay

## 2018-09-23 DIAGNOSIS — G8929 Other chronic pain: Secondary | ICD-10-CM

## 2018-09-23 DIAGNOSIS — M25611 Stiffness of right shoulder, not elsewhere classified: Secondary | ICD-10-CM

## 2018-09-23 DIAGNOSIS — M25511 Pain in right shoulder: Secondary | ICD-10-CM

## 2018-09-23 DIAGNOSIS — R29898 Other symptoms and signs involving the musculoskeletal system: Secondary | ICD-10-CM

## 2018-09-23 DIAGNOSIS — M25512 Pain in left shoulder: Secondary | ICD-10-CM

## 2018-09-23 DIAGNOSIS — M25612 Stiffness of left shoulder, not elsewhere classified: Secondary | ICD-10-CM

## 2018-09-23 NOTE — Therapy (Signed)
[place], [place], [address] Phone: [phone]   Fax:  [phone]  Occupational Therapy Treatment  Patient Details  Name: [name] MRN: [id number] Date of Birth: Jun 27, 1966 Referring Provider (OT): Dr. Justice Britain   Encounter Date: 09/23/2018  OT End of Session - 09/23/18 1437    Visit Number  7    Number of Visits  8    Date for OT Re-Evaluation  09/30/18    Authorization Type  [employer] Managed    Authorization Time Period  no copay. 40 visit limit OT/SLP     Authorization - Visit Number  7    Authorization - Number of Visits  40    Activity Tolerance  Patient tolerated treatment well    Behavior During Therapy  [facility] for tasks assessed/performed       Past Medical History:  Diagnosis Date  . Allergy   . Anemia   . Colonic diverticular abscess 12/26/2014  . Diverticulitis   . ETOH abuse   . GAD (generalized anxiety disorder)   . GERD (gastroesophageal reflux disease)   . Hypercholesteremia   . Hypertension     Past Surgical History:  Procedure Laterality Date  . APPENDECTOMY N/A 01/01/2015   Procedure: INCIDENTAL APPENDECTOMY;  Surgeon: Jamesetta So, MD;  Location: [facility];  Service: General;  Laterality: N/A;  . BACK SURGERY     lower back, fusion  . COLONOSCOPY WITH PROPOFOL N/A 12/21/2017   Procedure: COLONOSCOPY WITH PROPOFOL;  Surgeon: Daneil Dolin, MD;  [facility];  Service: Endoscopy;  Laterality: N/A;  9:45am  . GASTRECTOMY N/A 04/14/2016   Procedure: Toniann Ket;  Surgeon: Aviva Signs, MD;  Location: [facility];  Service: General;  Laterality: N/A;  . HEMORRHOID SURGERY    . HERNIA REPAIR     as child  . PARTIAL COLECTOMY N/A 01/01/2015   Procedure: PARTIAL COLECTOMY;  Surgeon: Jamesetta So, MD;  Location: [facility];  Service: General;  Laterality: N/A;  . POLYPECTOMY  12/21/2017   Procedure: POLYPECTOMY;  Surgeon: Daneil Dolin, MD;  [facility];  Service:  Endoscopy;;  colon  . ROTATOR CUFF REPAIR Bilateral     There were no vitals filed for this visit.  Subjective Assessment - 09/23/18 1418    Subjective   S: I practiced some gold swings yesterday.     Currently in Pain?  Yes    Pain Score  8     Pain Location  Shoulder    Pain Orientation  Left    Pain Descriptors / Indicators  Sore    Pain Type  Chronic pain    Pain Radiating Towards  N/A    Pain Onset  More than a month ago    Pain Frequency  Constant    Aggravating Factors   movement and cold air    Pain Relieving Factors  rest, ice    Effect of Pain on Daily Activities  min effect    Multiple Pain Sites  No         [facility] OT Assessment - 09/23/18 1419      Assessment   Medical Diagnosis  bilateral shoulder pain      Precautions   Precautions  None               OT Treatments/Exercises (OP) - 09/23/18 1419      Exercises   Exercises  Shoulder      Shoulder  Exercises: ROM/Strengthening   UBE (Upper Arm Bike)  Level 2 3' reverse only   pace: 4.5   Other ROM/Strengthening Exercises  Bodycraft: external rotation, retraction, flexion, extension, PNF/diagonals to mimic golf swing; each arm; 10X; 20#      Manual Therapy   Manual Therapy  Soft tissue mobilization    Manual therapy comments  Manual therapy completed prior to exercises.     Soft tissue mobilization  Myofascial release completed to BUE upper arm, trapezius, and scapularis region.               OT Short Term Goals - 09/07/18 1501      OT SHORT TERM GOAL #1   Title  Patient will be educated and independent with his HEP in order to faciliate his progress in therapy and increase functional use of his BUEs during all daily and work related tasks.     Time  4    Period  Weeks    Status  On-going      OT SHORT TERM GOAL #2   Title  Patient will increase BUE A/ROM by 10 degrees or more where needed in order to be able to complete functional reaching tasks with less discomfort.     Time  4     Period  Weeks    Status  On-going      OT SHORT TERM GOAL #3   Title  Patient will report a decrease in pain level in BUE when completing daily and work tasks of approximately 4/10 or less.     Time  4    Period  Weeks    Status  On-going      OT SHORT TERM GOAL #4   Title  Patient will decrease fascial restrictions in BUE to min amount or less in order to increase functional mobility needed for reaching tasks.     Time  4    Period  Weeks    Status  On-going      OT SHORT TERM GOAL #5   Title  Patient will increase Bilateral scapular strength by increasing overall posture while being able to demonstrate proper lifting techniques that will be required at work.     Time  4    Period  Weeks    Status  On-going               Plan - 09/23/18 1438    Clinical Impression Statement  A: Patient was able to increase bodycraft to 20# for shoulder and scapular strengthening. VC for form and technique. Pt reports soreness during session although was able to complete all exercises with rest breaks as needed.     Plan  P: D/C passive stretching. Continue with bodycraft. Prepare for possible upcoming discharge.     Consulted and Agree with Plan of Care  Patient       Patient will benefit from skilled therapeutic intervention in order to improve the following deficits and impairments:  Pain, Decreased range of motion, Impaired UE functional use, Increased fascial restrictions  Visit Diagnosis: Other symptoms and signs involving the musculoskeletal system  Chronic left shoulder pain  Chronic right shoulder pain  Stiffness of right shoulder, not elsewhere classified  Stiffness of left shoulder, not elsewhere classified    Problem List Patient Active Problem List   Diagnosis Date Noted  . Heme + stool 09/17/2017  . Hemorrhoids 09/17/2017  . Peptic ulcer, acute with perforation ([place]) 04/14/2016  . Disorder of appendix 12/31/2014  .  Diverticulitis of colon with perforation  12/30/2014  . Colonic diverticular abscess 12/26/2014  . ETOH abuse 12/26/2014  . Acute colitis 12/08/2014  . Hyperglycemia 12/08/2014  . Obesity 12/08/2014  . Colitis 12/08/2014  . GAD (generalized anxiety disorder)   . Atypical chest pain 02/13/2013  . Tobacco abuse 02/13/2013  . Other and unspecified hyperlipidemia 02/13/2013  . Allergy   . Hypertension   . Anxiety state 05/25/2009  . Essential hypertension 05/25/2009  . DEGENERATIVE JOINT DISEASE 05/25/2009   Ailene Ravel, OTR/L,CBIS  [phone]  09/23/2018, 2:45 PM  [facility] [address] [place], [place], [address] Phone: [phone]   Fax:  [phone]  Name: [name] MRN: [id number] Date of Birth: 1966/04/23

## 2018-09-27 ENCOUNTER — Ambulatory Visit (HOSPITAL_COMMUNITY): Payer: Managed Care, Other (non HMO) | Attending: Orthopedic Surgery

## 2018-09-27 ENCOUNTER — Encounter (HOSPITAL_COMMUNITY): Payer: Self-pay

## 2018-09-27 DIAGNOSIS — M25511 Pain in right shoulder: Secondary | ICD-10-CM | POA: Diagnosis present

## 2018-09-27 DIAGNOSIS — G8929 Other chronic pain: Secondary | ICD-10-CM | POA: Insufficient documentation

## 2018-09-27 DIAGNOSIS — M25512 Pain in left shoulder: Secondary | ICD-10-CM | POA: Insufficient documentation

## 2018-09-27 DIAGNOSIS — R29898 Other symptoms and signs involving the musculoskeletal system: Secondary | ICD-10-CM | POA: Diagnosis present

## 2018-09-27 DIAGNOSIS — M25612 Stiffness of left shoulder, not elsewhere classified: Secondary | ICD-10-CM | POA: Insufficient documentation

## 2018-09-27 DIAGNOSIS — M25611 Stiffness of right shoulder, not elsewhere classified: Secondary | ICD-10-CM | POA: Diagnosis present

## 2018-09-27 NOTE — Therapy (Signed)
[place], [place], [address] Phone: [phone]   Fax:  [phone]  Occupational Therapy Treatment  Patient Details  Name: [name] MRN: [id number] Date of Birth: March 22, 1966 Referring Provider (OT): Dr. Justice Britain   Encounter Date: 09/27/2018  OT End of Session - 09/27/18 1403    Visit Number  8    Number of Visits  8    Date for OT Re-Evaluation  09/30/18    Authorization Type  [employer] Managed    Authorization Time Period  no copay. 40 visit limit OT/SLP     Authorization - Visit Number  8    Authorization - Number of Visits  40    OT Start Time  1350    OT Stop Time  1430    OT Time Calculation (min)  40 min    Activity Tolerance  Patient tolerated treatment well    Behavior During Therapy  WFL for tasks assessed/performed       Past Medical History:  Diagnosis Date  . Allergy   . Anemia   . Colonic diverticular abscess 12/26/2014  . Diverticulitis   . ETOH abuse   . GAD (generalized anxiety disorder)   . GERD (gastroesophageal reflux disease)   . Hypercholesteremia   . Hypertension     Past Surgical History:  Procedure Laterality Date  . APPENDECTOMY N/A 01/01/2015   Procedure: INCIDENTAL APPENDECTOMY;  Surgeon: Jamesetta So, MD;  Location: [facility];  Service: General;  Laterality: N/A;  . BACK SURGERY     lower back, fusion  . COLONOSCOPY WITH PROPOFOL N/A 12/21/2017   Procedure: COLONOSCOPY WITH PROPOFOL;  Surgeon: Daneil Dolin, MD;  [facility];  Service: Endoscopy;  Laterality: N/A;  9:45am  . GASTRECTOMY N/A 04/14/2016   Procedure: Toniann Ket;  Surgeon: Aviva Signs, MD;  Location: [facility];  Service: General;  Laterality: N/A;  . HEMORRHOID SURGERY    . HERNIA REPAIR     as child  . PARTIAL COLECTOMY N/A 01/01/2015   Procedure: PARTIAL COLECTOMY;  Surgeon: Jamesetta So, MD;  Location: [facility];  Service: General;  Laterality: N/A;  . POLYPECTOMY  12/21/2017    Procedure: POLYPECTOMY;  Surgeon: Daneil Dolin, MD;  [facility];  Service: Endoscopy;;  colon  . ROTATOR CUFF REPAIR Bilateral     There were no vitals filed for this visit.  Subjective Assessment - 09/27/18 1402    Subjective   S: I'm not as sore as I was last week.     Currently in Pain?  Yes    Pain Score  4     Pain Location  Shoulder    Pain Orientation  Left    Pain Descriptors / Indicators  Sore    Pain Type  Chronic pain         [facility] OT Assessment - 09/27/18 1402      Assessment   Medical Diagnosis  bilateral shoulder pain      Precautions   Precautions  None               OT Treatments/Exercises (OP) - 09/27/18 1402      Exercises   Exercises  Shoulder      Shoulder Exercises: Prone   Other Prone Exercises  [place] H1, H3, H4 without the scapular squeeze; 10X      Shoulder Exercises: Therapy Ball   Other Therapy Ball Exercises  Green ball: 12X: flexion,  chest press, circles.      Shoulder Exercises: ROM/Strengthening   UBE (Upper Arm Bike)  Level 2 3' reverse only   pace: 4.5   [place] Press  --   10X; 20#   [place] Row  --   10X; 20#   Other ROM/Strengthening Exercises  Bodycraft pulleys: external rotation (R:10X, L: 12X), retraction (12X), extension (12X), PNF/diagonal pattern to mimic golf swing (12X each arm), flexion (12X) 20#      Manual Therapy   Manual Therapy  Soft tissue mobilization    Manual therapy comments  Manual therapy completed prior to exercises.     Soft tissue mobilization  Myofascial release completed to BUE upper arm, trapezius, and scapularis region.               OT Short Term Goals - 09/07/18 1501      OT SHORT TERM GOAL #1   Title  Patient will be educated and independent with his HEP in order to faciliate his progress in therapy and increase functional use of his BUEs during all daily and work related tasks.     Time  4    Period  Weeks    Status  On-going      OT SHORT TERM GOAL #2    Title  Patient will increase BUE A/ROM by 10 degrees or more where needed in order to be able to complete functional reaching tasks with less discomfort.     Time  4    Period  Weeks    Status  On-going      OT SHORT TERM GOAL #3   Title  Patient will report a decrease in pain level in BUE when completing daily and work tasks of approximately 4/10 or less.     Time  4    Period  Weeks    Status  On-going      OT SHORT TERM GOAL #4   Title  Patient will decrease fascial restrictions in BUE to min amount or less in order to increase functional mobility needed for reaching tasks.     Time  4    Period  Weeks    Status  On-going      OT SHORT TERM GOAL #5   Title  Patient will increase Bilateral scapular strength by increasing overall posture while being able to demonstrate proper lifting techniques that will be required at work.     Time  4    Period  Weeks    Status  On-going               Plan - 09/27/18 1403    Clinical Impression Statement  A: Increased repetitions for bodycraft pulleys to 12 repetitions to continue working on shoulder and scapular strengthening and stability. VC for form and technique needed occassionally although patient completes all exercises with overall good form. Some pain noted during portion of exercises. Pt will decreased fascial restrictions in left shoulder this session.     Plan  P: Reassess for probably discharge with HEP.     Consulted and Agree with Plan of Care  Patient       Patient will benefit from skilled therapeutic intervention in order to improve the following deficits and impairments:  Pain, Decreased range of motion, Impaired UE functional use, Increased fascial restrictions  Visit Diagnosis: Other symptoms and signs involving the musculoskeletal system  Chronic left shoulder pain  Chronic right shoulder pain  Stiffness of right shoulder, not elsewhere classified  Stiffness of left shoulder, not elsewhere  classified    Problem List Patient Active Problem List   Diagnosis Date Noted  . Heme + stool 09/17/2017  . Hemorrhoids 09/17/2017  . Peptic ulcer, acute with perforation ([place]) 04/14/2016  . Disorder of appendix 12/31/2014  . Diverticulitis of colon with perforation 12/30/2014  . Colonic diverticular abscess 12/26/2014  . ETOH abuse 12/26/2014  . Acute colitis 12/08/2014  . Hyperglycemia 12/08/2014  . Obesity 12/08/2014  . Colitis 12/08/2014  . GAD (generalized anxiety disorder)   . Atypical chest pain 02/13/2013  . Tobacco abuse 02/13/2013  . Other and unspecified hyperlipidemia 02/13/2013  . Allergy   . Hypertension   . Anxiety state 05/25/2009  . Essential hypertension 05/25/2009  . DEGENERATIVE JOINT DISEASE 05/25/2009   Ailene Ravel, OTR/L,CBIS  [phone]  09/27/2018, 2:32 PM  [facility] [address] [place], [place], [address] Phone: [phone]   Fax:  [phone]  Name: [name] MRN: [id number] Date of Birth: March 07, 1966

## 2018-09-30 ENCOUNTER — Ambulatory Visit (HOSPITAL_COMMUNITY): Payer: Managed Care, Other (non HMO)

## 2018-09-30 ENCOUNTER — Encounter (HOSPITAL_COMMUNITY): Payer: Self-pay

## 2018-09-30 DIAGNOSIS — R29898 Other symptoms and signs involving the musculoskeletal system: Secondary | ICD-10-CM

## 2018-09-30 DIAGNOSIS — M25511 Pain in right shoulder: Secondary | ICD-10-CM

## 2018-09-30 DIAGNOSIS — M25612 Stiffness of left shoulder, not elsewhere classified: Secondary | ICD-10-CM

## 2018-09-30 DIAGNOSIS — M25611 Stiffness of right shoulder, not elsewhere classified: Secondary | ICD-10-CM

## 2018-09-30 DIAGNOSIS — M25512 Pain in left shoulder: Secondary | ICD-10-CM

## 2018-09-30 DIAGNOSIS — G8929 Other chronic pain: Secondary | ICD-10-CM

## 2018-09-30 NOTE — Therapy (Signed)
[place], [place], [address] Phone: [phone]   Fax:  [phone]  Occupational Therapy Treatment And reassessment and discharge Patient Details  Name: [name] MRN: [id number] Date of Birth: 1966-10-22 Referring Provider (OT): Dr. Justice Britain   Encounter Date: 09/30/2018  OT End of Session - 09/30/18 1605    Visit Number  9    Number of Visits  9    Date for OT Re-Evaluation  --    Authorization Type  [employer] Managed    Authorization Time Period  no copay. 40 visit limit OT/SLP     Authorization - Visit Number  9    Authorization - Number of Visits  76    OT Start Time  1435   reassessment and discharge   OT Stop Time  1500    OT Time Calculation (min)  25 min    Activity Tolerance  Patient tolerated treatment well    Behavior During Therapy  WFL for tasks assessed/performed       Past Medical History:  Diagnosis Date  . Allergy   . Anemia   . Colonic diverticular abscess 12/26/2014  . Diverticulitis   . ETOH abuse   . GAD (generalized anxiety disorder)   . GERD (gastroesophageal reflux disease)   . Hypercholesteremia   . Hypertension     Past Surgical History:  Procedure Laterality Date  . APPENDECTOMY N/A 01/01/2015   Procedure: INCIDENTAL APPENDECTOMY;  Surgeon: Jamesetta So, MD;  Location: [facility];  Service: General;  Laterality: N/A;  . BACK SURGERY     lower back, fusion  . COLONOSCOPY WITH PROPOFOL N/A 12/21/2017   Procedure: COLONOSCOPY WITH PROPOFOL;  Surgeon: Daneil Dolin, MD;  [facility];  Service: Endoscopy;  Laterality: N/A;  9:45am  . GASTRECTOMY N/A 04/14/2016   Procedure: Toniann Ket;  Surgeon: Aviva Signs, MD;  Location: [facility];  Service: General;  Laterality: N/A;  . HEMORRHOID SURGERY    . HERNIA REPAIR     as child  . PARTIAL COLECTOMY N/A 01/01/2015   Procedure: PARTIAL COLECTOMY;  Surgeon: Jamesetta So, MD;  Location: [facility];  Service: General;   Laterality: N/A;  . POLYPECTOMY  12/21/2017   Procedure: POLYPECTOMY;  Surgeon: Daneil Dolin, MD;  [facility];  Service: Endoscopy;;  colon  . ROTATOR CUFF REPAIR Bilateral     There were no vitals filed for this visit.  Subjective Assessment - 09/30/18 1602    Subjective   S: The ROM and strength had improved in my shoulders.     Currently in Pain?  Yes    Pain Score  5     Pain Location  Shoulder    Pain Orientation  Left    Pain Descriptors / Indicators  Sore    Pain Type  Chronic pain    Pain Radiating Towards  N/A    Pain Onset  More than a month ago    Pain Frequency  Constant    Aggravating Factors   movement and cold air    Pain Relieving Factors  rest, ice pack    Effect of Pain on Daily Activities  min effect    Multiple Pain Sites  No         [facility] OT Assessment - 09/30/18 1437      Assessment   Medical Diagnosis  bilateral shoulder pain      Precautions   Precautions  None  ROM / Strength   AROM / PROM / Strength  AROM;PROM;Strength      AROM   Overall AROM Comments  Assessed seated. IR/er abducted    AROM Assessment Site  Shoulder    Right/Left Shoulder  Left;Right    Right Shoulder Flexion  160 Degrees   previous: 139   Right Shoulder ABduction  160 Degrees   previous: 122   Right Shoulder Internal Rotation  70 Degrees   previous: 65   Right Shoulder External Rotation  90 Degrees   previous: 56   Left Shoulder Flexion  152 Degrees   previous; 136   Left Shoulder ABduction  165 Degrees   previous: 130   Left Shoulder Internal Rotation  75 Degrees   previous: 40   Left Shoulder External Rotation  90 Degrees   previous: 62     PROM   Overall PROM Comments  Assessed supine. IR/er abducted.    PROM Assessment Site  Shoulder    Right/Left Shoulder  Right;Left    Right Shoulder Flexion  180 Degrees   previous: 170   Right Shoulder ABduction  180 Degrees   previous: 170   Right Shoulder Internal Rotation  90 Degrees    previous: same   Right Shoulder External Rotation  90 Degrees   previous: same   Left Shoulder Flexion  180 Degrees   preivous: 170   Left Shoulder ABduction  180 Degrees   previous: same   Left Shoulder Internal Rotation  90 Degrees   previous: same   Left Shoulder External Rotation  90 Degrees   previous: same     Strength   Overall Strength Comments  Assessed seated. IR/er abducted    Strength Assessment Site  Shoulder    Right/Left Shoulder  Right;Left    Right Shoulder Flexion  5/5   previous: 4-/5   Right Shoulder ABduction  5/5   previous: 4+/5   Right Shoulder Internal Rotation  5/5   previous: 4+/5   Right Shoulder External Rotation  5/5   previous: 4+/5   Left Shoulder Flexion  5/5   preivous: 4-/5   Left Shoulder ABduction  5/5   previous; 4+/5   Left Shoulder Internal Rotation  5/5   previous: 4+/5   Left Shoulder External Rotation  5/5   previous: 4+/5                      OT Education - 09/30/18 1604    Education Details  reviewed goals and progress in the therapy. reviewed HEP and provided patient with green theraband to upgrade when appropriate from red theraband. Pt provided with [employer] 2 week trial.     Person(s) Educated  Patient    Methods  Explanation    Comprehension  Verbalized understanding       OT Short Term Goals - 09/30/18 1448      OT SHORT TERM GOAL #1   Title  Patient will be educated and independent with his HEP in order to faciliate his progress in therapy and increase functional use of his BUEs during all daily and work related tasks.     Time  4    Period  Weeks    Status  Achieved      OT SHORT TERM GOAL #2   Title  Patient will increase BUE A/ROM by 10 degrees or more where needed in order to be able to complete functional reaching tasks with less discomfort.  Time  4    Period  Weeks    Status  Achieved      OT SHORT TERM GOAL #3   Title  Patient will report a decrease in pain level in BUE when  completing daily and work tasks of approximately 4/10 or less.     Time  4    Period  Weeks    Status  Partially Met      OT SHORT TERM GOAL #4   Title  Patient will decrease fascial restrictions in BUE to min amount or less in order to increase functional mobility needed for reaching tasks.     Time  4    Period  Weeks    Status  Achieved      OT SHORT TERM GOAL #5   Title  Patient will increase Bilateral scapular strength by increasing overall posture while being able to demonstrate proper lifting techniques that will be required at work.     Time  4    Period  Weeks    Status  Achieved               Plan - 09/30/18 1606    Clinical Impression Statement  A: Reassessment completed this date. patient has met all therapy goals expect pain goal. Pt states that his pain level remained on average at 4 or 5 out of ten on average. Patient states that his ROM and strength have improved since starting therapy. he has not returned to work yet and has a follow up with the MD next week. Discussed refraining from heavy lifting and physically demanding tasks at work due to the amount of degeneration that is in his BUE.  Therapist recommended discharge from therapy with patient continuing HEP at independently. Patient agreed with recommendation.     Plan  P: D/C from therapy with HEP.    Consulted and Agree with Plan of Care  Patient       Patient will benefit from skilled therapeutic intervention in order to improve the following deficits and impairments:  Pain, Decreased range of motion, Impaired UE functional use, Increased fascial restrictions  Visit Diagnosis: Other symptoms and signs involving the musculoskeletal system  Chronic left shoulder pain  Chronic right shoulder pain  Stiffness of right shoulder, not elsewhere classified  Stiffness of left shoulder, not elsewhere classified    Problem List Patient Active Problem List   Diagnosis Date Noted  . Heme + stool  09/17/2017  . Hemorrhoids 09/17/2017  . Peptic ulcer, acute with perforation ([place]) 04/14/2016  . Disorder of appendix 12/31/2014  . Diverticulitis of colon with perforation 12/30/2014  . Colonic diverticular abscess 12/26/2014  . ETOH abuse 12/26/2014  . Acute colitis 12/08/2014  . Hyperglycemia 12/08/2014  . Obesity 12/08/2014  . Colitis 12/08/2014  . GAD (generalized anxiety disorder)   . Atypical chest pain 02/13/2013  . Tobacco abuse 02/13/2013  . Other and unspecified hyperlipidemia 02/13/2013  . Allergy   . Hypertension   . Anxiety state 05/25/2009  . Essential hypertension 05/25/2009  . DEGENERATIVE JOINT DISEASE 05/25/2009   OCCUPATIONAL THERAPY DISCHARGE SUMMARY  Visits from Start of Care: 9  Current functional level related to goals / functional outcomes: See above   Remaining deficits: See above   Education / Equipment: See above Plan: Patient agrees to discharge.  Patient goals were met. Patient is being discharged due to meeting the stated rehab goals.  ?????         Ailene Ravel, OTR/L,CBIS  [phone]  09/30/2018, 4:28 PM  [facility] [address] [place], [place], [address] Phone: [phone]   Fax:  [phone]  Name: [name] MRN: [id number] Date of Birth: [birth year]

## 2018-10-09 ENCOUNTER — Other Ambulatory Visit: Payer: Self-pay | Admitting: Family Medicine

## 2018-11-09 ENCOUNTER — Encounter: Payer: Self-pay | Admitting: Family Medicine

## 2018-11-09 ENCOUNTER — Ambulatory Visit (INDEPENDENT_AMBULATORY_CARE_PROVIDER_SITE_OTHER): Payer: Managed Care, Other (non HMO) | Admitting: Family Medicine

## 2018-11-09 VITALS — BP 112/80 | HR 84 | Temp 98.1°F | Resp 18 | Ht 68.0 in | Wt 203.0 lb

## 2018-11-09 DIAGNOSIS — B9689 Other specified bacterial agents as the cause of diseases classified elsewhere: Secondary | ICD-10-CM

## 2018-11-09 DIAGNOSIS — J019 Acute sinusitis, unspecified: Secondary | ICD-10-CM | POA: Diagnosis not present

## 2018-11-09 MED ORDER — AMOXICILLIN 875 MG PO TABS: 875.0000 mg | ORAL_TABLET | Freq: Two times a day (BID) | ORAL | 0 refills | Status: DC

## 2018-11-09 NOTE — Progress Notes (Signed)
Subjective:    Patient ID: [name], male    DOB: 1966/02/11, 52 y.o.   MRN: [id number]  HPI  Patient states that he is had a head cold for 3 weeks.  He has now subsequently developed pain and pressure in both maxillary sinuses.  He reports blowing thick green occasionally bloody purulent mucus from his nose daily.  He is having copious rhinorrhea.  He is having head congestion and a dull headache.  He reports postnasal drip and drainage which he then has to cough up.  This is also green and thick and sometimes brown.  He reports no shortness of breath, no chest pain, no pleurisy.  He denies any sore throat or otalgia.  He has tried NyQuil, DayQuil, Flonase with no relief Past Medical History:  Diagnosis Date  . Allergy   . Anemia   . Colonic diverticular abscess 12/26/2014  . Diverticulitis   . ETOH abuse   . GAD (generalized anxiety disorder)   . GERD (gastroesophageal reflux disease)   . Hypercholesteremia   . Hypertension    Past Surgical History:  Procedure Laterality Date  . APPENDECTOMY N/A 01/01/2015   Procedure: INCIDENTAL APPENDECTOMY;  Surgeon: Jamesetta So, MD;  Location: [facility];  Service: General;  Laterality: N/A;  . BACK SURGERY     lower back, fusion  . COLONOSCOPY WITH PROPOFOL N/A 12/21/2017   Procedure: COLONOSCOPY WITH PROPOFOL;  Surgeon: Daneil Dolin, MD;  [facility];  Service: Endoscopy;  Laterality: N/A;  9:45am  . GASTRECTOMY N/A 04/14/2016   Procedure: Toniann Ket;  Surgeon: Aviva Signs, MD;  Location: [facility];  Service: General;  Laterality: N/A;  . HEMORRHOID SURGERY    . HERNIA REPAIR     as child  . PARTIAL COLECTOMY N/A 01/01/2015   Procedure: PARTIAL COLECTOMY;  Surgeon: Jamesetta So, MD;  Location: [facility];  Service: General;  Laterality: N/A;  . POLYPECTOMY  12/21/2017   Procedure: POLYPECTOMY;  Surgeon: Daneil Dolin, MD;  [facility];  Service: Endoscopy;;  colon  . ROTATOR CUFF REPAIR Bilateral    Current  Outpatient Medications on File Prior to Visit  Medication Sig Dispense Refill  . acetaminophen (TYLENOL) 500 MG tablet Take 500 mg by mouth every 6 (six) hours as needed for moderate pain or headache.    Marland Kitchen amLODipine (NORVASC) 10 MG tablet TAKE 1 TABLET (10 MG TOTAL) BY MOUTH DAILY. THIS REPLACES METOPROLOL 90 tablet 3  . atorvastatin (LIPITOR) 40 MG tablet TAKE 1 TABLET BY MOUTH EVERY DAY 90 tablet 1  . carvedilol (COREG) 12.5 MG tablet TAKE 1 TABLET (12.5 MG TOTAL) BY MOUTH 2 (TWO) TIMES DAILY WITH A MEAL. 180 tablet 3  . clonazePAM (KLONOPIN) 0.5 MG tablet TAKE 1 TABLET BY MOUTH TWICE A DAY AS NEEDED FOR ANXIETY 30 tablet 2  . cyclobenzaprine (FLEXERIL) 10 MG tablet TAKE 1 TABLET BY MOUTH AT BEDTIME AS NEEDED FOR MUSCLE SPASM 30 tablet 2  . ferrous sulfate 325 (65 FE) MG EC tablet Take 325 mg by mouth daily with breakfast.    . fluticasone (FLONASE) 50 MCG/ACT nasal spray Place 1 spray into both nostrils daily as needed for allergies.   1  . Homeopathic Products (ZICAM COLD REMEDY NA) Place 1 spray into both nostrils daily as needed (for allergies).    . hydrochlorothiazide (MICROZIDE) 12.5 MG capsule TAKE ONE CAPSULE BY MOUTH EVERY DAY 90 capsule 1  . losartan (COZAAR) 50 MG tablet TAKE 1  TABLET BY MOUTH EVERY DAY 90 tablet 1  . pantoprazole (PROTONIX) 40 MG tablet Take 1 tablet (40 mg total) by mouth 2 (two) times daily. 180 tablet 3  . venlafaxine XR (EFFEXOR-XR) 150 MG 24 hr capsule TAKE 1 CAPSULE BY MOUTH IN THE MORNING 90 capsule 3   No current facility-administered medications on file prior to visit.    No Known Allergies Social History   Socioeconomic History  . Marital status: Married    Spouse name: Not on file  . Number of children: Not on file  . Years of education: Not on file  . Highest education level: Not on file  Occupational History  . Not on file  Social Needs  . Financial resource strain: Not on file  . Food insecurity:    Worry: Not on file    Inability: Not  on file  . Transportation needs:    Medical: Not on file    Non-medical: Not on file  Tobacco Use  . Smoking status: Current Every Day Smoker    Packs/day: 1.00    Years: 35.00    Pack years: 35.00    Types: Cigarettes  . Smokeless tobacco: Never Used  Substance and Sexual Activity  . Alcohol use: Yes    Alcohol/week: 0.0 standard drinks    Comment: patient states "about 5 or 6 a day" (beers)  . Drug use: No  . Sexual activity: Yes  Lifestyle  . Physical activity:    Days per week: Not on file    Minutes per session: Not on file  . Stress: Not on file  Relationships  . Social connections:    Talks on phone: Not on file    Gets together: Not on file    Attends religious service: Not on file    Active member of club or organization: Not on file    Attends meetings of clubs or organizations: Not on file    Relationship status: Not on file  . Intimate partner violence:    Fear of current or ex partner: Not on file    Emotionally abused: Not on file    Physically abused: Not on file    Forced sexual activity: Not on file  Other Topics Concern  . Not on file  Social History Narrative  . Not on file     Review of Systems  All other systems reviewed and are negative.      Objective:   Physical Exam Vitals signs reviewed.  Constitutional:      General: He is not in acute distress.    Appearance: Normal appearance. He is not ill-appearing, toxic-appearing or diaphoretic.  HENT:     Right Ear: Tympanic membrane, ear canal and external ear normal.     Left Ear: Tympanic membrane, ear canal and external ear normal.     Nose: Congestion and rhinorrhea present.     Right Turbinates: Swollen.     Left Turbinates: Swollen.     Right Sinus: Maxillary sinus tenderness present.     Left Sinus: Maxillary sinus tenderness present.  Cardiovascular:     Rate and Rhythm: Normal rate and regular rhythm.     Pulses: Normal pulses.     Heart sounds: Normal heart sounds. No murmur.   Pulmonary:     Effort: Pulmonary effort is normal. No respiratory distress.     Breath sounds: Normal breath sounds. No stridor. No wheezing, rhonchi or rales.  Neurological:     Mental Status: He is  alert.           Assessment & Plan:  Acute bacterial rhinosinusitis  I believe the patient has developed a secondary sinus infection.  Begin amoxicillin 875 mg p.o. twice daily for 10 days.  Continue Flonase 2 sprays each nostril daily and recheck in 1 week if no better or sooner if worse.

## 2018-11-11 ENCOUNTER — Ambulatory Visit: Payer: Managed Care, Other (non HMO) | Admitting: Neurology

## 2018-11-15 ENCOUNTER — Other Ambulatory Visit: Payer: Self-pay | Admitting: Family Medicine

## 2019-01-01 ENCOUNTER — Other Ambulatory Visit: Payer: Self-pay | Admitting: Family Medicine

## 2019-01-05 ENCOUNTER — Other Ambulatory Visit: Payer: Self-pay | Admitting: Family Medicine

## 2019-01-06 NOTE — Telephone Encounter (Signed)
Requesting refill  Klonopin  LOV: 11/09/18  LRF:  07/12/18

## 2019-01-27 ENCOUNTER — Other Ambulatory Visit: Payer: Self-pay | Admitting: *Deleted

## 2019-01-27 MED ORDER — CYCLOBENZAPRINE HCL 10 MG PO TABS: 10.0000 mg | ORAL_TABLET | Freq: Every day | ORAL | 2 refills | Status: DC

## 2019-01-27 NOTE — Telephone Encounter (Signed)
Received fax requesting refill on Flexeril.   Ok to refill? 

## 2019-02-08 ENCOUNTER — Other Ambulatory Visit: Payer: Self-pay

## 2019-02-08 ENCOUNTER — Encounter (HOSPITAL_BASED_OUTPATIENT_CLINIC_OR_DEPARTMENT_OTHER): Payer: Self-pay | Admitting: *Deleted

## 2019-02-08 DIAGNOSIS — M75102 Unspecified rotator cuff tear or rupture of left shoulder, not specified as traumatic: Secondary | ICD-10-CM

## 2019-02-08 DIAGNOSIS — S43432A Superior glenoid labrum lesion of left shoulder, initial encounter: Secondary | ICD-10-CM

## 2019-02-08 HISTORY — DX: Superior glenoid labrum lesion of left shoulder, initial encounter: S43.432A

## 2019-02-08 HISTORY — DX: Unspecified rotator cuff tear or rupture of left shoulder, not specified as traumatic: M75.102

## 2019-02-08 NOTE — H&P (Signed)
[name] is an 53 y.o. male.   Chief Complaint: left shoulder rotator cuff tear and distal clavicle HPI:[name] is a 53 year-old seen for significant left shoulder pain and to a lesser degree right shoulder pain for the past year.  Getting progressively worse.  We had arthroscoped his left shoulder twice, the last time in 2013 for partial rotator cuff, partial labrum and biceps tendon tear debridement with subacromial decompression.  We had also arthroscoped his left shoulder in 2010 for partial rotator cuff and labrum debridement as well with subacromial decompression and DCE at that time.  He has done well until the past year and he has had significant pain.  He saw Dr. Onnie [name] in August of 2019 who ordered an MRI at that time that revealed extensive superior labrum tear, as well as chondromalacia, as well as a partial rotator cuff tear and biceps tendon partial tear as well.  He has continued to have significant pain.  Not getting better.  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    Past Medical History:  Diagnosis Date  . Allergy   . Anemia   . Colonic diverticular abscess 12/26/2014  . Diverticulitis   . ETOH abuse   . GAD (generalized anxiety disorder)   . GERD (gastroesophageal reflux disease)   . Hypercholesteremia   . Hypertension     Past Surgical History:  Procedure Laterality Date  . APPENDECTOMY N/A 01/01/2015   Procedure: INCIDENTAL APPENDECTOMY;  Surgeon: Jamesetta So, MD;  Location: [facility];  Service: General;  Laterality: N/A;  . BACK SURGERY     lower back, fusion  . COLONOSCOPY WITH PROPOFOL N/A 12/21/2017   Procedure: COLONOSCOPY WITH PROPOFOL;  Surgeon: Daneil Dolin, MD;  [facility];  Service: Endoscopy;  Laterality: N/A;  9:45am  . GASTRECTOMY N/A 04/14/2016   Procedure: Toniann Ket;  Surgeon: Aviva Signs, MD;  Location: [facility];  Service: General;  Laterality: N/A;  . HEMORRHOID SURGERY    . HERNIA REPAIR      as child  . PARTIAL COLECTOMY N/A 01/01/2015   Procedure: PARTIAL COLECTOMY;  Surgeon: Jamesetta So, MD;  [facility];  Service: General;  Laterality: N/A;  . POLYPECTOMY  12/21/2017   Procedure: POLYPECTOMY;  Surgeon: Daneil Dolin, MD;  [facility];  Service: Endoscopy;;  colon  . ROTATOR CUFF REPAIR Bilateral     Family History  Problem Relation Age of Onset  . Stroke Mother   . CAD Father   . Colon cancer Neg Hx    Social History:  reports that he has been smoking cigarettes. He has a 35.00 pack-year smoking history. He has never used smokeless tobacco. He reports current alcohol use. He reports that he does not use drugs.  Allergies: No Known Allergies  No medications prior to admission.    No results found for this or any previous visit (from the past 48 hour(s)). No results found.  Review of Systems  Constitutional: Negative.   HENT: Negative.   Eyes: Negative.   Respiratory: Negative.   Cardiovascular: Negative.   Gastrointestinal: Negative.   Genitourinary: Negative.   Musculoskeletal: Positive for joint pain.  Skin: Negative.   Neurological: Negative.   Endo/Heme/Allergies: Negative.   Psychiatric/Behavioral: Negative.     Height 5\' 8"  (1.727 Jonathon), weight 90.7 kg. Physical Exam  Constitutional: He is oriented to person, place, and time. He appears well-developed and well-nourished.  HENT:  Head:  Normocephalic and atraumatic.  Eyes: Pupils are equal, round, and reactive to light. Conjunctivae are normal.  Neck: Neck supple.  Cardiovascular: Normal rate.  Respiratory: Effort normal.  GI: Soft.  Genitourinary:    Genitourinary Comments: Not pertinent to current symptomatology therefore not examined.   Musculoskeletal:     Comments: Examination of his left shoulder reveals 80% range of motion with pain.  Mild weakness on rotator cuff stressing.  Positive O'Brien's test.  Examination of his right shoulder reveals full range of motion with  pain.  Mild weakness.  No instability.  Vascular exam: Pulses are 2+ and symmetric.  Neurologic exam: Distal motor and sensory examination is within normal limits.    Neurological: He is alert and oriented to person, place, and time.  Skin: Skin is warm and dry.  Psychiatric: He has a normal mood and affect. His behavior is normal.     Assessment Principal Problem:   Left rotator cuff tear Active Problems:   Anxiety state   Essential hypertension   ETOH abuse   Superior labrum anterior-to-posterior (SLAP) tear of left shoulder   Plan At this point due to failed conservative treatment with his left shoulder, I would recommend that we proceed with left shoulder arthroscopy with rotator cuff tear debridement versus repair with probable biceps tenodesis with labral debridement versus repair.  Risks, complications and benefits of the surgery have been described to him in detail and he understands this completely.  We will plan on setting him up for this at some point in the near future.   Amberia Bayless J Corinda Ammon, PA-C 02/08/2019, 5:25 PM

## 2019-02-09 ENCOUNTER — Encounter (HOSPITAL_BASED_OUTPATIENT_CLINIC_OR_DEPARTMENT_OTHER): Payer: Self-pay

## 2019-02-09 ENCOUNTER — Other Ambulatory Visit: Payer: Self-pay

## 2019-02-09 ENCOUNTER — Encounter (HOSPITAL_BASED_OUTPATIENT_CLINIC_OR_DEPARTMENT_OTHER)
Admission: RE | Disposition: A | Discharge status: Home / Self Care | Payer: Self-pay | Source: Home / Self Care | Attending: Orthopedic Surgery

## 2019-02-09 ENCOUNTER — Ambulatory Visit (HOSPITAL_BASED_OUTPATIENT_CLINIC_OR_DEPARTMENT_OTHER): Payer: Managed Care, Other (non HMO) | Admitting: Anesthesiology

## 2019-02-09 ENCOUNTER — Ambulatory Visit (HOSPITAL_BASED_OUTPATIENT_CLINIC_OR_DEPARTMENT_OTHER)
Admission: RE | Admit: 2019-02-09 | Discharge: 2019-02-09 | Disposition: A | Discharge status: Home / Self Care | Payer: Managed Care, Other (non HMO) | Attending: Orthopedic Surgery | Admitting: Orthopedic Surgery

## 2019-02-09 DIAGNOSIS — Z9049 Acquired absence of other specified parts of digestive tract: Secondary | ICD-10-CM | POA: Diagnosis not present

## 2019-02-09 DIAGNOSIS — F411 Generalized anxiety disorder: Secondary | ICD-10-CM | POA: Diagnosis present

## 2019-02-09 DIAGNOSIS — S43432A Superior glenoid labrum lesion of left shoulder, initial encounter: Secondary | ICD-10-CM | POA: Diagnosis present

## 2019-02-09 DIAGNOSIS — M94212 Chondromalacia, left shoulder: Secondary | ICD-10-CM | POA: Diagnosis not present

## 2019-02-09 DIAGNOSIS — X58XXXA Exposure to other specified factors, initial encounter: Secondary | ICD-10-CM | POA: Diagnosis not present

## 2019-02-09 DIAGNOSIS — M75102 Unspecified rotator cuff tear or rupture of left shoulder, not specified as traumatic: Secondary | ICD-10-CM | POA: Diagnosis present

## 2019-02-09 DIAGNOSIS — F17210 Nicotine dependence, cigarettes, uncomplicated: Secondary | ICD-10-CM | POA: Diagnosis not present

## 2019-02-09 DIAGNOSIS — F1010 Alcohol abuse, uncomplicated: Secondary | ICD-10-CM | POA: Diagnosis present

## 2019-02-09 DIAGNOSIS — S46122A Laceration of muscle, fascia and tendon of long head of biceps, left arm, initial encounter: Secondary | ICD-10-CM | POA: Insufficient documentation

## 2019-02-09 DIAGNOSIS — I10 Essential (primary) hypertension: Secondary | ICD-10-CM | POA: Diagnosis present

## 2019-02-09 HISTORY — PX: SHOULDER ARTHROSCOPY WITH ROTATOR CUFF REPAIR AND SUBACROMIAL DECOMPRESSION: SHX5686

## 2019-02-09 HISTORY — DX: Superior glenoid labrum lesion of left shoulder, initial encounter: S43.432A

## 2019-02-09 HISTORY — DX: Unspecified rotator cuff tear or rupture of left shoulder, not specified as traumatic: M75.102

## 2019-02-09 LAB — BASIC METABOLIC PANEL
Anion gap: 8 (ref 5–15)
BUN: 9 mg/dL (ref 6–20)
CO2: 28 mmol/L (ref 22–32)
Calcium: 9.4 mg/dL (ref 8.9–10.3)
Chloride: 104 mmol/L (ref 98–111)
Creatinine, Ser: 0.86 mg/dL (ref 0.61–1.24)
GFR calc Af Amer: >60 mL/min (ref >60)
GFR calc non Af Amer: >60 mL/min (ref >60)
Glucose, Bld: 117 mg/dL — ABNORMAL HIGH (ref 70–99)
Potassium: 3.8 mmol/L (ref 3.5–5.1)
SODIUM: 140 mmol/L (ref 135–145)

## 2019-02-09 SURGERY — SHOULDER ARTHROSCOPY WITH ROTATOR CUFF REPAIR AND SUBACROMIAL DECOMPRESSION
Anesthesia: General | Laterality: Left

## 2019-02-09 MED ORDER — PROPOFOL 10 MG/ML IV BOLUS
INTRAVENOUS | Status: AC
  Filled 2019-02-09: qty 20

## 2019-02-09 MED ORDER — SUGAMMADEX SODIUM 200 MG/2ML IV SOLN
INTRAVENOUS | Status: DC | PRN
  Administered 2019-02-09: 200 mg via INTRAVENOUS

## 2019-02-09 MED ORDER — LIDOCAINE 2% (20 MG/ML) 5 ML SYRINGE
INTRAMUSCULAR | Status: AC
  Filled 2019-02-09: qty 5

## 2019-02-09 MED ORDER — METHYLPREDNISOLONE ACETATE 80 MG/ML IJ SUSP
INTRAMUSCULAR | Status: DC | PRN
  Administered 2019-02-09: 80 mg

## 2019-02-09 MED ORDER — PROPOFOL 10 MG/ML IV BOLUS
INTRAVENOUS | Status: DC | PRN
  Administered 2019-02-09: 50 mg via INTRAVENOUS
  Administered 2019-02-09: 150 mg via INTRAVENOUS
  Administered 2019-02-09: 40 mg via INTRAVENOUS

## 2019-02-09 MED ORDER — MEPERIDINE HCL 25 MG/ML IJ SOLN: 6.2500 mg | INTRAMUSCULAR | Status: DC | PRN

## 2019-02-09 MED ORDER — OXYCODONE HCL 5 MG PO TABS: 5.0000 mg | ORAL_TABLET | ORAL | 0 refills | Status: DC | PRN

## 2019-02-09 MED ORDER — PROMETHAZINE HCL 25 MG/ML IJ SOLN: 6.2500 mg | INTRAMUSCULAR | Status: DC | PRN

## 2019-02-09 MED ORDER — LIDOCAINE 2% (20 MG/ML) 5 ML SYRINGE
INTRAMUSCULAR | Status: DC | PRN
  Administered 2019-02-09: 60 mg via INTRAVENOUS

## 2019-02-09 MED ORDER — DEXAMETHASONE SODIUM PHOSPHATE 4 MG/ML IJ SOLN
INTRAMUSCULAR | Status: DC | PRN
  Administered 2019-02-09: 10 mg via INTRAVENOUS

## 2019-02-09 MED ORDER — SCOPOLAMINE 1 MG/3DAYS TD PT72: 1.0000 | MEDICATED_PATCH | Freq: Once | TRANSDERMAL | Status: DC | PRN

## 2019-02-09 MED ORDER — FENTANYL CITRATE (PF) 100 MCG/2ML IJ SOLN
INTRAMUSCULAR | Status: AC
  Filled 2019-02-09: qty 2

## 2019-02-09 MED ORDER — ACETAMINOPHEN 160 MG/5ML PO SOLN: 325.0000 mg | Freq: Once | ORAL | Status: DC

## 2019-02-09 MED ORDER — FENTANYL CITRATE (PF) 100 MCG/2ML IJ SOLN
50.0000 ug | INTRAMUSCULAR | Status: DC | PRN
  Administered 2019-02-09 (×2): 50 ug via INTRAVENOUS

## 2019-02-09 MED ORDER — CEFAZOLIN SODIUM-DEXTROSE 2-4 GM/100ML-% IV SOLN
2.0000 g | INTRAVENOUS | Status: AC
  Administered 2019-02-09: 2 g via INTRAVENOUS

## 2019-02-09 MED ORDER — LACTATED RINGERS IV SOLN: INTRAVENOUS | Status: DC

## 2019-02-09 MED ORDER — METHYLPREDNISOLONE ACETATE 40 MG/ML IJ SUSP
INTRAMUSCULAR | Status: AC
  Filled 2019-02-09: qty 1

## 2019-02-09 MED ORDER — SUCCINYLCHOLINE CHLORIDE 20 MG/ML IJ SOLN
INTRAMUSCULAR | Status: DC | PRN
  Administered 2019-02-09: 140 mg via INTRAVENOUS

## 2019-02-09 MED ORDER — LACTATED RINGERS IV SOLN
INTRAVENOUS | Status: DC
  Administered 2019-02-09: 10:00:00 via INTRAVENOUS

## 2019-02-09 MED ORDER — MIDAZOLAM HCL 2 MG/2ML IJ SOLN
1.0000 mg | INTRAMUSCULAR | Status: DC | PRN
  Administered 2019-02-09: 2 mg via INTRAVENOUS

## 2019-02-09 MED ORDER — POVIDONE-IODINE 7.5 % EX SOLN: Freq: Once | CUTANEOUS | Status: DC

## 2019-02-09 MED ORDER — CHLORHEXIDINE GLUCONATE 4 % EX LIQD: 60.0000 mL | Freq: Once | CUTANEOUS | Status: DC

## 2019-02-09 MED ORDER — ALBUTEROL SULFATE HFA 108 (90 BASE) MCG/ACT IN AERS
INHALATION_SPRAY | RESPIRATORY_TRACT | Status: DC | PRN
  Administered 2019-02-09: 2 via RESPIRATORY_TRACT

## 2019-02-09 MED ORDER — SODIUM CHLORIDE 0.9 % IV SOLN
INTRAVENOUS | Status: DC | PRN
  Administered 2019-02-09: 80 ug via INTRAVENOUS

## 2019-02-09 MED ORDER — ROCURONIUM BROMIDE 100 MG/10ML IV SOLN
INTRAVENOUS | Status: DC | PRN
  Administered 2019-02-09: 30 mg via INTRAVENOUS

## 2019-02-09 MED ORDER — METHYLPREDNISOLONE ACETATE 80 MG/ML IJ SUSP
INTRAMUSCULAR | Status: AC
  Filled 2019-02-09: qty 1

## 2019-02-09 MED ORDER — ACETAMINOPHEN 10 MG/ML IV SOLN: 1000.0000 mg | Freq: Once | INTRAVENOUS | Status: DC | PRN

## 2019-02-09 MED ORDER — SODIUM CHLORIDE 0.9 % IV SOLN
INTRAVENOUS | Status: DC | PRN
  Administered 2019-02-09: 50 ug/min via INTRAVENOUS

## 2019-02-09 MED ORDER — BUPIVACAINE-EPINEPHRINE (PF) 0.25% -1:200000 IJ SOLN
INTRAMUSCULAR | Status: AC
  Filled 2019-02-09: qty 30

## 2019-02-09 MED ORDER — MIDAZOLAM HCL 2 MG/2ML IJ SOLN
INTRAMUSCULAR | Status: AC
  Filled 2019-02-09: qty 2

## 2019-02-09 MED ORDER — ONDANSETRON HCL 4 MG/2ML IJ SOLN
INTRAMUSCULAR | Status: DC | PRN
  Administered 2019-02-09: 4 mg via INTRAVENOUS

## 2019-02-09 MED ORDER — BUPIVACAINE-EPINEPHRINE (PF) 0.5% -1:200000 IJ SOLN
INTRAMUSCULAR | Status: DC | PRN
  Administered 2019-02-09: 20 mL via PERINEURAL

## 2019-02-09 MED ORDER — BUPIVACAINE LIPOSOME 1.3 % IJ SUSP
INTRAMUSCULAR | Status: DC | PRN
  Administered 2019-02-09: 10 mL via PERINEURAL

## 2019-02-09 MED ORDER — HYDROMORPHONE HCL 1 MG/ML IJ SOLN: 0.2500 mg | INTRAMUSCULAR | Status: DC | PRN

## 2019-02-09 MED ORDER — ACETAMINOPHEN 325 MG PO TABS: 325.0000 mg | ORAL_TABLET | Freq: Once | ORAL | Status: DC

## 2019-02-09 MED ORDER — LACTATED RINGERS IV SOLN
INTRAVENOUS | Status: DC
  Administered 2019-02-09: 11:00:00 via INTRAVENOUS

## 2019-02-09 MED ORDER — OXYCODONE HCL 5 MG PO TABS: 5.0000 mg | ORAL_TABLET | Freq: Once | ORAL | Status: DC | PRN

## 2019-02-09 MED ORDER — OXYCODONE HCL 5 MG/5ML PO SOLN: 5.0000 mg | Freq: Once | ORAL | Status: DC | PRN

## 2019-02-09 MED ORDER — BUPIVACAINE-EPINEPHRINE 0.25% -1:200000 IJ SOLN
INTRAMUSCULAR | Status: DC | PRN
  Administered 2019-02-09: 10 mL

## 2019-02-09 MED ORDER — CEFAZOLIN SODIUM-DEXTROSE 2-4 GM/100ML-% IV SOLN
INTRAVENOUS | Status: AC
  Filled 2019-02-09: qty 100

## 2019-02-09 SURGICAL SUPPLY — 84 items
ANCHOR SUT BIO SW 4.75X19.1 (Anchor) ×3 IMPLANT
BENZOIN TINCTURE PRP APPL 2/3 (GAUZE/BANDAGES/DRESSINGS) IMPLANT
BLADE CUDA 5.5 (BLADE) IMPLANT
BLADE EXCALIBUR 4.0MM X 13CM (MISCELLANEOUS)
BLADE EXCALIBUR 4.0X13 (MISCELLANEOUS) IMPLANT
BLADE SURG 15 STRL LF DISP TIS (BLADE) IMPLANT
BLADE SURG 15 STRL SS (BLADE)
BNDG COHESIVE 4X5 TAN STRL (GAUZE/BANDAGES/DRESSINGS) IMPLANT
BURR OVAL 8 FLU 5.0MM X 13CM (MISCELLANEOUS) ×1
BURR OVAL 8 FLU 5.0X13 (MISCELLANEOUS) ×2 IMPLANT
CANNULA TWIST IN 8.25X7CM (CANNULA) IMPLANT
CLOSURE WOUND 1/2 X4 (GAUZE/BANDAGES/DRESSINGS)
COVER WAND RF STERILE (DRAPES) IMPLANT
DECANTER SPIKE VIAL GLASS SM (MISCELLANEOUS) IMPLANT
DISSECTOR  3.8MM X 13CM (MISCELLANEOUS) ×2
DISSECTOR 3.8MM X 13CM (MISCELLANEOUS) ×1 IMPLANT
DRAPE SHOULDER BEACH CHAIR (DRAPES) ×3 IMPLANT
DRAPE U-SHAPE 47X51 STRL (DRAPES) ×6 IMPLANT
DRSG PAD ABDOMINAL 8X10 ST (GAUZE/BANDAGES/DRESSINGS) ×3 IMPLANT
DURAPREP 26ML APPLICATOR (WOUND CARE) ×3 IMPLANT
ELECT REM PT RETURN 9FT ADLT (ELECTROSURGICAL) ×3
ELECTRODE REM PT RTRN 9FT ADLT (ELECTROSURGICAL) ×1 IMPLANT
GAUZE SPONGE 4X4 12PLY STRL (GAUZE/BANDAGES/DRESSINGS) ×3 IMPLANT
GAUZE XEROFORM 1X8 LF (GAUZE/BANDAGES/DRESSINGS) ×3 IMPLANT
GLOVE BIO SURGEON STRL SZ7 (GLOVE) IMPLANT
GLOVE BIOGEL PI IND STRL 7.0 (GLOVE) IMPLANT
GLOVE BIOGEL PI IND STRL 7.5 (GLOVE) ×1 IMPLANT
GLOVE BIOGEL PI INDICATOR 7.0 (GLOVE)
GLOVE BIOGEL PI INDICATOR 7.5 (GLOVE) ×2
GLOVE SS BIOGEL STRL SZ 7.5 (GLOVE) ×1 IMPLANT
GLOVE SUPERSENSE BIOGEL SZ 7.5 (GLOVE) ×2
GOWN STRL REUS W/ TWL LRG LVL3 (GOWN DISPOSABLE) ×2 IMPLANT
GOWN STRL REUS W/ TWL XL LVL3 (GOWN DISPOSABLE) ×1 IMPLANT
GOWN STRL REUS W/TWL LRG LVL3 (GOWN DISPOSABLE) ×4
GOWN STRL REUS W/TWL XL LVL3 (GOWN DISPOSABLE) ×2
LOOP 2 FIBERLINK CLOSED (SUTURE) IMPLANT
MANIFOLD NEPTUNE II (INSTRUMENTS) ×3 IMPLANT
NDL SAFETY ECLIPSE 18X1.5 (NEEDLE) ×1 IMPLANT
NDL SUT 6 .5 CRC .975X.05 MAYO (NEEDLE) IMPLANT
NEEDLE 1/2 CIR CATGUT .05X1.09 (NEEDLE) IMPLANT
NEEDLE HYPO 18GX1.5 SHARP (NEEDLE) ×2
NEEDLE MAYO TAPER (NEEDLE)
NEEDLE SCORPION MULTI FIRE (NEEDLE) IMPLANT
PACK ARTHROSCOPY DSU (CUSTOM PROCEDURE TRAY) ×3 IMPLANT
PACK BASIN DAY SURGERY FS (CUSTOM PROCEDURE TRAY) ×3 IMPLANT
PAD ALCOHOL SWAB (MISCELLANEOUS) ×6 IMPLANT
PASSER SUT SWIFTSTITCH HIP CRT (INSTRUMENTS) ×3 IMPLANT
PENCIL BUTTON HOLSTER BLD 10FT (ELECTRODE) IMPLANT
PORT APPOLLO RF 90DEGREE MULTI (SURGICAL WAND) IMPLANT
RESTRAINT HEAD UNIVERSAL NS (MISCELLANEOUS) ×3 IMPLANT
SHEET MEDIUM DRAPE 40X70 STRL (DRAPES) IMPLANT
SLEEVE SCD COMPRESS KNEE MED (MISCELLANEOUS) IMPLANT
SLING ARM FOAM STRAP LRG (SOFTGOODS) IMPLANT
SLING ARM IMMOBILIZER MED (SOFTGOODS) IMPLANT
SLING ARM MED ADULT FOAM STRAP (SOFTGOODS) IMPLANT
SLING ARM XL FOAM STRAP (SOFTGOODS) IMPLANT
SLING ULTRA III MED (ORTHOPEDIC SUPPLIES) IMPLANT
SPONGE LAP 4X18 RFD (DISPOSABLE) IMPLANT
STRIP CLOSURE SKIN 1/2X4 (GAUZE/BANDAGES/DRESSINGS) IMPLANT
SUCTION FRAZIER HANDLE 10FR (MISCELLANEOUS)
SUCTION TUBE FRAZIER 10FR DISP (MISCELLANEOUS) IMPLANT
SUT ETHILON 3 0 PS 1 (SUTURE) ×3 IMPLANT
SUT FIBERWIRE #2 38 T-5 BLUE (SUTURE)
SUT PDS AB 2-0 CT2 27 (SUTURE) IMPLANT
SUT PROLENE 3 0 PS 2 (SUTURE) IMPLANT
SUT TIGER TAPE 7 IN WHITE (SUTURE) IMPLANT
SUT VIC AB 0 SH 27 (SUTURE) IMPLANT
SUT VIC AB 2-0 PS2 27 (SUTURE) IMPLANT
SUT VIC AB 2-0 SH 27 (SUTURE)
SUT VIC AB 2-0 SH 27XBRD (SUTURE) IMPLANT
SUTURE FIBERWR #2 38 T-5 BLUE (SUTURE) IMPLANT
SUTURE TAPE TIGERLINK 1.3MM BL (SUTURE) ×1 IMPLANT
SUTURETAPE TIGERLINK 1.3MM BL (SUTURE) ×3
SYR 5ML LL (SYRINGE) ×3 IMPLANT
SYR BULB 3OZ (MISCELLANEOUS) IMPLANT
TAPE FIBER 2MM 7IN #2 BLUE (SUTURE) IMPLANT
TAPE HYPAFIX 6 X30' (GAUZE/BANDAGES/DRESSINGS)
TAPE HYPAFIX 6X30 (GAUZE/BANDAGES/DRESSINGS) IMPLANT
TAPE STRIPS DRAPE STRL (GAUZE/BANDAGES/DRESSINGS) ×3 IMPLANT
TOWEL GREEN STERILE FF (TOWEL DISPOSABLE) ×3 IMPLANT
TUBE CONNECTING 20'X1/4 (TUBING)
TUBE CONNECTING 20X1/4 (TUBING) IMPLANT
TUBING ARTHROSCOPY IRRIG 16FT (MISCELLANEOUS) ×3 IMPLANT
WATER STERILE IRR 1000ML POUR (IV SOLUTION) ×3 IMPLANT

## 2019-02-09 NOTE — Interval H&P Note (Signed)
History and Physical Interval Note:  02/09/2019 11:01 AM  [name]  has presented today for surgery, with the diagnosis of LEFT SHOULDER ROTATOR CUFF TEAR, BURSITIS, PRIMARY OSTEOARTHRITIS.  The various methods of treatment have been discussed with the patient and family. After consideration of risks, benefits and other options for treatment, the patient has consented to  Procedure(s): SHOULDER ARTHROSCOPY WITH ROTATOR CUFF REPAIR AND SUBACROMIAL PARTIAL ACROMIOPLASTY, DISTAL CLAVICULECTOMY, AND EXTENSIVE DEBRIDEMENT (Left) as a surgical intervention.  The patient's history has been reviewed, patient examined, no change in status, stable for surgery.  I have reviewed the patient's chart and labs.  Questions were answered to the patient's satisfaction.     [name]

## 2019-02-09 NOTE — Discharge Instructions (Signed)
Regional Anesthesia Blocks ° °1. Numbness or the inability to move the "blocked" extremity may last from 3-48 hours after placement. The length of time depends on the medication injected and your individual response to the medication. If the numbness is not going away after 48 hours, call your surgeon. ° °2. The extremity that is blocked will need to be protected until the numbness is gone and the  Strength has returned. Because you cannot feel it, you will need to take extra care to avoid injury. Because it may be weak, you may have difficulty moving it or using it. You may not know what position it is in without looking at it while the block is in effect. ° °3. For blocks in the legs and feet, returning to weight bearing and walking needs to be done carefully. You will need to wait until the numbness is entirely gone and the strength has returned. You should be able to move your leg and foot normally before you try and bear weight or walk. You will need someone to be with you when you first try to ensure you do not fall and possibly risk injury. ° °4. Bruising and tenderness at the needle site are common side effects and will resolve in a few days. ° °5. Persistent numbness or new problems with movement should be communicated to the surgeon or the [facility] ([phone])/ [facility] ([phone]). ° ° ° ° ° ° °Post Anesthesia Home Care Instructions ° °Activity: °Get plenty of rest for the remainder of the day. A responsible individual must stay with you for 24 hours following the procedure.  °For the next 24 hours, DO NOT: °-Drive a car °-Operate machinery °-Drink alcoholic beverages °-Take any medication unless instructed by your physician °-Make any legal decisions or sign important papers. ° °Meals: °Start with liquid foods such as gelatin or soup. Progress to regular foods as tolerated. Avoid greasy, spicy, heavy foods. If nausea and/or vomiting occur, drink only clear liquids  until the nausea and/or vomiting subsides. Call your physician if vomiting continues. ° °Special Instructions/Symptoms: °Your throat may feel dry or sore from the anesthesia or the breathing tube placed in your throat during surgery. If this causes discomfort, gargle with warm salt water. The discomfort should disappear within 24 hours. ° °If you had a scopolamine patch placed behind your ear for the management of post- operative nausea and/or vomiting: ° °1. The medication in the patch is effective for 72 hours, after which it should be removed.  Wrap patch in a tissue and discard in the trash. Wash hands thoroughly with soap and water. °2. You may remove the patch earlier than 72 hours if you experience unpleasant side effects which may include dry mouth, dizziness or visual disturbances. °3. Avoid touching the patch. Wash your hands with soap and water after contact with the patch. °   ° ° ° ° °Information for Discharge Teaching: °EXPAREL (bupivacaine liposome injectable suspension)  ° °Your surgeon or anesthesiologist gave you EXPAREL(bupivacaine) to help control your pain after surgery.  °· EXPAREL is a local anesthetic that provides pain relief by numbing the tissue around the surgical site. °· EXPAREL is designed to release pain medication over time and can control pain for up to 72 hours. °· Depending on how you respond to EXPAREL, you may require less pain medication during your recovery. ° °Possible side effects: °· Temporary loss of sensation or ability to move in the area where bupivacaine was   injected. °· Nausea, vomiting, constipation °· Rarely, numbness and tingling in your mouth or lips, lightheadedness, or anxiety may occur. °· Call your doctor right away if you think you may be experiencing any of these sensations, or if you have other questions regarding possible side effects. ° °Follow all other discharge instructions given to you by your surgeon or nurse. Eat a healthy diet and drink plenty of  water or other fluids. ° °If you return to the hospital for any reason within 96 hours following the administration of EXPAREL, it is important for health care providers to know that you have received this anesthetic. A teal colored band has been placed on your arm with the date, time and amount of EXPAREL you have received in order to alert and inform your health care providers. Please leave this armband in place for the full 96 hours following administration, and then you may remove the band. °

## 2019-02-09 NOTE — Op Note (Signed)
NAME: [name], [name] MEDICAL RECORD ZO:[id number] ACCOUNT [id number] DATE OF BIRTH:[birth year] FACILITY: [facility] LOCATION: [facility] PHYSICIAN:Guhan Bruington Venetia Maxon, MD  OPERATIVE REPORT  DATE OF PROCEDURE:  02/09/2019  PREOPERATIVE DIAGNOSES:   1.  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 2.  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 3.  Left shoulder chondromalacia.  POSTOPERATIVE DIAGNOSES: 1.  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 2.  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 3.  Left shoulder chondromalacia.  PROCEDURE PERFORMED: 1.  Exam under anesthesia, left shoulder followed by arthroscopically assisted biceps tenodesis using Arthrex SwiveLock anchor x1 with loop and tack technique. 2.  Left shoulder partial rotator cuff, partial labrum debridement with chondroplasty -- extensive.  SURGEON:  Elsie Saas, MD  ASSISTANT:  Matthew Saras, PA.  ANESTHESIA:  General.  OPERATIVE TIME:  45 minutes.  COMPLICATIONS:  None.  INDICATIONS:  The patient is a 53 year old gentleman who has had significant increasing left shoulder pain with repetitive lifting, trauma over the past 6 to 9 months.  Exam and MRIs revealed a partial labrum, partial biceps tendon tear.  He has failed  conservative care and is now to undergo arthroscopy.  DESCRIPTION OF PROCEDURE:  The patient was brought to the operating room on 02/09/2019 after an interscalene block was placed in the holding room by Anesthesia.  He was placed on the operating table in supine position.  He received antibiotics  preoperatively for prophylaxis.  After being placed under general anesthesia, his left shoulder was examined.  He had full range of motion and his shoulder was stable on ligamentous exam.  He was then placed in beach chair position and his shoulder and  arm was prepped and using sterile DuraPrep and draped using  sterile technique.  Time-out procedure was called and the correct left shoulder identified.  Initially through a posterior arthroscopic portal, the arthroscope with pump attached was placed in  through an anterior portal, an arthroscopic probe was placed.  On initial inspection, the articular cartilage in the glenohumeral joint showed 30-40%, grade III chondromalacia, which was debrided.  He had partial tearing of the anterior, superior and  posterior labrum 25%, which was debrided.  The anterior inferior labrum and anterior inferior glenohumeral ligament complex was intact.  Biceps tendon anchor was torn.  The biceps tendon was 80% torn and this was amenable to a biceps tenodesis using a  loop and tack technique, the Arthrex FiberWire was placed and then locked into position on the biceps and then the medial biceps was cut and then the tenodesis was performed with Arthrex SwiveLock anchor and the intra-articular portion of the bicipital  groove with firm and tight fixation.  Superior labrum was thoroughly debrided down to a stable base.  Rotator cuff showed partial tearing 30-40% of the supraspinatus and infraspinatus and the subscap, which was debrided, but it was otherwise well  attached.  The teres minor was intact.  Inferior capsular recess showed significant synovitis, but no other significant pathology was noted.  Subacromial space was entered and a lateral arthroscopic portal was made.  Previous subacromial decompression  and distal clavicle excision had been performed and a revision was not necessary.  The rotator cuff was frayed on the bursal surface, but no deep tearing was noted.  At this point, I felt that all pathology had been satisfactorily addressed.  The  instruments were removed.  Portals closed with 3-0 nylon suture.  Sterile dressings were applied  and the patient awakened and taken to recovery room in stable condition.  FOLLOWUP CARE:  The patient will be followed as an outpatient on  oxycodone and Flexeril and a sling.  He will be seen back in the office in a week for sutures out and followup.  AN/NUANCE  D:02/09/2019 T:02/09/2019 JOB:[id number]

## 2019-02-09 NOTE — Anesthesia Procedure Notes (Signed)
Procedure Name: Intubation Date/Time: 02/09/2019 11:20 AM Performed by: Maryella Shivers, CRNA Pre-anesthesia Checklist: Patient identified, Emergency Drugs available, Suction available and Patient being monitored Patient Re-evaluated:Patient Re-evaluated prior to induction Oxygen Delivery Method: Circle system utilized Preoxygenation: Pre-oxygenation with 100% oxygen Induction Type: IV induction Ventilation: Mask ventilation without difficulty Laryngoscope Size: Mac and 3 Grade View: Grade II Tube type: Oral Tube size: 8.0 mm Number of attempts: 1 Airway Equipment and Method: Stylet and Oral airway Placement Confirmation: ETT inserted through vocal cords under direct vision,  positive ETCO2 and breath sounds checked- equal and bilateral Secured at: 21 cm Tube secured with: Tape Dental Injury: Teeth and Oropharynx as per pre-operative assessment

## 2019-02-09 NOTE — Progress Notes (Signed)
Assisted Dr. Hollis with left, ultrasound guided, interscalene  block. Side rails up, monitors on throughout procedure. See vital signs in flow sheet. Tolerated Procedure well. 

## 2019-02-09 NOTE — Anesthesia Preprocedure Evaluation (Addendum)
Anesthesia Evaluation  Patient identified by MRN, date of birth, ID band Patient awake    Reviewed: Allergy & Precautions, NPO status , Patient's Chart, lab work & pertinent test results  Airway Mallampati: II  TM Distance: >3 FB Neck ROM: Full    Dental  (+) Teeth Intact, Dental Advisory Given   Pulmonary Current Smoker,    breath sounds clear to auscultation       Cardiovascular hypertension, Pt. on medications and Pt. on home beta blockers  Rhythm:Regular Rate:Normal     Neuro/Psych Anxiety    GI/Hepatic Neg liver ROS, PUD, GERD  Medicated,  Endo/Other  negative endocrine ROS  Renal/GU negative Renal ROS     Musculoskeletal  (+) Arthritis ,   Abdominal Normal abdominal exam  (+)   Peds  Hematology   Anesthesia Other Findings   Reproductive/Obstetrics                            Anesthesia Physical Anesthesia Plan  ASA: II  Anesthesia Plan: General   Post-op Pain Management: GA combined w/ Regional for post-op pain   Induction: Intravenous  PONV Risk Score and Plan: 2 and Ondansetron, Dexamethasone and Midazolam  Airway Management Planned: Oral ETT  Additional Equipment: None  Intra-op Plan:   Post-operative Plan: Extubation in OR  Informed Consent: I have reviewed the patients History and Physical, chart, labs and discussed the procedure including the risks, benefits and alternatives for the proposed anesthesia with the patient or authorized representative who has indicated his/her understanding and acceptance.     Dental advisory given  Plan Discussed with: CRNA  Anesthesia Plan Comments:        Anesthesia Quick Evaluation

## 2019-02-09 NOTE — Transfer of Care (Signed)
Immediate Anesthesia Transfer of Care Note  Patient: [name]  Procedure(s) Performed: SHOULDER ARTHROSCOPY WITH ROTATOR CUFF REPAIR AND SUBACROMIAL PARTIAL ACROMIOPLASTY, DISTAL CLAVICULECTOMY, AND EXTENSIVE DEBRIDEMENT (Left )  Patient Location: PACU  Anesthesia Type:GA combined with regional for post-op pain  Level of Consciousness: awake, alert  and oriented  Airway & Oxygen Therapy: Patient Spontanous Breathing and Patient connected to face mask oxygen  Post-op Assessment: Report given to RN and Post -op Vital signs reviewed and stable  Post vital signs: Reviewed and stable  Last Vitals:  Vitals Value Taken Time  BP 137/94 02/09/2019 12:18 PM  Temp    Pulse 80 02/09/2019 12:23 PM  Resp 22 02/09/2019 12:23 PM  SpO2 98 % 02/09/2019 12:23 PM  Vitals shown include unvalidated device data.  Last Pain:  Vitals:   02/09/19 1042  TempSrc:   PainSc: 2       Patients Stated Pain Goal: 3 (44/01/02 7253)  Complications: No apparent anesthesia complications

## 2019-02-09 NOTE — Anesthesia Procedure Notes (Signed)
Anesthesia Regional Block: Interscalene brachial plexus block   Pre-Anesthetic Checklist: ,, timeout performed, Correct Patient, Correct Site, Correct Laterality, Correct Procedure, Correct Position, site marked, Risks and benefits discussed,  Surgical consent,  Pre-op evaluation,  At surgeon's request and post-op pain management  Laterality: Left  Prep: chloraprep       Needles:  Injection technique: Single-shot  Needle Type: Echogenic Stimulator Needle     Needle Length: 9cm  Needle Gauge: 21     Additional Needles:   Procedures:,,,, ultrasound used (permanent image in chart),,,,  Narrative:  Start time: 02/09/2019 10:30 AM End time: 02/09/2019 10:38 AM Injection made incrementally with aspirations every 5 mL.  Performed by: Personally  Anesthesiologist: Effie Berkshire, MD  Additional Notes: Patient tolerated the procedure well. Local anesthetic introduced in an incremental fashion under minimal resistance after negative aspirations. No paresthesias were elicited. After completion of the procedure, no acute issues were identified and patient continued to be monitored by RN.

## 2019-02-10 NOTE — Anesthesia Postprocedure Evaluation (Signed)
Anesthesia Post Note  Patient: [name]  Procedure(s) Performed: SHOULDER ARTHROSCOPY WITH ROTATOR CUFF REPAIR AND SUBACROMIAL PARTIAL ACROMIOPLASTY, DISTAL CLAVICULECTOMY, AND EXTENSIVE DEBRIDEMENT (Left )     Patient location during evaluation: PACU Anesthesia Type: General Level of consciousness: sedated Pain management: pain level controlled Vital Signs Assessment: post-procedure vital signs reviewed and stable Respiratory status: spontaneous breathing and respiratory function stable Cardiovascular status: stable Postop Assessment: no apparent nausea or vomiting Anesthetic complications: no    Last Vitals:  Vitals:   02/09/19 1330 02/09/19 1400  BP:  140/87  Pulse: 73 81  Resp: 20 18  Temp:  36.5 C  SpO2: 91% 93%    Last Pain:  Vitals:   02/09/19 1400  TempSrc:   PainSc: 0-No pain                 Vida Nicol DANIEL

## 2019-02-11 ENCOUNTER — Encounter (HOSPITAL_BASED_OUTPATIENT_CLINIC_OR_DEPARTMENT_OTHER): Payer: Self-pay | Admitting: Orthopedic Surgery

## 2019-03-10 ENCOUNTER — Other Ambulatory Visit: Payer: Self-pay | Admitting: Family Medicine

## 2019-03-22 ENCOUNTER — Other Ambulatory Visit: Payer: Self-pay | Admitting: Family Medicine

## 2019-04-22 ENCOUNTER — Other Ambulatory Visit: Payer: Self-pay | Admitting: Family Medicine

## 2019-04-22 MED ORDER — CLONAZEPAM 0.5 MG PO TABS: 0.5000 mg | ORAL_TABLET | Freq: Two times a day (BID) | ORAL | 2 refills | Status: DC | PRN

## 2019-04-22 NOTE — Telephone Encounter (Signed)
Pt is requesting refill on Klonopin  LOV: 11/09/18  LRF:  01/06/19

## 2019-05-02 ENCOUNTER — Other Ambulatory Visit: Payer: Self-pay | Admitting: Family Medicine

## 2019-05-07 ENCOUNTER — Other Ambulatory Visit: Payer: Self-pay | Admitting: Family Medicine

## 2019-05-18 ENCOUNTER — Other Ambulatory Visit (HOSPITAL_COMMUNITY): Payer: Self-pay | Admitting: Orthopedic Surgery

## 2019-05-18 ENCOUNTER — Other Ambulatory Visit: Payer: Self-pay | Admitting: Orthopedic Surgery

## 2019-05-18 DIAGNOSIS — M5412 Radiculopathy, cervical region: Secondary | ICD-10-CM

## 2019-05-25 ENCOUNTER — Encounter (HOSPITAL_COMMUNITY): Payer: Self-pay

## 2019-05-25 ENCOUNTER — Ambulatory Visit (HOSPITAL_COMMUNITY): Payer: Managed Care, Other (non HMO)

## 2019-05-30 ENCOUNTER — Other Ambulatory Visit: Payer: Self-pay

## 2019-05-30 ENCOUNTER — Ambulatory Visit (INDEPENDENT_AMBULATORY_CARE_PROVIDER_SITE_OTHER): Payer: Managed Care, Other (non HMO) | Admitting: Family Medicine

## 2019-05-30 ENCOUNTER — Encounter: Payer: Self-pay | Admitting: Family Medicine

## 2019-05-30 VITALS — BP 144/92 | HR 88 | Temp 98.1°F | Resp 16 | Ht 68.0 in | Wt 200.0 lb

## 2019-05-30 DIAGNOSIS — L03011 Cellulitis of right finger: Secondary | ICD-10-CM | POA: Diagnosis not present

## 2019-05-30 MED ORDER — SULFAMETHOXAZOLE-TRIMETHOPRIM 800-160 MG PO TABS: 1.0000 | ORAL_TABLET | Freq: Two times a day (BID) | ORAL | 0 refills | Status: DC

## 2019-05-30 NOTE — Progress Notes (Signed)
Subjective:    Patient ID: [name], male    DOB: Apr 30, 1966, 53 y.o.   MRN: [id number]  HPI Patient states that approximately 3 weeks ago, the tip of his right third finger became swollen and painful.  It is now noticeably more swollen distal to the DIP joint compared to the left third finger.  There is also yellow and white purulent material oozing from around the radial portion of the fingernail.  It is tender to palpation.  Although swollen and tender there is no significant erythema. Past Medical History:  Diagnosis Date  . Allergy   . Anemia   . Colonic diverticular abscess 12/26/2014  . Diverticulitis   . ETOH abuse   . GAD (generalized anxiety disorder)   . GERD (gastroesophageal reflux disease)   . Hypercholesteremia   . Hypertension   . Left rotator cuff tear 02/08/2019  . Superior labrum anterior-to-posterior (SLAP) tear of left shoulder 02/08/2019   Past Surgical History:  Procedure Laterality Date  . APPENDECTOMY N/A 01/01/2015   Procedure: INCIDENTAL APPENDECTOMY;  Surgeon: Jamesetta So, MD;  Location: [facility];  Service: General;  Laterality: N/A;  . BACK SURGERY     lower back, fusion  . COLONOSCOPY WITH PROPOFOL N/A 12/21/2017   Procedure: COLONOSCOPY WITH PROPOFOL;  Surgeon: Daneil Dolin, MD;  Location: [facility];  Service: Endoscopy;  Laterality: N/A;  9:45am  . GASTRECTOMY N/A 04/14/2016   Procedure: Toniann Ket;  Surgeon: Aviva Signs, MD;  Location: [facility];  Service: General;  Laterality: N/A;  . HEMORRHOID SURGERY    . HERNIA REPAIR     as child  . PARTIAL COLECTOMY N/A 01/01/2015   Procedure: PARTIAL COLECTOMY;  Surgeon: Jamesetta So, MD;  Location: [facility];  Service: General;  Laterality: N/A;  . POLYPECTOMY  12/21/2017   Procedure: POLYPECTOMY;  Surgeon: Daneil Dolin, MD;  Location: [facility];  Service: Endoscopy;;  colon  . ROTATOR CUFF REPAIR Bilateral   . SHOULDER ARTHROSCOPY WITH ROTATOR CUFF REPAIR AND SUBACROMIAL DECOMPRESSION  Left 02/09/2019   Procedure: SHOULDER ARTHROSCOPY WITH ROTATOR CUFF REPAIR AND SUBACROMIAL PARTIAL ACROMIOPLASTY, DISTAL CLAVICULECTOMY, AND EXTENSIVE DEBRIDEMENT;  Surgeon: Elsie Saas, MD;  Location: [place];  Service: Orthopedics;  Laterality: Left;   Current Outpatient Medications on File Prior to Visit  Medication Sig Dispense Refill  . acetaminophen (TYLENOL) 500 MG tablet Take 500 mg by mouth every 6 (six) hours as needed for moderate pain or headache.    Marland Kitchen amLODipine (NORVASC) 10 MG tablet TAKE 1 TABLET (10 MG TOTAL) BY MOUTH DAILY. THIS REPLACES METOPROLOL 90 tablet 3  . atorvastatin (LIPITOR) 40 MG tablet TAKE 1 TABLET BY MOUTH EVERY DAY 90 tablet 1  . carvedilol (COREG) 12.5 MG tablet TAKE 1 TABLET (12.5 MG TOTAL) BY MOUTH 2 (TWO) TIMES DAILY WITH A MEAL. 180 tablet 3  . clonazePAM (KLONOPIN) 0.5 MG tablet Take 1 tablet (0.5 mg total) by mouth 2 (two) times daily as needed for anxiety. 30 tablet 2  . cyclobenzaprine (FLEXERIL) 10 MG tablet TAKE 1 TABLET BY MOUTH EVERYDAY AT BEDTIME 30 tablet 2  . fluticasone (FLONASE) 50 MCG/ACT nasal spray SPRAY 2 SPRAYS INTO EACH NOSTRIL EVERY DAY 48 g 3  . hydrochlorothiazide (MICROZIDE) 12.5 MG capsule TAKE 1 CAPSULE BY MOUTH EVERY DAY 90 capsule 1  . losartan (COZAAR) 50 MG tablet TAKE 1 TABLET BY MOUTH EVERY DAY 90 tablet 1  . pantoprazole (PROTONIX) 40 MG tablet Take 1 tablet (  40 mg total) by mouth 2 (two) times daily. 180 tablet 3  . venlafaxine XR (EFFEXOR-XR) 150 MG 24 hr capsule TAKE ONE CAPSULE BY MOUTH IN THE MORNING 90 capsule 3   No current facility-administered medications on file prior to visit.    No Known Allergies Social History   Socioeconomic History  . Marital status: Married    Spouse name: Not on file  . Number of children: Not on file  . Years of education: Not on file  . Highest education level: Not on file  Occupational History  . Not on file  Social Needs  . Financial resource strain: Not on  file  . Food insecurity    Worry: Not on file    Inability: Not on file  . Transportation needs    Medical: Not on file    Non-medical: Not on file  Tobacco Use  . Smoking status: Current Every Day Smoker    Packs/day: 1.00    Years: 35.00    Pack years: 35.00    Types: Cigarettes  . Smokeless tobacco: Never Used  Substance and Sexual Activity  . Alcohol use: Yes    Alcohol/week: 0.0 standard drinks    Comment: patient states "about 5 or 6 a day" (beers)  . Drug use: No  . Sexual activity: Yes  Lifestyle  . Physical activity    Days per week: Not on file    Minutes per session: Not on file  . Stress: Not on file  Relationships  . Social Herbalist on phone: Not on file    Gets together: Not on file    Attends religious service: Not on file    Active member of club or organization: Not on file    Attends meetings of clubs or organizations: Not on file    Relationship status: Not on file  . Intimate partner violence    Fear of current or ex partner: Not on file    Emotionally abused: Not on file    Physically abused: Not on file    Forced sexual activity: Not on file  Other Topics Concern  . Not on file  Social History Narrative  . Not on file      Review of Systems  All other systems reviewed and are negative.      Objective:   Physical Exam Vitals signs reviewed.  Constitutional:      General: He is not in acute distress.    Appearance: Normal appearance. He is not ill-appearing or toxic-appearing.  Cardiovascular:     Rate and Rhythm: Normal rate and regular rhythm.     Heart sounds: No murmur.  Pulmonary:     Effort: Pulmonary effort is normal.     Breath sounds: Normal breath sounds.  Neurological:     Mental Status: He is alert.     Right third digit distal to the DIP joint is swollen and edematous.  It is tender to palpation.  There is yellow purulent material oozing from under the radial margin of the fingernail.      Assessment  & Plan:  1. Paronychia of finger, right This is an unusual presentation.  There is no significant erythema but there is swelling and tenderness and what appears to be purulent material leaking from under the radial portion of the fingernail.  Therefore I think the patient may have a paronychia that could be subacute or chronic.  I will treat the patient for possible MRSA with  Bactrim double strength tablets twice daily for 7 days.  If no better in 1 week I would recommend an x-ray of the right third digit to evaluate for any possible foreign body that could be in the distal fingertip such as a piece of metal etc. that may be festering however there is no visible foreign body seen in or around the skin or puncture wound

## 2019-06-01 ENCOUNTER — Other Ambulatory Visit: Payer: Self-pay

## 2019-06-01 ENCOUNTER — Ambulatory Visit (HOSPITAL_COMMUNITY)
Admission: RE | Admit: 2019-06-01 | Discharge: 2019-06-01 | Disposition: A | Discharge status: Home / Self Care | Payer: Managed Care, Other (non HMO) | Source: Ambulatory Visit | Attending: Orthopedic Surgery | Admitting: Orthopedic Surgery

## 2019-06-01 DIAGNOSIS — M5412 Radiculopathy, cervical region: Secondary | ICD-10-CM | POA: Diagnosis not present

## 2019-06-10 ENCOUNTER — Ambulatory Visit (INDEPENDENT_AMBULATORY_CARE_PROVIDER_SITE_OTHER): Payer: Managed Care, Other (non HMO) | Admitting: Family Medicine

## 2019-06-10 ENCOUNTER — Encounter: Payer: Self-pay | Admitting: Family Medicine

## 2019-06-10 ENCOUNTER — Other Ambulatory Visit: Payer: Self-pay

## 2019-06-10 VITALS — BP 140/82 | HR 78 | Temp 98.6°F | Resp 16 | Ht 68.0 in | Wt 199.0 lb

## 2019-06-10 DIAGNOSIS — M79644 Pain in right finger(s): Secondary | ICD-10-CM | POA: Diagnosis not present

## 2019-06-10 DIAGNOSIS — M7989 Other specified soft tissue disorders: Secondary | ICD-10-CM

## 2019-06-10 MED ORDER — TERBINAFINE HCL 250 MG PO TABS: 250.0000 mg | ORAL_TABLET | Freq: Every day | ORAL | 0 refills | Status: DC

## 2019-06-10 NOTE — Progress Notes (Signed)
Subjective:    Patient ID: [name], male    DOB: 1966/04/13, 53 y.o.   MRN: [id number]  HPI Patient presents today with painful swelling in his right third finger distal to the PIP joint.  Originally was treated with some antibiotics with no improvement.  I have included 2 pictures below to demonstrate the amount of swelling.     The finger pad especially is swollen and painful.  There is no erythema.  There is no warmth.  There is no visible pus or discoloration or visible foreign body.  Patient states that his been like this for several weeks.  He denies any specific injury.  He is able to bend his PIP and DIP joints without pain.  He also has a rash on his right palm.    Rash on the palm includes small punctate brown macules.  These are seen most pronounced over the hyperthenar eminence as well as over the tip of his fifth digit.  There is also desquamation over the tips of his second and fifth fingers as seen above.  He has a similar rash on the soles of both feet consistent with tinea pedis. Past Medical History:  Diagnosis Date  . Allergy   . Anemia   . Colonic diverticular abscess 12/26/2014  . Diverticulitis   . ETOH abuse   . GAD (generalized anxiety disorder)   . GERD (gastroesophageal reflux disease)   . Hypercholesteremia   . Hypertension   . Left rotator cuff tear 02/08/2019  . Superior labrum anterior-to-posterior (SLAP) tear of left shoulder 02/08/2019   Past Surgical History:  Procedure Laterality Date  . APPENDECTOMY N/A 01/01/2015   Procedure: INCIDENTAL APPENDECTOMY;  Surgeon: Jamesetta So, MD;  Location: [facility];  Service: General;  Laterality: N/A;  . BACK SURGERY     lower back, fusion  . COLONOSCOPY WITH PROPOFOL N/A 12/21/2017   Procedure: COLONOSCOPY WITH PROPOFOL;  Surgeon: Daneil Dolin, MD;  Location: [facility];  Service: Endoscopy;  Laterality: N/A;  9:45am  . GASTRECTOMY N/A 04/14/2016   Procedure: Toniann Ket;  Surgeon: Aviva Signs,  MD;  Location: [facility];  Service: General;  Laterality: N/A;  . HEMORRHOID SURGERY    . HERNIA REPAIR     as child  . PARTIAL COLECTOMY N/A 01/01/2015   Procedure: PARTIAL COLECTOMY;  Surgeon: Jamesetta So, MD;  Location: [facility];  Service: General;  Laterality: N/A;  . POLYPECTOMY  12/21/2017   Procedure: POLYPECTOMY;  Surgeon: Daneil Dolin, MD;  Location: [facility];  Service: Endoscopy;;  colon  . ROTATOR CUFF REPAIR Bilateral   . SHOULDER ARTHROSCOPY WITH ROTATOR CUFF REPAIR AND SUBACROMIAL DECOMPRESSION Left 02/09/2019   Procedure: SHOULDER ARTHROSCOPY WITH ROTATOR CUFF REPAIR AND SUBACROMIAL PARTIAL ACROMIOPLASTY, DISTAL CLAVICULECTOMY, AND EXTENSIVE DEBRIDEMENT;  Surgeon: Elsie Saas, MD;  Location: [facility];  Service: Orthopedics;  Laterality: Left;   Current Outpatient Medications on File Prior to Visit  Medication Sig Dispense Refill  . acetaminophen (TYLENOL) 500 MG tablet Take 500 mg by mouth every 6 (six) hours as needed for moderate pain or headache.    Marland Kitchen amLODipine (NORVASC) 10 MG tablet TAKE 1 TABLET (10 MG TOTAL) BY MOUTH DAILY. THIS REPLACES METOPROLOL 90 tablet 3  . atorvastatin (LIPITOR) 40 MG tablet TAKE 1 TABLET BY MOUTH EVERY DAY 90 tablet 1  . carvedilol (COREG) 12.5 MG tablet TAKE 1 TABLET (12.5 MG TOTAL) BY MOUTH 2 (TWO) TIMES DAILY WITH A MEAL. 180 tablet  3  . clonazePAM (KLONOPIN) 0.5 MG tablet Take 1 tablet (0.5 mg total) by mouth 2 (two) times daily as needed for anxiety. 30 tablet 2  . cyclobenzaprine (FLEXERIL) 10 MG tablet TAKE 1 TABLET BY MOUTH EVERYDAY AT BEDTIME 30 tablet 2  . fluticasone (FLONASE) 50 MCG/ACT nasal spray SPRAY 2 SPRAYS INTO EACH NOSTRIL EVERY DAY 48 g 3  . hydrochlorothiazide (MICROZIDE) 12.5 MG capsule TAKE 1 CAPSULE BY MOUTH EVERY DAY 90 capsule 1  . losartan (COZAAR) 50 MG tablet TAKE 1 TABLET BY MOUTH EVERY DAY 90 tablet 1  . pantoprazole (PROTONIX) 40 MG tablet Take 1 tablet (40 mg total) by mouth 2 (two) times  daily. 180 tablet 3  . venlafaxine XR (EFFEXOR-XR) 150 MG 24 hr capsule TAKE ONE CAPSULE BY MOUTH IN THE MORNING 90 capsule 3   No current facility-administered medications on file prior to visit.    No Known Allergies Social History   Socioeconomic History  . Marital status: Married    Spouse name: Not on file  . Number of children: Not on file  . Years of education: Not on file  . Highest education level: Not on file  Occupational History  . Not on file  Social Needs  . Financial resource strain: Not on file  . Food insecurity    Worry: Not on file    Inability: Not on file  . Transportation needs    Medical: Not on file    Non-medical: Not on file  Tobacco Use  . Smoking status: Current Every Day Smoker    Packs/day: 1.00    Years: 35.00    Pack years: 35.00    Types: Cigarettes  . Smokeless tobacco: Never Used  Substance and Sexual Activity  . Alcohol use: Yes    Alcohol/week: 0.0 standard drinks    Comment: patient states "about 5 or 6 a day" (beers)  . Drug use: No  . Sexual activity: Yes  Lifestyle  . Physical activity    Days per week: Not on file    Minutes per session: Not on file  . Stress: Not on file  Relationships  . Social Herbalist on phone: Not on file    Gets together: Not on file    Attends religious service: Not on file    Active member of club or organization: Not on file    Attends meetings of clubs or organizations: Not on file    Relationship status: Not on file  . Intimate partner violence    Fear of current or ex partner: Not on file    Emotionally abused: Not on file    Physically abused: Not on file    Forced sexual activity: Not on file  Other Topics Concern  . Not on file  Social History Narrative  . Not on file      Review of Systems  All other systems reviewed and are negative.      Objective:   Physical Exam Vitals signs reviewed.  Constitutional:      Appearance: Normal appearance.  Cardiovascular:      Rate and Rhythm: Normal rate and regular rhythm.     Heart sounds: Normal heart sounds.  Pulmonary:     Effort: Pulmonary effort is normal.     Breath sounds: Normal breath sounds.  Musculoskeletal:     Right hand: He exhibits tenderness, bony tenderness, deformity and swelling.  Skin:    Findings: Rash present.  Neurological:  Mental Status: He is alert.           Assessment & Plan:  1. Finger pain, right - CBC with Differential/Platelet - COMPLETE METABOLIC PANEL WITH GFR  2. Swelling of finger, right The right third finger is noticeably enlarged compared to the left third finger.  It is painful swelling however there is no erythema or bruising or warmth.  He has full range of motion of the DIP and PIP joints without pain.  Pain primarily involves the finger pad of the third digit distal to the DIP joint.  I am uncertain if the patient has a foreign body or an infection in the bone or a growth on the bone that could be explaining the asymmetric enlargement of the finger.  He denies any injuries or trauma to the finger.  Therefore I will start with imaging of the hand to evaluate further.  I believe the rash on his hand represents a fungal rash.  Most likely this reflects cross-contamination from tinea pedis on both feet.  Therefore I will treat the patient with Lamisil 250 mg p.o. daily for 2 weeks and see if the rash on his hand improves

## 2019-06-11 LAB — COMPLETE METABOLIC PANEL WITH GFR
AG Ratio: 1.5 (calc) (ref 1.0–2.5)
ALT: 81 U/L — ABNORMAL HIGH (ref 9–46)
AST: 48 U/L — ABNORMAL HIGH (ref 10–35)
Albumin: 4.2 g/dL (ref 3.6–5.1)
Alkaline phosphatase (APISO): 92 U/L (ref 35–144)
BUN: 9 mg/dL (ref 7–25)
CO2: 25 mmol/L (ref 20–32)
Calcium: 10 mg/dL (ref 8.6–10.3)
Chloride: 103 mmol/L (ref 98–110)
Creat: 0.78 mg/dL (ref 0.70–1.33)
GFR, Est African American: 119 mL/min/{1.73_m2} (ref >=60)
GFR, Est Non African American: 102 mL/min/{1.73_m2} (ref >=60)
Globulin: 2.8 g/dL (calc) (ref 1.9–3.7)
Glucose, Bld: 105 mg/dL — ABNORMAL HIGH (ref 65–99)
Potassium: 4 mmol/L (ref 3.5–5.3)
Sodium: 138 mmol/L (ref 135–146)
Total Bilirubin: 0.3 mg/dL (ref 0.2–1.2)
Total Protein: 7 g/dL (ref 6.1–8.1)

## 2019-06-11 LAB — CBC WITH DIFFERENTIAL/PLATELET
Absolute Monocytes: 1295 cells/uL — ABNORMAL HIGH (ref 200–950)
Basophils Absolute: 86 cells/uL (ref 0–200)
Basophils Relative: 0.8 %
Eosinophils Absolute: 128 cells/uL (ref 15–500)
Eosinophils Relative: 1.2 %
HCT: 47.6 % (ref 38.5–50.0)
Hemoglobin: 16.8 g/dL (ref 13.2–17.1)
Lymphs Abs: 2504 cells/uL (ref 850–3900)
MCH: 32.9 pg (ref 27.0–33.0)
MCHC: 35.3 g/dL (ref 32.0–36.0)
MCV: 93.2 fL (ref 80.0–100.0)
MPV: 10.1 fL (ref 7.5–12.5)
Monocytes Relative: 12.1 %
Neutro Abs: 6688 cells/uL (ref 1500–7800)
Neutrophils Relative %: 62.5 %
Platelets: 297 10*3/uL (ref 140–400)
RBC: 5.11 10*6/uL (ref 4.20–5.80)
RDW: 13 % (ref 11.0–15.0)
Total Lymphocyte: 23.4 %
WBC: 10.7 10*3/uL (ref 3.8–10.8)

## 2019-06-13 ENCOUNTER — Other Ambulatory Visit: Payer: Self-pay | Admitting: Family Medicine

## 2019-06-13 DIAGNOSIS — R7989 Other specified abnormal findings of blood chemistry: Secondary | ICD-10-CM

## 2019-06-29 ENCOUNTER — Encounter: Payer: Self-pay | Admitting: Internal Medicine

## 2019-07-01 ENCOUNTER — Other Ambulatory Visit: Payer: Self-pay | Admitting: Family Medicine

## 2019-07-05 ENCOUNTER — Other Ambulatory Visit: Payer: Managed Care, Other (non HMO)

## 2019-07-05 ENCOUNTER — Other Ambulatory Visit: Payer: Self-pay

## 2019-07-05 DIAGNOSIS — R7989 Other specified abnormal findings of blood chemistry: Secondary | ICD-10-CM

## 2019-07-06 LAB — COMPREHENSIVE METABOLIC PANEL
AG Ratio: 1.5 (calc) (ref 1.0–2.5)
ALT: 42 U/L (ref 9–46)
AST: 33 U/L (ref 10–35)
Albumin: 4 g/dL (ref 3.6–5.1)
Alkaline phosphatase (APISO): 87 U/L (ref 35–144)
BUN: 10 mg/dL (ref 7–25)
CO2: 27 mmol/L (ref 20–32)
Calcium: 9.8 mg/dL (ref 8.6–10.3)
Chloride: 101 mmol/L (ref 98–110)
Creat: 0.79 mg/dL (ref 0.70–1.33)
Globulin: 2.7 g/dL (calc) (ref 1.9–3.7)
Glucose, Bld: 83 mg/dL (ref 65–99)
Potassium: 4.5 mmol/L (ref 3.5–5.3)
Sodium: 138 mmol/L (ref 135–146)
Total Bilirubin: 0.3 mg/dL (ref 0.2–1.2)
Total Protein: 6.7 g/dL (ref 6.1–8.1)

## 2019-07-25 ENCOUNTER — Other Ambulatory Visit: Payer: Self-pay | Admitting: Family Medicine

## 2019-07-26 NOTE — Telephone Encounter (Signed)
Requesting refill  Klonopin  LOV: 06/10/19  LRF:  04/22/19

## 2019-07-31 ENCOUNTER — Other Ambulatory Visit: Payer: Self-pay | Admitting: Family Medicine

## 2019-08-02 NOTE — Telephone Encounter (Signed)
Last filled: 05/02/2019 Last seen: 05/2019

## 2019-08-20 ENCOUNTER — Other Ambulatory Visit: Payer: Self-pay | Admitting: Family Medicine

## 2019-08-20 DIAGNOSIS — R000 Tachycardia, unspecified: Secondary | ICD-10-CM

## 2019-08-29 ENCOUNTER — Other Ambulatory Visit: Payer: Self-pay | Admitting: Family Medicine

## 2019-08-29 DIAGNOSIS — I10 Essential (primary) hypertension: Secondary | ICD-10-CM

## 2019-09-20 ENCOUNTER — Other Ambulatory Visit: Payer: Self-pay | Admitting: Family Medicine

## 2019-10-07 ENCOUNTER — Other Ambulatory Visit: Payer: Self-pay

## 2019-10-07 DIAGNOSIS — Z20822 Contact with and (suspected) exposure to covid-19: Secondary | ICD-10-CM

## 2019-10-10 LAB — NOVEL CORONAVIRUS, NAA: SARS-CoV-2, NAA: NOT DETECTED

## 2019-10-24 IMAGING — MR MRI CERVICAL SPINE WITHOUT CONTRAST
4 of 5 series · 15 of 48 positions shown · non-contrast
Comparison: None.

CLINICAL DATA: Cramps in both scapula for 1 year. Weakness and pain
in the shoulders and back.

EXAM:
MRI CERVICAL SPINE WITHOUT CONTRAST
TECHNIQUE: Multiplanar, multisequence MR imaging of the cervical spine was
performed. No intravenous contrast was administered.

[Series 3: T2 · sagittal · 3.0mm · 0.42mm/px · 6 of 15 slices shown (1 of 2)]
[im 1/15]
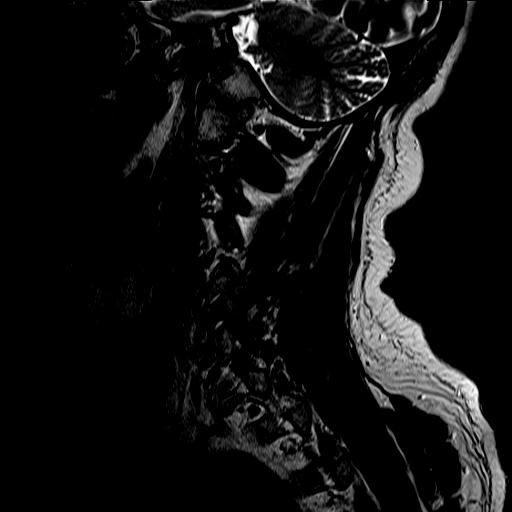
[im 3/15]
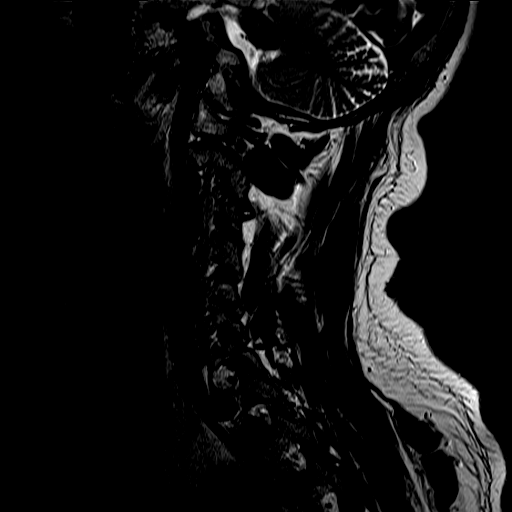
[im 6/15]
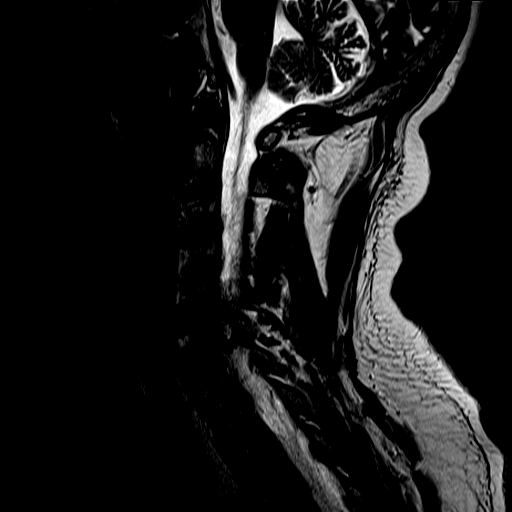
[im 9/15]
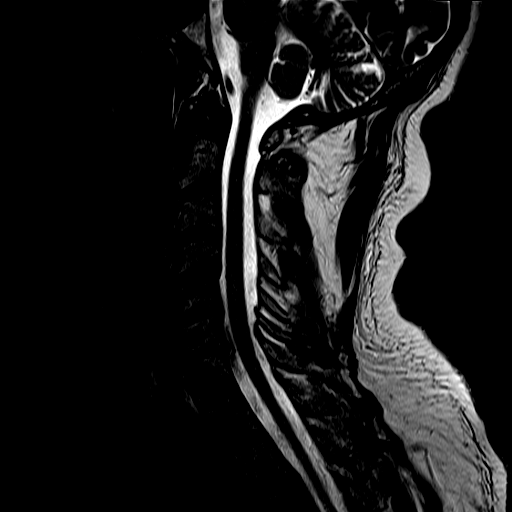
[im 12/15]
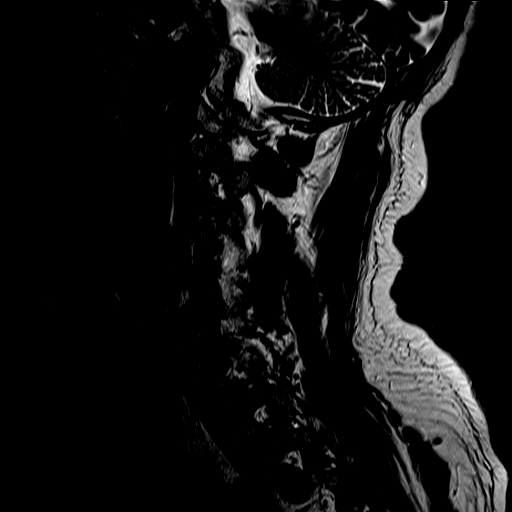
[im 15/15]
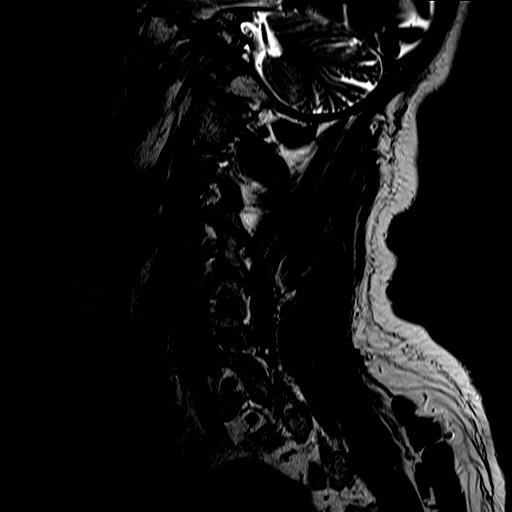

[Series 6: T2 · axial · 3.0mm · 0.31mm/px · z∈[-27,+54]mm · 3 of 36 slices shown (2 of 2)]
[im 6/36]
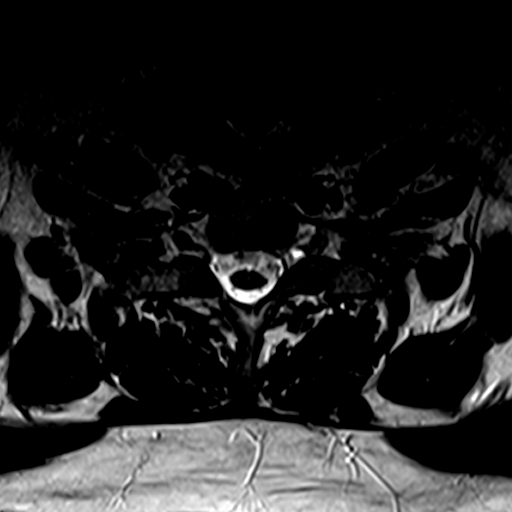
[im 18/36]
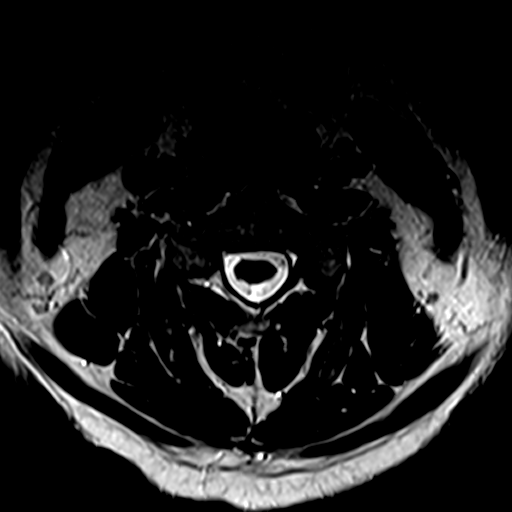
[im 31/36]
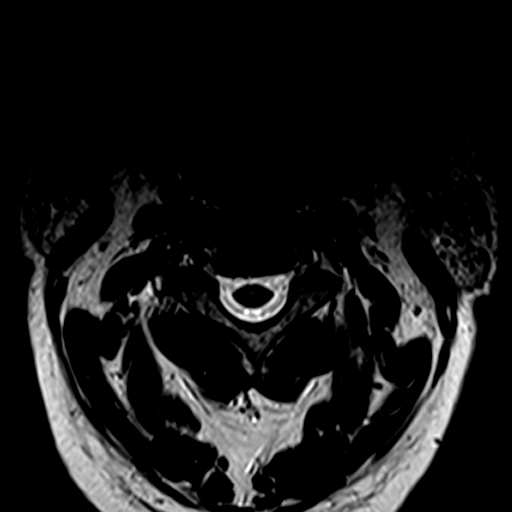

[Series 7: ax mpgr · axial · 3.0mm · 0.32mm/px · z∈[-27,+53]mm · 3 of 36 slices shown]
[im 6/36]
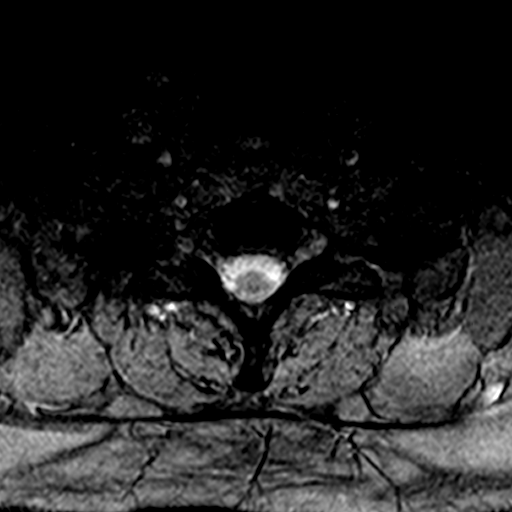
[im 18/36]
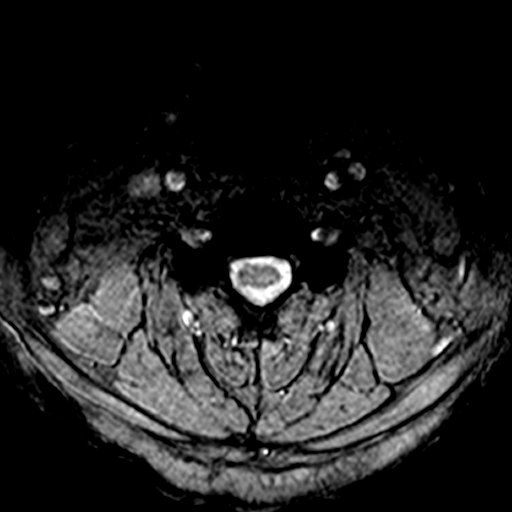
[im 31/36]
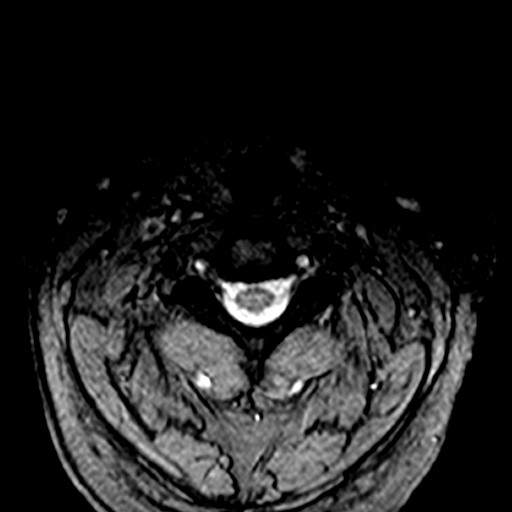

[Series 9: STIR · sagittal · 3.0mm · 0.28mm/px · 3 of 15 slices shown]
[im 3/15]
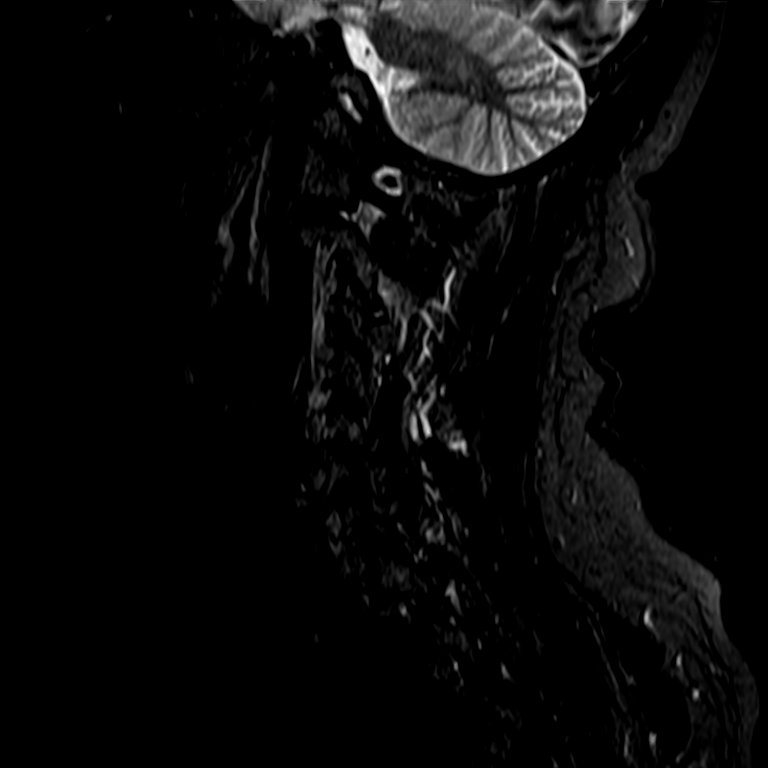
[im 9/15]
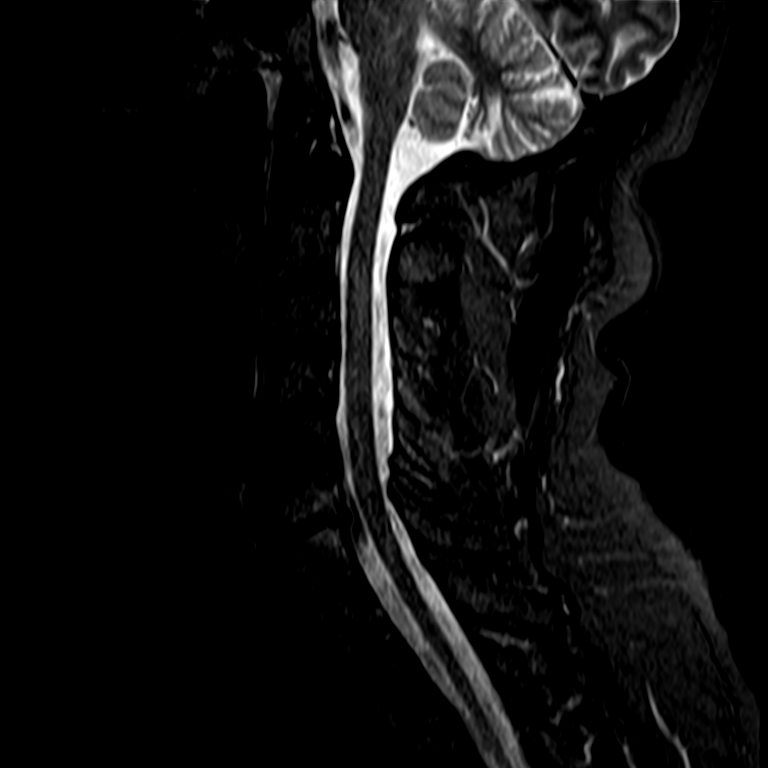
[im 15/15]
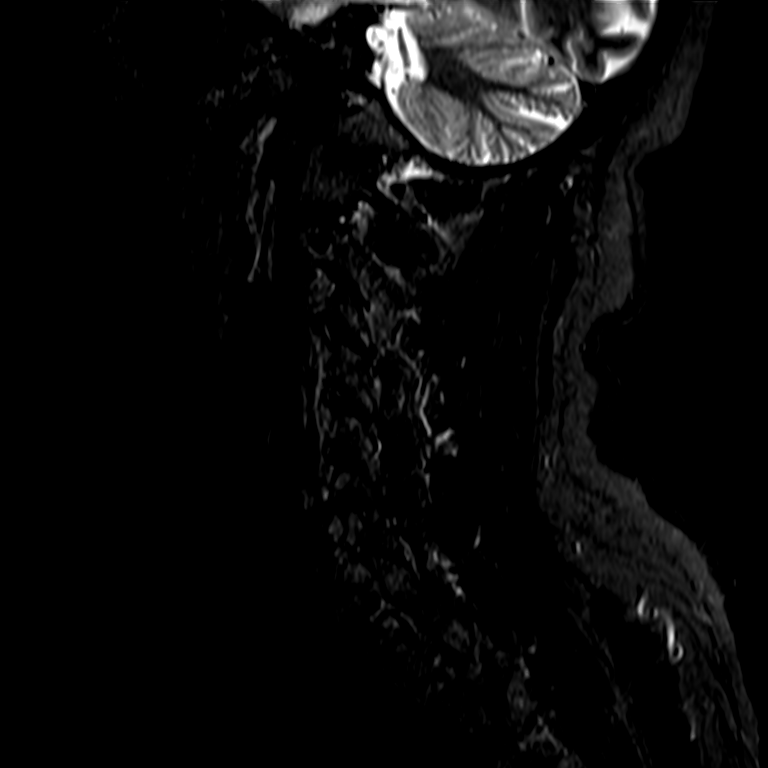

[15 of 48 positions shown; findings below may reference images not displayed]

FINDINGS: Alignment: Slight degenerative retrolisthesis is present at C5-6 and
C6-7.

Vertebrae: Chronic endplate marrow changes are most evident at C6-7.
There chronic edematous endplate marrow changes at C5-6 and C6-7.
Vertebral body heights are maintained.

Cord: Normal signal is present in cervical and upper thoracic spinal
cord to the lowest imaged level, T2-3.

Posterior Fossa, vertebral arteries, paraspinal tissues:
Craniocervical junction is normal. Flow is present in the vertebral
arteries bilaterally. Visualized intracranial contents are normal.

Disc levels:

C2-3: Asymmetric facet hypertrophy contributes to mild right
foraminal narrowing.

C3-4: Facet and uncovertebral spurring contribute to mild foraminal
narrowing bilaterally. The central canal is patent.

C4-5: Facet and uncovertebral spurring contribute to mild foraminal
narrowing bilaterally, left greater than right. The central canal is
patent.

C5-6: A broad-based disc osteophyte complex is present.
Uncovertebral spurring is noted bilaterally. Severe right and
moderate left foraminal narrowing is present. The central canal is
patent.

C6-7: A broad-based disc osteophyte complex is present.
Uncovertebral spurring is worse on the left. Severe left and
moderate right foraminal narrowing is present. There is partial
effacement of the ventral CSF.

C7-T1: Negative.
IMPRESSION: 1. Multilevel degenerative changes the cervical spine are most
significant at C5-6 and C6-7.
2. Severe right and moderate left foraminal narrowing at C5-6.
3. Severe left and moderate right foraminal narrowing at C6-7.
4. Mild central canal narrowing at C6-7.
5. Mild foraminal narrowing is present bilaterally at C2-3, C3-4,
and C4-5.

## 2019-10-25 ENCOUNTER — Other Ambulatory Visit: Payer: Self-pay | Admitting: Family Medicine

## 2019-10-25 NOTE — Telephone Encounter (Signed)
Ok to refill??  Last office visit 06/10/2019.  Last refill 07/28/2019, #2 refills.

## 2019-11-27 ENCOUNTER — Other Ambulatory Visit: Payer: Self-pay | Admitting: Family Medicine

## 2019-11-29 ENCOUNTER — Ambulatory Visit: Payer: Managed Care, Other (non HMO) | Admitting: Family Medicine

## 2019-11-29 ENCOUNTER — Other Ambulatory Visit: Payer: Self-pay

## 2019-11-29 ENCOUNTER — Encounter: Payer: Self-pay | Admitting: Family Medicine

## 2019-11-29 VITALS — BP 138/82 | HR 88 | Temp 97.4°F | Resp 16 | Ht 68.0 in | Wt 211.0 lb

## 2019-11-29 DIAGNOSIS — M545 Low back pain, unspecified: Secondary | ICD-10-CM

## 2019-11-29 LAB — URINALYSIS, ROUTINE W REFLEX MICROSCOPIC
Bilirubin Urine: NEGATIVE
Glucose, UA: NEGATIVE
Hgb urine dipstick: NEGATIVE
Ketones, ur: NEGATIVE
Leukocytes,Ua: NEGATIVE
Nitrite: NEGATIVE
Protein, ur: NEGATIVE
Specific Gravity, Urine: 1.015 (ref 1.001–1.03)
pH: 5.5 (ref 5.0–8.0)

## 2019-11-29 NOTE — Progress Notes (Signed)
Subjective:    Patient ID: [name], male    DOB: 1966/10/21, 54 y.o.   MRN: [id number]  HPI  Patient states that for the last 3 weeks he has had intermittent pain in his left flank.  The pain comes and goes and is a pressure-like sensation.  He denies any dysuria.  He denies any urgency or frequency or hematuria.  Urinalysis today is completely normal with no leukocyte esterase no nitrites and no blood.  He denies any fevers or chills.  He denies any constipation.  He denies any lumbar radiculopathy/sciatica.  He denies any diarrhea.  Pain does seem to be worse with twisting motions and bending forward when it was occurring suggesting a musculoskeletal component.  However he has been pain-free now for more than 3 days.  He denies any symptoms at the present time.  He also reports a popping sensation in his right ear.  On examination today there is approximately a 60 to 70% obstruction of the auditory canal with impacted cerumen.  Otherwise the tympanic membrane appears healthy and gray with no middle ear effusion and no evidence of otitis externa or an ear infection.  Therefore I suspect that her cerumen impaction may be causing the popping sensation due to partial occlusion of the auditory canal. Past Medical History:  Diagnosis Date  . Allergy   . Anemia   . Colonic diverticular abscess 12/26/2014  . Diverticulitis   . ETOH abuse   . GAD (generalized anxiety disorder)   . GERD (gastroesophageal reflux disease)   . Hypercholesteremia   . Hypertension   . Left rotator cuff tear 02/08/2019  . Superior labrum anterior-to-posterior (SLAP) tear of left shoulder 02/08/2019   Past Surgical History:  Procedure Laterality Date  . APPENDECTOMY N/A 01/01/2015   Procedure: INCIDENTAL APPENDECTOMY;  Surgeon: Jamesetta So, MD;  Location: [facility];  Service: General;  Laterality: N/A;  . BACK SURGERY     lower back, fusion  . COLONOSCOPY WITH PROPOFOL N/A 12/21/2017   Procedure: COLONOSCOPY WITH  PROPOFOL;  Surgeon: Daneil Dolin, MD;  [facility];  Service: Endoscopy;  Laterality: N/A;  9:45am  . GASTRECTOMY N/A 04/14/2016   Procedure: Toniann Ket;  Surgeon: Aviva Signs, MD;  Location: [facility];  Service: General;  Laterality: N/A;  . HEMORRHOID SURGERY    . HERNIA REPAIR     as child  . PARTIAL COLECTOMY N/A 01/01/2015   Procedure: PARTIAL COLECTOMY;  Surgeon: Jamesetta So, MD;  Location: [facility];  Service: General;  Laterality: N/A;  . POLYPECTOMY  12/21/2017   Procedure: POLYPECTOMY;  Surgeon: Daneil Dolin, MD;  [facility];  Service: Endoscopy;;  colon  . ROTATOR CUFF REPAIR Bilateral   . SHOULDER ARTHROSCOPY WITH ROTATOR CUFF REPAIR AND SUBACROMIAL DECOMPRESSION Left 02/09/2019   Procedure: SHOULDER ARTHROSCOPY WITH ROTATOR CUFF REPAIR AND SUBACROMIAL PARTIAL ACROMIOPLASTY, DISTAL CLAVICULECTOMY, AND EXTENSIVE DEBRIDEMENT;  Surgeon: Elsie Saas, MD;  Location: [facility];  Service: Orthopedics;  Laterality: Left;   Current Outpatient Medications on File Prior to Visit  Medication Sig Dispense Refill  . acetaminophen (TYLENOL) 500 MG tablet Take 500 mg by mouth every 6 (six) hours as needed for moderate pain or headache.    Marland Kitchen amLODipine (NORVASC) 10 MG tablet TAKE 1 TABLET (10 MG TOTAL) BY MOUTH DAILY. THIS REPLACES METOPROLOL 90 tablet 3  . atorvastatin (LIPITOR) 40 MG tablet TAKE 1 TABLET BY MOUTH EVERY DAY 90 tablet 1  . carvedilol (  COREG) 12.5 MG tablet TAKE 1 TABLET (12.5 MG TOTAL) BY MOUTH 2 (TWO) TIMES DAILY WITH A MEAL. 180 tablet 3  . clonazePAM (KLONOPIN) 0.5 MG tablet TAKE 1 TABLET BY MOUTH TWICE A DAY AS NEEDED FOR ANXIETY 30 tablet 2  . cyclobenzaprine (FLEXERIL) 10 MG tablet TAKE 1 TABLET BY MOUTH EVERYDAY AT BEDTIME 30 tablet 2  . fluticasone (FLONASE) 50 MCG/ACT nasal spray SPRAY 2 SPRAYS INTO EACH NOSTRIL EVERY DAY 48 g 3  . hydrochlorothiazide (MICROZIDE) 12.5 MG capsule TAKE 1 CAPSULE BY MOUTH EVERY DAY 90 capsule 1    . losartan (COZAAR) 50 MG tablet TAKE 1 TABLET BY MOUTH EVERY DAY 90 tablet 3  . pantoprazole (PROTONIX) 40 MG tablet Take 1 tablet (40 mg total) by mouth 2 (two) times daily. 180 tablet 3  . venlafaxine XR (EFFEXOR-XR) 150 MG 24 hr capsule TAKE ONE CAPSULE BY MOUTH IN THE MORNING 90 capsule 3   No current facility-administered medications on file prior to visit.   No Known Allergies Social History   Socioeconomic History  . Marital status: Married    Spouse name: Not on file  . Number of children: Not on file  . Years of education: Not on file  . Highest education level: Not on file  Occupational History  . Not on file  Tobacco Use  . Smoking status: Current Every Day Smoker    Packs/day: 1.00    Years: 35.00    Pack years: 35.00    Types: Cigarettes  . Smokeless tobacco: Never Used  Substance and Sexual Activity  . Alcohol use: Yes    Alcohol/week: 0.0 standard drinks    Comment: patient states "about 5 or 6 a day" (beers)  . Drug use: No  . Sexual activity: Yes  Other Topics Concern  . Not on file  Social History Narrative  . Not on file   Social Determinants of Health   Financial Resource Strain:   . Difficulty of Paying Living Expenses: Not on file  Food Insecurity:   . Worried About Charity fundraiser in the Last Year: Not on file  . Ran Out of Food in the Last Year: Not on file  Transportation Needs:   . Lack of Transportation (Medical): Not on file  . Lack of Transportation (Non-Medical): Not on file  Physical Activity:   . Days of Exercise per Week: Not on file  . Minutes of Exercise per Session: Not on file  Stress:   . Feeling of Stress : Not on file  Social Connections:   . Frequency of Communication with Friends and Family: Not on file  . Frequency of Social Gatherings with Friends and Family: Not on file  . Attends Religious Services: Not on file  . Active Member of Clubs or Organizations: Not on file  . Attends Archivist Meetings:  Not on file  . Marital Status: Not on file  Intimate Partner Violence:   . Fear of Current or Ex-Partner: Not on file  . Emotionally Abused: Not on file  . Physically Abused: Not on file  . Sexually Abused: Not on file     Review of Systems  All other systems reviewed and are negative.      Objective:   Physical Exam Constitutional:      General: He is not in acute distress.    Appearance: Normal appearance. He is not ill-appearing or toxic-appearing.  HENT:     Right Ear: Tympanic membrane and ear canal  normal.     Left Ear: Tympanic membrane and ear canal normal.  Cardiovascular:     Rate and Rhythm: Normal rate and regular rhythm.  Pulmonary:     Effort: Pulmonary effort is normal.     Breath sounds: Normal breath sounds.  Musculoskeletal:     Lumbar back: Normal. No swelling or spasms. Normal range of motion.       Back:  Neurological:     Mental Status: He is alert.           Assessment & Plan:  Acute left-sided low back pain without sciatica - Plan: Urinalysis, Routine w reflex microscopic  Urinalysis today is completely normal.  I suspect that the patient's pain was musculoskeletal in nature.  He has been pain-free now for 3 days therefore I do not feel further work-up is necessary.  I see no evidence of a kidney stone or urinary tract infection.  If pain returns he will call me back.  I believe the popping sensation in his right ear is due to partial occlusion of the auditory canal with cerumen.  I removed this easily just using a curette.  Hopefully this will help his symptoms.  I see no visible abnormality in the tympanic membrane or auditory canal on the side.

## 2020-01-06 ENCOUNTER — Ambulatory Visit (INDEPENDENT_AMBULATORY_CARE_PROVIDER_SITE_OTHER): Payer: Managed Care, Other (non HMO) | Admitting: Family Medicine

## 2020-01-06 ENCOUNTER — Other Ambulatory Visit: Payer: Self-pay

## 2020-01-06 ENCOUNTER — Encounter: Payer: Self-pay | Admitting: Family Medicine

## 2020-01-06 VITALS — BP 134/82 | HR 97 | Temp 96.9°F | Resp 16 | Ht 68.0 in | Wt 208.0 lb

## 2020-01-06 DIAGNOSIS — F331 Major depressive disorder, recurrent, moderate: Secondary | ICD-10-CM | POA: Diagnosis not present

## 2020-01-06 DIAGNOSIS — L739 Follicular disorder, unspecified: Secondary | ICD-10-CM

## 2020-01-06 DIAGNOSIS — R5383 Other fatigue: Secondary | ICD-10-CM

## 2020-01-06 DIAGNOSIS — F329 Major depressive disorder, single episode, unspecified: Secondary | ICD-10-CM | POA: Diagnosis not present

## 2020-01-06 MED ORDER — BUPROPION HCL ER (XL) 150 MG PO TB24: 150.0000 mg | ORAL_TABLET | Freq: Every day | ORAL | 2 refills | Status: DC

## 2020-01-06 MED ORDER — DOXYCYCLINE HYCLATE 100 MG PO TABS: 100.0000 mg | ORAL_TABLET | Freq: Two times a day (BID) | ORAL | 0 refills | Status: DC

## 2020-01-06 NOTE — Progress Notes (Signed)
Subjective:    Patient ID: [name], male    DOB: 06/12/1966, 54 y.o.   MRN: [id number]  HPI  Patient presents today reporting worsening depression.  [name] is currently on Effexor XR 150 mg p.o. every morning.  [name] also takes Klonopin as needed for anxiety.  Has been under increasing stress.  Patient required surgery on his neck.  [name] is currently undergoing review of his long-term disability.  [name] has chronic pain.  [name] is unable to accomplish the things that [name] wants to be able to do.  His limitation in his activities of daily living may be contributing to his depression.  His son has also been discharged from the [employer] due to a DUI.  [name] then moved home and suffered another DUI here.  His legal issues have the patient worried and concerned.  As result [name] reports feeling depressed, anxious, anhedonia.  [name] reports no energy or drive to do anything.  Is hard for him to get out of bed in the morning.  [name] lacks motivation.  [name] denies any suicidal ideation or hallucinations or symptoms of mania.  [name] does report severe fatigue. Past Medical History:  Diagnosis Date  . Allergy   . Anemia   . Colonic diverticular abscess 12/26/2014  . Diverticulitis   . ETOH abuse   . GAD (generalized anxiety disorder)   . GERD (gastroesophageal reflux disease)   . Hypercholesteremia   . Hypertension   . Left rotator cuff tear 02/08/2019  . Superior labrum anterior-to-posterior (SLAP) tear of left shoulder 02/08/2019   Past Surgical History:  Procedure Laterality Date  . APPENDECTOMY N/A 01/01/2015   Procedure: INCIDENTAL APPENDECTOMY;  Surgeon: Jamesetta So, MD;  Location: [facility];  Service: General;  Laterality: N/A;  . BACK SURGERY     lower back, fusion  . COLONOSCOPY WITH PROPOFOL N/A 12/21/2017   Procedure: COLONOSCOPY WITH PROPOFOL;  Surgeon: Daneil Dolin, MD;  [facility];  Service: Endoscopy;  Laterality: N/A;  9:45am  . GASTRECTOMY N/A 04/14/2016   Procedure: Toniann Ket;  Surgeon: Aviva Signs, MD;  Location: [facility];  Service: General;  Laterality: N/A;  . HEMORRHOID SURGERY    . HERNIA REPAIR     as child  . PARTIAL COLECTOMY N/A 01/01/2015   Procedure: PARTIAL COLECTOMY;  Surgeon: Jamesetta So, MD;  Location: [facility];  Service: General;  Laterality: N/A;  . POLYPECTOMY  12/21/2017   Procedure: POLYPECTOMY;  Surgeon: Daneil Dolin, MD;  [facility];  Service: Endoscopy;;  colon  . ROTATOR CUFF REPAIR Bilateral   . SHOULDER ARTHROSCOPY WITH ROTATOR CUFF REPAIR AND SUBACROMIAL DECOMPRESSION Left 02/09/2019   Procedure: SHOULDER ARTHROSCOPY WITH ROTATOR CUFF REPAIR AND SUBACROMIAL PARTIAL ACROMIOPLASTY, DISTAL CLAVICULECTOMY, AND EXTENSIVE DEBRIDEMENT;  Surgeon: Elsie Saas, MD;  Location: [place];  Service: Orthopedics;  Laterality: Left;   Current Outpatient Medications on File Prior to Visit  Medication Sig Dispense Refill  . acetaminophen (TYLENOL) 500 MG tablet Take 500 mg by mouth every 6 (six) hours as needed for moderate pain or headache.    Marland Kitchen amLODipine (NORVASC) 10 MG tablet TAKE 1 TABLET (10 MG TOTAL) BY MOUTH DAILY. THIS REPLACES METOPROLOL 90 tablet 3  . atorvastatin (LIPITOR) 40 MG tablet TAKE 1 TABLET BY MOUTH EVERY DAY 90 tablet 1  . carvedilol (COREG) 12.5 MG tablet TAKE 1 TABLET (12.5 MG TOTAL) BY MOUTH 2 (TWO) TIMES DAILY WITH A MEAL. 180 tablet 3  .  cholecalciferol (VITAMIN D3) 25 MCG (1000 UNIT) tablet Take 1,000 Units by mouth daily.    . clonazePAM (KLONOPIN) 0.5 MG tablet TAKE 1 TABLET BY MOUTH TWICE A DAY AS NEEDED FOR ANXIETY 30 tablet 2  . cyclobenzaprine (FLEXERIL) 10 MG tablet TAKE 1 TABLET BY MOUTH EVERYDAY AT BEDTIME 30 tablet 2  . fluticasone (FLONASE) 50 MCG/ACT nasal spray SPRAY 2 SPRAYS INTO EACH NOSTRIL EVERY DAY 48 g 3  . hydrochlorothiazide (MICROZIDE) 12.5 MG capsule TAKE 1 CAPSULE BY MOUTH EVERY DAY 90 capsule 1  . losartan (COZAAR) 50 MG tablet TAKE 1 TABLET BY MOUTH EVERY DAY 90 tablet 3  . pantoprazole  (PROTONIX) 40 MG tablet Take 1 tablet (40 mg total) by mouth 2 (two) times daily. 180 tablet 3  . venlafaxine XR (EFFEXOR-XR) 150 MG 24 hr capsule TAKE ONE CAPSULE BY MOUTH IN THE MORNING 90 capsule 3   No current facility-administered medications on file prior to visit.   No Known Allergies Social History   Socioeconomic History  . Marital status: Married    Spouse name: Not on file  . Number of children: Not on file  . Years of education: Not on file  . Highest education level: Not on file  Occupational History  . Not on file  Tobacco Use  . Smoking status: Current Every Day Smoker    Packs/day: 1.00    Years: 35.00    Pack years: 35.00    Types: Cigarettes  . Smokeless tobacco: Never Used  Substance and Sexual Activity  . Alcohol use: Yes    Alcohol/week: 0.0 standard drinks    Comment: patient states "about 5 or 6 a day" (beers)  . Drug use: No  . Sexual activity: Yes  Other Topics Concern  . Not on file  Social History Narrative  . Not on file   Social Determinants of Health   Financial Resource Strain:   . Difficulty of Paying Living Expenses: Not on file  Food Insecurity:   . Worried About Charity fundraiser in the Last Year: Not on file  . Ran Out of Food in the Last Year: Not on file  Transportation Needs:   . Lack of Transportation (Medical): Not on file  . Lack of Transportation (Non-Medical): Not on file  Physical Activity:   . Days of Exercise per Week: Not on file  . Minutes of Exercise per Session: Not on file  Stress:   . Feeling of Stress : Not on file  Social Connections:   . Frequency of Communication with Friends and Family: Not on file  . Frequency of Social Gatherings with Friends and Family: Not on file  . Attends Religious Services: Not on file  . Active Member of Clubs or Organizations: Not on file  . Attends Archivist Meetings: Not on file  . Marital Status: Not on file  Intimate Partner Violence:   . Fear of Current or  Ex-Partner: Not on file  . Emotionally Abused: Not on file  . Physically Abused: Not on file  . Sexually Abused: Not on file      Review of Systems  All other systems reviewed and are negative.      Objective:   Physical Exam Constitutional:      General: [name] is not in acute distress.    Appearance: Normal appearance. [name] is normal weight. [name] is not toxic-appearing or diaphoretic.  Cardiovascular:     Rate and Rhythm: Normal rate and regular rhythm.  Pulmonary:     Effort: Pulmonary effort is normal.     Breath sounds: Normal breath sounds.  Skin:    Findings: Rash present.  Neurological:     Mental Status: [name] is alert.  Psychiatric:        Mood and Affect: Mood normal.        Behavior: Behavior normal.        Thought Content: Thought content normal.        Judgment: Judgment normal.    Patient has pustular folliculitis on his back and on his chest.  [name] believes [name] may have had hives that [name] has been scratching at it has become infected.       Assessment & Plan:  Fatigue due to depression - Plan: CBC with Differential/Platelet, COMPLETE METABOLIC PANEL WITH GFR, TSH, Testosterone Total,Free,Bio, Males  Moderate episode of recurrent major depressive disorder (HCC)  Folliculitis  Continue venlafaxine but add Wellbutrin XL 150 mg daily in the morning.  Recheck in 4 weeks.  I will check a CBC, CMP, TSH, and testosterone level to rule out other contributing factors to his fatigue and malaise.  I will treat his folliculitis with doxycycline 100 mg p.o. twice daily for 7 days.

## 2020-01-09 ENCOUNTER — Other Ambulatory Visit: Payer: Self-pay | Admitting: Family Medicine

## 2020-01-09 DIAGNOSIS — R7989 Other specified abnormal findings of blood chemistry: Secondary | ICD-10-CM

## 2020-01-09 LAB — CBC WITH DIFFERENTIAL/PLATELET
Absolute Monocytes: 1568 cells/uL — ABNORMAL HIGH (ref 200–950)
Basophils Absolute: 101 cells/uL (ref 0–200)
Basophils Relative: 0.9 %
Eosinophils Absolute: 213 cells/uL (ref 15–500)
Eosinophils Relative: 1.9 %
HCT: 47.9 % (ref 38.5–50.0)
Hemoglobin: 16.6 g/dL (ref 13.2–17.1)
Lymphs Abs: 2150 cells/uL (ref 850–3900)
MCH: 32.2 pg (ref 27.0–33.0)
MCHC: 34.7 g/dL (ref 32.0–36.0)
MCV: 93 fL (ref 80.0–100.0)
MPV: 10.2 fL (ref 7.5–12.5)
Monocytes Relative: 14 %
Neutro Abs: 7168 cells/uL (ref 1500–7800)
Neutrophils Relative %: 64 %
Platelets: 285 10*3/uL (ref 140–400)
RBC: 5.15 10*6/uL (ref 4.20–5.80)
RDW: 12.9 % (ref 11.0–15.0)
Total Lymphocyte: 19.2 %
WBC: 11.2 10*3/uL — ABNORMAL HIGH (ref 3.8–10.8)

## 2020-01-09 LAB — TESTOSTERONE TOTAL,FREE,BIO, MALES
Albumin: 4.1 g/dL (ref 3.6–5.1)
Sex Hormone Binding: 37 nmol/L (ref 10–50)
Testosterone, Bioavailable: 85 ng/dL — ABNORMAL LOW (ref >=110.0)
Testosterone, Free: 45.2 pg/mL — ABNORMAL LOW (ref 46.0–224.0)
Testosterone: 374 ng/dL (ref 250–827)

## 2020-01-09 LAB — COMPLETE METABOLIC PANEL WITH GFR
AG Ratio: 1.4 (calc) (ref 1.0–2.5)
ALT: 95 U/L — ABNORMAL HIGH (ref 9–46)
AST: 63 U/L — ABNORMAL HIGH (ref 10–35)
Albumin: 4.1 g/dL (ref 3.6–5.1)
Alkaline phosphatase (APISO): 98 U/L (ref 35–144)
BUN: 14 mg/dL (ref 7–25)
CO2: 26 mmol/L (ref 20–32)
Calcium: 9.7 mg/dL (ref 8.6–10.3)
Chloride: 99 mmol/L (ref 98–110)
Creat: 0.88 mg/dL (ref 0.70–1.33)
GFR, Est African American: 112 mL/min/{1.73_m2} (ref >=60)
GFR, Est Non African American: 97 mL/min/{1.73_m2} (ref >=60)
Globulin: 2.9 g/dL (calc) (ref 1.9–3.7)
Glucose, Bld: 91 mg/dL (ref 65–99)
Potassium: 4.6 mmol/L (ref 3.5–5.3)
Sodium: 135 mmol/L (ref 135–146)
Total Bilirubin: 0.5 mg/dL (ref 0.2–1.2)
Total Protein: 7 g/dL (ref 6.1–8.1)

## 2020-01-09 LAB — TSH: TSH: 1.43 mIU/L (ref 0.40–4.50)

## 2020-01-13 ENCOUNTER — Other Ambulatory Visit: Payer: Self-pay | Admitting: Family Medicine

## 2020-01-13 MED ORDER — TESTOSTERONE CYPIONATE 200 MG/ML IM SOLN: 200.0000 mg | INTRAMUSCULAR | 0 refills | Status: DC

## 2020-01-14 ENCOUNTER — Other Ambulatory Visit: Payer: Self-pay | Admitting: Family Medicine

## 2020-01-16 NOTE — Telephone Encounter (Signed)
Requesting refill  Klonopin  LOV: 01/06/20  LRF:   10/27/19

## 2020-01-23 ENCOUNTER — Ambulatory Visit (INDEPENDENT_AMBULATORY_CARE_PROVIDER_SITE_OTHER): Payer: Managed Care, Other (non HMO)

## 2020-01-23 ENCOUNTER — Other Ambulatory Visit: Payer: Self-pay

## 2020-01-23 DIAGNOSIS — R7989 Other specified abnormal findings of blood chemistry: Secondary | ICD-10-CM | POA: Diagnosis not present

## 2020-01-23 MED ORDER — TESTOSTERONE CYPIONATE 200 MG/ML IM SOLN
200.0000 mg | INTRAMUSCULAR | Status: DC
  Administered 2020-01-23 – 2020-08-24 (×15): 200 mg via INTRAMUSCULAR

## 2020-01-24 ENCOUNTER — Encounter: Payer: Self-pay | Admitting: Family Medicine

## 2020-02-03 ENCOUNTER — Ambulatory Visit (INDEPENDENT_AMBULATORY_CARE_PROVIDER_SITE_OTHER): Payer: BC Managed Care – PPO

## 2020-02-03 ENCOUNTER — Other Ambulatory Visit: Payer: Self-pay

## 2020-02-03 DIAGNOSIS — R7989 Other specified abnormal findings of blood chemistry: Secondary | ICD-10-CM

## 2020-02-03 MED ORDER — TESTOSTERONE CYPIONATE 200 MG/ML IM SOLN
200.0000 mg | Freq: Once | INTRAMUSCULAR | Status: AC
  Administered 2020-02-03: 200 mg via INTRAMUSCULAR

## 2020-02-09 ENCOUNTER — Other Ambulatory Visit: Payer: Self-pay | Admitting: Family Medicine

## 2020-02-09 MED ORDER — BUPROPION HCL ER (XL) 150 MG PO TB24: 150.0000 mg | ORAL_TABLET | Freq: Every day | ORAL | 6 refills | Status: DC

## 2020-02-17 ENCOUNTER — Encounter: Payer: Self-pay | Admitting: Family Medicine

## 2020-02-17 ENCOUNTER — Other Ambulatory Visit: Payer: Self-pay

## 2020-02-17 ENCOUNTER — Ambulatory Visit: Payer: BC Managed Care – PPO | Admitting: Family Medicine

## 2020-02-17 VITALS — BP 128/74 | HR 78 | Temp 97.0°F | Resp 16 | Ht 68.0 in | Wt 218.0 lb

## 2020-02-17 DIAGNOSIS — R7989 Other specified abnormal findings of blood chemistry: Secondary | ICD-10-CM | POA: Diagnosis not present

## 2020-02-17 DIAGNOSIS — N5201 Erectile dysfunction due to arterial insufficiency: Secondary | ICD-10-CM

## 2020-02-17 MED ORDER — SILDENAFIL CITRATE 100 MG PO TABS: 50.0000 mg | ORAL_TABLET | Freq: Every day | ORAL | 11 refills | Status: DC | PRN

## 2020-02-17 NOTE — Progress Notes (Signed)
Subjective:    Patient ID: [name], male    DOB: Sep 11, 1966, 54 y.o.   MRN: [id number]  HPI Patient is having a issue with erectile dysfunction.  He states that this predated starting his antidepressants and even his diagnosis of hypogonadism.  While taking the testosterone is helping with his fatigue and energy level and also motivation he continues to battle erectile dysfunction.  He has a difficult time achieving an erection and maintaining an erection.  He has not tried Viagra or any other medication yet for erectile dysfunction. Past Medical History:  Diagnosis Date  . Allergy   . Anemia   . Colonic diverticular abscess 12/26/2014  . Diverticulitis   . ETOH abuse   . GAD (generalized anxiety disorder)   . GERD (gastroesophageal reflux disease)   . Hypercholesteremia   . Hypertension   . Left rotator cuff tear 02/08/2019  . Superior labrum anterior-to-posterior (SLAP) tear of left shoulder 02/08/2019   Past Surgical History:  Procedure Laterality Date  . APPENDECTOMY N/A 01/01/2015   Procedure: INCIDENTAL APPENDECTOMY;  Surgeon: Jamesetta So, MD;  Location: [facility];  Service: General;  Laterality: N/A;  . BACK SURGERY     lower back, fusion  . COLONOSCOPY WITH PROPOFOL N/A 12/21/2017   Procedure: COLONOSCOPY WITH PROPOFOL;  Surgeon: Daneil Dolin, MD;  [facility];  Service: Endoscopy;  Laterality: N/A;  9:45am  . GASTRECTOMY N/A 04/14/2016   Procedure: Toniann Ket;  Surgeon: Aviva Signs, MD;  Location[facility];  Service: General;  Laterality: N/A;  . HEMORRHOID SURGERY    . HERNIA REPAIR     as child  . PARTIAL COLECTOMY N/A 01/01/2015   Procedure: PARTIAL COLECTOMY;  Surgeon: Jamesetta So, MD;  Location: [facility];  Service: General;  Laterality: N/A;  . POLYPECTOMY  12/21/2017   Procedure: POLYPECTOMY;  Surgeon: Daneil Dolin, MD;  [facility];  Service: Endoscopy;;  colon  . ROTATOR CUFF REPAIR Bilateral   . SHOULDER ARTHROSCOPY WITH  ROTATOR CUFF REPAIR AND SUBACROMIAL DECOMPRESSION Left 02/09/2019   Procedure: SHOULDER ARTHROSCOPY WITH ROTATOR CUFF REPAIR AND SUBACROMIAL PARTIAL ACROMIOPLASTY, DISTAL CLAVICULECTOMY, AND EXTENSIVE DEBRIDEMENT;  Surgeon: Elsie Saas, MD;  Location: [place];  Service: Orthopedics;  Laterality: Left;   Current Outpatient Medications on File Prior to Visit  Medication Sig Dispense Refill  . acetaminophen (TYLENOL) 500 MG tablet Take 500 mg by mouth every 6 (six) hours as needed for moderate pain or headache.    Marland Kitchen amLODipine (NORVASC) 10 MG tablet TAKE 1 TABLET (10 MG TOTAL) BY MOUTH DAILY. THIS REPLACES METOPROLOL 90 tablet 3  . atorvastatin (LIPITOR) 40 MG tablet TAKE 1 TABLET BY MOUTH EVERY DAY 90 tablet 1  . buPROPion (WELLBUTRIN XL) 150 MG 24 hr tablet Take 1 tablet (150 mg total) by mouth daily. 30 tablet 6  . carvedilol (COREG) 12.5 MG tablet TAKE 1 TABLET (12.5 MG TOTAL) BY MOUTH 2 (TWO) TIMES DAILY WITH A MEAL. 180 tablet 3  . cholecalciferol (VITAMIN D3) 25 MCG (1000 UNIT) tablet Take 1,000 Units by mouth daily.    . clonazePAM (KLONOPIN) 0.5 MG tablet TAKE 1 TABLET BY MOUTH TWICE A DAY AS NEEDED FOR ANXIETY 30 tablet 2  . cyclobenzaprine (FLEXERIL) 10 MG tablet TAKE 1 TABLET BY MOUTH EVERYDAY AT BEDTIME 30 tablet 2  . doxycycline (VIBRA-TABS) 100 MG tablet Take 1 tablet (100 mg total) by mouth 2 (two) times daily. 14 tablet 0  . fluticasone (  FLONASE) 50 MCG/ACT nasal spray SPRAY 2 SPRAYS INTO EACH NOSTRIL EVERY DAY 48 g 3  . hydrochlorothiazide (MICROZIDE) 12.5 MG capsule TAKE 1 CAPSULE BY MOUTH EVERY DAY 90 capsule 1  . losartan (COZAAR) 50 MG tablet TAKE 1 TABLET BY MOUTH EVERY DAY 90 tablet 3  . pantoprazole (PROTONIX) 40 MG tablet Take 1 tablet (40 mg total) by mouth 2 (two) times daily. 180 tablet 3  . testosterone cypionate (DEPOTESTOSTERONE CYPIONATE) 200 MG/ML injection Inject 1 mL (200 mg total) into the muscle every 14 (fourteen) days. 10 mL 0  . venlafaxine  XR (EFFEXOR-XR) 150 MG 24 hr capsule TAKE ONE CAPSULE BY MOUTH IN THE MORNING 90 capsule 3   Current Facility-Administered Medications on File Prior to Visit  Medication Dose Route Frequency Provider Last Rate Last Admin  . testosterone cypionate (DEPOTESTOSTERONE CYPIONATE) injection 200 mg  200 mg Intramuscular Q14 Days Susy Frizzle, MD   200 mg at 02/17/20 1542   No Known Allergies Social History   Socioeconomic History  . Marital status: Married    Spouse name: Not on file  . Number of children: Not on file  . Years of education: Not on file  . Highest education level: Not on file  Occupational History  . Not on file  Tobacco Use  . Smoking status: Current Every Day Smoker    Packs/day: 1.00    Years: 35.00    Pack years: 35.00    Types: Cigarettes  . Smokeless tobacco: Never Used  Substance and Sexual Activity  . Alcohol use: Yes    Alcohol/week: 0.0 standard drinks    Comment: patient states "about 5 or 6 a day" (beers)  . Drug use: No  . Sexual activity: Yes  Other Topics Concern  . Not on file  Social History Narrative  . Not on file   Social Determinants of Health   Financial Resource Strain:   . Difficulty of Paying Living Expenses:   Food Insecurity:   . Worried About Charity fundraiser in the Last Year:   . Arboriculturist in the Last Year:   Transportation Needs:   . Film/video editor (Medical):   Marland Kitchen Lack of Transportation (Non-Medical):   Physical Activity:   . Days of Exercise per Week:   . Minutes of Exercise per Session:   Stress:   . Feeling of Stress :   Social Connections:   . Frequency of Communication with Friends and Family:   . Frequency of Social Gatherings with Friends and Family:   . Attends Religious Services:   . Active Member of Clubs or Organizations:   . Attends Archivist Meetings:   Marland Kitchen Marital Status:   Intimate Partner Violence:   . Fear of Current or Ex-Partner:   . Emotionally Abused:   Marland Kitchen Physically  Abused:   . Sexually Abused:       Review of Systems  All other systems reviewed and are negative.      Objective:   Physical Exam Constitutional:      General: He is not in acute distress.    Appearance: Normal appearance. He is normal weight. He is not toxic-appearing or diaphoretic.  Cardiovascular:     Rate and Rhythm: Normal rate and regular rhythm.  Pulmonary:     Effort: Pulmonary effort is normal.     Breath sounds: Normal breath sounds.  Neurological:     Mental Status: He is alert.  Psychiatric:  Mood and Affect: Mood normal.        Behavior: Behavior normal.        Thought Content: Thought content normal.        Judgment: Judgment normal.         Assessment & Plan:  Erectile dysfunction due to arterial insufficiency  Recommended the patient try Viagra 50 to 100 mg p.o. daily as needed erectile dysfunction.  Return in May for fasting lab work including a PSA, CBC, testosterone level regarding his hypogonadism.

## 2020-02-28 ENCOUNTER — Other Ambulatory Visit: Payer: Self-pay | Admitting: Family Medicine

## 2020-03-02 ENCOUNTER — Other Ambulatory Visit: Payer: Self-pay | Admitting: Family Medicine

## 2020-03-02 ENCOUNTER — Ambulatory Visit (INDEPENDENT_AMBULATORY_CARE_PROVIDER_SITE_OTHER): Payer: BC Managed Care – PPO

## 2020-03-02 ENCOUNTER — Other Ambulatory Visit: Payer: Self-pay

## 2020-03-02 DIAGNOSIS — R7989 Other specified abnormal findings of blood chemistry: Secondary | ICD-10-CM | POA: Diagnosis not present

## 2020-03-02 MED ORDER — TESTOSTERONE CYPIONATE 200 MG/ML IM SOLN: 200.0000 mg | INTRAMUSCULAR | 1 refills | Status: DC

## 2020-03-02 MED ORDER — TESTOSTERONE CYPIONATE 200 MG/ML IM SOLN: 200.0000 mg | INTRAMUSCULAR | 0 refills | Status: DC

## 2020-03-02 NOTE — Telephone Encounter (Signed)
Requested Prescriptions   Pending Prescriptions Disp Refills  . testosterone cypionate (DEPOTESTOSTERONE CYPIONATE) 200 MG/ML injection 10 mL 0    Sig: Inject 1 mL (200 mg total) into the muscle every 14 (fourteen) days.     Last OV 02/17/2020   Last written 01/13/2020

## 2020-03-06 DIAGNOSIS — M47812 Spondylosis without myelopathy or radiculopathy, cervical region: Secondary | ICD-10-CM | POA: Diagnosis not present

## 2020-03-06 DIAGNOSIS — I10 Essential (primary) hypertension: Secondary | ICD-10-CM | POA: Diagnosis not present

## 2020-03-06 DIAGNOSIS — Z981 Arthrodesis status: Secondary | ICD-10-CM | POA: Diagnosis not present

## 2020-03-06 DIAGNOSIS — M5030 Other cervical disc degeneration, unspecified cervical region: Secondary | ICD-10-CM | POA: Diagnosis not present

## 2020-03-07 ENCOUNTER — Telehealth: Payer: Self-pay | Admitting: Family Medicine

## 2020-03-07 MED ORDER — BUPROPION HCL ER (XL) 150 MG PO TB24: 150.0000 mg | ORAL_TABLET | Freq: Every day | ORAL | 3 refills | Status: DC

## 2020-03-07 NOTE — Telephone Encounter (Signed)
PA Submitted through [email or web address] and received the following:  Your information has been submitted to [place]. [employer] [place] will review the request and notify you of the determination decision directly, typically within 72 hours of receiving all information.  You will also receive your request decision electronically. To check for an update later, open this request again from your dashboard.  If [employer] [place] has not responded within the specified timeframe or if you have any questions about your PA submission, contact [facility] [place] directly at [phone].

## 2020-03-08 NOTE — Telephone Encounter (Signed)
Approved on April 14 Effective from 03/07/2020 through 11/23/2038.

## 2020-03-08 NOTE — Telephone Encounter (Signed)
Pharmacy made aware

## 2020-03-16 ENCOUNTER — Other Ambulatory Visit: Payer: Self-pay

## 2020-03-16 ENCOUNTER — Ambulatory Visit (INDEPENDENT_AMBULATORY_CARE_PROVIDER_SITE_OTHER): Payer: BC Managed Care – PPO

## 2020-03-16 DIAGNOSIS — R7989 Other specified abnormal findings of blood chemistry: Secondary | ICD-10-CM | POA: Diagnosis not present

## 2020-03-16 NOTE — Progress Notes (Signed)
Pt came in for testosterone injections. Administered in L ventrogluteal. Pt tolerated well.

## 2020-03-19 ENCOUNTER — Other Ambulatory Visit: Payer: Self-pay | Admitting: Family Medicine

## 2020-03-19 MED ORDER — SILDENAFIL CITRATE 100 MG PO TABS: 100.0000 mg | ORAL_TABLET | Freq: Every day | ORAL | 11 refills | Status: DC | PRN

## 2020-03-24 ENCOUNTER — Other Ambulatory Visit: Payer: Self-pay | Admitting: Family Medicine

## 2020-03-30 ENCOUNTER — Ambulatory Visit (INDEPENDENT_AMBULATORY_CARE_PROVIDER_SITE_OTHER): Payer: BC Managed Care – PPO

## 2020-03-30 ENCOUNTER — Other Ambulatory Visit: Payer: Self-pay

## 2020-03-30 DIAGNOSIS — R7989 Other specified abnormal findings of blood chemistry: Secondary | ICD-10-CM

## 2020-04-05 ENCOUNTER — Other Ambulatory Visit: Payer: BC Managed Care – PPO

## 2020-04-07 ENCOUNTER — Other Ambulatory Visit: Payer: Self-pay | Admitting: Family Medicine

## 2020-04-13 ENCOUNTER — Other Ambulatory Visit: Payer: BC Managed Care – PPO

## 2020-04-13 ENCOUNTER — Ambulatory Visit (INDEPENDENT_AMBULATORY_CARE_PROVIDER_SITE_OTHER): Payer: BC Managed Care – PPO

## 2020-04-13 ENCOUNTER — Other Ambulatory Visit: Payer: Self-pay

## 2020-04-13 DIAGNOSIS — R7989 Other specified abnormal findings of blood chemistry: Secondary | ICD-10-CM

## 2020-04-13 DIAGNOSIS — Z13220 Encounter for screening for lipoid disorders: Secondary | ICD-10-CM | POA: Diagnosis not present

## 2020-04-13 DIAGNOSIS — Z125 Encounter for screening for malignant neoplasm of prostate: Secondary | ICD-10-CM | POA: Diagnosis not present

## 2020-04-14 LAB — CBC WITH DIFFERENTIAL/PLATELET
Absolute Monocytes: 1628 cells/uL — ABNORMAL HIGH (ref 200–950)
Basophils Absolute: 102 cells/uL (ref 0–200)
Basophils Relative: 1.1 %
Eosinophils Absolute: 186 cells/uL (ref 15–500)
Eosinophils Relative: 2 %
HCT: 43.9 % (ref 38.5–50.0)
Hemoglobin: 14.3 g/dL (ref 13.2–17.1)
Lymphs Abs: 1962 cells/uL (ref 850–3900)
MCH: 27.6 pg (ref 27.0–33.0)
MCHC: 32.6 g/dL (ref 32.0–36.0)
MCV: 84.6 fL (ref 80.0–100.0)
MPV: 9.6 fL (ref 7.5–12.5)
Monocytes Relative: 17.5 %
Neutro Abs: 5422 cells/uL (ref 1500–7800)
Neutrophils Relative %: 58.3 %
Platelets: 348 10*3/uL (ref 140–400)
RBC: 5.19 10*6/uL (ref 4.20–5.80)
RDW: 13.1 % (ref 11.0–15.0)
Total Lymphocyte: 21.1 %
WBC: 9.3 10*3/uL (ref 3.8–10.8)

## 2020-04-14 LAB — TESTOSTERONE: Testosterone: 529 ng/dL (ref 250–827)

## 2020-04-14 LAB — LIPID PANEL
Cholesterol: 155 mg/dL (ref <200)
HDL: 50 mg/dL (ref >=40)
LDL Cholesterol (Calc): 84 mg/dL (calc)
Non-HDL Cholesterol (Calc): 105 mg/dL (calc) (ref <130)
Total CHOL/HDL Ratio: 3.1 (calc) (ref <5.0)
Triglycerides: 114 mg/dL (ref <150)

## 2020-04-14 LAB — PSA: PSA: 3.1 ng/mL (ref <=4.0)

## 2020-04-21 ENCOUNTER — Other Ambulatory Visit: Payer: Self-pay | Admitting: Family Medicine

## 2020-04-24 NOTE — Telephone Encounter (Signed)
Ok to refill??  Last office visit 01/16/2020.  Last refill 02/17/2020.

## 2020-04-27 ENCOUNTER — Ambulatory Visit (INDEPENDENT_AMBULATORY_CARE_PROVIDER_SITE_OTHER): Payer: BC Managed Care – PPO

## 2020-04-27 ENCOUNTER — Other Ambulatory Visit: Payer: Self-pay

## 2020-04-27 DIAGNOSIS — R7989 Other specified abnormal findings of blood chemistry: Secondary | ICD-10-CM | POA: Diagnosis not present

## 2020-05-11 ENCOUNTER — Ambulatory Visit (INDEPENDENT_AMBULATORY_CARE_PROVIDER_SITE_OTHER): Payer: BC Managed Care – PPO

## 2020-05-11 DIAGNOSIS — R7989 Other specified abnormal findings of blood chemistry: Secondary | ICD-10-CM

## 2020-05-11 NOTE — Progress Notes (Signed)
Pt came in for testosterone injection. Administered in L ventrogluteal. Pt tolerated well.

## 2020-05-25 ENCOUNTER — Ambulatory Visit (INDEPENDENT_AMBULATORY_CARE_PROVIDER_SITE_OTHER): Payer: BC Managed Care – PPO

## 2020-05-25 ENCOUNTER — Other Ambulatory Visit: Payer: Self-pay

## 2020-05-25 DIAGNOSIS — R7989 Other specified abnormal findings of blood chemistry: Secondary | ICD-10-CM

## 2020-05-25 NOTE — Progress Notes (Signed)
Pt came in for testosterone injection. Administered in L ventrogluteal. Pt tolerated well.

## 2020-06-08 ENCOUNTER — Other Ambulatory Visit: Payer: Self-pay

## 2020-06-08 ENCOUNTER — Ambulatory Visit (INDEPENDENT_AMBULATORY_CARE_PROVIDER_SITE_OTHER): Payer: BC Managed Care – PPO

## 2020-06-08 DIAGNOSIS — R7989 Other specified abnormal findings of blood chemistry: Secondary | ICD-10-CM

## 2020-06-08 NOTE — Progress Notes (Signed)
Pt came in for an injection. Administered in L ventrogluteal. Pt tolerated well.

## 2020-06-18 ENCOUNTER — Ambulatory Visit: Payer: BC Managed Care – PPO

## 2020-06-18 ENCOUNTER — Other Ambulatory Visit: Payer: Self-pay

## 2020-06-22 ENCOUNTER — Other Ambulatory Visit: Payer: Self-pay

## 2020-06-22 ENCOUNTER — Ambulatory Visit (INDEPENDENT_AMBULATORY_CARE_PROVIDER_SITE_OTHER): Payer: BC Managed Care – PPO | Admitting: *Deleted

## 2020-06-22 ENCOUNTER — Ambulatory Visit: Payer: BC Managed Care – PPO

## 2020-06-22 DIAGNOSIS — R7989 Other specified abnormal findings of blood chemistry: Secondary | ICD-10-CM

## 2020-06-22 DIAGNOSIS — E291 Testicular hypofunction: Secondary | ICD-10-CM

## 2020-06-22 NOTE — Progress Notes (Signed)
Patient seen in office for testosterone injection.   Tolerated IM administration well.  

## 2020-07-02 ENCOUNTER — Other Ambulatory Visit: Payer: Self-pay | Admitting: *Deleted

## 2020-07-02 MED ORDER — CYCLOBENZAPRINE HCL 10 MG PO TABS: ORAL_TABLET | ORAL | 2 refills | Status: DC

## 2020-07-07 ENCOUNTER — Other Ambulatory Visit: Payer: Self-pay | Admitting: Family Medicine

## 2020-07-10 ENCOUNTER — Encounter: Payer: Self-pay | Admitting: Internal Medicine

## 2020-07-10 DIAGNOSIS — Z981 Arthrodesis status: Secondary | ICD-10-CM | POA: Diagnosis not present

## 2020-07-10 NOTE — Telephone Encounter (Signed)
Requested Prescriptions   Pending Prescriptions Disp Refills  . clonazePAM (KLONOPIN) 0.5 MG tablet [Pharmacy Med Name: CLONAZEPAM 0.5 MG TABLET] 30 tablet 2    Sig: TAKE 1 TABLET BY MOUTH TWICE A DAY AS NEEDED FOR ANXIETY    Last OV 02/17/2020  Last written 04/24/2020

## 2020-07-13 ENCOUNTER — Ambulatory Visit (INDEPENDENT_AMBULATORY_CARE_PROVIDER_SITE_OTHER): Payer: BC Managed Care – PPO | Admitting: *Deleted

## 2020-07-13 DIAGNOSIS — E291 Testicular hypofunction: Secondary | ICD-10-CM | POA: Diagnosis not present

## 2020-07-13 DIAGNOSIS — R7989 Other specified abnormal findings of blood chemistry: Secondary | ICD-10-CM | POA: Diagnosis not present

## 2020-07-17 DIAGNOSIS — S233XXA Sprain of ligaments of thoracic spine, initial encounter: Secondary | ICD-10-CM | POA: Diagnosis not present

## 2020-07-17 DIAGNOSIS — S338XXA Sprain of other parts of lumbar spine and pelvis, initial encounter: Secondary | ICD-10-CM | POA: Diagnosis not present

## 2020-07-17 DIAGNOSIS — M47816 Spondylosis without myelopathy or radiculopathy, lumbar region: Secondary | ICD-10-CM | POA: Diagnosis not present

## 2020-07-19 DIAGNOSIS — S338XXA Sprain of other parts of lumbar spine and pelvis, initial encounter: Secondary | ICD-10-CM | POA: Diagnosis not present

## 2020-07-19 DIAGNOSIS — S233XXA Sprain of ligaments of thoracic spine, initial encounter: Secondary | ICD-10-CM | POA: Diagnosis not present

## 2020-07-19 DIAGNOSIS — M47816 Spondylosis without myelopathy or radiculopathy, lumbar region: Secondary | ICD-10-CM | POA: Diagnosis not present

## 2020-07-23 DIAGNOSIS — S338XXA Sprain of other parts of lumbar spine and pelvis, initial encounter: Secondary | ICD-10-CM | POA: Diagnosis not present

## 2020-07-23 DIAGNOSIS — M47816 Spondylosis without myelopathy or radiculopathy, lumbar region: Secondary | ICD-10-CM | POA: Diagnosis not present

## 2020-07-23 DIAGNOSIS — S233XXA Sprain of ligaments of thoracic spine, initial encounter: Secondary | ICD-10-CM | POA: Diagnosis not present

## 2020-07-24 ENCOUNTER — Ambulatory Visit: Payer: Managed Care, Other (non HMO) | Admitting: Internal Medicine

## 2020-07-26 ENCOUNTER — Other Ambulatory Visit: Payer: Self-pay

## 2020-07-26 ENCOUNTER — Ambulatory Visit (INDEPENDENT_AMBULATORY_CARE_PROVIDER_SITE_OTHER): Payer: BC Managed Care – PPO

## 2020-07-26 DIAGNOSIS — R7989 Other specified abnormal findings of blood chemistry: Secondary | ICD-10-CM

## 2020-07-26 DIAGNOSIS — E291 Testicular hypofunction: Secondary | ICD-10-CM

## 2020-07-26 DIAGNOSIS — M47816 Spondylosis without myelopathy or radiculopathy, lumbar region: Secondary | ICD-10-CM | POA: Diagnosis not present

## 2020-07-26 DIAGNOSIS — S338XXA Sprain of other parts of lumbar spine and pelvis, initial encounter: Secondary | ICD-10-CM | POA: Diagnosis not present

## 2020-07-26 DIAGNOSIS — S233XXA Sprain of ligaments of thoracic spine, initial encounter: Secondary | ICD-10-CM | POA: Diagnosis not present

## 2020-07-31 DIAGNOSIS — S233XXA Sprain of ligaments of thoracic spine, initial encounter: Secondary | ICD-10-CM | POA: Diagnosis not present

## 2020-07-31 DIAGNOSIS — S338XXA Sprain of other parts of lumbar spine and pelvis, initial encounter: Secondary | ICD-10-CM | POA: Diagnosis not present

## 2020-07-31 DIAGNOSIS — M47816 Spondylosis without myelopathy or radiculopathy, lumbar region: Secondary | ICD-10-CM | POA: Diagnosis not present

## 2020-08-02 DIAGNOSIS — M47816 Spondylosis without myelopathy or radiculopathy, lumbar region: Secondary | ICD-10-CM | POA: Diagnosis not present

## 2020-08-02 DIAGNOSIS — S338XXA Sprain of other parts of lumbar spine and pelvis, initial encounter: Secondary | ICD-10-CM | POA: Diagnosis not present

## 2020-08-02 DIAGNOSIS — S233XXA Sprain of ligaments of thoracic spine, initial encounter: Secondary | ICD-10-CM | POA: Diagnosis not present

## 2020-08-10 ENCOUNTER — Ambulatory Visit (INDEPENDENT_AMBULATORY_CARE_PROVIDER_SITE_OTHER): Payer: BC Managed Care – PPO

## 2020-08-10 ENCOUNTER — Other Ambulatory Visit: Payer: Self-pay

## 2020-08-10 DIAGNOSIS — R7989 Other specified abnormal findings of blood chemistry: Secondary | ICD-10-CM

## 2020-08-10 DIAGNOSIS — E291 Testicular hypofunction: Secondary | ICD-10-CM

## 2020-08-10 NOTE — Progress Notes (Signed)
Administered in L ventrogluteal. Pt tolerated well.

## 2020-08-11 ENCOUNTER — Other Ambulatory Visit: Payer: Self-pay | Admitting: Family Medicine

## 2020-08-11 DIAGNOSIS — I10 Essential (primary) hypertension: Secondary | ICD-10-CM

## 2020-08-13 ENCOUNTER — Other Ambulatory Visit: Payer: Self-pay | Admitting: Family Medicine

## 2020-08-13 DIAGNOSIS — R000 Tachycardia, unspecified: Secondary | ICD-10-CM

## 2020-08-14 DIAGNOSIS — M47816 Spondylosis without myelopathy or radiculopathy, lumbar region: Secondary | ICD-10-CM | POA: Diagnosis not present

## 2020-08-14 DIAGNOSIS — S338XXA Sprain of other parts of lumbar spine and pelvis, initial encounter: Secondary | ICD-10-CM | POA: Diagnosis not present

## 2020-08-14 DIAGNOSIS — S233XXA Sprain of ligaments of thoracic spine, initial encounter: Secondary | ICD-10-CM | POA: Diagnosis not present

## 2020-08-16 DIAGNOSIS — S338XXA Sprain of other parts of lumbar spine and pelvis, initial encounter: Secondary | ICD-10-CM | POA: Diagnosis not present

## 2020-08-16 DIAGNOSIS — M47816 Spondylosis without myelopathy or radiculopathy, lumbar region: Secondary | ICD-10-CM | POA: Diagnosis not present

## 2020-08-16 DIAGNOSIS — S233XXA Sprain of ligaments of thoracic spine, initial encounter: Secondary | ICD-10-CM | POA: Diagnosis not present

## 2020-08-17 ENCOUNTER — Ambulatory Visit: Payer: Managed Care, Other (non HMO) | Admitting: Internal Medicine

## 2020-08-22 DIAGNOSIS — S233XXA Sprain of ligaments of thoracic spine, initial encounter: Secondary | ICD-10-CM | POA: Diagnosis not present

## 2020-08-22 DIAGNOSIS — S338XXA Sprain of other parts of lumbar spine and pelvis, initial encounter: Secondary | ICD-10-CM | POA: Diagnosis not present

## 2020-08-22 DIAGNOSIS — M47816 Spondylosis without myelopathy or radiculopathy, lumbar region: Secondary | ICD-10-CM | POA: Diagnosis not present

## 2020-08-24 ENCOUNTER — Ambulatory Visit (INDEPENDENT_AMBULATORY_CARE_PROVIDER_SITE_OTHER): Payer: BC Managed Care – PPO | Admitting: *Deleted

## 2020-08-24 ENCOUNTER — Other Ambulatory Visit: Payer: Self-pay

## 2020-08-24 DIAGNOSIS — E291 Testicular hypofunction: Secondary | ICD-10-CM | POA: Diagnosis not present

## 2020-08-24 DIAGNOSIS — R7989 Other specified abnormal findings of blood chemistry: Secondary | ICD-10-CM

## 2020-08-28 ENCOUNTER — Other Ambulatory Visit: Payer: Self-pay

## 2020-08-28 ENCOUNTER — Ambulatory Visit (INDEPENDENT_AMBULATORY_CARE_PROVIDER_SITE_OTHER): Payer: BC Managed Care – PPO | Admitting: Internal Medicine

## 2020-08-28 ENCOUNTER — Encounter: Payer: Self-pay | Admitting: Internal Medicine

## 2020-08-28 VITALS — BP 144/86 | HR 86 | Temp 97.7°F | Ht 68.0 in | Wt 217.0 lb

## 2020-08-28 DIAGNOSIS — K641 Second degree hemorrhoids: Secondary | ICD-10-CM | POA: Diagnosis not present

## 2020-08-28 NOTE — Patient Instructions (Signed)
Avoid straining.  Limit toilet time to 5 minutes  Call with any interim problems  Schedule followup appointment in 6 weeks from now

## 2020-08-28 NOTE — Progress Notes (Signed)
[place] banding procedure note:  The patient presents with symptomatic grade 2 hemorrhoids; history of banding of the left lateral right posterior and anterior hemorrhoid columns a few years ago.  Resolution of all symptoms until recently when his experience of mild paper hematochezia itching and burning.;  Colonoscopy revealed adenoma which was removed in 2019; due for surveillance 2024.  We talked about the pros and cons of anoscopy and repeat banding today.  He would like what ever can be done today definitively to be done including banding., unresponsive to maximal medical therapy, requesting rubber band ligation of his/her hemorrhoidal disease. All risks, benefits, and alternative forms of therapy were described and informed consent was obtained.  In the left lateral decubitus position, DRE revealed small grade 2 hemorrhoid columns otherwise negative.  Anoscopy revealed a prominent right posterior and left lateral hemorrhoid column. The decision was made to band the right posterior internal hemorrhoid and left lateral column;   the [place] was used to perform band ligation without complication. Digital anorectal examination was then performed to assure proper positioning of the band, and to adjust the banded tissue as required.  No pinching or pain noted.  1 band placed on each column;  the patient was discharged home without pain or other issues. Dietary and behavioral recommendations were given a  No complications were encountered and the patient tolerated the procedure well.  Office visit in 6 weeks.  Surveillance colonoscopy 2014.

## 2020-08-29 DIAGNOSIS — S338XXA Sprain of other parts of lumbar spine and pelvis, initial encounter: Secondary | ICD-10-CM | POA: Diagnosis not present

## 2020-08-29 DIAGNOSIS — S233XXA Sprain of ligaments of thoracic spine, initial encounter: Secondary | ICD-10-CM | POA: Diagnosis not present

## 2020-08-29 DIAGNOSIS — M47816 Spondylosis without myelopathy or radiculopathy, lumbar region: Secondary | ICD-10-CM | POA: Diagnosis not present

## 2020-09-05 DIAGNOSIS — S338XXA Sprain of other parts of lumbar spine and pelvis, initial encounter: Secondary | ICD-10-CM | POA: Diagnosis not present

## 2020-09-05 DIAGNOSIS — S233XXA Sprain of ligaments of thoracic spine, initial encounter: Secondary | ICD-10-CM | POA: Diagnosis not present

## 2020-09-05 DIAGNOSIS — M47816 Spondylosis without myelopathy or radiculopathy, lumbar region: Secondary | ICD-10-CM | POA: Diagnosis not present

## 2020-09-19 DIAGNOSIS — S338XXA Sprain of other parts of lumbar spine and pelvis, initial encounter: Secondary | ICD-10-CM | POA: Diagnosis not present

## 2020-09-19 DIAGNOSIS — M47816 Spondylosis without myelopathy or radiculopathy, lumbar region: Secondary | ICD-10-CM | POA: Diagnosis not present

## 2020-09-19 DIAGNOSIS — S233XXA Sprain of ligaments of thoracic spine, initial encounter: Secondary | ICD-10-CM | POA: Diagnosis not present

## 2020-10-02 ENCOUNTER — Other Ambulatory Visit: Payer: Self-pay

## 2020-10-02 MED ORDER — CYCLOBENZAPRINE HCL 10 MG PO TABS: ORAL_TABLET | ORAL | 2 refills | Status: AC

## 2020-10-03 DIAGNOSIS — S233XXA Sprain of ligaments of thoracic spine, initial encounter: Secondary | ICD-10-CM | POA: Diagnosis not present

## 2020-10-03 DIAGNOSIS — M47816 Spondylosis without myelopathy or radiculopathy, lumbar region: Secondary | ICD-10-CM | POA: Diagnosis not present

## 2020-10-03 DIAGNOSIS — S338XXA Sprain of other parts of lumbar spine and pelvis, initial encounter: Secondary | ICD-10-CM | POA: Diagnosis not present

## 2020-10-12 ENCOUNTER — Ambulatory Visit: Payer: BC Managed Care – PPO | Admitting: Internal Medicine

## 2020-10-20 ENCOUNTER — Other Ambulatory Visit: Payer: Self-pay | Admitting: Family Medicine

## 2020-10-22 NOTE — Telephone Encounter (Signed)
Ok to refill??  Last office visit 02/17/2020.  Last refill 07/10/2020, #2 refills.

## 2020-10-31 DIAGNOSIS — S233XXA Sprain of ligaments of thoracic spine, initial encounter: Secondary | ICD-10-CM | POA: Diagnosis not present

## 2020-10-31 DIAGNOSIS — M47816 Spondylosis without myelopathy or radiculopathy, lumbar region: Secondary | ICD-10-CM | POA: Diagnosis not present

## 2020-10-31 DIAGNOSIS — S338XXA Sprain of other parts of lumbar spine and pelvis, initial encounter: Secondary | ICD-10-CM | POA: Diagnosis not present

## 2020-11-07 ENCOUNTER — Other Ambulatory Visit: Payer: Self-pay | Admitting: Family Medicine

## 2020-11-07 DIAGNOSIS — R000 Tachycardia, unspecified: Secondary | ICD-10-CM

## 2020-11-07 DIAGNOSIS — I10 Essential (primary) hypertension: Secondary | ICD-10-CM

## 2020-11-19 ENCOUNTER — Other Ambulatory Visit: Payer: Self-pay | Admitting: Family Medicine

## 2020-11-20 NOTE — Telephone Encounter (Signed)
Ok to refill??  Last office visit 02/17/2020.  Last refill 03/02/2020.

## 2020-11-28 DIAGNOSIS — S233XXA Sprain of ligaments of thoracic spine, initial encounter: Secondary | ICD-10-CM | POA: Diagnosis not present

## 2020-11-28 DIAGNOSIS — M47816 Spondylosis without myelopathy or radiculopathy, lumbar region: Secondary | ICD-10-CM | POA: Diagnosis not present

## 2020-11-28 DIAGNOSIS — S338XXA Sprain of other parts of lumbar spine and pelvis, initial encounter: Secondary | ICD-10-CM | POA: Diagnosis not present

## 2020-12-26 DIAGNOSIS — S233XXA Sprain of ligaments of thoracic spine, initial encounter: Secondary | ICD-10-CM | POA: Diagnosis not present

## 2020-12-26 DIAGNOSIS — S338XXA Sprain of other parts of lumbar spine and pelvis, initial encounter: Secondary | ICD-10-CM | POA: Diagnosis not present

## 2020-12-26 DIAGNOSIS — M47816 Spondylosis without myelopathy or radiculopathy, lumbar region: Secondary | ICD-10-CM | POA: Diagnosis not present

## 2021-01-19 ENCOUNTER — Other Ambulatory Visit: Payer: Self-pay | Admitting: Family Medicine

## 2021-01-21 NOTE — Telephone Encounter (Signed)
ntbs

## 2021-01-21 NOTE — Telephone Encounter (Signed)
Ok to refill??  Last office visit 02/17/2020.  Last refill 10/22/2020, #2 refills.

## 2021-01-22 NOTE — Telephone Encounter (Signed)
Letter sent.

## 2021-01-23 DIAGNOSIS — S338XXA Sprain of other parts of lumbar spine and pelvis, initial encounter: Secondary | ICD-10-CM | POA: Diagnosis not present

## 2021-01-23 DIAGNOSIS — M47816 Spondylosis without myelopathy or radiculopathy, lumbar region: Secondary | ICD-10-CM | POA: Diagnosis not present

## 2021-01-23 DIAGNOSIS — S233XXA Sprain of ligaments of thoracic spine, initial encounter: Secondary | ICD-10-CM | POA: Diagnosis not present

## 2021-01-30 ENCOUNTER — Other Ambulatory Visit: Payer: Self-pay | Admitting: Family Medicine

## 2021-01-30 DIAGNOSIS — I10 Essential (primary) hypertension: Secondary | ICD-10-CM

## 2021-01-30 DIAGNOSIS — R000 Tachycardia, unspecified: Secondary | ICD-10-CM

## 2021-01-31 ENCOUNTER — Encounter: Payer: Self-pay | Admitting: Family Medicine

## 2021-01-31 ENCOUNTER — Ambulatory Visit: Payer: BC Managed Care – PPO | Admitting: Family Medicine

## 2021-01-31 ENCOUNTER — Other Ambulatory Visit: Payer: Self-pay

## 2021-01-31 VITALS — BP 142/84 | HR 90 | Temp 98.2°F | Resp 14 | Ht 68.0 in | Wt 210.0 lb

## 2021-01-31 DIAGNOSIS — N401 Enlarged prostate with lower urinary tract symptoms: Secondary | ICD-10-CM

## 2021-01-31 DIAGNOSIS — E7800 Pure hypercholesterolemia, unspecified: Secondary | ICD-10-CM | POA: Diagnosis not present

## 2021-01-31 DIAGNOSIS — F411 Generalized anxiety disorder: Secondary | ICD-10-CM

## 2021-01-31 DIAGNOSIS — I10 Essential (primary) hypertension: Secondary | ICD-10-CM | POA: Diagnosis not present

## 2021-01-31 MED ORDER — FLUTICASONE PROPIONATE 50 MCG/ACT NA SUSP: NASAL | 3 refills | Status: DC

## 2021-01-31 NOTE — Progress Notes (Signed)
Subjective:    Patient ID: [name], male    DOB: [birth year], 55 y.o.   MRN: [id number]  Patient is a very pleasant 55 year old Caucasian male here today for a checkup.  He has a history of hypertension.  He takes amlodipine, carvedilol, losartan, hydrochlorothiazide.  His blood pressure today is still elevated at 142/84 but I believe some of this is due to anxiety.  He has some element of whitecoat syndrome.  He is not been checking his blood pressure at home.  He denies any chest pain, shortness of breath, dyspnea on exertion.  He continues to smoke.  He has smoked a pack of cigarettes a day since he was a teenager there for almost 40 years.  He would qualify for lung cancer screening.  We discussed this at length today but he defers that at the present time.  His colonoscopy is up-to-date.  His PSA will be due again in May.  He does have a history of an elevated PSA greater than 3 and he is also having some lower urinary tract symptoms with increased frequency and decreased stream.  He would like to recheck the PSA earlier today.  He also has a history of anxiety.  He is taken a combination of bupropion and Effexor for this.  He uses Klonopin perhaps once a day for anxiety or to help him sleep.  I have no concern about habituation and dependency in this individual or abuse.  However I did caution the patient that continued use can lead to dependency.  I encouraged him to try to use it as sparingly as possible.  Overall he is doing well with no concerns. Past Medical History:  Diagnosis Date  . Allergy   . Anemia   . Colonic diverticular abscess 12/26/2014  . Diverticulitis   . ETOH abuse   . GAD (generalized anxiety disorder)   . GERD (gastroesophageal reflux disease)   . Hypercholesteremia   . Hypertension   . Left rotator cuff tear 02/08/2019  . Superior labrum anterior-to-posterior (SLAP) tear of left shoulder 02/08/2019   Past Surgical History:  Procedure Laterality Date  .  APPENDECTOMY N/A 01/01/2015   Procedure: INCIDENTAL APPENDECTOMY;  Surgeon: Jamesetta So, MD;  Location: [facility];  Service: General;  Laterality: N/A;  . BACK SURGERY     lower back, fusion  . COLONOSCOPY WITH PROPOFOL N/A 12/21/2017   Procedure: COLONOSCOPY WITH PROPOFOL;  Surgeon: Daneil Dolin, MD;  [facility];  Service: Endoscopy;  Laterality: N/A;  9:45am  . GASTRECTOMY N/A 04/14/2016   Procedure: Toniann Ket;  Surgeon: Aviva Signs, MD;  Location: [facility];  Service: General;  Laterality: N/A;  . HEMORRHOID SURGERY    . HERNIA REPAIR     as child  . PARTIAL COLECTOMY N/A 01/01/2015   Procedure: PARTIAL COLECTOMY;  Surgeon: Jamesetta So, MD;  Location: [facility];  Service: General;  Laterality: N/A;  . POLYPECTOMY  12/21/2017   Procedure: POLYPECTOMY;  Surgeon: Daneil Dolin, MD;  [facility];  Service: Endoscopy;;  colon  . ROTATOR CUFF REPAIR Bilateral   . SHOULDER ARTHROSCOPY WITH ROTATOR CUFF REPAIR AND SUBACROMIAL DECOMPRESSION Left 02/09/2019   Procedure: SHOULDER ARTHROSCOPY WITH ROTATOR CUFF REPAIR AND SUBACROMIAL PARTIAL ACROMIOPLASTY, DISTAL CLAVICULECTOMY, AND EXTENSIVE DEBRIDEMENT;  Surgeon: Elsie Saas, MD;  Location: [place];  Service: Orthopedics;  Laterality: Left;   Current Outpatient Medications on File Prior to Visit  Medication Sig Dispense Refill  . acetaminophen (  TYLENOL) 500 MG tablet Take 500 mg by mouth every 6 (six) hours as needed for moderate pain or headache.    Marland Kitchen amLODipine (NORVASC) 10 MG tablet TAKE 1 TABLET (10 MG TOTAL) BY MOUTH DAILY. THIS REPLACES METOPROLOL 90 tablet 0  . atorvastatin (LIPITOR) 40 MG tablet TAKE 1 TABLET BY MOUTH EVERY DAY 90 tablet 1  . buPROPion (WELLBUTRIN XL) 150 MG 24 hr tablet Take 150 mg by mouth daily.    . carvedilol (COREG) 12.5 MG tablet TAKE 1 TABLET BY MOUTH 2 TIMES DAILY WITH A MEAL. 180 tablet 0  . clonazePAM (KLONOPIN) 0.5 MG tablet TAKE 1 TABLET BY MOUTH TWICE A DAY AS  NEEDED FOR ANXIETY 30 tablet 2  . hydrochlorothiazide (MICROZIDE) 12.5 MG capsule TAKE 1 CAPSULE BY MOUTH EVERY DAY 90 capsule 0  . losartan (COZAAR) 50 MG tablet TAKE 1 TABLET BY MOUTH EVERY DAY 90 tablet 3  . sildenafil (VIAGRA) 100 MG tablet Take 1 tablet (100 mg total) by mouth daily as needed for erectile dysfunction. 20 tablet 11  . venlafaxine XR (EFFEXOR-XR) 150 MG 24 hr capsule TAKE 1 CAPSULE BY MOUTH EVERY DAY IN THE MORNING 90 capsule 3  . cyclobenzaprine (FLEXERIL) 10 MG tablet TAKE 1 TABLET BY MOUTH EVERYDAY AT BEDTIME (Patient not taking: Reported on 01/31/2021) 30 tablet 2   No current facility-administered medications on file prior to visit.   No Known Allergies Social History   Socioeconomic History  . Marital status: Married    Spouse name: Not on file  . Number of children: Not on file  . Years of education: Not on file  . Highest education level: Not on file  Occupational History  . Not on file  Tobacco Use  . Smoking status: Current Every Day Smoker    Packs/day: 1.00    Years: 35.00    Pack years: 35.00    Types: Cigarettes  . Smokeless tobacco: Never Used  Vaping Use  . Vaping Use: Never used  Substance and Sexual Activity  . Alcohol use: Yes    Alcohol/week: 0.0 standard drinks    Comment: patient states "about 5 or 6 a day" (beers)  . Drug use: No  . Sexual activity: Yes  Other Topics Concern  . Not on file  Social History Narrative  . Not on file   Social Determinants of Health   Financial Resource Strain: Not on file  Food Insecurity: Not on file  Transportation Needs: Not on file  Physical Activity: Not on file  Stress: Not on file  Social Connections: Not on file  Intimate Partner Violence: Not on file      Review of Systems  All other systems reviewed and are negative.      Objective:   Physical Exam Vitals reviewed.  Constitutional:      Appearance: He is well-developed.  Eyes:     Conjunctiva/sclera: Conjunctivae normal.      Pupils: Pupils are equal, round, and reactive to light.  Cardiovascular:     Rate and Rhythm: Normal rate and regular rhythm.     Heart sounds: Normal heart sounds. No murmur heard.   Pulmonary:     Effort: Pulmonary effort is normal. No respiratory distress.     Breath sounds: Normal breath sounds. No wheezing or rales.  Abdominal:     General: Bowel sounds are normal. There is no distension.     Palpations: Abdomen is soft.     Tenderness: There is no abdominal tenderness. There  is no guarding or rebound.  Musculoskeletal:     Cervical back: Neck supple.  Lymphadenopathy:     Cervical: No cervical adenopathy.  Neurological:     Mental Status: He is alert and oriented to person, place, and time.     Cranial Nerves: No cranial nerve deficit.     Motor: No abnormal muscle tone.     Coordination: Coordination normal.     Deep Tendon Reflexes: Reflexes are normal and symmetric.           Assessment & Plan:   Benign essential HTN - Plan: CBC with Differential/Platelet, COMPLETE METABOLIC PANEL WITH GFR, Lipid panel  Pure hypercholesterolemia - Plan: CBC with Differential/Platelet, COMPLETE METABOLIC PANEL WITH GFR, Lipid panel  Benign localized prostatic hyperplasia with lower urinary tract symptoms (LUTS) - Plan: PSA  GAD (generalized anxiety disorder)  Since I last saw the patient, he is receive disability and is no longer working.  He seems to be doing better now.  His anxiety seems to be better controlled.  He is only having to take the Klonopin once a day.  I encouraged him to try to use it more sparingly if possible.  Blood pressure today is elevated and of asked him to start checking it at home because I would like to see the systolic blood pressure less than 140.  I will check a fasting lipid panel today.  Goal LDL cholesterol is less than 100.  I will also repeat a PSA today while the patient is here given his increasing lower urinary tract symptoms.  I encourage  smoking cessation.  Also discussed with him the potential to screen for lung cancer with a low-dose CT scan once a year.  He would like to defer lung cancer screening with a CT scan at the present time.  He is in the precontemplative phase regarding smoking cessation.  Regarding his anxiety, the bupropion and the venlafaxine seem to be working well.  Plus I feel that he is doing better now that he is no longer having to put up with stress at work.

## 2021-02-01 LAB — COMPLETE METABOLIC PANEL WITH GFR
AG Ratio: 1.4 (calc) (ref 1.0–2.5)
ALT: 68 U/L — ABNORMAL HIGH (ref 9–46)
AST: 47 U/L — ABNORMAL HIGH (ref 10–35)
Albumin: 4.2 g/dL (ref 3.6–5.1)
Alkaline phosphatase (APISO): 92 U/L (ref 35–144)
BUN: 13 mg/dL (ref 7–25)
CO2: 27 mmol/L (ref 20–32)
Calcium: 10 mg/dL (ref 8.6–10.3)
Chloride: 102 mmol/L (ref 98–110)
Creat: 0.78 mg/dL (ref 0.70–1.33)
GFR, Est African American: 117 mL/min/{1.73_m2} (ref >=60)
GFR, Est Non African American: 101 mL/min/{1.73_m2} (ref >=60)
Globulin: 2.9 g/dL (calc) (ref 1.9–3.7)
Glucose, Bld: 103 mg/dL — ABNORMAL HIGH (ref 65–99)
Potassium: 4.7 mmol/L (ref 3.5–5.3)
Sodium: 140 mmol/L (ref 135–146)
Total Bilirubin: 0.3 mg/dL (ref 0.2–1.2)
Total Protein: 7.1 g/dL (ref 6.1–8.1)

## 2021-02-01 LAB — CBC WITH DIFFERENTIAL/PLATELET
Absolute Monocytes: 1178 cells/uL — ABNORMAL HIGH (ref 200–950)
Basophils Absolute: 110 cells/uL (ref 0–200)
Basophils Relative: 1.2 %
Eosinophils Absolute: 294 cells/uL (ref 15–500)
Eosinophils Relative: 3.2 %
HCT: 47.4 % (ref 38.5–50.0)
Hemoglobin: 15.2 g/dL (ref 13.2–17.1)
Lymphs Abs: 2134 cells/uL (ref 850–3900)
MCH: 27.3 pg (ref 27.0–33.0)
MCHC: 32.1 g/dL (ref 32.0–36.0)
MCV: 85.3 fL (ref 80.0–100.0)
MPV: 9.7 fL (ref 7.5–12.5)
Monocytes Relative: 12.8 %
Neutro Abs: 5483 cells/uL (ref 1500–7800)
Neutrophils Relative %: 59.6 %
Platelets: 347 10*3/uL (ref 140–400)
RBC: 5.56 10*6/uL (ref 4.20–5.80)
RDW: 15.3 % — ABNORMAL HIGH (ref 11.0–15.0)
Total Lymphocyte: 23.2 %
WBC: 9.2 10*3/uL (ref 3.8–10.8)

## 2021-02-01 LAB — PSA: PSA: 3.33 ng/mL (ref <=4.0)

## 2021-02-01 LAB — LIPID PANEL
Cholesterol: 190 mg/dL (ref <200)
HDL: 54 mg/dL (ref >=40)
LDL Cholesterol (Calc): 111 mg/dL (calc) — ABNORMAL HIGH
Non-HDL Cholesterol (Calc): 136 mg/dL (calc) — ABNORMAL HIGH (ref <130)
Total CHOL/HDL Ratio: 3.5 (calc) (ref <5.0)
Triglycerides: 137 mg/dL (ref <150)

## 2021-02-14 ENCOUNTER — Encounter: Payer: Self-pay | Admitting: *Deleted

## 2021-03-06 DIAGNOSIS — S233XXA Sprain of ligaments of thoracic spine, initial encounter: Secondary | ICD-10-CM | POA: Diagnosis not present

## 2021-03-06 DIAGNOSIS — S338XXA Sprain of other parts of lumbar spine and pelvis, initial encounter: Secondary | ICD-10-CM | POA: Diagnosis not present

## 2021-03-06 DIAGNOSIS — M47816 Spondylosis without myelopathy or radiculopathy, lumbar region: Secondary | ICD-10-CM | POA: Diagnosis not present

## 2021-03-11 ENCOUNTER — Other Ambulatory Visit: Payer: Self-pay

## 2021-03-11 MED ORDER — VENLAFAXINE HCL ER 150 MG PO CP24: ORAL_CAPSULE | ORAL | 3 refills | Status: DC

## 2021-03-21 ENCOUNTER — Other Ambulatory Visit: Payer: Self-pay | Admitting: *Deleted

## 2021-03-21 DIAGNOSIS — R000 Tachycardia, unspecified: Secondary | ICD-10-CM

## 2021-03-21 DIAGNOSIS — I10 Essential (primary) hypertension: Secondary | ICD-10-CM

## 2021-03-21 MED ORDER — BUPROPION HCL ER (XL) 150 MG PO TB24: 150.0000 mg | ORAL_TABLET | Freq: Every day | ORAL | 3 refills | Status: DC

## 2021-03-21 MED ORDER — LOSARTAN POTASSIUM 50 MG PO TABS: 1.0000 | ORAL_TABLET | Freq: Every day | ORAL | 3 refills | Status: DC

## 2021-03-21 MED ORDER — CARVEDILOL 12.5 MG PO TABS: 12.5000 mg | ORAL_TABLET | Freq: Two times a day (BID) | ORAL | 3 refills | Status: DC

## 2021-03-21 MED ORDER — AMLODIPINE BESYLATE 10 MG PO TABS: 10.0000 mg | ORAL_TABLET | Freq: Every day | ORAL | 3 refills | Status: DC

## 2021-04-01 ENCOUNTER — Other Ambulatory Visit: Payer: Self-pay | Admitting: Family Medicine

## 2021-04-01 MED ORDER — SILDENAFIL CITRATE 100 MG PO TABS: 100.0000 mg | ORAL_TABLET | Freq: Every day | ORAL | 11 refills | Status: DC | PRN

## 2021-04-26 ENCOUNTER — Other Ambulatory Visit: Payer: Self-pay

## 2021-04-26 ENCOUNTER — Other Ambulatory Visit: Payer: Self-pay | Admitting: Family Medicine

## 2021-04-26 MED ORDER — CLONAZEPAM 0.5 MG PO TABS: ORAL_TABLET | ORAL | 2 refills | Status: DC

## 2021-05-09 ENCOUNTER — Ambulatory Visit: Payer: BC Managed Care – PPO | Admitting: Family Medicine

## 2021-05-09 ENCOUNTER — Encounter: Payer: Self-pay | Admitting: Family Medicine

## 2021-05-09 ENCOUNTER — Other Ambulatory Visit: Payer: Self-pay

## 2021-05-09 VITALS — BP 102/68 | HR 86 | Temp 98.1°F | Resp 14 | Ht 68.0 in | Wt 209.0 lb

## 2021-05-09 DIAGNOSIS — B351 Tinea unguium: Secondary | ICD-10-CM

## 2021-05-09 DIAGNOSIS — R109 Unspecified abdominal pain: Secondary | ICD-10-CM

## 2021-05-09 LAB — URINALYSIS, ROUTINE W REFLEX MICROSCOPIC
Bacteria, UA: NONE SEEN /HPF
Bilirubin Urine: NEGATIVE
Glucose, UA: NEGATIVE
Hgb urine dipstick: NEGATIVE
Hyaline Cast: NONE SEEN /LPF
Ketones, ur: NEGATIVE
Leukocytes,Ua: NEGATIVE
Nitrite: NEGATIVE
RBC / HPF: NONE SEEN /HPF (ref 0–2)
Specific Gravity, Urine: 1.015 (ref 1.001–1.035)
Squamous Epithelial / HPF: NONE SEEN /HPF (ref <=5)
WBC, UA: NONE SEEN /HPF (ref 0–5)
pH: 6.5 (ref 5.0–8.0)

## 2021-05-09 LAB — MICROSCOPIC MESSAGE

## 2021-05-09 MED ORDER — TERBINAFINE HCL 250 MG PO TABS: 250.0000 mg | ORAL_TABLET | Freq: Every day | ORAL | 2 refills | Status: DC

## 2021-05-09 NOTE — Progress Notes (Signed)
Subjective:    Patient ID: [name], male    DOB: 1966/10/04, 55 y.o.   MRN: [id number]  Patient originally made the appointment last week when he developed left flank pain.  It began suddenly without reason.  This began on Thursday.  Friday it was intense causing him to make the appointment.  By Saturday and it improved.  He has not had any pain now for more than 5 days.  He thought he may have a bladder infection however urinalysis today shows no blood, no nitrates, and no leukocyte esterase.  He denies any dysuria or hematuria or urgency or frequency or hesitancy.  He denies any fevers or chills.  He also denies any constipation or melena or hematochezia.  Today there is no tenderness to palpation in his left lower quadrant or in his abdomen.  He does have a palpable muscle spasm in his left flank and in his left lower back.  I believe the pain was more likely musculoskeletal.  He states that at any rate, the pain has been resolved now for 5 days and he feels better so no treatment is necessary.  However he does have onychomycosis on the radial side of his left third fingernail.  The entire fingernail is thick yellow and dystrophic.  It is causing him pain where it is ingrowing. Past Medical History:  Diagnosis Date   Allergy    Anemia    Colonic diverticular abscess 12/26/2014   Diverticulitis    ETOH abuse    GAD (generalized anxiety disorder)    GERD (gastroesophageal reflux disease)    Hypercholesteremia    Hypertension    Left rotator cuff tear 02/08/2019   Superior labrum anterior-to-posterior (SLAP) tear of left shoulder 02/08/2019   Past Surgical History:  Procedure Laterality Date   APPENDECTOMY N/A 01/01/2015   Procedure: INCIDENTAL APPENDECTOMY;  Surgeon: Jamesetta So, MD;  Location: [facility];  Service: General;  Laterality: N/A;   BACK SURGERY     lower back, fusion   COLONOSCOPY WITH PROPOFOL N/A 12/21/2017   Procedure: COLONOSCOPY WITH PROPOFOL;  Surgeon: Daneil Dolin, MD;  [facility];  Service: Endoscopy;  Laterality: N/A;  9:45am   GASTRECTOMY N/A 04/14/2016   Procedure: GASTRORRHAPHY;  Surgeon: Aviva Signs, MD;  Location: [facility];  Service: General;  Laterality: N/A;   [place]     as child   PARTIAL COLECTOMY N/A 01/01/2015   Procedure: PARTIAL COLECTOMY;  Surgeon: Jamesetta So, MD;  Location: [facility];  Service: General;  Laterality: N/A;   POLYPECTOMY  12/21/2017   Procedure: POLYPECTOMY;  Surgeon: Daneil Dolin, MD;  [facility];  Service: Endoscopy;;  colon   ROTATOR CUFF REPAIR Bilateral    SHOULDER ARTHROSCOPY WITH ROTATOR CUFF REPAIR AND SUBACROMIAL DECOMPRESSION Left 02/09/2019   Procedure: SHOULDER ARTHROSCOPY WITH ROTATOR CUFF REPAIR AND SUBACROMIAL PARTIAL ACROMIOPLASTY, DISTAL CLAVICULECTOMY, AND EXTENSIVE DEBRIDEMENT;  Surgeon: Elsie Saas, MD;  Location: [facility];  Service: Orthopedics;  Laterality: Left;   Current Outpatient Medications on File Prior to Visit  Medication Sig Dispense Refill   acetaminophen (TYLENOL) 500 MG tablet Take 500 mg by mouth every 6 (six) hours as needed for moderate pain or headache.     amLODipine (NORVASC) 10 MG tablet Take 1 tablet (10 mg total) by mouth daily. 90 tablet 3   atorvastatin (LIPITOR) 40 MG tablet TAKE 1 TABLET BY MOUTH EVERY DAY 90  tablet 1   carvedilol (COREG) 12.5 MG tablet Take 1 tablet (12.5 mg total) by mouth 2 (two) times daily with a meal. 180 tablet 3   clonazePAM (KLONOPIN) 0.5 MG tablet TAKE 1 TABLET BY MOUTH TWICE A DAY AS NEEDED FOR ANXIETY 30 tablet 2   cyclobenzaprine (FLEXERIL) 10 MG tablet TAKE 1 TABLET BY MOUTH EVERYDAY AT BEDTIME 30 tablet 2   fluticasone (FLONASE) 50 MCG/ACT nasal spray SPRAY 2 SPRAYS INTO EACH NOSTRIL EVERY DAY 48 g 3   hydrochlorothiazide (MICROZIDE) 12.5 MG capsule TAKE 1 CAPSULE BY MOUTH EVERY DAY 90 capsule 0   losartan (COZAAR) 50 MG tablet Take 1 tablet (50 mg total) by mouth  daily. 90 tablet 3   sildenafil (VIAGRA) 100 MG tablet Take 1 tablet (100 mg total) by mouth daily as needed for erectile dysfunction. 20 tablet 11   venlafaxine XR (EFFEXOR-XR) 150 MG 24 hr capsule TAKE 1 CAPSULE BY MOUTH EVERY DAY IN THE MORNING 90 capsule 3   No current facility-administered medications on file prior to visit.   No Known Allergies Social History   Socioeconomic History   Marital status: Married    Spouse name: Not on file   Number of children: Not on file   Years of education: Not on file   Highest education level: Not on file  Occupational History   Not on file  Tobacco Use   Smoking status: Every Day    Packs/day: 1.00    Years: 35.00    Pack years: 35.00    Types: Cigarettes   Smokeless tobacco: Never  Vaping Use   Vaping Use: Never used  Substance and Sexual Activity   Alcohol use: Yes    Alcohol/week: 0.0 standard drinks    Comment: patient states "about 5 or 6 a day" (beers)   Drug use: No   Sexual activity: Yes  Other Topics Concern   Not on file  Social History Narrative   Not on file   Social Determinants of Health   Financial Resource Strain: Not on file  Food Insecurity: Not on file  Transportation Needs: Not on file  Physical Activity: Not on file  Stress: Not on file  Social Connections: Not on file  Intimate Partner Violence: Not on file      Review of Systems  All other systems reviewed and are negative.     Objective:   Physical Exam Vitals reviewed.  Constitutional:      Appearance: He is well-developed.  Eyes:     Conjunctiva/sclera: Conjunctivae normal.     Pupils: Pupils are equal, round, and reactive to light.  Cardiovascular:     Rate and Rhythm: Normal rate and regular rhythm.     Heart sounds: Normal heart sounds. No murmur heard. Pulmonary:     Effort: Pulmonary effort is normal. No respiratory distress.     Breath sounds: Normal breath sounds. No wheezing or rales.  Abdominal:     General: Bowel sounds  are normal. There is no distension.     Palpations: Abdomen is soft.     Tenderness: There is no abdominal tenderness. There is no guarding or rebound.  Musculoskeletal:     Left hand: Deformity present.       Hands:     Cervical back: Neck supple.     Lumbar back: Spasms and tenderness present.       Back:  Lymphadenopathy:     Cervical: No cervical adenopathy.  Neurological:     Mental  Status: He is alert and oriented to person, place, and time.     Cranial Nerves: No cranial nerve deficit.     Motor: No abnormal muscle tone.     Coordination: Coordination normal.     Deep Tendon Reflexes: Reflexes are normal and symmetric.          Assessment & Plan:   Flank pain - Plan: Urinalysis, Routine w reflex microscopic  Onychomycosis Pain in his left flank has resolved.  Urinalysis shows no evidence of urinary tract infection and history does not support any type of intestinal etiology.  He denies any dysuria or hematuria now and is completely pain-free.  Therefore no further work-up is necessary unless the pain returns.  I will treat the onychomycosis on his left third fingernail with Lamisil 250 mg p.o. daily for a total of 3 months if necessary.  I recommended that we check liver function test every 30 days while taking the medication.  He also discussed options to treat his rosacea.  I defer this until after he is off the Lamisil but we can certainly try doxycycline

## 2021-06-07 ENCOUNTER — Other Ambulatory Visit: Payer: BC Managed Care – PPO

## 2021-07-14 ENCOUNTER — Other Ambulatory Visit: Payer: Self-pay | Admitting: Family Medicine

## 2021-08-29 ENCOUNTER — Other Ambulatory Visit: Payer: Self-pay

## 2021-08-29 ENCOUNTER — Ambulatory Visit: Payer: BC Managed Care – PPO | Admitting: Family Medicine

## 2021-08-29 ENCOUNTER — Encounter: Payer: Self-pay | Admitting: Family Medicine

## 2021-08-29 VITALS — BP 138/78 | HR 72 | Temp 97.8°F | Resp 16 | Ht 68.0 in | Wt 217.0 lb

## 2021-08-29 DIAGNOSIS — L739 Follicular disorder, unspecified: Secondary | ICD-10-CM | POA: Diagnosis not present

## 2021-08-29 DIAGNOSIS — R079 Chest pain, unspecified: Secondary | ICD-10-CM

## 2021-08-29 DIAGNOSIS — M461 Sacroiliitis, not elsewhere classified: Secondary | ICD-10-CM | POA: Diagnosis not present

## 2021-08-29 MED ORDER — PANTOPRAZOLE SODIUM 40 MG PO TBEC: 40.0000 mg | DELAYED_RELEASE_TABLET | Freq: Every day | ORAL | 3 refills | Status: DC

## 2021-08-29 MED ORDER — DOXYCYCLINE HYCLATE 100 MG PO TABS
100.0000 mg | ORAL_TABLET | Freq: Two times a day (BID) | ORAL | 0 refills | Status: DC
Start: 2021-08-29 — End: 2022-03-07

## 2021-08-29 NOTE — Progress Notes (Signed)
Subjective:    Patient ID: [name], male    DOB: Jun 02, 1966, 55 y.o.   MRN: [id number]  Patient presents with 3 concerns today.  First over the last several months has been getting chest pain.  He states that the pain is a pressure-like sensation in his left chest.  It will develop and last for a minute or so and then gradually subside.  It has no relationship to exercise or exertion.  It occurs randomly.  He is also been having more heartburn recently.  About a week ago it was happening on a daily basis so he started taking Prilosec in the heartburn has totally gone away even at the over-the-counter strength.  He is also not noticed a grabbing pain in his chest since that time.  He denies any angina.  He denies any shortness of breath or radiating pain.  Patient also has small 1 mm pustules on his chest and on his back.  There is approximately 10 on his chest with erythematous base and 1 to 2 mm pustules.  He has a similar distribution on his back.  They are not in a dermatomal pattern.  They have been occurring off and on over the last month.  Last he is having pain around his tailbone.  The pain is located at the SI joints bilaterally.  He complains of bilateral posterior hip pain there is no radiation of the pain.  He is received SI joint injections before which helped substantially Past Medical History:  Diagnosis Date   Allergy    Anemia    Colonic diverticular abscess 12/26/2014   Diverticulitis    ETOH abuse    GAD (generalized anxiety disorder)    GERD (gastroesophageal reflux disease)    Hypercholesteremia    Hypertension    Left rotator cuff tear 02/08/2019   Superior labrum anterior-to-posterior (SLAP) tear of left shoulder 02/08/2019   Past Surgical History:  Procedure Laterality Date   APPENDECTOMY N/A 01/01/2015   Procedure: INCIDENTAL APPENDECTOMY;  Surgeon: Jamesetta So, MD;  [facility];  Service: General;  Laterality: N/A;   BACK SURGERY     lower back, fusion    COLONOSCOPY WITH PROPOFOL N/A 12/21/2017   Procedure: COLONOSCOPY WITH PROPOFOL;  Surgeon: Daneil Dolin, MD;  [facility];  Service: Endoscopy;  Laterality: N/A;  9:45am   GASTRECTOMY N/A 04/14/2016   Procedure: GASTRORRHAPHY;  Surgeon: Aviva Signs, MD;  [facility];  Service: General;  Laterality: N/A;   [place]     as child   PARTIAL COLECTOMY N/A 01/01/2015   Procedure: PARTIAL COLECTOMY;  Surgeon: Jamesetta So, MD;  [facility];  Service: General;  Laterality: N/A;   POLYPECTOMY  12/21/2017   Procedure: POLYPECTOMY;  Surgeon: Daneil Dolin, MD;  [facility];  Service: Endoscopy;;  colon   ROTATOR CUFF REPAIR Bilateral    SHOULDER ARTHROSCOPY WITH ROTATOR CUFF REPAIR AND SUBACROMIAL DECOMPRESSION Left 02/09/2019   Procedure: SHOULDER ARTHROSCOPY WITH ROTATOR CUFF REPAIR AND SUBACROMIAL PARTIAL ACROMIOPLASTY, DISTAL CLAVICULECTOMY, AND EXTENSIVE DEBRIDEMENT;  Surgeon: Elsie Saas, MD;  Location: [place];  Service: Orthopedics;  Laterality: Left;   Current Outpatient Medications on File Prior to Visit  Medication Sig Dispense Refill   acetaminophen (TYLENOL) 500 MG tablet Take 500 mg by mouth every 6 (six) hours as needed for moderate pain or headache.     amLODipine (NORVASC) 10 MG tablet Take  1 tablet (10 mg total) by mouth daily. 90 tablet 3   atorvastatin (LIPITOR) 40 MG tablet TAKE 1 TABLET BY MOUTH EVERY DAY 90 tablet 1   carvedilol (COREG) 12.5 MG tablet Take 1 tablet (12.5 mg total) by mouth 2 (two) times daily with a meal. 180 tablet 3   clonazePAM (KLONOPIN) 0.5 MG tablet TAKE 1 TABLET BY MOUTH TWICE A DAY AS NEEDED FOR ANXIETY 30 tablet 2   cyclobenzaprine (FLEXERIL) 10 MG tablet TAKE 1 TABLET BY MOUTH EVERYDAY AT BEDTIME 30 tablet 2   fluticasone (FLONASE) 50 MCG/ACT nasal spray SPRAY 2 SPRAYS INTO EACH NOSTRIL EVERY DAY 48 g 3   losartan (COZAAR) 50 MG tablet Take 1 tablet (50 mg total) by  mouth daily. 90 tablet 3   sildenafil (VIAGRA) 100 MG tablet Take 1 tablet (100 mg total) by mouth daily as needed for erectile dysfunction. 20 tablet 11   venlafaxine XR (EFFEXOR-XR) 150 MG 24 hr capsule TAKE 1 CAPSULE BY MOUTH EVERY DAY IN THE MORNING 90 capsule 3   hydrochlorothiazide (MICROZIDE) 12.5 MG capsule TAKE 1 CAPSULE BY MOUTH EVERY DAY 90 capsule 0   No current facility-administered medications on file prior to visit.   No Known Allergies Social History   Socioeconomic History   Marital status: Married    Spouse name: Not on file   Number of children: Not on file   Years of education: Not on file   Highest education level: Not on file  Occupational History   Not on file  Tobacco Use   Smoking status: Every Day    Packs/day: 1.00    Years: 35.00    Pack years: 35.00    Types: Cigarettes   Smokeless tobacco: Never  Vaping Use   Vaping Use: Never used  Substance and Sexual Activity   Alcohol use: Yes    Alcohol/week: 0.0 standard drinks    Comment: patient states "about 5 or 6 a day" (beers)   Drug use: No   Sexual activity: Yes  Other Topics Concern   Not on file  Social History Narrative   Not on file   Social Determinants of Health   Financial Resource Strain: Not on file  Food Insecurity: Not on file  Transportation Needs: Not on file  Physical Activity: Not on file  Stress: Not on file  Social Connections: Not on file  Intimate Partner Violence: Not on file      Review of Systems  All other systems reviewed and are negative.     Objective:   Physical Exam Vitals reviewed.  Constitutional:      Appearance: He is well-developed.  Eyes:     Conjunctiva/sclera: Conjunctivae normal.     Pupils: Pupils are equal, round, and reactive to light.  Cardiovascular:     Rate and Rhythm: Normal rate and regular rhythm.     Heart sounds: Normal heart sounds. No murmur heard. Pulmonary:     Effort: Pulmonary effort is normal. No respiratory distress.      Breath sounds: Normal breath sounds. No wheezing or rales.  Abdominal:     General: Bowel sounds are normal. There is no distension.     Palpations: Abdomen is soft.     Tenderness: There is no abdominal tenderness. There is no guarding or rebound.  Musculoskeletal:     Cervical back: Neck supple.     Lumbar back: Tenderness present. No spasms. Decreased range of motion.       Back:  Lymphadenopathy:  Cervical: No cervical adenopathy.  Skin:    Findings: Rash present. Rash is pustular.       Neurological:     Mental Status: He is alert and oriented to person, place, and time.     Cranial Nerves: No cranial nerve deficit.     Motor: No abnormal muscle tone.     Coordination: Coordination normal.     Deep Tendon Reflexes: Reflexes are normal and symmetric.          Assessment & Plan:   Chest pain, unspecified type  Sacroiliitis (HCC)  Folliculitis Chest pain is very atypical.  I feel that is most likely GERD.  Begin Protonix 40 mg daily.  Meanwhile consult cardiology for possible stress test given his risk factors.  However I suspect the pain will subside on Protonix.  Consult orthopedics for possible SI joint injections.  I want to avoid NSAIDs due to problem #1.  Begin doxycycline 100 mg p.o. twice daily for 14 days for folliculitis.

## 2021-09-03 ENCOUNTER — Other Ambulatory Visit: Payer: Self-pay

## 2021-09-03 ENCOUNTER — Ambulatory Visit (INDEPENDENT_AMBULATORY_CARE_PROVIDER_SITE_OTHER): Payer: BC Managed Care – PPO | Admitting: Orthopaedic Surgery

## 2021-09-03 ENCOUNTER — Encounter: Payer: Self-pay | Admitting: Orthopaedic Surgery

## 2021-09-03 ENCOUNTER — Ambulatory Visit: Payer: BC Managed Care – PPO

## 2021-09-03 VITALS — BP 149/95 | HR 80 | Ht 68.0 in | Wt 217.4 lb

## 2021-09-03 DIAGNOSIS — M461 Sacroiliitis, not elsewhere classified: Secondary | ICD-10-CM | POA: Diagnosis not present

## 2021-09-03 NOTE — Progress Notes (Signed)
Subjective:    Patient ID: [name], male    DOB: 1965-12-21, 55 y.o.   MRN: [id number]  HPI He has history of sacral pain at the sacral iliac joints in the past.  He has had injections done at [facility] and [facility] offices in [place] in the past.  He has been seen recently by [facility].  I have reviewed their notes.  He is not taking anything for this now.  His pain has gotten worse over the last several months.  He has no trauma, no weakness.  He would like to have the injections again. I will arrange this.  He has GERD and cannot take NSAIDs.  He had surgery of lower lumbar by Dr. Carloyn Manner in [place] about six or seven years ago.   Review of Systems  Constitutional:  Positive for activity change.  Musculoskeletal:  Positive for arthralgias and back pain.  All other systems reviewed and are negative. For Review of Systems, all other systems reviewed and are negative.  The following is a summary of the past history medically, past history surgically, known current medicines, social history and family history.  This information is gathered electronically by the computer from prior information and documentation.  I review this each visit and have found including this information at this point in the chart is beneficial and informative.   Past Medical History:  Diagnosis Date   Allergy    Anemia    Colonic diverticular abscess 12/26/2014   Diverticulitis    ETOH abuse    GAD (generalized anxiety disorder)    GERD (gastroesophageal reflux disease)    Hypercholesteremia    Hypertension    Left rotator cuff tear 02/08/2019   Superior labrum anterior-to-posterior (SLAP) tear of left shoulder 02/08/2019    Past Surgical History:  Procedure Laterality Date   APPENDECTOMY N/A 01/01/2015   Procedure: INCIDENTAL APPENDECTOMY;  Surgeon: Jamesetta So, MD;  Location: [facility];  Service: General;  Laterality: N/A;   BACK SURGERY     lower back, fusion   COLONOSCOPY WITH  PROPOFOL N/A 12/21/2017   Procedure: COLONOSCOPY WITH PROPOFOL;  Surgeon: Daneil Dolin, MD;  [facility];  Service: Endoscopy;  Laterality: N/A;  9:45am   GASTRECTOMY N/A 04/14/2016   Procedure: GASTRORRHAPHY;  Surgeon: Aviva Signs, MD;  Location: [facility];  Service: General;  Laterality: N/A;   [place]     as child   PARTIAL COLECTOMY N/A 01/01/2015   Procedure: PARTIAL COLECTOMY;  Surgeon: Jamesetta So, MD;  Location: [facility];  Service: General;  Laterality: N/A;   POLYPECTOMY  12/21/2017   Procedure: POLYPECTOMY;  Surgeon: Daneil Dolin, MD;  [facility];  Service: Endoscopy;;  colon   ROTATOR CUFF REPAIR Bilateral    SHOULDER ARTHROSCOPY WITH ROTATOR CUFF REPAIR AND SUBACROMIAL DECOMPRESSION Left 02/09/2019   Procedure: SHOULDER ARTHROSCOPY WITH ROTATOR CUFF REPAIR AND SUBACROMIAL PARTIAL ACROMIOPLASTY, DISTAL CLAVICULECTOMY, AND EXTENSIVE DEBRIDEMENT;  Surgeon: Elsie Saas, MD;  Location: [facility];  Service: Orthopedics;  Laterality: Left;    Current Outpatient Medications on File Prior to Visit  Medication Sig Dispense Refill   acetaminophen (TYLENOL) 500 MG tablet Take 500 mg by mouth every 6 (six) hours as needed for moderate pain or headache.     amLODipine (NORVASC) 10 MG tablet Take 1 tablet (10 mg total) by mouth daily. 90 tablet 3   atorvastatin (LIPITOR) 40 MG tablet  TAKE 1 TABLET BY MOUTH EVERY DAY 90 tablet 1   carvedilol (COREG) 12.5 MG tablet Take 1 tablet (12.5 mg total) by mouth 2 (two) times daily with a meal. 180 tablet 3   clonazePAM (KLONOPIN) 0.5 MG tablet TAKE 1 TABLET BY MOUTH TWICE A DAY AS NEEDED FOR ANXIETY 30 tablet 2   cyclobenzaprine (FLEXERIL) 10 MG tablet TAKE 1 TABLET BY MOUTH EVERYDAY AT BEDTIME 30 tablet 2   doxycycline (VIBRA-TABS) 100 MG tablet Take 1 tablet (100 mg total) by mouth 2 (two) times daily. 28 tablet 0   fluticasone (FLONASE) 50 MCG/ACT nasal spray SPRAY 2 SPRAYS INTO  EACH NOSTRIL EVERY DAY 48 g 3   hydrochlorothiazide (MICROZIDE) 12.5 MG capsule TAKE 1 CAPSULE BY MOUTH EVERY DAY 90 capsule 0   losartan (COZAAR) 50 MG tablet Take 1 tablet (50 mg total) by mouth daily. 90 tablet 3   pantoprazole (PROTONIX) 40 MG tablet Take 1 tablet (40 mg total) by mouth daily. 30 tablet 3   sildenafil (VIAGRA) 100 MG tablet Take 1 tablet (100 mg total) by mouth daily as needed for erectile dysfunction. 20 tablet 11   venlafaxine XR (EFFEXOR-XR) 150 MG 24 hr capsule TAKE 1 CAPSULE BY MOUTH EVERY DAY IN THE MORNING 90 capsule 3   No current facility-administered medications on file prior to visit.    Social History   Socioeconomic History   Marital status: Married    Spouse name: Not on file   Number of children: Not on file   Years of education: Not on file   Highest education level: Not on file  Occupational History   Not on file  Tobacco Use   Smoking status: Every Day    Packs/day: 1.00    Years: 35.00    Pack years: 35.00    Types: Cigarettes   Smokeless tobacco: Never  Vaping Use   Vaping Use: Never used  Substance and Sexual Activity   Alcohol use: Yes    Alcohol/week: 0.0 standard drinks    Comment: patient states "about 5 or 6 a day" (beers)   Drug use: No   Sexual activity: Yes  Other Topics Concern   Not on file  Social History Narrative   Not on file   Social Determinants of Health   Financial Resource Strain: Not on file  Food Insecurity: Not on file  Transportation Needs: Not on file  Physical Activity: Not on file  Stress: Not on file  Social Connections: Not on file  Intimate Partner Violence: Not on file    Family History  Problem Relation Age of Onset   Stroke Mother    CAD Father    Colon cancer Neg Hx     BP (!) 149/95   Pulse 80   Ht 5\' 8"  (1.727 m)   Wt 217 lb 6.4 oz (98.6 kg)   BMI 33.06 kg/m   Body mass index is 33.06 kg/m.     Objective:   Physical Exam Vitals and nursing note reviewed. Exam conducted  with a chaperone present.  Constitutional:      Appearance: He is well-developed.  HENT:     Head: Normocephalic and atraumatic.  Eyes:     Conjunctiva/sclera: Conjunctivae normal.     Pupils: Pupils are equal, round, and reactive to light.  Cardiovascular:     Rate and Rhythm: Normal rate and regular rhythm.  Pulmonary:     Effort: Pulmonary effort is normal.  Abdominal:     Palpations: Abdomen  is soft.  Musculoskeletal:     Cervical back: Normal range of motion and neck supple.       Back:  Skin:    General: Skin is warm and dry.  Neurological:     Mental Status: He is alert and oriented to person, place, and time.     Cranial Nerves: No cranial nerve deficit.     Motor: No abnormal muscle tone.     Coordination: Coordination normal.     Deep Tendon Reflexes: Reflexes are normal and symmetric. Reflexes normal.  Psychiatric:        Behavior: Behavior normal.        Thought Content: Thought content normal.        Judgment: Judgment normal.   X-rays of pelvis done, reported separately.        Assessment & Plan:   Encounter Diagnosis  Name Primary?   Bilateral sacroiliitis ([place]) Yes   I will arrange for him to have the injections.    He can be followed by them.  I will see as needed.  Call if any problem.  Precautions discussed.  Electronically Signed Sanjuana Kava, MD 10/11/20221:53 PM

## 2021-09-05 ENCOUNTER — Observation Stay (HOSPITAL_COMMUNITY)
Admission: EM | Admit: 2021-09-05 | Discharge: 2021-09-06 | Disposition: A | Discharge status: Home / Self Care | Payer: BC Managed Care – PPO | Attending: Emergency Medicine | Admitting: Emergency Medicine

## 2021-09-05 ENCOUNTER — Emergency Department (HOSPITAL_COMMUNITY): Payer: BC Managed Care – PPO

## 2021-09-05 ENCOUNTER — Other Ambulatory Visit: Payer: Self-pay

## 2021-09-05 DIAGNOSIS — S0990XA Unspecified injury of head, initial encounter: Secondary | ICD-10-CM

## 2021-09-05 DIAGNOSIS — Z20822 Contact with and (suspected) exposure to covid-19: Secondary | ICD-10-CM | POA: Diagnosis not present

## 2021-09-05 DIAGNOSIS — R072 Precordial pain: Secondary | ICD-10-CM

## 2021-09-05 DIAGNOSIS — F1010 Alcohol abuse, uncomplicated: Secondary | ICD-10-CM

## 2021-09-05 DIAGNOSIS — R0602 Shortness of breath: Secondary | ICD-10-CM | POA: Diagnosis not present

## 2021-09-05 DIAGNOSIS — I2510 Atherosclerotic heart disease of native coronary artery without angina pectoris: Secondary | ICD-10-CM | POA: Insufficient documentation

## 2021-09-05 DIAGNOSIS — F32A Depression, unspecified: Secondary | ICD-10-CM | POA: Diagnosis not present

## 2021-09-05 DIAGNOSIS — F17210 Nicotine dependence, cigarettes, uncomplicated: Secondary | ICD-10-CM | POA: Diagnosis not present

## 2021-09-05 DIAGNOSIS — R269 Unspecified abnormalities of gait and mobility: Secondary | ICD-10-CM | POA: Diagnosis not present

## 2021-09-05 DIAGNOSIS — I10 Essential (primary) hypertension: Secondary | ICD-10-CM | POA: Diagnosis not present

## 2021-09-05 DIAGNOSIS — Z7982 Long term (current) use of aspirin: Secondary | ICD-10-CM | POA: Diagnosis not present

## 2021-09-05 DIAGNOSIS — R0789 Other chest pain: Secondary | ICD-10-CM | POA: Diagnosis not present

## 2021-09-05 DIAGNOSIS — R079 Chest pain, unspecified: Secondary | ICD-10-CM

## 2021-09-05 DIAGNOSIS — F411 Generalized anxiety disorder: Secondary | ICD-10-CM | POA: Diagnosis not present

## 2021-09-05 DIAGNOSIS — E785 Hyperlipidemia, unspecified: Secondary | ICD-10-CM | POA: Diagnosis not present

## 2021-09-05 DIAGNOSIS — K219 Gastro-esophageal reflux disease without esophagitis: Secondary | ICD-10-CM | POA: Diagnosis not present

## 2021-09-05 DIAGNOSIS — Z79899 Other long term (current) drug therapy: Secondary | ICD-10-CM | POA: Insufficient documentation

## 2021-09-05 DIAGNOSIS — R55 Syncope and collapse: Principal | ICD-10-CM

## 2021-09-05 HISTORY — DX: Tobacco use: Z72.0

## 2021-09-05 LAB — CBC
HCT: 47.2 % (ref 39.0–52.0)
Hemoglobin: 15.2 g/dL (ref 13.0–17.0)
MCH: 29.1 pg (ref 26.0–34.0)
MCHC: 32.2 g/dL (ref 30.0–36.0)
MCV: 90.4 fL (ref 80.0–100.0)
Platelets: 256 10*3/uL (ref 150–400)
RBC: 5.22 MIL/uL (ref 4.22–5.81)
RDW: 14.8 % (ref 11.5–15.5)
WBC: 9.1 10*3/uL (ref 4.0–10.5)
nRBC: 0 % (ref 0.0–0.2)

## 2021-09-05 LAB — URINALYSIS, ROUTINE W REFLEX MICROSCOPIC
Bilirubin Urine: NEGATIVE
Glucose, UA: NEGATIVE mg/dL
Hgb urine dipstick: NEGATIVE
Ketones, ur: NEGATIVE mg/dL
Leukocytes,Ua: NEGATIVE
Nitrite: NEGATIVE
Protein, ur: NEGATIVE mg/dL
Specific Gravity, Urine: 1.013 (ref 1.005–1.030)
pH: 5 (ref 5.0–8.0)

## 2021-09-05 LAB — BASIC METABOLIC PANEL
Anion gap: 9 (ref 5–15)
BUN: 11 mg/dL (ref 6–20)
CO2: 27 mmol/L (ref 22–32)
Calcium: 9.5 mg/dL (ref 8.9–10.3)
Chloride: 99 mmol/L (ref 98–111)
Creatinine, Ser: 0.78 mg/dL (ref 0.61–1.24)
GFR, Estimated: >60 mL/min (ref >60)
Glucose, Bld: 102 mg/dL — ABNORMAL HIGH (ref 70–99)
Potassium: 4.9 mmol/L (ref 3.5–5.1)
Sodium: 135 mmol/L (ref 135–145)

## 2021-09-05 LAB — RESP PANEL BY RT-PCR (FLU A&B, COVID) ARPGX2
Influenza A by PCR: NEGATIVE
Influenza B by PCR: NEGATIVE
SARS Coronavirus 2 by RT PCR: NEGATIVE

## 2021-09-05 LAB — TROPONIN I (HIGH SENSITIVITY): Troponin I (High Sensitivity): 5 ng/L (ref <18)

## 2021-09-05 MED ORDER — ONDANSETRON HCL 4 MG/2ML IJ SOLN: 4.0000 mg | Freq: Four times a day (QID) | INTRAMUSCULAR | Status: DC | PRN

## 2021-09-05 MED ORDER — CYCLOBENZAPRINE HCL 10 MG PO TABS
10.0000 mg | ORAL_TABLET | Freq: Every day | ORAL | Status: DC
  Administered 2021-09-05: 10 mg via ORAL
  Filled 2021-09-05: qty 1

## 2021-09-05 MED ORDER — ACETAMINOPHEN 325 MG PO TABS
650.0000 mg | ORAL_TABLET | ORAL | Status: DC | PRN
  Administered 2021-09-06: 650 mg via ORAL
  Filled 2021-09-05: qty 2

## 2021-09-05 MED ORDER — ENOXAPARIN SODIUM 40 MG/0.4ML IJ SOSY
40.0000 mg | PREFILLED_SYRINGE | INTRAMUSCULAR | Status: DC
  Administered 2021-09-06: 40 mg via SUBCUTANEOUS
  Filled 2021-09-05: qty 0.4

## 2021-09-05 MED ORDER — LOSARTAN POTASSIUM 50 MG PO TABS
50.0000 mg | ORAL_TABLET | Freq: Every day | ORAL | Status: DC
  Administered 2021-09-06: 50 mg via ORAL
  Filled 2021-09-05: qty 1

## 2021-09-05 MED ORDER — AMLODIPINE BESYLATE 10 MG PO TABS
10.0000 mg | ORAL_TABLET | Freq: Every day | ORAL | Status: DC
  Administered 2021-09-06: 10 mg via ORAL
  Filled 2021-09-05: qty 1

## 2021-09-05 MED ORDER — FLUTICASONE PROPIONATE 50 MCG/ACT NA SUSP
2.0000 | Freq: Every day | NASAL | Status: DC | PRN
  Filled 2021-09-05: qty 16

## 2021-09-05 MED ORDER — VENLAFAXINE HCL ER 150 MG PO CP24
150.0000 mg | ORAL_CAPSULE | Freq: Every morning | ORAL | Status: DC
  Administered 2021-09-06: 150 mg via ORAL
  Filled 2021-09-05 (×2): qty 1

## 2021-09-05 MED ORDER — THIAMINE HCL 100 MG/ML IJ SOLN: 100.0000 mg | Freq: Every day | INTRAMUSCULAR | Status: DC

## 2021-09-05 MED ORDER — CARVEDILOL 12.5 MG PO TABS
12.5000 mg | ORAL_TABLET | Freq: Two times a day (BID) | ORAL | Status: DC
  Administered 2021-09-06 (×2): 12.5 mg via ORAL
  Filled 2021-09-05 (×2): qty 1

## 2021-09-05 MED ORDER — LORAZEPAM 1 MG PO TABS: 1.0000 mg | ORAL_TABLET | ORAL | Status: DC | PRN

## 2021-09-05 MED ORDER — ATORVASTATIN CALCIUM 40 MG PO TABS
40.0000 mg | ORAL_TABLET | Freq: Every day | ORAL | Status: DC
  Administered 2021-09-06: 40 mg via ORAL
  Filled 2021-09-05: qty 1

## 2021-09-05 MED ORDER — THIAMINE HCL 100 MG PO TABS
100.0000 mg | ORAL_TABLET | Freq: Every day | ORAL | Status: DC
  Administered 2021-09-05 – 2021-09-06 (×2): 100 mg via ORAL
  Filled 2021-09-05 (×2): qty 1

## 2021-09-05 MED ORDER — PANTOPRAZOLE SODIUM 40 MG PO TBEC
40.0000 mg | DELAYED_RELEASE_TABLET | Freq: Every day | ORAL | Status: DC
  Administered 2021-09-06: 40 mg via ORAL
  Filled 2021-09-05: qty 1

## 2021-09-05 MED ORDER — FOLIC ACID 1 MG PO TABS
1.0000 mg | ORAL_TABLET | Freq: Every day | ORAL | Status: DC
  Administered 2021-09-05 – 2021-09-06 (×2): 1 mg via ORAL
  Filled 2021-09-05 (×2): qty 1

## 2021-09-05 MED ORDER — LORAZEPAM 2 MG/ML IJ SOLN: 1.0000 mg | INTRAMUSCULAR | Status: DC | PRN

## 2021-09-05 MED ORDER — ADULT MULTIVITAMIN W/MINERALS CH
1.0000 | ORAL_TABLET | Freq: Every day | ORAL | Status: DC
  Administered 2021-09-05: 1 via ORAL
  Filled 2021-09-05 (×2): qty 1

## 2021-09-05 NOTE — ED Provider Notes (Signed)
[facility] EMERGENCY DEPARTMENT Provider Note   CSN: [id number] Arrival date & time: 09/05/21  1126     History Chief Complaint  Patient presents with   Loss of Consciousness   Chest Pain    [name] is a 55 y.o. male.  Patient with history of hypertension, high cholesterol, 40-pack-year smoking history --presents to the emergency department for evaluation of syncope and head injury.  Patient reports sitting in a chair and had a syncopal spell last night.  He fell forward and struck his forehead on the floor.  He does not remember this.  He does not know how long he was out for.  He sustained an abrasion to the right forehead and had a nosebleed afterwards.  In addition to this, patient has been having chest pains over the past several weeks.  He has seen his primary care physician for this who prescribed pantoprazole.  Pain is atypical and thought to likely be GI related, however he was referred to cardiology for a stress test due to risk factors.  He has not yet received an appointment.  Last night was the patient's first syncopal spell, however he has had episodic lightheadedness, approximately twice a week over the past several weeks.  This resolves after about a minute or so.  Chest pain is not directly related to the lightheaded spells and he did not have significant change of his chest pain last night. The onset of this condition was acute. The course is constant. Aggravating factors: none. Alleviating factors: none.        Past Medical History:  Diagnosis Date   Allergy    Anemia    Colonic diverticular abscess 12/26/2014   Diverticulitis    ETOH abuse    GAD (generalized anxiety disorder)    GERD (gastroesophageal reflux disease)    Hypercholesteremia    Hypertension    Left rotator cuff tear 02/08/2019   Superior labrum anterior-to-posterior (SLAP) tear of left shoulder 02/08/2019    Patient Active Problem List   Diagnosis Date Noted   Left  rotator cuff tear 02/08/2019   Superior labrum anterior-to-posterior (SLAP) tear of left shoulder 02/08/2019   Heme + stool 09/17/2017   Hemorrhoids 09/17/2017   Peptic ulcer, acute with perforation ([place]) 04/14/2016   Disorder of appendix 12/31/2014   Diverticulitis of colon with perforation 12/30/2014   Colonic diverticular abscess 12/26/2014   ETOH abuse 12/26/2014   Acute colitis 12/08/2014   Hyperglycemia 12/08/2014   Obesity 12/08/2014   Colitis 12/08/2014   GAD (generalized anxiety disorder)    Atypical chest pain 02/13/2013   Tobacco abuse 02/13/2013   Other and unspecified hyperlipidemia 02/13/2013   Allergy    Hypertension    Anxiety state 05/25/2009   Essential hypertension 05/25/2009   DEGENERATIVE JOINT DISEASE 05/25/2009    Past Surgical History:  Procedure Laterality Date   APPENDECTOMY N/A 01/01/2015   Procedure: INCIDENTAL APPENDECTOMY;  Surgeon: Jamesetta So, MD;  Location: [facility];  Service: General;  Laterality: N/A;   BACK SURGERY     lower back, fusion   COLONOSCOPY WITH PROPOFOL N/A 12/21/2017   Procedure: COLONOSCOPY WITH PROPOFOL;  Surgeon: Daneil Dolin, MD;  [facility];  Service: Endoscopy;  Laterality: N/A;  9:45am   GASTRECTOMY N/A 04/14/2016   Procedure: GASTRORRHAPHY;  Surgeon: Aviva Signs, MD;  Location: [facility];  Service: General;  Laterality: N/A;   [place]     as  child   PARTIAL COLECTOMY N/A 01/01/2015   Procedure: PARTIAL COLECTOMY;  Surgeon: Jamesetta So, MD;  Location: [facility];  Service: General;  Laterality: N/A;   POLYPECTOMY  12/21/2017   Procedure: POLYPECTOMY;  Surgeon: Daneil Dolin, MD;  [facility];  Service: Endoscopy;;  colon   ROTATOR CUFF REPAIR Bilateral    SHOULDER ARTHROSCOPY WITH ROTATOR CUFF REPAIR AND SUBACROMIAL DECOMPRESSION Left 02/09/2019   Procedure: SHOULDER ARTHROSCOPY WITH ROTATOR CUFF REPAIR AND SUBACROMIAL PARTIAL ACROMIOPLASTY, DISTAL CLAVICULECTOMY, AND  EXTENSIVE DEBRIDEMENT;  Surgeon: Elsie Saas, MD;  Location: [facility];  Service: Orthopedics;  Laterality: Left;       Family History  Problem Relation Age of Onset   Stroke Mother    CAD Father    Colon cancer Neg Hx     Social History   Tobacco Use   Smoking status: Every Day    Packs/day: 1.00    Years: 35.00    Pack years: 35.00    Types: Cigarettes   Smokeless tobacco: Never  Vaping Use   Vaping Use: Never used  Substance Use Topics   Alcohol use: Yes    Alcohol/week: 0.0 standard drinks    Comment: patient states "about 5 or 6 a day" (beers)   Drug use: No    Home Medications Prior to Admission medications   Medication Sig Start Date End Date Taking? Authorizing Provider  acetaminophen (TYLENOL) 500 MG tablet Take 500 mg by mouth every 6 (six) hours as needed for moderate pain or headache.    [provider]  amLODipine (NORVASC) 10 MG tablet Take 1 tablet (10 mg total) by mouth daily. 03/21/21   Susy Frizzle, MD  atorvastatin (LIPITOR) 40 MG tablet TAKE 1 TABLET BY MOUTH EVERY DAY 03/25/18   Susy Frizzle, MD  carvedilol (COREG) 12.5 MG tablet Take 1 tablet (12.5 mg total) by mouth 2 (two) times daily with a meal. 03/21/21   Susy Frizzle, MD  clonazePAM (KLONOPIN) 0.5 MG tablet TAKE 1 TABLET BY MOUTH TWICE A DAY AS NEEDED FOR ANXIETY 07/15/21   Susy Frizzle, MD  cyclobenzaprine (FLEXERIL) 10 MG tablet TAKE 1 TABLET BY MOUTH EVERYDAY AT BEDTIME 10/02/20   Susy Frizzle, MD  doxycycline (VIBRA-TABS) 100 MG tablet Take 1 tablet (100 mg total) by mouth 2 (two) times daily. 08/29/21   Susy Frizzle, MD  fluticasone (FLONASE) 50 MCG/ACT nasal spray SPRAY 2 SPRAYS INTO EACH NOSTRIL EVERY DAY 01/31/21   Susy Frizzle, MD  hydrochlorothiazide (MICROZIDE) 12.5 MG capsule TAKE 1 CAPSULE BY MOUTH EVERY DAY 11/07/20   Susy Frizzle, MD  losartan (COZAAR) 50 MG tablet Take 1 tablet (50 mg total) by mouth daily. 03/21/21    Susy Frizzle, MD  pantoprazole (PROTONIX) 40 MG tablet Take 1 tablet (40 mg total) by mouth daily. 08/29/21   Susy Frizzle, MD  sildenafil (VIAGRA) 100 MG tablet Take 1 tablet (100 mg total) by mouth daily as needed for erectile dysfunction. 04/01/21   Susy Frizzle, MD  venlafaxine XR (EFFEXOR-XR) 150 MG 24 hr capsule TAKE 1 CAPSULE BY MOUTH EVERY DAY IN THE MORNING 03/11/21   Susy Frizzle, MD    Allergies    Patient has no known allergies.  Review of Systems   Review of Systems  Constitutional:  Negative for diaphoresis and fever.  Eyes:  Negative for redness.  Respiratory:  Negative for cough and shortness of breath.  Cardiovascular:  Positive for chest pain. Negative for palpitations and leg swelling.  Gastrointestinal:  Negative for abdominal pain, nausea and vomiting.  Genitourinary:  Negative for dysuria.  Musculoskeletal:  Negative for back pain and neck pain.  Skin:  Negative for rash.  Neurological:  Positive for syncope and light-headedness.  Psychiatric/Behavioral:  The patient is not nervous/anxious.    Physical Exam Updated Vital Signs BP 136/88   Pulse 80   Temp 98.6 F (37 C) (Oral)   Resp 18   SpO2 98%   Physical Exam Vitals and nursing note reviewed.  Constitutional:      Appearance: He is well-developed. He is not diaphoretic.  HENT:     Head: Normocephalic and atraumatic.     Mouth/Throat:     Mouth: Mucous membranes are not dry.  Eyes:     Conjunctiva/sclera: Conjunctivae normal.  Neck:     Vascular: Normal carotid pulses. No carotid bruit or JVD.     Trachea: Trachea normal. No tracheal deviation.  Cardiovascular:     Rate and Rhythm: Normal rate and regular rhythm. Extrasystoles are present.    Pulses: No decreased pulses.          Radial pulses are 2+ on the right side and 2+ on the left side.     Heart sounds: Normal heart sounds, S1 normal and S2 normal. Heart sounds not distant. No murmur heard. Pulmonary:     Effort:  Pulmonary effort is normal. No respiratory distress.     Breath sounds: Normal breath sounds. No wheezing.  Chest:     Chest wall: No tenderness.  Abdominal:     General: Bowel sounds are normal.     Palpations: Abdomen is soft.     Tenderness: There is no abdominal tenderness. There is no guarding or rebound.  Musculoskeletal:     Cervical back: Normal range of motion and neck supple. No muscular tenderness.     Right lower leg: No edema.     Left lower leg: No edema.  Skin:    General: Skin is warm and dry.     Coloration: Skin is not pale.  Neurological:     Mental Status: He is alert. Mental status is at baseline.  Psychiatric:        Mood and Affect: Mood normal.    ED Results / Procedures / Treatments   Labs (all labs ordered are listed, but only abnormal results are displayed) Labs Reviewed  BASIC METABOLIC PANEL - Abnormal; Notable for the following components:      Result Value   Glucose, Bld 102 (*)    All other components within normal limits  RESP PANEL BY RT-PCR (FLU A&B, COVID) ARPGX2  CBC  URINALYSIS, ROUTINE W REFLEX MICROSCOPIC  HIV ANTIBODY (ROUTINE TESTING W REFLEX)  CBG MONITORING, ED  TROPONIN I (HIGH SENSITIVITY)    EKG EKG Interpretation  Date/Time:  Thursday September 05 2021 11:58:08 EDT Ventricular Rate:  82 PR Interval:  164 QRS Duration: 64 QT Interval:  352 QTC Calculation: 411 R Axis:   84 Text Interpretation: Normal sinus rhythm Anterior infarct , age undetermined Abnormal ECG Confirmed by Lennice Sites [phone]) on 09/05/2021 6:06:46 PM  Radiology CT Head Wo Contrast  Result Date: 09/05/2021 CLINICAL DATA:  Head trauma, mod-severe head injury EXAM: CT HEAD WITHOUT CONTRAST TECHNIQUE: Contiguous axial images were obtained from the base of the skull through the vertex without intravenous contrast. COMPARISON:  None. FINDINGS: Brain: There is no acute intracranial hemorrhage, mass  effect, or edema. Gray-white differentiation is preserved.  There is no extra-axial fluid collection. Ventricles and sulci are within normal limits in size and configuration. Vascular: There is atherosclerotic calcification at the skull base. Skull: Calvarium is unremarkable. Sinuses/Orbits: No acute finding. Other: None. IMPRESSION: No evidence of acute intracranial injury. Electronically Signed   By: Macy Mis M.D.   On: 09/05/2021 13:56    Procedures Procedures   Medications Ordered in ED Medications - No data to display  ED Course  I have reviewed the triage vital signs and the nursing notes.  Pertinent labs & imaging results that were available during my care of the patient were reviewed by me and considered in my medical decision making (see chart for details).  Patient seen and examined. Work-up initiated.  Triage orders reviewed.  Troponin negative x1.  Head CT without any acute traumatic injury but there was note of atherosclerotic calcification at the skull base.  Added chest x-ray.  At this time low concern for hemorrhagic stroke, PE.  EKG did have some nonspecific T wave changes but not appreciably different from March 2020.  Vital signs reviewed and are as follows: BP 136/88   Pulse 80   Temp 98.6 F (37 C) (Oral)   Resp 18   SpO2 98%   Patient discussed with Dr. Ronnald Nian who has seen.   8:32 PM CXR reviewed.   8:47 PM Pt updated on results. COVID neg.   9:21 PM Spoke with Dr. Alcario Drought who will see.     MDM Rules/Calculators/A&P                           Admit for chest pain observation inpatient with risk factors, heart score of 4, syncopal spell yesterday without prodrome concerning for possible arrhythmia.  Final Clinical Impression(s) / ED Diagnoses Final diagnoses:  Syncope and collapse  Precordial pain  Minor head injury, initial encounter    Rx / DC Orders ED Discharge Orders     None        Carlisle Cater, PA-C 09/05/21 2123    Lennice Sites, DO 09/05/21 2252

## 2021-09-05 NOTE — ED Provider Notes (Signed)
I personally evaluated the patient during the encounter and completed a history, physical, procedures, medical decision making to contribute to the overall care of the patient and decision making for the patient briefly, the patient is a 55 y.o. male is a 55 year old male with history of hypertension, high cholesterol, alcohol abuse who presents the ED after episode of loss of consciousness today.  This is unprovoked.  Has been having chest pain the last several days on and off.  He supposed to have cardiology follow-up.  He is not having any active chest pain now.  Neurologically he is intact.  He states that yesterday he was sitting down and all of a sudden next thing he knew was on the floor.  Head CT is normal.  EKG shows sinus rhythm.  Some T wave changes laterally that appear to be old.  Troponins negative.  Chest x-ray negative for infection.  Urinalysis negative for infection.  Overall given his multiple risk factors, unprovoked syncope will admit for further work-up.  No concern for dissection or PE at this time.  This chart was dictated using voice recognition software.  Despite best efforts to proofread,  errors can occur which can change the documentation meaning.     EKG Interpretation  Date/Time:  Thursday September 05 2021 19:14:14 EDT Ventricular Rate:  70 PR Interval:  156 QRS Duration: 76 QT Interval:  416 QTC Calculation: 449 R Axis:   82 Text Interpretation: Normal sinus rhythm Cannot rule out Anterior infarct , age undetermined Abnormal ECG Confirmed by Lennice Sites [phone]) on 09/05/2021 7:23:32 PM            Lennice Sites, DO 09/05/21 2103

## 2021-09-05 NOTE — ED Provider Notes (Signed)
Emergency Medicine Provider Triage Evaluation Note  [name] , a 55 y.o. male  was evaluated in triage.  Pt complains of a head injury after losing consciousness that occurred last night.  Patient states that he was sitting down when he out of the blue lost consciousness fell forward and smacked his forehead against the concrete on his back porch.  He has been having intermittent chest pains localized to the left chest and was having chest pain earlier in the day.  It is worse with exertion and better with rest.  Denies any weakness/numbness to the upper and lower extremities.  Did have an episode of epistaxis after the fall.  Unknown downtime.  Review of Systems  Positive:  Negative: See above  Physical Exam  BP (!) 133/91 (BP Location: Right Arm)   Pulse 78   Temp 98.6 F (37 C) (Oral)   Resp 19   SpO2 97%  Gen:   Awake, no distress   Resp:  Normal effort  MSK:   Moves extremities without difficulty  Other:  Abrasion to the forehead.  Nose is nontender and no obvious epistaxis at this time.  Heart is regular rate and rhythm.  Chest is clear to auscultation bilaterally.  Medical Decision Making  Medically screening exam initiated at 12:34 PM.  Appropriate orders placed.  [name] was informed that the remainder of the evaluation will be completed by another provider, this initial triage assessment does not replace that evaluation, and the importance of remaining in the ED until their evaluation is complete.     Myna Bright [place], PA-C 09/05/21 1236    Tegeler, Gwenyth Allegra, MD 09/05/21 1501

## 2021-09-05 NOTE — ED Triage Notes (Signed)
Pt reports constant chest pain and shob x months, for which he has seen PCP who is working on getting him set up with a cardiologist. Also reports last night he felt dizzy and passed out while standing, causing him to fall. Abrasion to forehead.

## 2021-09-05 NOTE — H&P (Signed)
History and Physical    [name] OVZ:[id number] DOB: 31-Aug-1966 DOA: 09/05/2021  PCP: Susy Frizzle, MD  Patient coming from: Home  I have personally briefly reviewed patient's old medical records in [place]  Chief Complaint: CP, syncope  HPI: [name] is a 55 y.o. male with medical history significant of HTN, HLD, smoking.  Pt presents to ED with c/o intermittent CP, syncope episode last evening.  Pt sitting in chair, had sudden syncope last night.  Pt has been having intermittent CP over past few weeks.  CP worse with activity, better at rest.  Saw PCP who prescribed protonix.  Referred to cards for stress test but hasnt yet gotten appointment.  No fevers, chills, abd pain.   ED Course: Trop neg.  EKG with non-specific T wave changes, similar to priors.   Review of Systems: As per HPI, otherwise all review of systems negative.  Past Medical History:  Diagnosis Date   Allergy    Anemia    Colonic diverticular abscess 12/26/2014   Diverticulitis    ETOH abuse    GAD (generalized anxiety disorder)    GERD (gastroesophageal reflux disease)    Hypercholesteremia    Hypertension    Left rotator cuff tear 02/08/2019   Superior labrum anterior-to-posterior (SLAP) tear of left shoulder 02/08/2019    Past Surgical History:  Procedure Laterality Date   APPENDECTOMY N/A 01/01/2015   Procedure: INCIDENTAL APPENDECTOMY;  Surgeon: Jamesetta So, MD;  Location: [facility];  Service: General;  Laterality: N/A;   BACK SURGERY     lower back, fusion   COLONOSCOPY WITH PROPOFOL N/A 12/21/2017   Procedure: COLONOSCOPY WITH PROPOFOL;  Surgeon: Daneil Dolin, MD;  Location: [facility];  Service: Endoscopy;  Laterality: N/A;  9:45am   GASTRECTOMY N/A 04/14/2016   Procedure: GASTRORRHAPHY;  Surgeon: Aviva Signs, MD;  Location: [facility];  Service: General;  Laterality: N/A;   [place]     as child   PARTIAL COLECTOMY N/A 01/01/2015    Procedure: PARTIAL COLECTOMY;  Surgeon: Jamesetta So, MD;  Location: [facility];  Service: General;  Laterality: N/A;   POLYPECTOMY  12/21/2017   Procedure: POLYPECTOMY;  Surgeon: Daneil Dolin, MD;  [facility];  Service: Endoscopy;;  colon   ROTATOR CUFF REPAIR Bilateral    SHOULDER ARTHROSCOPY WITH ROTATOR CUFF REPAIR AND SUBACROMIAL DECOMPRESSION Left 02/09/2019   Procedure: SHOULDER ARTHROSCOPY WITH ROTATOR CUFF REPAIR AND SUBACROMIAL PARTIAL ACROMIOPLASTY, DISTAL CLAVICULECTOMY, AND EXTENSIVE DEBRIDEMENT;  Surgeon: Elsie Saas, MD;  Location: [place];  Service: Orthopedics;  Laterality: Left;     reports that he has been smoking cigarettes. He has a 35.00 pack-year smoking history. He has never used smokeless tobacco. He reports current alcohol use. He reports that he does not use drugs.  No Known Allergies  Family History  Problem Relation Age of Onset   Stroke Mother    CAD Father    Colon cancer Neg Hx      Prior to Admission medications   Medication Sig Start Date End Date Taking? Authorizing Provider  acetaminophen (TYLENOL) 500 MG tablet Take 500 mg by mouth every 6 (six) hours as needed for moderate pain or headache.   Yes [provider]  amLODipine (NORVASC) 10 MG tablet Take 1 tablet (10 mg total) by mouth daily. 03/21/21  Yes Susy Frizzle, MD  Aspirin-Caffeine [phone] MG PACK Take 1 packet by  mouth as needed (headache).   Yes [provider]  aspirin-sod bicarb-citric acid (ALKA-SELTZER) 325 MG TBEF tablet Take 325 mg by mouth every 6 (six) hours as needed (upset stomach).   Yes [provider]  atorvastatin (LIPITOR) 40 MG tablet TAKE 1 TABLET BY MOUTH EVERY DAY Patient taking differently: Take 40 mg by mouth daily. 03/25/18  Yes Susy Frizzle, MD  carvedilol (COREG) 12.5 MG tablet Take 1 tablet (12.5 mg total) by mouth 2 (two) times daily with a meal. 03/21/21  Yes Pickard, Cammie Mcgee, MD  clonazePAM  (KLONOPIN) 0.5 MG tablet TAKE 1 TABLET BY MOUTH TWICE A DAY AS NEEDED FOR ANXIETY Patient taking differently: Take 0.5 mg by mouth 2 (two) times daily as needed for anxiety. 07/15/21  Yes Susy Frizzle, MD  cyclobenzaprine (FLEXERIL) 10 MG tablet TAKE 1 TABLET BY MOUTH EVERYDAY AT BEDTIME Patient taking differently: Take 10 mg by mouth at bedtime. 10/02/20  Yes Susy Frizzle, MD  doxycycline (VIBRA-TABS) 100 MG tablet Take 1 tablet (100 mg total) by mouth 2 (two) times daily. 08/29/21  Yes Susy Frizzle, MD  fluticasone (FLONASE) 50 MCG/ACT nasal spray SPRAY 2 SPRAYS INTO EACH NOSTRIL EVERY DAY Patient taking differently: Place 2 sprays into both nostrils daily. 01/31/21  Yes Susy Frizzle, MD  losartan (COZAAR) 50 MG tablet Take 1 tablet (50 mg total) by mouth daily. 03/21/21  Yes Susy Frizzle, MD  Menthol (BIOFREEZE) 10.5 % AERO Apply 1-2 sprays topically as needed (pain).   Yes [provider]  pantoprazole (PROTONIX) 40 MG tablet Take 1 tablet (40 mg total) by mouth daily. 08/29/21  Yes Susy Frizzle, MD  sildenafil (VIAGRA) 100 MG tablet Take 1 tablet (100 mg total) by mouth daily as needed for erectile dysfunction. 04/01/21  Yes Susy Frizzle, MD  venlafaxine XR (EFFEXOR-XR) 150 MG 24 hr capsule TAKE 1 CAPSULE BY MOUTH EVERY DAY IN THE MORNING Patient taking differently: Take 150 mg by mouth in the morning. 03/11/21  Yes Susy Frizzle, MD    Physical Exam: Vitals:   09/05/21 1154 09/05/21 1447 09/05/21 1744  BP: (!) 133/91 (!) 139/93 136/88  Pulse: 78 73 80  Resp: 19 17 18   Temp: 98.6 F (37 C)    TempSrc: Oral    SpO2: 97% 97% 98%    Constitutional: NAD, calm, comfortable Eyes: PERRL, lids and conjunctivae normal ENMT: Mucous membranes are moist. Posterior pharynx clear of any exudate or lesions.Normal dentition.  Neck: normal, supple, no masses, no thyromegaly Respiratory: clear to auscultation bilaterally, no wheezing, no crackles. Normal  respiratory effort. No accessory muscle use.  Cardiovascular: Regular rate and rhythm, no murmurs / rubs / gallops. No extremity edema. 2+ pedal pulses. No carotid bruits.  Abdomen: no tenderness, no masses palpated. No hepatosplenomegaly. Bowel sounds positive.  Musculoskeletal: no clubbing / cyanosis. No joint deformity upper and lower extremities. Good ROM, no contractures. Normal muscle tone.  Skin: no rashes, lesions, ulcers. No induration Neurologic: CN 2-12 grossly intact. Sensation intact, DTR normal. Strength 5/5 in all 4.  Psychiatric: Normal judgment and insight. Alert and oriented x 3. Normal mood.    Labs on Admission: I have personally reviewed following labs and imaging studies  CBC: Recent Labs  Lab 09/05/21 1240  WBC 9.1  HGB 15.2  HCT 47.2  MCV 90.4  PLT 470   Basic Metabolic Panel: Recent Labs  Lab 09/05/21 1240  NA 135  K 4.9  CL 99  CO2 27  GLUCOSE 102*  BUN 11  CREATININE 0.78  CALCIUM 9.5   GFR: Estimated Creatinine Clearance: 116 mL/min (by C-G formula based on SCr of 0.78 mg/dL). Liver Function Tests: No results for input(s): AST, ALT, ALKPHOS, BILITOT, PROT, ALBUMIN in the last 168 hours. No results for input(s): LIPASE, AMYLASE in the last 168 hours. No results for input(s): AMMONIA in the last 168 hours. Coagulation Profile: No results for input(s): INR, PROTIME in the last 168 hours. Cardiac Enzymes: No results for input(s): CKTOTAL, CKMB, CKMBINDEX, TROPONINI in the last 168 hours. BNP (last 3 results) No results for input(s): PROBNP in the last 8760 hours. HbA1C: No results for input(s): HGBA1C in the last 72 hours. CBG: No results for input(s): GLUCAP in the last 168 hours. Lipid Profile: No results for input(s): CHOL, HDL, LDLCALC, TRIG, CHOLHDL, LDLDIRECT in the last 72 hours. Thyroid Function Tests: No results for input(s): TSH, T4TOTAL, FREET4, T3FREE, THYROIDAB in the last 72 hours. Anemia Panel: No results for input(s):  VITAMINB12, FOLATE, FERRITIN, TIBC, IRON, RETICCTPCT in the last 72 hours. Urine analysis:    Component Value Date/Time   COLORURINE YELLOW 09/05/2021 [place] 09/05/2021 1235   LABSPEC 1.013 09/05/2021 1235   PHURINE 5.0 09/05/2021 1235   GLUCOSEU NEGATIVE 09/05/2021 1235   HGBUR NEGATIVE 09/05/2021 [place] 09/05/2021 [place] 09/05/2021 1235   PROTEINUR NEGATIVE 09/05/2021 1235   UROBILINOGEN 0.2 12/30/2014 1856   NITRITE NEGATIVE 09/05/2021 [place] 09/05/2021 1235    Radiological Exams on Admission: DG Chest 2 View  Result Date: 09/05/2021 CLINICAL DATA:  Chest pain and shortness of breath. EXAM: CHEST - 2 VIEW COMPARISON:  September 16, 2016 FINDINGS: The heart size and mediastinal contours are within normal limits. Both lungs are clear. A radiopaque fusion plate and screws are seen overlying the cervical spine. A chronic ninth left rib fracture is noted. IMPRESSION: No active cardiopulmonary disease. Electronically Signed   By: Virgina Norfolk M.D.   On: 09/05/2021 20:31   CT Head Wo Contrast  Result Date: 09/05/2021 CLINICAL DATA:  Head trauma, mod-severe head injury EXAM: CT HEAD WITHOUT CONTRAST TECHNIQUE: Contiguous axial images were obtained from the base of the skull through the vertex without intravenous contrast. COMPARISON:  None. FINDINGS: Brain: There is no acute intracranial hemorrhage, mass effect, or edema. Gray-white differentiation is preserved. There is no extra-axial fluid collection. Ventricles and sulci are within normal limits in size and configuration. Vascular: There is atherosclerotic calcification at the skull base. Skull: Calvarium is unremarkable. Sinuses/Orbits: No acute finding. Other: None. IMPRESSION: No evidence of acute intracranial injury. Electronically Signed   By: Macy Mis M.D.   On: 09/05/2021 13:56    EKG: Independently reviewed.  Assessment/Plan Principal  Problem:   Chest pain, rule out acute myocardial infarction Active Problems:   Essential hypertension   ETOH abuse   Syncope    CP r/o - HEART score = 5 CP obs pathway Tele monitor 2d echo NPO after [facility] Cards eval in AM Syncope - Tele monitor 2d echo HTN - Cont home BP meds H/o EtOH abuse - CIWA H/o perforated gastric ulcer, ? Of GERD - Cont PPI  DVT prophylaxis: Lovenox Code Status: Full Family Communication: Wife at bedside Disposition Plan: Home after cards clearance Consults called: Message sent to P. Trent for cards eval in AM Admission status: Place in [place], [place] Hospitalists  How to contact the [facility] Attending or Consulting provider [place] or covering provider during after hours [place], for this patient?  Check the care team in [facility] and look for a) attending/consulting [facility] provider listed and b) the [facility] team listed Log into [email or web address]  Amion Physician Scheduling and messaging for groups and whole hospitals  On call and physician scheduling software for group practices, residents, hospitalists and other medical providers for call, clinic, rotation and shift schedules. OnCall Enterprise is a hospital-wide system for scheduling doctors and paging doctors on call. EasyPlot is for scientific plotting and data analysis.  [email or web address]  and use 's universal password to access. If you do not have the password, please contact the hospital operator.  Locate the [facility] provider you are looking for under [facility] and page to a number that you can be directly reached. If you still have difficulty reaching the provider, please page the [facility] (Director on Call) for the Hospitalists listed on amion for assistance.  09/05/2021, 9:48 PM

## 2021-09-06 ENCOUNTER — Encounter (HOSPITAL_COMMUNITY)
Admission: EM | Disposition: A | Discharge status: Home / Self Care | Payer: Self-pay | Source: Home / Self Care | Attending: Emergency Medicine

## 2021-09-06 ENCOUNTER — Encounter (HOSPITAL_COMMUNITY): Payer: Self-pay | Admitting: Internal Medicine

## 2021-09-06 ENCOUNTER — Observation Stay (HOSPITAL_COMMUNITY): Payer: BC Managed Care – PPO

## 2021-09-06 ENCOUNTER — Observation Stay (HOSPITAL_BASED_OUTPATIENT_CLINIC_OR_DEPARTMENT_OTHER): Payer: BC Managed Care – PPO

## 2021-09-06 DIAGNOSIS — F1010 Alcohol abuse, uncomplicated: Secondary | ICD-10-CM | POA: Diagnosis not present

## 2021-09-06 DIAGNOSIS — R9431 Abnormal electrocardiogram [ECG] [EKG]: Secondary | ICD-10-CM

## 2021-09-06 DIAGNOSIS — R079 Chest pain, unspecified: Secondary | ICD-10-CM

## 2021-09-06 DIAGNOSIS — F411 Generalized anxiety disorder: Secondary | ICD-10-CM | POA: Diagnosis not present

## 2021-09-06 DIAGNOSIS — Z20822 Contact with and (suspected) exposure to covid-19: Secondary | ICD-10-CM | POA: Diagnosis not present

## 2021-09-06 DIAGNOSIS — F32A Depression, unspecified: Secondary | ICD-10-CM | POA: Diagnosis not present

## 2021-09-06 DIAGNOSIS — F17210 Nicotine dependence, cigarettes, uncomplicated: Secondary | ICD-10-CM | POA: Diagnosis not present

## 2021-09-06 DIAGNOSIS — F17200 Nicotine dependence, unspecified, uncomplicated: Secondary | ICD-10-CM

## 2021-09-06 DIAGNOSIS — I10 Essential (primary) hypertension: Secondary | ICD-10-CM | POA: Diagnosis not present

## 2021-09-06 DIAGNOSIS — Z79899 Other long term (current) drug therapy: Secondary | ICD-10-CM | POA: Diagnosis not present

## 2021-09-06 DIAGNOSIS — R55 Syncope and collapse: Secondary | ICD-10-CM | POA: Diagnosis not present

## 2021-09-06 DIAGNOSIS — E785 Hyperlipidemia, unspecified: Secondary | ICD-10-CM | POA: Diagnosis not present

## 2021-09-06 DIAGNOSIS — I2510 Atherosclerotic heart disease of native coronary artery without angina pectoris: Secondary | ICD-10-CM | POA: Diagnosis not present

## 2021-09-06 DIAGNOSIS — R269 Unspecified abnormalities of gait and mobility: Secondary | ICD-10-CM | POA: Diagnosis not present

## 2021-09-06 DIAGNOSIS — K219 Gastro-esophageal reflux disease without esophagitis: Secondary | ICD-10-CM | POA: Diagnosis not present

## 2021-09-06 DIAGNOSIS — Z7982 Long term (current) use of aspirin: Secondary | ICD-10-CM | POA: Diagnosis not present

## 2021-09-06 HISTORY — PX: LOOP RECORDER INSERTION: EP1214

## 2021-09-06 LAB — HIV ANTIBODY (ROUTINE TESTING W REFLEX): HIV Screen 4th Generation wRfx: NONREACTIVE

## 2021-09-06 LAB — ECHOCARDIOGRAM COMPLETE
Area-P 1/2: 2.76 cm2
Height: 68 in
S' Lateral: 3.1 cm
Weight: 3417.6 oz

## 2021-09-06 SURGERY — LOOP RECORDER INSERTION

## 2021-09-06 MED ORDER — THIAMINE HCL 100 MG PO TABS: 100.0000 mg | ORAL_TABLET | Freq: Every day | ORAL | 0 refills | Status: AC

## 2021-09-06 MED ORDER — ASPIRIN EC 81 MG PO TBEC: 81.0000 mg | DELAYED_RELEASE_TABLET | Freq: Every day | ORAL | 0 refills | Status: DC

## 2021-09-06 MED ORDER — NITROGLYCERIN 0.4 MG SL SUBL
SUBLINGUAL_TABLET | SUBLINGUAL | Status: AC
  Filled 2021-09-06: qty 2

## 2021-09-06 MED ORDER — ADULT MULTIVITAMIN W/MINERALS CH: 1.0000 | ORAL_TABLET | Freq: Every day | ORAL | 0 refills | Status: AC

## 2021-09-06 MED ORDER — METOPROLOL TARTRATE 5 MG/5ML IV SOLN
INTRAVENOUS | Status: AC
  Filled 2021-09-06: qty 5

## 2021-09-06 MED ORDER — IOHEXOL 350 MG/ML SOLN
100.0000 mL | Freq: Once | INTRAVENOUS | Status: AC | PRN
  Administered 2021-09-06: 100 mL via INTRAVENOUS

## 2021-09-06 MED ORDER — LIDOCAINE-EPINEPHRINE 1 %-1:100000 IJ SOLN
INTRAMUSCULAR | Status: DC | PRN
  Administered 2021-09-06: 20 mL

## 2021-09-06 MED ORDER — LIDOCAINE-EPINEPHRINE 1 %-1:100000 IJ SOLN
INTRAMUSCULAR | Status: AC
  Filled 2021-09-06: qty 1

## 2021-09-06 MED ORDER — FOLIC ACID 1 MG PO TABS: 1.0000 mg | ORAL_TABLET | Freq: Every day | ORAL | 0 refills | Status: AC

## 2021-09-06 MED ORDER — METOPROLOL TARTRATE 5 MG/5ML IV SOLN
INTRAVENOUS | Status: AC
  Administered 2021-09-06: 10 mg
  Filled 2021-09-06: qty 5

## 2021-09-06 SURGICAL SUPPLY — 2 items
MONITOR MOBILE MNGR LINQ22 (Prosthesis & Implant Heart) ×1 IMPLANT
PACK LOOP INSERTION (CUSTOM PROCEDURE TRAY) ×2 IMPLANT

## 2021-09-06 NOTE — Interval H&P Note (Signed)
History and Physical Interval Note:  09/06/2021 3:38 PM  [name]  has presented today for surgery, with the diagnosis of syncope.  The various methods of treatment have been discussed with the patient and family. After consideration of risks, benefits and other options for treatment, the patient has consented to  Procedure(s): LOOP RECORDER INSERTION (N/A) as a surgical intervention.  The patient's history has been reviewed, patient examined, no change in status, stable for surgery.  I have reviewed the patient's chart and labs.  Questions were answered to the patient's satisfaction.     Bartosz Luginbill

## 2021-09-06 NOTE — Progress Notes (Signed)
PROGRESS NOTE  [name] TIW:[id number] DOB: [birth year] DOA: 09/05/2021 PCP: Susy Frizzle, MD  HPI/Recap of past 24 hours: [name] is a 55 y.o. male with medical history significant of HTN, HLD, smoking.  Pt presents to ED with c/o intermittent CP, syncope episode last evening.  Pt sitting in chair, had sudden syncope last night.  Pt has been having intermittent CP over past few weeks.  CP worse with activity, better at rest.  Saw PCP who prescribed protonix.  Referred to cards for stress test but hasnt yet gotten appointment.  Seen by cardiology.  Concern for possible arrhythmia with syncope and no prodromal symptoms.  09/06/21:  Seen and examined at bedside.  He denies chest pain.  No dyspnea.  No evidence of acute alcohol withdrawal at time of visit.  Assessment/Plan: Principal Problem:   Chest pain, rule out acute myocardial infarction Active Problems:   Essential hypertension   ETOH abuse   Syncope  Chest pain rule out ACS Heart score 5 Follow 2D echo Seen by cardiology, awaiting recommendations Continue to monitor on cardiac telemetry.  Syncope, rule out arrhythmia No prodromes Follow 2D echo Continue to monitor on telemetry Obtain orthostatic vital signs.  Essential hypertension BP is currently at goal Continue home Norvasc 10 mg daily, Coreg 12.5 mg twice daily, losartan 50 mg daily.  Chronic anxiety/depression Resume home Effexor  GERD Resume home PPI  Hyperlipidemia Resume home Lipitor.  Alcohol abuse No evidence of acute alcohol withdrawal at the time of this visit Continue CIWA protocol Continue multivitamins, thiamine and folic acid supplementation Alcohol cessation counseling.   Code Status: Full code  Family Communication: Wife at bedside   Disposition Plan:  likely will discharge to home once cardiology signs of.   Consultants: Cardiology  Procedures: 2D echo  Antimicrobials: 9  DVT  prophylaxis: SCDs  Status is: Observation        Objective: Vitals:   09/05/21 2354 09/06/21 0547 09/06/21 0839 09/06/21 1149  BP: (!) 157/92 (!) 146/84 (!) 137/95 121/70  Pulse: 78 81 75 73  Resp: 18 18 18    Temp: 98.4 F (36.9 C) 98.3 F (36.8 C) 97.6 F (36.4 C)   TempSrc: Oral Oral Oral   SpO2: 97% 96% 98% 95%  Weight: 96.9 kg     Height: 5\' 8"  (1.727 m)      No intake or output data in the 24 hours ending 09/06/21 1158 Filed Weights   09/05/21 2354  Weight: 96.9 kg    Exam:  General: 55 y.o. year-old male well developed well nourished in no acute distress.  Alert and oriented x3. Cardiovascular: Regular rate and rhythm with no rubs or gallops.  No thyromegaly or JVD noted.   Respiratory: Clear to auscultation with no wheezes or rales. Good inspiratory effort. Abdomen: Soft nontender nondistended with normal bowel sounds x4 quadrants. Musculoskeletal: No lower extremity edema. 2/4 pulses in all 4 extremities. Skin: No ulcerative lesions noted or rashes, Psychiatry: Mood is appropriate for condition and setting   Data Reviewed: CBC: Recent Labs  Lab 09/05/21 1240  WBC 9.1  HGB 15.2  HCT 47.2  MCV 90.4  PLT 250   Basic Metabolic Panel: Recent Labs  Lab 09/05/21 1240  NA 135  K 4.9  CL 99  CO2 27  GLUCOSE 102*  BUN 11  CREATININE 0.78  CALCIUM 9.5   GFR: Estimated Creatinine Clearance: 115 mL/min (by C-G formula based on SCr of 0.78 mg/dL). Liver Function Tests: No  results for input(s): AST, ALT, ALKPHOS, BILITOT, PROT, ALBUMIN in the last 168 hours. No results for input(s): LIPASE, AMYLASE in the last 168 hours. No results for input(s): AMMONIA in the last 168 hours. Coagulation Profile: No results for input(s): INR, PROTIME in the last 168 hours. Cardiac Enzymes: No results for input(s): CKTOTAL, CKMB, CKMBINDEX, TROPONINI in the last 168 hours. BNP (last 3 results) No results for input(s): PROBNP in the last 8760 hours. HbA1C: No  results for input(s): HGBA1C in the last 72 hours. CBG: No results for input(s): GLUCAP in the last 168 hours. Lipid Profile: No results for input(s): CHOL, HDL, LDLCALC, TRIG, CHOLHDL, LDLDIRECT in the last 72 hours. Thyroid Function Tests: No results for input(s): TSH, T4TOTAL, FREET4, T3FREE, THYROIDAB in the last 72 hours. Anemia Panel: No results for input(s): VITAMINB12, FOLATE, FERRITIN, TIBC, IRON, RETICCTPCT in the last 72 hours. Urine analysis:    Component Value Date/Time   COLORURINE YELLOW 09/05/2021 [place] 09/05/2021 1235   LABSPEC 1.013 09/05/2021 1235   PHURINE 5.0 09/05/2021 1235   GLUCOSEU NEGATIVE 09/05/2021 1235   HGBUR NEGATIVE 09/05/2021 [place] 09/05/2021 1235   KETONESUR NEGATIVE 09/05/2021 1235   PROTEINUR NEGATIVE 09/05/2021 1235   UROBILINOGEN 0.2 12/30/2014 1856   NITRITE NEGATIVE 09/05/2021 1235   LEUKOCYTESUR NEGATIVE 09/05/2021 1235   Sepsis Labs: @LABRCNTIP (procalcitonin:4,lacticidven:4)  ) Recent Results (from the past 240 hour(s))  Resp Panel by RT-PCR (Flu A&B, Covid) Nasopharyngeal Swab     Status: None   Collection Time: 09/05/21  6:29 PM   Specimen: Nasopharyngeal Swab; Nasopharyngeal(NP) swabs in vial transport medium  Result Value Ref Range Status   SARS Coronavirus 2 by RT PCR NEGATIVE NEGATIVE Final    Comment: (NOTE) SARS-CoV-2 target nucleic acids are NOT DETECTED.  The SARS-CoV-2 RNA is generally detectable in upper respiratory specimens during the acute phase of infection. The lowest concentration of SARS-CoV-2 viral copies this assay can detect is 138 copies/mL. A negative result does not preclude SARS-Cov-2 infection and should not be used as the sole basis for treatment or other patient management decisions. A negative result may occur with  improper specimen collection/handling, submission of specimen other than nasopharyngeal swab, presence of viral mutation(s) within the areas  targeted by this assay, and inadequate number of viral copies(<138 copies/mL). A negative result must be combined with clinical observations, patient history, and epidemiological information. The expected result is Negative.  Fact Sheet for Patients:  EntrepreneurPulse.com.au  Fact Sheet for Healthcare Providers:  IncredibleEmployment.be  This test is no t yet approved or cleared by the [place] FDA and  has been authorized for detection and/or diagnosis of SARS-CoV-2 by FDA under an Emergency Use Authorization (EUA). This EUA will remain  in effect (meaning this test can be used) for the duration of the COVID-19 declaration under Section 564(b)(1) of the Act, 21 U.S.C.section 360bbb-3(b)(1), unless the authorization is terminated  or revoked sooner.       Influenza A by PCR NEGATIVE NEGATIVE Final   Influenza B by PCR NEGATIVE NEGATIVE Final    Comment: (NOTE) The Xpert Xpress SARS-CoV-2/FLU/RSV plus assay is intended as an aid in the diagnosis of influenza from Nasopharyngeal swab specimens and should not be used as a sole basis for treatment. Nasal washings and aspirates are unacceptable for Xpert Xpress SARS-CoV-2/FLU/RSV testing.  Fact Sheet for Patients: EntrepreneurPulse.com.au  Fact Sheet for Healthcare Providers: IncredibleEmployment.be  This test is not yet approved or cleared by the [place]  States FDA and has been authorized for detection and/or diagnosis of SARS-CoV-2 by FDA under an Emergency Use Authorization (EUA). This EUA will remain in effect (meaning this test can be used) for the duration of the COVID-19 declaration under Section 564(b)(1) of the Act, 21 U.S.C. section 360bbb-3(b)(1), unless the authorization is terminated or revoked.  Performed at [facility], [place] [address] [place], [place] [address]       Studies: DG Chest 2 View  Result Date:  09/05/2021 CLINICAL DATA:  Chest pain and shortness of breath. EXAM: CHEST - 2 VIEW COMPARISON:  September 16, 2016 FINDINGS: The heart size and mediastinal contours are within normal limits. Both lungs are clear. A radiopaque fusion plate and screws are seen overlying the cervical spine. A chronic ninth left rib fracture is noted. IMPRESSION: No active cardiopulmonary disease. Electronically Signed   By: Virgina Norfolk M.D.   On: 09/05/2021 20:31   CT Head Wo Contrast  Result Date: 09/05/2021 CLINICAL DATA:  Head trauma, mod-severe head injury EXAM: CT HEAD WITHOUT CONTRAST TECHNIQUE: Contiguous axial images were obtained from the base of the skull through the vertex without intravenous contrast. COMPARISON:  None. FINDINGS: Brain: There is no acute intracranial hemorrhage, mass effect, or edema. Gray-white differentiation is preserved. There is no extra-axial fluid collection. Ventricles and sulci are within normal limits in size and configuration. Vascular: There is atherosclerotic calcification at the skull base. Skull: Calvarium is unremarkable. Sinuses/Orbits: No acute finding. Other: None. IMPRESSION: No evidence of acute intracranial injury. Electronically Signed   By: Macy Mis M.D.   On: 09/05/2021 13:56    Scheduled Meds:  amLODipine  10 mg Oral Daily   atorvastatin  40 mg Oral Daily   carvedilol  12.5 mg Oral BID WC   cyclobenzaprine  10 mg Oral QHS   enoxaparin (LOVENOX) injection  40 mg Subcutaneous J33L   folic acid  1 mg Oral Daily   losartan  50 mg Oral Daily   multivitamin with minerals  1 tablet Oral Daily   pantoprazole  40 mg Oral Daily   thiamine  100 mg Oral Daily   Or   thiamine  100 mg Intravenous Daily   venlafaxine XR  150 mg Oral q AM    Continuous Infusions:   LOS: 0 days     Kayleen Memos, MD [facility] Pager [phone]  If 7PM-7AM, please contact night-coverage [email or web address] Password [email or web address] 09/06/2021, 11:58 AM

## 2021-09-06 NOTE — Discharge Summary (Addendum)
Discharge Summary  [name] LZJ:[id number] DOB: 07-05-1966  PCP: Susy Frizzle, MD  Admit date: 09/05/2021 Discharge date: 09/06/2021  Time spent: 35 minutes.  Recommendations for Outpatient Follow-up:  Follow-up with cardiology Arrangements made for follow-up in 6 to 8 weeks by cardiology to device clinic. Follow-up with your PCP within a week Do not drive or operate heavy machinery until cleared by cardiology. Take your medications as prescribed Abstain from alcohol abuse.  Discharge Diagnoses:  Active Hospital Problems   Diagnosis Date Noted   Chest pain, rule out acute myocardial infarction 09/05/2021   Syncope 09/05/2021   ETOH abuse 12/26/2014   Essential hypertension 05/25/2009    Resolved Hospital Problems  No resolved problems to display.    Discharge Condition: Stable  Diet recommendation: Resume previous diet.  Vitals:   09/06/21 1606 09/06/21 1700  BP: 119/82   Pulse: 69   Resp: 18   Temp:    SpO2:  95%    History of present illness:   [name] is a 55 y.o. male with medical history significant of HTN, HLD, smoking.  Pt presents to ED with c/o intermittent exertional chest pain over the last few weeks.  Endorses 1 episode of syncope the evening prior to presentation.  No prodrome.  Seen by cardiology.  Status post implantable loop recorder placement on 09/06/2021 by cardiology Dr. Sallyanne Kuster due to concern for possible arrhythmia as a cause of patient's syncope.   09/06/21:  Seen and examined at bedside.  He denies chest pain.  No dyspnea.  No evidence of acute alcohol withdrawal at time of visit.  Hospital Course:  Principal Problem:   Chest pain, rule out acute myocardial infarction Active Problems:   Essential hypertension   ETOH abuse   Syncope  Resolved chest pain rule out ACS Heart score 5 No evidence of acute ischemia on twelve-lead EKG Troponin negative 2D echo done on 09/06/2021 showed normal LVEF 55 to 60%, no  regional wall motion abnormalities.  Grade 1 diastolic dysfunction. Seen by cardiology, okay to discharge from a cardiology standpoint. Denies any anginal symptoms at the time of this visit. Coronary CT angio 09/06/2021.  Findings as stated below. Continue aspirin 81 mg daily, Lipitor 40 mg daily. Follow-up with cardiology.  Syncope, rule out arrhythmia, status post implantable loop recorder placement on 09/06/2021 by Dr. Sallyanne Kuster No prodromes 2D echo findings as stated above. Follow-up appointment has been arranged by cardiology. Do not drive or operate heavy machinery until cleared by cardiology.   Essential hypertension BP is currently at goal. Continue home Norvasc 10 mg daily, Coreg 12.5 mg twice daily, losartan 50 mg daily. Follow-up with your PCP within a week  Chronic anxiety/depression Continue home Effexor  GERD Continue home PPI  Hyperlipidemia Continue home Lipitor.  Alcohol abuse No evidence of acute alcohol withdrawal at the time of this visit Continue multivitamins, thiamine and folic acid supplementation Recommend excessive alcohol use cessation. TOC consulted to provide resources for alcohol abuse.  Tobacco use disorder TOC consulted to provide resources for tobacco cessation   Coronary CT angiogram shows some nonobstructive coronary disease, but nothing that could explain syncope or exertional chest pain.  Treat with aspirin, lipid-lowering therapy, smoking cessation.   Loop recorder implanted, no immediate complications.  With the device clinic follow-up and wound check appointment in 2 weeks.   Okay to discharge from cardiology point of view.             Code Status: Full code  Family Communication: Wife at bedside    Disposition Plan:  likely will discharge to home once cardiology signs of.     Consultants: Cardiology   Procedures: 2D echo   Antimicrobials: None.  Discharge Exam: BP 119/82 (BP Location: Left Arm)   Pulse 69    Temp 97.6 F (36.4 C) (Oral)   Resp 18   Ht 5\' 8"  (1.727 m)   Wt 96.9 kg   SpO2 95%   BMI 32.48 kg/m  General: 55 y.o. year-old male well developed well nourished in no acute distress.  Alert and oriented x3. Cardiovascular: Regular rate and rhythm with no rubs or gallops.  No thyromegaly or JVD noted.   Respiratory: Clear to auscultation with no wheezes or rales. Good inspiratory effort. Abdomen: Soft nontender nondistended with normal bowel sounds x4 quadrants. Musculoskeletal: No lower extremity edema. 2/4 pulses in all 4 extremities. Skin: No ulcerative lesions noted or rashes, Psychiatry: Mood is appropriate for condition and setting  Discharge Instructions You were cared for by a hospitalist during your hospital stay. If you have any questions about your discharge medications or the care you received while you were in the hospital after you are discharged, you can call the unit and asked to speak with the hospitalist on call if the hospitalist that took care of you is not available. Once you are discharged, your primary care physician will handle any further medical issues. Please note that NO REFILLS for any discharge medications will be authorized once you are discharged, as it is imperative that you return to your primary care physician (or establish a relationship with a primary care physician if you do not have one) for your aftercare needs so that they can reassess your need for medications and monitor your lab values.   Allergies as of 09/06/2021   No Known Allergies      Medication List     STOP taking these medications    Aspirin-Caffeine 845-65 MG Pack   aspirin-sod bicarb-citric acid 325 MG Tbef tablet Commonly known as: ALKA-SELTZER       TAKE these medications    acetaminophen 500 MG tablet Commonly known as: TYLENOL Take 500 mg by mouth every 6 (six) hours as needed for moderate pain or headache.   amLODipine 10 MG tablet Commonly known as:  NORVASC Take 1 tablet (10 mg total) by mouth daily.   aspirin EC 81 MG tablet Take 1 tablet (81 mg total) by mouth daily. Swallow whole.   atorvastatin 40 MG tablet Commonly known as: LIPITOR TAKE 1 TABLET BY MOUTH EVERY DAY   Biofreeze 10.5 % Aero Generic drug: Menthol Apply 1-2 sprays topically as needed (pain).   carvedilol 12.5 MG tablet Commonly known as: COREG Take 1 tablet (12.5 mg total) by mouth 2 (two) times daily with a meal.   clonazePAM 0.5 MG tablet Commonly known as: KLONOPIN TAKE 1 TABLET BY MOUTH TWICE A DAY AS NEEDED FOR ANXIETY What changed: See the new instructions.   cyclobenzaprine 10 MG tablet Commonly known as: FLEXERIL TAKE 1 TABLET BY MOUTH EVERYDAY AT BEDTIME What changed:  how much to take how to take this when to take this additional instructions   doxycycline 100 MG tablet Commonly known as: VIBRA-TABS Take 1 tablet (100 mg total) by mouth 2 (two) times daily.   fluticasone 50 MCG/ACT nasal spray Commonly known as: FLONASE SPRAY 2 SPRAYS INTO EACH NOSTRIL EVERY DAY What changed:  how much to take how to take this when to  take this additional instructions   folic acid 1 MG tablet Commonly known as: FOLVITE Take 1 tablet (1 mg total) by mouth daily. Start taking on: September 07, 2021   losartan 50 MG tablet Commonly known as: COZAAR Take 1 tablet (50 mg total) by mouth daily.   multivitamin with minerals Tabs tablet Take 1 tablet by mouth daily. Start taking on: September 07, 2021   pantoprazole 40 MG tablet Commonly known as: PROTONIX Take 1 tablet (40 mg total) by mouth daily.   sildenafil 100 MG tablet Commonly known as: Viagra Take 1 tablet (100 mg total) by mouth daily as needed for erectile dysfunction.   thiamine 100 MG tablet Take 1 tablet (100 mg total) by mouth daily. Start taking on: September 07, 2021   venlafaxine XR 150 MG 24 hr capsule Commonly known as: EFFEXOR-XR TAKE 1 CAPSULE BY MOUTH EVERY DAY IN THE  MORNING What changed:  how much to take how to take this when to take this additional instructions       No Known Allergies  Follow-up Information     Susy Frizzle, MD. Call in 1 day(s).   Specialty: Family Medicine Why: Please call for a posthospital follow-up appointment. Contact information: [place] [address] [phone]         Croitoru, Dani Gobble, MD Follow up.   Specialty: Cardiology Why: [facility] (Cardiology) - the office will call you for your followup appointment information. Contact information: [address] [place] [address] [phone]                  The results of significant diagnostics from this hospitalization (including imaging, microbiology, ancillary and laboratory) are listed below for reference.    Significant Diagnostic Studies: DG Chest 2 View  Result Date: 09/05/2021 CLINICAL DATA:  Chest pain and shortness of breath. EXAM: CHEST - 2 VIEW COMPARISON:  September 16, 2016 FINDINGS: The heart size and mediastinal contours are within normal limits. Both lungs are clear. A radiopaque fusion plate and screws are seen overlying the cervical spine. A chronic ninth left rib fracture is noted. IMPRESSION: No active cardiopulmonary disease. Electronically Signed   By: Virgina Norfolk M.D.   On: 09/05/2021 20:31   DG Pelvis 1-2 Views  Result Date: 09/03/2021 Clinical:  SI joint pain, no trauma X-rays were done of the pelvis, AP view There are screws and rods bilateral in L5 and S1 on the AP as well as artificial disc at L5-S1.  SI joints are not distinct.  No fracture is noted.  Hips are well located.  Bone quality is good. Impression:  Post surgery of L5-S1, SI joints not well seen. Electronically Signed Sanjuana Kava, MD 10/11/20222:09 PM   CT Head Wo Contrast  Result Date: 09/05/2021 CLINICAL DATA:  Head trauma, mod-severe head injury EXAM: CT HEAD WITHOUT CONTRAST TECHNIQUE: Contiguous  axial images were obtained from the base of the skull through the vertex without intravenous contrast. COMPARISON:  None. FINDINGS: Brain: There is no acute intracranial hemorrhage, mass effect, or edema. Gray-white differentiation is preserved. There is no extra-axial fluid collection. Ventricles and sulci are within normal limits in size and configuration. Vascular: There is atherosclerotic calcification at the skull base. Skull: Calvarium is unremarkable. Sinuses/Orbits: No acute finding. Other: None. IMPRESSION: No evidence of acute intracranial injury. Electronically Signed   By: Macy Mis M.D.   On: 09/05/2021 13:56   EP PPM/ICD IMPLANT  Result Date: 09/06/2021 Loop  recorder implantation  Reason for procedure: Recurrent syncope Procedure performed by: Sanda Klein, MD Complications: None Estimated blood loss: <5 mL Medications administered during procedure: Lidocaine 1% with 1/100,000 epinephrine 10 mL locally Device details: Medtronic Reveal Linq model number [id number], serial number [id number] Procedure details: After the risks and benefits of the procedure were discussed the patient provided informed consent. The patient was prepped and draped in usual sterile fashion. Local anesthesia was administered to an area 2 cm to the left of the sternum in the 4th intercostal space. A cutaneous incision was made using the incision tool. The introducer was then used to create a subcutaneous tunnel and carefully deploy the device. Local pressure was held to ensure hemostasis. The incision was closed with SteriStrips and a sterile dressing was applied. R waves 0.29 mV    CT CORONARY MORPH W/CTA COR W/SCORE W/CA W/CM &/OR WO/CM  Addendum Date: 09/06/2021   ADDENDUM REPORT: 09/06/2021 14:59 CLINICAL DATA:  Chest pain EXAM: Cardiac CTA MEDICATIONS: Sub lingual nitro. 4mg  x 2 TECHNIQUE: The patient was scanned on a Siemens 810 slice scanner. Gantry rotation speed was 250 msecs. Collimation was 0.6 mm. A 100 kV  prospective scan was triggered in the ascending thoracic aorta at 35-75% of the R-R interval. Average HR during the scan was 60 bpm. The 3D data set was interpreted on a dedicated work station using MPR, MIP and VRT modes. A total of 80cc of contrast was used. FINDINGS: Non-cardiac: See separate report from [facility]. No LA appendage thrombus. Pulmonary veins drain normally to the left atrium. Calcium Score: 438 Agatston units. Coronary Arteries: Right dominant with no anomalies LM: No plaque or stenosis. LAD system: Mixed plaque proximal and mid LAD, mild (<50%) stenosis. Circumflex system: No plaque or stenosis. RCA system: Mixed plaque proximal and mid RCA, mild (<50%) stenosis. IMPRESSION: 1. Coronary artery calcium score 438 Agatston units. This places the patient in the 93rd percentile for age and gender, suggesting high risk for future cardiac events. 2.  Extensive but nonobstructive coronary disease. Dalton Mclean Electronically Signed   By: Loralie Champagne M.D.   On: 09/06/2021 14:59   Result Date: 09/06/2021 EXAM: OVER-READ INTERPRETATION  CT CHEST The following report is an over-read performed by radiologist Dr. Vinnie Langton of [facility], [place] on 09/06/2021. This over-read does not include interpretation of cardiac or coronary anatomy or pathology. The coronary calcium score/coronary CTA interpretation by the cardiologist is attached. COMPARISON:  None. FINDINGS: Atherosclerotic calcifications in the thoracic aorta. Within the visualized portions of the thorax there are no suspicious appearing pulmonary nodules or masses, there is no acute consolidative airspace disease, no pleural effusions, no pneumothorax and no lymphadenopathy. Visualized portions of the upper abdomen demonstrate diffuse low attenuation throughout the visualized hepatic parenchyma. There are no aggressive appearing lytic or blastic lesions noted in the visualized portions of the skeleton. IMPRESSION: 1.   Aortic Atherosclerosis (ICD10-I70.0). 2. Hepatic steatosis. Electronically Signed: By: Vinnie Langton M.D. On: 09/06/2021 14:05   ECHOCARDIOGRAM COMPLETE  Result Date: 09/06/2021    ECHOCARDIOGRAM REPORT   Patient Name:   [name] Date of Exam: 09/06/2021 Medical Rec #:  [id number]         Height:       67.7 in Accession #:    [id number]        Weight:       213.6 lb Date of Birth:  [birth year]        BSA:  2.096 m Patient Age:    4 years          BP:           128/85 mmHg Patient Gender: M                 HR:           69 bpm. Exam Location:  Inpatient Procedure: 2D Echo, Cardiac Doppler and Color Doppler Indications:    chest pain  History:        Patient has no prior history of Echocardiogram examinations.                 Signs/Symptoms:Syncope; Risk Factors:Hypertension.  Sonographer:    Meagan Baucom RDCS, FE, PE Referring Phys: [place]  1. Left ventricular ejection fraction, by estimation, is 55 to 60%. The left ventricle has normal function. The left ventricle has no regional wall motion abnormalities. There is mild left ventricular hypertrophy. Left ventricular diastolic parameters are consistent with Grade I diastolic dysfunction (impaired relaxation).  2. Right ventricular systolic function is normal. The right ventricular size is normal. Tricuspid regurgitation signal is inadequate for assessing PA pressure.  3. The mitral valve is normal in structure. Trivial mitral valve regurgitation. No evidence of mitral stenosis.  4. The aortic valve is tricuspid. Aortic valve regurgitation is not visualized. No aortic stenosis is present.  5. The inferior vena cava is normal in size with greater than 50% respiratory variability, suggesting right atrial pressure of 3 mmHg. FINDINGS  Left Ventricle: Left ventricular ejection fraction, by estimation, is 55 to 60%. The left ventricle has normal function. The left ventricle has no regional wall motion abnormalities. The  left ventricular internal cavity size was normal in size. There is  mild left ventricular hypertrophy. Left ventricular diastolic parameters are consistent with Grade I diastolic dysfunction (impaired relaxation). Right Ventricle: The right ventricular size is normal. No increase in right ventricular wall thickness. Right ventricular systolic function is normal. Tricuspid regurgitation signal is inadequate for assessing PA pressure. Left Atrium: Left atrial size was normal in size. Right Atrium: Right atrial size was normal in size. Pericardium: There is no evidence of pericardial effusion. Presence of pericardial fat pad. Mitral Valve: The mitral valve is normal in structure. Trivial mitral valve regurgitation. No evidence of mitral valve stenosis. Tricuspid Valve: The tricuspid valve is normal in structure. Tricuspid valve regurgitation is trivial. No evidence of tricuspid stenosis. Aortic Valve: The aortic valve is tricuspid. Aortic valve regurgitation is not visualized. No aortic stenosis is present. Pulmonic Valve: The pulmonic valve was normal in structure. Pulmonic valve regurgitation is not visualized. No evidence of pulmonic stenosis. Aorta: The aortic root is normal in size and structure. Venous: The inferior vena cava is normal in size with greater than 50% respiratory variability, suggesting right atrial pressure of 3 mmHg. IAS/Shunts: No atrial level shunt detected by color flow Doppler.  LEFT VENTRICLE PLAX 2D LVIDd:         4.55 cm   Diastology LVIDs:         3.10 cm   LV e' medial:    4.68 cm/s LV PW:         1.05 cm   LV E/e' medial:  14.7 LV IVS:        1.05 cm   LV e' lateral:   8.59 cm/s LVOT diam:     2.10 cm   LV E/e' lateral: 8.0 LV SV:  75 LV SV Index:   36 LVOT Area:     3.46 cm  RIGHT VENTRICLE RV S prime:     8.81 cm/s TAPSE (M-mode): 1.9 cm LEFT ATRIUM             Index        RIGHT ATRIUM           Index LA diam:        3.60 cm 1.72 cm/m   RA Area:     12.00 cm LA Vol (A2C):    41.7 ml 19.90 ml/m  RA Volume:   28.10 ml  13.41 ml/m LA Vol (A4C):   29.4 ml 14.03 ml/m LA Biplane Vol: 37.4 ml 17.85 ml/m  AORTIC VALVE LVOT Vmax:   99.83 cm/s LVOT Vmean:  68.359 cm/s LVOT VTI:    0.217 m  AORTA Ao Root diam: 3.10 cm Ao Asc diam:  2.80 cm MITRAL VALVE MV Area (PHT): 2.76 cm    SHUNTS MV Decel Time: 275 msec    Systemic VTI:  0.22 m MV E velocity: 68.60 cm/s  Systemic Diam: 2.10 cm MV A velocity: 95.10 cm/s MV E/A ratio:  0.72 Cherlynn Kaiser MD Electronically signed by Cherlynn Kaiser MD Signature Date/Time: 09/06/2021/1:56:29 PM    Final     Microbiology: Recent Results (from the past 240 hour(s))  Resp Panel by RT-PCR (Flu A&B, Covid) Nasopharyngeal Swab     Status: None   Collection Time: 09/05/21  6:29 PM   Specimen: Nasopharyngeal Swab; Nasopharyngeal(NP) swabs in vial transport medium  Result Value Ref Range Status   SARS Coronavirus 2 by RT PCR NEGATIVE NEGATIVE Final    Comment: (NOTE) SARS-CoV-2 target nucleic acids are NOT DETECTED.  The SARS-CoV-2 RNA is generally detectable in upper respiratory specimens during the acute phase of infection. The lowest concentration of SARS-CoV-2 Jonathon copies this assay can detect is 138 copies/mL. A negative result does not preclude SARS-Cov-2 infection and should not be used as the sole basis for treatment or other patient management decisions. A negative result may occur with  improper specimen collection/handling, submission of specimen other than nasopharyngeal swab, presence of Jonathon mutation(s) within the areas targeted by this assay, and inadequate number of Jonathon copies(<138 copies/mL). A negative result must be combined with clinical observations, patient history, and epidemiological information. The expected result is Negative.  Fact Sheet for Patients:  EntrepreneurPulse.com.au  Fact Sheet for Healthcare Providers:  IncredibleEmployment.be  This test is no t yet approved  or cleared by the [place] FDA and  has been authorized for detection and/or diagnosis of SARS-CoV-2 by FDA under an Emergency Use Authorization (EUA). This EUA will remain  in effect (meaning this test can be used) for the duration of the COVID-19 declaration under Section 564(b)(1) of the Act, 21 U.S.C.section 360bbb-3(b)(1), unless the authorization is terminated  or revoked sooner.       Influenza A by PCR NEGATIVE NEGATIVE Final   Influenza B by PCR NEGATIVE NEGATIVE Final    Comment: (NOTE) The Xpert Xpress SARS-CoV-2/FLU/RSV plus assay is intended as an aid in the diagnosis of influenza from Nasopharyngeal swab specimens and should not be used as a sole basis for treatment. Nasal washings and aspirates are unacceptable for Xpert Xpress SARS-CoV-2/FLU/RSV testing.  Fact Sheet for Patients: EntrepreneurPulse.com.au  Fact Sheet for Healthcare Providers: IncredibleEmployment.be  This test is not yet approved or cleared by the [place] FDA and has been authorized for detection and/or diagnosis of SARS-CoV-2 by  FDA under an Emergency Use Authorization (EUA). This EUA will remain in effect (meaning this test can be used) for the duration of the COVID-19 declaration under Section 564(b)(1) of the Act, 21 U.S.C. section 360bbb-3(b)(1), unless the authorization is terminated or revoked.  Performed at [facility], [place] [address] [place], [place] [address]      Labs: Basic Metabolic Panel: Recent Labs  Lab 09/05/21 1240  NA 135  K 4.9  CL 99  CO2 27  GLUCOSE 102*  BUN 11  CREATININE 0.78  CALCIUM 9.5   Liver Function Tests: No results for input(s): AST, ALT, ALKPHOS, BILITOT, PROT, ALBUMIN in the last 168 hours. No results for input(s): LIPASE, AMYLASE in the last 168 hours. No results for input(s): AMMONIA in the last 168 hours. CBC: Recent Labs  Lab 09/05/21 1240  WBC 9.1  HGB 15.2  HCT 47.2  MCV 90.4   PLT 256   Cardiac Enzymes: No results for input(s): CKTOTAL, CKMB, CKMBINDEX, TROPONINI in the last 168 hours. BNP: BNP (last 3 results) No results for input(s): BNP in the last 8760 hours.  ProBNP (last 3 results) No results for input(s): PROBNP in the last 8760 hours.  CBG: No results for input(s): GLUCAP in the last 168 hours.     Signed:  Kayleen Memos, MD [facility] 09/06/2021, 6:26 PM

## 2021-09-06 NOTE — Op Note (Signed)
LOOP RECORDER IMPLANT   Procedure report  Procedure performed:  Loop recorder implantation   Reason for procedure:  Recurrent syncope Procedure performed by:  Sanda Klein, MD  Complications:  None  Estimated blood loss:  <5 mL  Medications administered during procedure:  Lidocaine 1% with 1/100,000 epinephrine 10 mL locally Device details:  Medtronic Reveal Linq model number [id number], serial number [id number] Procedure details:  After the risks and benefits of the procedure were discussed the patient provided informed consent. The patient was prepped and draped in usual sterile fashion. Local anesthesia was administered to an area 2 cm to the left of the sternum in the 4th intercostal space. A cutaneous incision was made using the incision tool. The introducer was then used to create a subcutaneous tunnel and carefully deploy the device. Local pressure was held to ensure hemostasis.  The incision was closed with SteriStrips and a sterile dressing was applied.  R waves 0.29 mV  Sanda Klein, MD, [facility] [phone] office [phone] pager 09/06/2021 4:00 PM

## 2021-09-06 NOTE — Progress Notes (Signed)
Coronary CT angiogram shows some nonobstructive coronary disease, but nothing that could explain syncope or exertional chest pain.  Treat with aspirin, lipid-lowering therapy, smoking cessation.  Loop recorder implanted, no immediate complications.  With the device clinic follow-up and wound check appointment in 2 weeks.  Okay to discharge from cardiology point of view.

## 2021-09-06 NOTE — Evaluation (Signed)
Physical Therapy Evaluation/Discharge Patient Details Name: [name] MRN: [id number] DOB: Sep 15, 1966 Today's Date: 09/06/2021  History of Present Illness  Pt is a 55 y.o. male who presented 09/05/21 with complaints of intermittent CP and a syncope episode the prior night. Troponin was normal and EKG shoed noraml sinus rhythm.  CT of the head shows no acute intracranial abnormality. Coronary CT angiogram shows some nonobstructive coronary disease. S/p loop recorder implantation 10/14. PMH: HTN, HLD, smoking.   Clinical Impression  Pt presents with condition above. PTA, he was independent and living with his wife in a 1-level house with 2 STE. Pt denies any falls except the syncope episodes, in which pt denies any symptoms of lightheadedness, headache, blurred vision, etc leading up to his syncope episodes. Educated pt to create log of stress, position, and any sensations leading up to syncope episodes and to sit/recline immediately if ever feel lightheaded. Currently, pt is at his baseline, not needing any assistance or UE support and not displaying any LOB with all mobility, including dynamic gait challenges, and stairs. Pt's strength, sensation, and coordination are intact in all limbs. Vital signs were stable throughout with pt denying any dizziness, lightheadedness, or spinning throughout session and all changes in position, see below. All education completed and questions answered. PT will sign off. Thank you for this referral.  SpO2 >/= 94% on RA throughout HR 73 - 99 bpm  BP: 119/77 supine 109/79 sitting 111/76 standing 124/81 standing 3 minutes 131/78 standing after ambulating   Recommendations for follow up therapy are one component of a multi-disciplinary discharge planning process, led by the attending physician.  Recommendations may be updated based on patient status, additional functional criteria and insurance authorization.  Follow Up Recommendations No PT follow up     Equipment Recommendations  None recommended by PT    Recommendations for Other Services       Precautions / Restrictions Precautions Precautions: Other (comment) Precaution Comments: syncope episodes without warning Restrictions Weight Bearing Restrictions: No      Mobility  Bed Mobility Overal bed mobility: Independent             General bed mobility comments: Pt able to transition supine <> sit without assistance or noted difficulty.    Transfers Overall transfer level: Independent Equipment used: None             General transfer comment: Pt transfers to stand in appropriate time frame without unsteadiness or assistance.  Ambulation/Gait Ambulation/Gait assistance: Independent Gait Distance (Feet): 350 Feet Assistive device: None Gait Pattern/deviations: WFL(Within Functional Limits) Gait velocity: WNL Gait velocity interpretation: >4.37 ft/sec, indicative of normal walking speed General Gait Details: Pt with appropriate speed and stride length along with stance width. No LOB, even when performing dynamic gait challenges such as changing head positions, changing speeds, changing directions, and suddenly stopping.  Stairs Stairs: Yes Stairs assistance: Supervision Stair Management: One rail Left;No rails;Alternating pattern;Forwards Number of Stairs: 10 General stair comments: Ascends and descends with reciprocal pattern either with L UE support on rail or no UE support, appropriate speed, no LOB, supervision for safety.  Wheelchair Mobility    Modified Rankin (Stroke Patients Only) Modified Rankin (Stroke Patients Only) Pre-Morbid Rankin Score: No symptoms Modified Rankin: No symptoms     Balance Overall balance assessment: Independent  Pertinent Vitals/Pain Pain Assessment: No/denies pain    Home Living Family/patient expects to be discharged to:: Private residence Living  Arrangements: Spouse/significant other Available Help at Discharge: Family;Available 24 hours/day Type of Home: House Home Access: Stairs to enter Entrance Stairs-Rails: Right (ascending) Entrance Stairs-Number of Steps: 2 Home Layout: One level Home Equipment: None      Prior Function Level of Independence: Independent         Comments: Denies other falls except similar situation as this one. Pt drives. Pt used to be a Air traffic controller.     Hand Dominance   Dominant Hand: Right    Extremity/Trunk Assessment   Upper Extremity Assessment Upper Extremity Assessment: Overall WFL for tasks assessed (Gross MMT scores of 5/5 bil, sensation intact bil, coordination grossly intact bil)    Lower Extremity Assessment Lower Extremity Assessment: Overall WFL for tasks assessed (Gross MMT scores of 5/5 bil, sensation intact bil, coordination grossly intact bil)    Cervical / Trunk Assessment Cervical / Trunk Assessment: Normal  Communication   Communication: No difficulties  Cognition Arousal/Alertness: Awake/alert Behavior During Therapy: WFL for tasks assessed/performed Overall Cognitive Status: Within Functional Limits for tasks assessed                                 General Comments: A&Ox4.      General Comments General comments (skin integrity, edema, etc.): SpO2 >/= 94% on RA throughout; HR 73 - 99; BP 119/77 supine, 109/79 sitting, 111/76 standing, 124/81 standing 3 minutes, 131/78 standing after ambulating; denied any dizziness, lightheadedness, or spinning throughout session and all changes in position; educated pt to create log of stress, position, and any sensations leading up to syncope episodes and to sit/recline immediately if ever feel lightheaded; pt denies any symptoms of lightheadedness, headache, blurred vision, etc leading up to his syncope episodes    Exercises     Assessment/Plan    PT Assessment Patent does not need any further PT  services  PT Problem List         PT Treatment Interventions      PT Goals (Current goals can be found in the Care Plan section)  Acute Rehab PT Goals Patient Stated Goal: to figure out why he has syncope episodes PT Goal Formulation: All assessment and education complete, DC therapy Time For Goal Achievement: 09/07/21 Potential to Achieve Goals: Good    Frequency     Barriers to discharge        Co-evaluation               AM-PAC PT "6 Clicks" Mobility  Outcome Measure Help needed turning from your back to your side while in a flat bed without using bedrails?: None Help needed moving from lying on your back to sitting on the side of a flat bed without using bedrails?: None Help needed moving to and from a bed to a chair (including a wheelchair)?: None Help needed standing up from a chair using your arms (e.g., wheelchair or bedside chair)?: None Help needed to walk in hospital room?: None Help needed climbing 3-5 steps with a railing? : A Little 6 Click Score: 23    End of Session   Activity Tolerance: Patient tolerated treatment well Patient left: in bed;with call bell/phone within reach;with family/visitor present Nurse Communication: Mobility status PT Visit Diagnosis: Repeated falls (R29.6)    Time: 9381-0175 PT Time Calculation (min) (ACUTE ONLY): 20 min  Charges:   PT Evaluation $PT Eval Low Complexity: 1 Low          Moishe Spice, PT, DPT Acute Rehabilitation Services  Pager: [phone] Office: [place] 09/06/2021, 6:32 PM

## 2021-09-06 NOTE — Progress Notes (Signed)
Dr. Sallyanne Kuster requested arrangement of f/u in 6-8 weeks. He already sent device f/u request to the device clinic. I have sent a message to our office's scheduling team requesting a follow-up appointment in 6-8 weeks, and our office will call the patient with this information. Added FYI to AVS so patient aware.

## 2021-09-06 NOTE — Progress Notes (Signed)
Patient returned from cath lab s/p loop recorder placement. Dressing to left chest CDI.

## 2021-09-06 NOTE — Consult Note (Addendum)
Cardiology Consultation:   Patient ID: [name] MRN: [id number]; DOB: 10/13/1966  Admit date: 09/05/2021 Date of Consult: 09/06/2021  PCP:  Susy Frizzle, MD   [facility] Providers Cardiologist:  seen remotely by Dr. Einar Gip   Patient Profile:   [name] is a 55 y.o. [name] with a hx of hypertension, hyperlipidemia, tobacco and alcohol abuse who is being seen 09/06/2021 for the evaluation of chest pain and recurrent syncope at the request of Dr. Nevada Crane.  History of Present Illness:   [name] is a 55 year old [name] was past medical history of hypertension, hyperlipidemia, tobacco and alcohol abuse.  Although he has significant family history of stroke, he himself has never been diagnosed with CAD or stroke.  He has smoked 1-1/2 pack/day since age 55.  He also drinks about 2 cases (24 of 12 oz beer per case) of beers every week.  Patient was seen remotely by Dr. Einar Gip on 09/05/2016 and underwent a nuclear stress test which came back normal with EF 78%.  He never went back to see Dr. Einar Gip again.  About 6 to 8 months ago, he had a syncopal event, however he never sought medical attention.  He says he was sitting on the front porch smoking when he suddenly passed out.  He did not remember any prodromal symptoms such as dizziness or flushing sensation.  He usually did do not get any dizziness when he tries to get up in the morning.  In the past 2 weeks, he has been noticing increasing dyspnea and left-sided chest discomfort every time he exerted himself.  Symptoms last anywhere from 10 minutes to 1 hour.  He does admit he does not do much activity.  Wednesday evening, his wife went to work around 10 PM.  She works third shift.  Around 10:45, patient called his wife complaining of another syncopal event.  He is not sure how long he passed out.  He says the circumstances similar to the previous syncope, he was sitting on the front porch smoking when he suddenly passed out.  He  does not remember going down however this time he did felt forward onto his forehead and woke up on the floor later.  He sought medical attention at [facility].  Initial troponin obtained around the following noon came back normal.  EKG showed normal sinus rhythm, Q waves in the anterior leads, T wave inversion in the lateral leads.  When compared to the previous EKG in March 2020, this is not significantly changed.  COVID test negative.  Urinalysis normal.  CT of the head shows no acute intracranial abnormality.  Cardiology service has been consulted for chest pain and recurrent syncope.  Note, patient was previously seen by Dr. Einar Gip in 2017, I offered both the patient and his wife to contact Dr. Einar Gip.  However patient says he really does not have any preference regarding cardiologist and was okay to be seen by me.   Past Medical History:  Diagnosis Date   Allergy    Anemia    Colonic diverticular abscess 12/26/2014   Diverticulitis    ETOH abuse    GAD (generalized anxiety disorder)    GERD (gastroesophageal reflux disease)    Hypercholesteremia    Hypertension    Left rotator cuff tear 02/08/2019   Superior labrum anterior-to-posterior (SLAP) tear of left shoulder 02/08/2019   Tobacco abuse     Past Surgical History:  Procedure Laterality Date   APPENDECTOMY N/A 01/01/2015  Procedure: INCIDENTAL APPENDECTOMY;  Surgeon: Jamesetta So, MD;  Location: [facility];  Service: General;  Laterality: N/A;   BACK SURGERY     lower back, fusion   COLONOSCOPY WITH PROPOFOL N/A 12/21/2017   Procedure: COLONOSCOPY WITH PROPOFOL;  Surgeon: Daneil Dolin, MD;  Location: [facility];  Service: Endoscopy;  Laterality: N/A;  9:45am   GASTRECTOMY N/A 04/14/2016   Procedure: GASTRORRHAPHY;  Surgeon: Aviva Signs, MD;  Location: [facility];  Service: General;  Laterality: N/A;   [place]     as child   PARTIAL COLECTOMY N/A 01/01/2015   Procedure: PARTIAL COLECTOMY;   Surgeon: Jamesetta So, MD;  Location: [facility];  Service: General;  Laterality: N/A;   POLYPECTOMY  12/21/2017   Procedure: POLYPECTOMY;  Surgeon: Daneil Dolin, MD;  Location: [facility];  Service: Endoscopy;;  colon   ROTATOR CUFF REPAIR Bilateral    SHOULDER ARTHROSCOPY WITH ROTATOR CUFF REPAIR AND SUBACROMIAL DECOMPRESSION Left 02/09/2019   Procedure: SHOULDER ARTHROSCOPY WITH ROTATOR CUFF REPAIR AND SUBACROMIAL PARTIAL ACROMIOPLASTY, DISTAL CLAVICULECTOMY, AND EXTENSIVE DEBRIDEMENT;  Surgeon: Elsie Saas, MD;  Location: [place];  Service: Orthopedics;  Laterality: Left;     Home Medications:  Prior to Admission medications   Medication Sig Start Date End Date Taking? Authorizing Provider  acetaminophen (TYLENOL) 500 MG tablet Take 500 mg by mouth every 6 (six) hours as needed for moderate pain or headache.   Yes [provider]  amLODipine (NORVASC) 10 MG tablet Take 1 tablet (10 mg total) by mouth daily. 03/21/21  Yes Susy Frizzle, MD  Aspirin-Caffeine [phone] MG PACK Take 1 packet by mouth as needed (headache).   Yes [provider]  aspirin-sod bicarb-citric acid (ALKA-SELTZER) 325 MG TBEF tablet Take 325 mg by mouth every 6 (six) hours as needed (upset stomach).   Yes [provider]  atorvastatin (LIPITOR) 40 MG tablet TAKE 1 TABLET BY MOUTH EVERY DAY Patient taking differently: Take 40 mg by mouth daily. 03/25/18  Yes Susy Frizzle, MD  carvedilol (COREG) 12.5 MG tablet Take 1 tablet (12.5 mg total) by mouth 2 (two) times daily with a meal. 03/21/21  Yes Pickard, Cammie Mcgee, MD  clonazePAM (KLONOPIN) 0.5 MG tablet TAKE 1 TABLET BY MOUTH TWICE A DAY AS NEEDED FOR ANXIETY Patient taking differently: Take 0.5 mg by mouth 2 (two) times daily as needed for anxiety. 07/15/21  Yes Susy Frizzle, MD  cyclobenzaprine (FLEXERIL) 10 MG tablet TAKE 1 TABLET BY MOUTH EVERYDAY AT BEDTIME Patient taking differently: Take 10 mg by mouth at  bedtime. 10/02/20  Yes Susy Frizzle, MD  doxycycline (VIBRA-TABS) 100 MG tablet Take 1 tablet (100 mg total) by mouth 2 (two) times daily. 08/29/21  Yes Susy Frizzle, MD  fluticasone (FLONASE) 50 MCG/ACT nasal spray SPRAY 2 SPRAYS INTO EACH NOSTRIL EVERY DAY Patient taking differently: Place 2 sprays into both nostrils daily. 01/31/21  Yes Susy Frizzle, MD  losartan (COZAAR) 50 MG tablet Take 1 tablet (50 mg total) by mouth daily. 03/21/21  Yes Susy Frizzle, MD  Menthol (BIOFREEZE) 10.5 % AERO Apply 1-2 sprays topically as needed (pain).   Yes [provider]  pantoprazole (PROTONIX) 40 MG tablet Take 1 tablet (40 mg total) by mouth daily. 08/29/21  Yes Susy Frizzle, MD  sildenafil (VIAGRA) 100 MG tablet Take 1 tablet (100 mg total) by mouth daily as needed for erectile dysfunction. 04/01/21  Yes Susy Frizzle, MD  venlafaxine XR (EFFEXOR-XR) 150 MG 24 hr capsule TAKE 1 CAPSULE BY MOUTH EVERY DAY IN THE MORNING Patient taking differently: Take 150 mg by mouth in the morning. 03/11/21  Yes Susy Frizzle, MD    Inpatient Medications: Scheduled Meds:  amLODipine  10 mg Oral Daily   atorvastatin  40 mg Oral Daily   carvedilol  12.5 mg Oral BID WC   cyclobenzaprine  10 mg Oral QHS   enoxaparin (LOVENOX) injection  40 mg Subcutaneous [id number]   folic acid  1 mg Oral Daily   losartan  50 mg Oral Daily   multivitamin with minerals  1 tablet Oral Daily   pantoprazole  40 mg Oral Daily   thiamine  100 mg Oral Daily   Or   thiamine  100 mg Intravenous Daily   venlafaxine XR  150 mg Oral q AM   Continuous Infusions:  PRN Meds: acetaminophen, fluticasone, LORazepam **OR** LORazepam, ondansetron (ZOFRAN) IV  Allergies:   No Known Allergies  Social History:   Social History   Socioeconomic History   Marital status: Married    Spouse name: Not on file   Number of children: Not on file   Years of education: Not on file   Highest education level: Not on file   Occupational History   Not on file  Tobacco Use   Smoking status: Every Day    Packs/day: 1.00    Years: 35.00    Pack years: 35.00    Types: Cigarettes   Smokeless tobacco: Never   Tobacco comments:    1.5 ppd since age 87.   Vaping Use   Vaping Use: Never used  Substance and Sexual Activity   Alcohol use: Yes    Alcohol/week: 0.0 standard drinks    Comment: patient states "about 5 or 6 a day" (beers)   Drug use: No   Sexual activity: Yes  Other Topics Concern   Not on file  Social History Narrative   Not on file   Social Determinants of Health   Financial Resource Strain: Not on file  Food Insecurity: Not on file  Transportation Needs: Not on file  Physical Activity: Not on file  Stress: Not on file  Social Connections: Not on file  Intimate Partner Violence: Not on file    Family History:    Family History  Problem Relation Age of Onset   Diabetes Mother    Stroke Mother    Stroke Father    CAD Father    Colon cancer Neg Hx      ROS:  Please see the history of present illness.   All other ROS reviewed and negative.     Physical Exam/Data:   Vitals:   09/05/21 2320 09/05/21 2354 09/06/21 0547 09/06/21 0839  BP: (!) 148/99 (!) 157/92 (!) 146/84 (!) 137/95  Pulse: 80 78 81 75  Resp:  18 18 18   Temp:  98.4 F (36.9 C) 98.3 F (36.8 C) 97.6 F (36.4 C)  TempSrc:  Oral Oral Oral  SpO2:  97% 96% 98%  Weight:  96.9 kg    Height:  5\' 8"  (1.727 m)     No intake or output data in the 24 hours ending 09/06/21 1131 Last 3 Weights 09/05/2021 09/03/2021 08/29/2021  Weight (lbs) 213 lb 9.6 oz 217 lb 6.4 oz 217 lb  Weight (kg) 96.888 kg 98.612 kg 98.431 kg     Body mass index is 32.48 kg/m.  General:  Well nourished, well developed, in no acute distress HEENT: normal Neck: no JVD Vascular: No carotid bruits; Distal pulses 2+ bilaterally Cardiac:  normal S1, S2; RRR; no murmur  Lungs:  clear to auscultation bilaterally, no wheezing, rhonchi or rales   Abd: soft, nontender, no hepatomegaly  Ext: no edema Musculoskeletal:  No deformities, BUE and BLE strength normal and equal Skin: warm and dry  Neuro:  CNs 2-12 intact, no focal abnormalities noted Psych:  Normal affect   EKG:  The EKG was personally reviewed and demonstrates: Normal sinus rhythm, Q waves in the anterior leads, T wave inversion in the lateral leads. Telemetry:  Telemetry was personally reviewed and demonstrates: Normal sinus rhythm, no significant arrhythmia to explain the recent syncope.  No significant ventricular ectopy.  Relevant CV Studies:  Pending echocardiogram  Laboratory Data:  High Sensitivity Troponin:   Recent Labs  Lab 09/05/21 1240  TROPONINIHS 5     Chemistry Recent Labs  Lab 09/05/21 1240  NA 135  K 4.9  CL 99  CO2 27  GLUCOSE 102*  BUN 11  CREATININE 0.78  CALCIUM 9.5  GFRNONAA >60  ANIONGAP 9    No results for input(s): PROT, ALBUMIN, AST, ALT, ALKPHOS, BILITOT in the last 168 hours. Lipids No results for input(s): CHOL, TRIG, HDL, LABVLDL, LDLCALC, CHOLHDL in the last 168 hours.  Hematology Recent Labs  Lab 09/05/21 1240  WBC 9.1  RBC 5.22  HGB 15.2  HCT 47.2  MCV 90.4  MCH 29.1  MCHC 32.2  RDW 14.8  PLT 256   Thyroid No results for input(s): TSH, FREET4 in the last 168 hours.  BNPNo results for input(s): BNP, PROBNP in the last 168 hours.  DDimer No results for input(s): DDIMER in the last 168 hours.   Radiology/Studies:  DG Chest 2 View  Result Date: 09/05/2021 CLINICAL DATA:  Chest pain and shortness of breath. EXAM: CHEST - 2 VIEW COMPARISON:  September 16, 2016 FINDINGS: The heart size and mediastinal contours are within normal limits. Both lungs are clear. A radiopaque fusion plate and screws are seen overlying the cervical spine. A chronic ninth left rib fracture is noted. IMPRESSION: No active cardiopulmonary disease. Electronically Signed   By: Virgina Norfolk M.D.   On: 09/05/2021 20:31   DG Pelvis 1-2  Views  Result Date: 09/03/2021 Clinical:  SI joint pain, no trauma X-rays were done of the pelvis, AP view There are screws and rods bilateral in L5 and S1 on the AP as well as artificial disc at L5-S1.  SI joints are not distinct.  No fracture is noted.  Hips are well located.  Bone quality is good. Impression:  Post surgery of L5-S1, SI joints not well seen. Electronically Signed Sanjuana Kava, MD 10/11/20222:09 PM   CT Head Wo Contrast  Result Date: 09/05/2021 CLINICAL DATA:  Head trauma, mod-severe head injury EXAM: CT HEAD WITHOUT CONTRAST TECHNIQUE: Contiguous axial images were obtained from the base of the skull through the vertex without intravenous contrast. COMPARISON:  None. FINDINGS: Brain: There is no acute intracranial hemorrhage, mass effect, or edema. Gray-white differentiation is preserved. There is no extra-axial fluid collection. Ventricles and sulci are within normal limits in size and configuration. Vascular: There is atherosclerotic calcification at the skull base. Skull: Calvarium is unremarkable. Sinuses/Orbits: No acute finding. Other: None. IMPRESSION: No evidence of acute intracranial injury. Electronically Signed   By: Macy Mis M.D.   On: 09/05/2021 13:56     Assessment and  Plan:   Exertional chest discomfort: Associated with dyspnea on exertion.  Symptom is quite concerning for stable angina.  Symptom relieved by rest.   - Pending echocardiogram - discussed with MD who reviewed the raw image of the echo, normal EF, will order coronary CT instead.   Recurrent syncope: Had a syncope around 10:30 PM on 09/04/2021 while smoking on the front porch.  He denies any prodromal symptoms such as chest pain, dizziness, or flushing sensation.  He had a similar event roughly about 6 to 8 months ago.  He did not seek medical attention at the time.  Troponin negative x1.  EKG shows Q waves in the anterior leads with T wave inversion in the lateral leads, this is unchanged when  compared to the previous EKG from 2020.  The sudden onset without prodromal symptom is concerning for arrhythmia.  Consider ischemic work-up first, if negative, will need to consider a heart monitor as outpatient.  Need to cut back on alcohol  Hypertension: Continue home amlodipine, carvedilol and losartan  Hyperlipidemia: On Lipitor  Tobacco abuse: Smoke 1.5 pack per day since age 55.  Tobacco cessation strongly recommended.  Alcohol abuse: Drinks about 2 cases (24 of 12 oz beer per case) of beer every week.  Need to cut back as well.    Risk Assessment/Risk Scores:     HEAR Score (for undifferentiated chest pain):  HEAR Score: 4          For questions or updates, please contact [place] Please consult [email or web address] for contact info under    Signed, Almyra Deforest, [place]  09/06/2021 11:31 AM   I have seen and examined the patient along with Almyra Deforest, PA.  I have reviewed the chart, notes and new data.  I agree with PA/NP's note.  Key new complaints: seems to have two CV complaints, that may or may not be related. Has exertional chest pain (but also has chest pain some times at rest, sometimes when bending over). Has had 2 syncopal events 6 months apart, both completely unheralded, at rest, the last with subsequent head injury.  Key examination changes: normal CV exam. Ecchymosis R supraorbital area. Key new findings / data: ECG shows NSR, PRWP, subtle ST depression and T wave inversion in the lateral leads, unchanged from 2008 and 2014 tracings. Echo (my review) is normal except for some criteria for diastolic dysfunction, but there is normal regional and global LV function, normal RV function, no valvular abnormalities. Review of old CT scans does show some scanty atherosclerotic calcifications in the descending aorta, but not in the coronary territory. No arrhythmia on overnight telemetry.  PLAN: Atypical chest pain with low risk echo and biomarkers and chronic ECG abnormalities.  Check coronary CT angio. Recurrent unheralded syncope: if no meaningful CAD is identified, plan implantable loop recorder. We would be very unlikely to identify an arrhythmia with a wearable monitor. Continue atorvastatin (most recent LDL in March was 111, but in years past was 41-60, so suspect some noncompliance w statin). Recommend smoking cessation and marked reduction in alcohol intake. He drinks about 4x more than maximum dose recommended to avoid serious complications.  Sanda Klein, MD, [place] [phone] 09/06/2021, 12:41 PM

## 2021-09-06 NOTE — H&P (View-Only) (Signed)
Cardiology Consultation:   Patient ID: [name] MRN: [id number]; DOB: 07/28/1966  Admit date: 09/05/2021 Date of Consult: 09/06/2021  PCP:  Susy Frizzle, MD   [facility] Providers Cardiologist:  seen remotely by Dr. Einar Gip   Patient Profile:   [name] is a 55 y.o. male with a hx of hypertension, hyperlipidemia, tobacco and alcohol abuse who is being seen 09/06/2021 for the evaluation of chest pain and recurrent syncope at the request of Dr. [place] Crane.  History of Present Illness:   [name] is a 55 year old male was past medical history of hypertension, hyperlipidemia, tobacco and alcohol abuse.  Although he has significant family history of stroke, he himself has never been diagnosed with CAD or stroke.  He has smoked 1-1/2 pack/day since age 63.  He also drinks about 2 cases (24 of 12 oz beer per case) of beers every week.  Patient was seen remotely by Dr. Einar Gip on 09/05/2016 and underwent a nuclear stress test which came back normal with EF 78%.  He never went back to see Dr. Einar Gip again.  About 6 to 8 months ago, he had a syncopal event, however he never sought medical attention.  He says he was sitting on the front porch smoking when he suddenly passed out.  He did not remember any prodromal symptoms such as dizziness or flushing sensation.  He usually did do not get any dizziness when he tries to get up in the morning.  In the past 2 weeks, he has been noticing increasing dyspnea and left-sided chest discomfort every time he exerted himself.  Symptoms last anywhere from 10 minutes to 1 hour.  He does admit he does not do much activity.  Wednesday evening, his wife went to work around 10 PM.  She works third shift.  Around 10:45, patient called his wife complaining of another syncopal event.  He is not sure how long he passed out.  He says the circumstances similar to the previous syncope, he was sitting on the front porch smoking when he suddenly passed out.  He  does not remember going down however this time he did felt forward onto his forehead and woke up on the floor later.  He sought medical attention at [facility].  Initial troponin obtained around the following noon came back normal.  EKG showed normal sinus rhythm, Q waves in the anterior leads, T wave inversion in the lateral leads.  When compared to the previous EKG in March 2020, this is not significantly changed.  COVID test negative.  Urinalysis normal.  CT of the head shows no acute intracranial abnormality.  Cardiology service has been consulted for chest pain and recurrent syncope.  Note, patient was previously seen by Dr. Einar Gip in 2017, I offered both the patient and his wife to contact Dr. Einar Gip.  However patient says he really does not have any preference regarding cardiologist and was okay to be seen by me.   Past Medical History:  Diagnosis Date   Allergy    Anemia    Colonic diverticular abscess 12/26/2014   Diverticulitis    ETOH abuse    GAD (generalized anxiety disorder)    GERD (gastroesophageal reflux disease)    Hypercholesteremia    Hypertension    Left rotator cuff tear 02/08/2019   Superior labrum anterior-to-posterior (SLAP) tear of left shoulder 02/08/2019   Tobacco abuse     Past Surgical History:  Procedure Laterality Date   APPENDECTOMY N/A 01/01/2015  Procedure: INCIDENTAL APPENDECTOMY;  Surgeon: Jamesetta So, MD;  Location: [facility];  Service: General;  Laterality: N/A;   BACK SURGERY     lower back, fusion   COLONOSCOPY WITH PROPOFOL N/A 12/21/2017   Procedure: COLONOSCOPY WITH PROPOFOL;  Surgeon: Daneil Dolin, MD;  Location: [facility];  Service: Endoscopy;  Laterality: N/A;  9:45am   GASTRECTOMY N/A 04/14/2016   Procedure: GASTRORRHAPHY;  Surgeon: Aviva Signs, MD;  Location: [facility];  Service: General;  Laterality: N/A;   [place]     as child   PARTIAL COLECTOMY N/A 01/01/2015   Procedure: PARTIAL COLECTOMY;   Surgeon: Jamesetta So, MD;  Location: [facility];  Service: General;  Laterality: N/A;   POLYPECTOMY  12/21/2017   Procedure: POLYPECTOMY;  Surgeon: Daneil Dolin, MD;  Location: [facility];  Service: Endoscopy;;  colon   ROTATOR CUFF REPAIR Bilateral    SHOULDER ARTHROSCOPY WITH ROTATOR CUFF REPAIR AND SUBACROMIAL DECOMPRESSION Left 02/09/2019   Procedure: SHOULDER ARTHROSCOPY WITH ROTATOR CUFF REPAIR AND SUBACROMIAL PARTIAL ACROMIOPLASTY, DISTAL CLAVICULECTOMY, AND EXTENSIVE DEBRIDEMENT;  Surgeon: Elsie Saas, MD;  Location: [place];  Service: Orthopedics;  Laterality: Left;     Home Medications:  Prior to Admission medications   Medication Sig Start Date End Date Taking? Authorizing Provider  acetaminophen (TYLENOL) 500 MG tablet Take 500 mg by mouth every 6 (six) hours as needed for moderate pain or headache.   Yes [provider]  amLODipine (NORVASC) 10 MG tablet Take 1 tablet (10 mg total) by mouth daily. 03/21/21  Yes Susy Frizzle, MD  Aspirin-Caffeine [phone] MG PACK Take 1 packet by mouth as needed (headache).   Yes [provider]  aspirin-sod bicarb-citric acid (ALKA-SELTZER) 325 MG TBEF tablet Take 325 mg by mouth every 6 (six) hours as needed (upset stomach).   Yes [provider]  atorvastatin (LIPITOR) 40 MG tablet TAKE 1 TABLET BY MOUTH EVERY DAY Patient taking differently: Take 40 mg by mouth daily. 03/25/18  Yes Susy Frizzle, MD  carvedilol (COREG) 12.5 MG tablet Take 1 tablet (12.5 mg total) by mouth 2 (two) times daily with a meal. 03/21/21  Yes Pickard, Cammie Mcgee, MD  clonazePAM (KLONOPIN) 0.5 MG tablet TAKE 1 TABLET BY MOUTH TWICE A DAY AS NEEDED FOR ANXIETY Patient taking differently: Take 0.5 mg by mouth 2 (two) times daily as needed for anxiety. 07/15/21  Yes Susy Frizzle, MD  cyclobenzaprine (FLEXERIL) 10 MG tablet TAKE 1 TABLET BY MOUTH EVERYDAY AT BEDTIME Patient taking differently: Take 10 mg by mouth at  bedtime. 10/02/20  Yes Susy Frizzle, MD  doxycycline (VIBRA-TABS) 100 MG tablet Take 1 tablet (100 mg total) by mouth 2 (two) times daily. 08/29/21  Yes Susy Frizzle, MD  fluticasone (FLONASE) 50 MCG/ACT nasal spray SPRAY 2 SPRAYS INTO EACH NOSTRIL EVERY DAY Patient taking differently: Place 2 sprays into both nostrils daily. 01/31/21  Yes Susy Frizzle, MD  losartan (COZAAR) 50 MG tablet Take 1 tablet (50 mg total) by mouth daily. 03/21/21  Yes Susy Frizzle, MD  Menthol (BIOFREEZE) 10.5 % AERO Apply 1-2 sprays topically as needed (pain).   Yes [provider]  pantoprazole (PROTONIX) 40 MG tablet Take 1 tablet (40 mg total) by mouth daily. 08/29/21  Yes Susy Frizzle, MD  sildenafil (VIAGRA) 100 MG tablet Take 1 tablet (100 mg total) by mouth daily as needed for erectile dysfunction. 04/01/21  Yes Susy Frizzle, MD  venlafaxine XR (EFFEXOR-XR) 150 MG 24 hr capsule TAKE 1 CAPSULE BY MOUTH EVERY DAY IN THE MORNING Patient taking differently: Take 150 mg by mouth in the morning. 03/11/21  Yes Susy Frizzle, MD    Inpatient Medications: Scheduled Meds:  amLODipine  10 mg Oral Daily   atorvastatin  40 mg Oral Daily   carvedilol  12.5 mg Oral BID WC   cyclobenzaprine  10 mg Oral QHS   enoxaparin (LOVENOX) injection  40 mg Subcutaneous [id number]   folic acid  1 mg Oral Daily   losartan  50 mg Oral Daily   multivitamin with minerals  1 tablet Oral Daily   pantoprazole  40 mg Oral Daily   thiamine  100 mg Oral Daily   Or   thiamine  100 mg Intravenous Daily   venlafaxine XR  150 mg Oral q AM   Continuous Infusions:  PRN Meds: acetaminophen, fluticasone, LORazepam **OR** LORazepam, ondansetron (ZOFRAN) IV  Allergies:   No Known Allergies  Social History:   Social History   Socioeconomic History   Marital status: Married    Spouse name: Not on file   Number of children: Not on file   Years of education: Not on file   Highest education level: Not on file   Occupational History   Not on file  Tobacco Use   Smoking status: Every Day    Packs/day: 1.00    Years: 35.00    Pack years: 35.00    Types: Cigarettes   Smokeless tobacco: Never   Tobacco comments:    1.5 ppd since age 89.   Vaping Use   Vaping Use: Never used  Substance and Sexual Activity   Alcohol use: Yes    Alcohol/week: 0.0 standard drinks    Comment: patient states "about 5 or 6 a day" (beers)   Drug use: No   Sexual activity: Yes  Other Topics Concern   Not on file  Social History Narrative   Not on file   Social Determinants of Health   Financial Resource Strain: Not on file  Food Insecurity: Not on file  Transportation Needs: Not on file  Physical Activity: Not on file  Stress: Not on file  Social Connections: Not on file  Intimate Partner Violence: Not on file    Family History:    Family History  Problem Relation Age of Onset   Diabetes Mother    Stroke Mother    Stroke Father    CAD Father    Colon cancer Neg Hx      ROS:  Please see the history of present illness.   All other ROS reviewed and negative.     Physical Exam/Data:   Vitals:   09/05/21 2320 09/05/21 2354 09/06/21 0547 09/06/21 0839  BP: (!) 148/99 (!) 157/92 (!) 146/84 (!) 137/95  Pulse: 80 78 81 75  Resp:  18 18 18   Temp:  98.4 F (36.9 C) 98.3 F (36.8 C) 97.6 F (36.4 C)  TempSrc:  Oral Oral Oral  SpO2:  97% 96% 98%  Weight:  96.9 kg    Height:  5\' 8"  (1.727 m)     No intake or output data in the 24 hours ending 09/06/21 1131 Last 3 Weights 09/05/2021 09/03/2021 08/29/2021  Weight (lbs) 213 lb 9.6 oz 217 lb 6.4 oz 217 lb  Weight (kg) 96.888 kg 98.612 kg 98.431 kg     Body mass index is 32.48 kg/m.  General:  Well nourished, well developed, in no acute distress HEENT: normal Neck: no JVD Vascular: No carotid bruits; Distal pulses 2+ bilaterally Cardiac:  normal S1, S2; RRR; no murmur  Lungs:  clear to auscultation bilaterally, no wheezing, rhonchi or rales   Abd: soft, nontender, no hepatomegaly  Ext: no edema Musculoskeletal:  No deformities, BUE and BLE strength normal and equal Skin: warm and dry  Neuro:  CNs 2-12 intact, no focal abnormalities noted Psych:  Normal affect   EKG:  The EKG was personally reviewed and demonstrates: Normal sinus rhythm, Q waves in the anterior leads, T wave inversion in the lateral leads. Telemetry:  Telemetry was personally reviewed and demonstrates: Normal sinus rhythm, no significant arrhythmia to explain the recent syncope.  No significant ventricular ectopy.  Relevant CV Studies:  Pending echocardiogram  Laboratory Data:  High Sensitivity Troponin:   Recent Labs  Lab 09/05/21 1240  TROPONINIHS 5     Chemistry Recent Labs  Lab 09/05/21 1240  NA 135  K 4.9  CL 99  CO2 27  GLUCOSE 102*  BUN 11  CREATININE 0.78  CALCIUM 9.5  GFRNONAA >60  ANIONGAP 9    No results for input(s): PROT, ALBUMIN, AST, ALT, ALKPHOS, BILITOT in the last 168 hours. Lipids No results for input(s): CHOL, TRIG, HDL, LABVLDL, LDLCALC, CHOLHDL in the last 168 hours.  Hematology Recent Labs  Lab 09/05/21 1240  WBC 9.1  RBC 5.22  HGB 15.2  HCT 47.2  MCV 90.4  MCH 29.1  MCHC 32.2  RDW 14.8  PLT 256   Thyroid No results for input(s): TSH, FREET4 in the last 168 hours.  BNPNo results for input(s): BNP, PROBNP in the last 168 hours.  DDimer No results for input(s): DDIMER in the last 168 hours.   Radiology/Studies:  DG Chest 2 View  Result Date: 09/05/2021 CLINICAL DATA:  Chest pain and shortness of breath. EXAM: CHEST - 2 VIEW COMPARISON:  September 16, 2016 FINDINGS: The heart size and mediastinal contours are within normal limits. Both lungs are clear. A radiopaque fusion plate and screws are seen overlying the cervical spine. A chronic ninth left rib fracture is noted. IMPRESSION: No active cardiopulmonary disease. Electronically Signed   By: Virgina Norfolk M.D.   On: 09/05/2021 20:31   DG Pelvis 1-2  Views  Result Date: 09/03/2021 Clinical:  SI joint pain, no trauma X-rays were done of the pelvis, AP view There are screws and rods bilateral in L5 and S1 on the AP as well as artificial disc at L5-S1.  SI joints are not distinct.  No fracture is noted.  Hips are well located.  Bone quality is good. Impression:  Post surgery of L5-S1, SI joints not well seen. Electronically Signed Sanjuana Kava, MD 10/11/20222:09 PM   CT Head Wo Contrast  Result Date: 09/05/2021 CLINICAL DATA:  Head trauma, mod-severe head injury EXAM: CT HEAD WITHOUT CONTRAST TECHNIQUE: Contiguous axial images were obtained from the base of the skull through the vertex without intravenous contrast. COMPARISON:  None. FINDINGS: Brain: There is no acute intracranial hemorrhage, mass effect, or edema. Gray-white differentiation is preserved. There is no extra-axial fluid collection. Ventricles and sulci are within normal limits in size and configuration. Vascular: There is atherosclerotic calcification at the skull base. Skull: Calvarium is unremarkable. Sinuses/Orbits: No acute finding. Other: None. IMPRESSION: No evidence of acute intracranial injury. Electronically Signed   By: Macy Mis M.D.   On: 09/05/2021 13:56     Assessment and  Plan:   Exertional chest discomfort: Associated with dyspnea on exertion.  Symptom is quite concerning for stable angina.  Symptom relieved by rest.   - Pending echocardiogram - discussed with MD who reviewed the raw image of the echo, normal EF, will order coronary CT instead.   Recurrent syncope: Had a syncope around 10:30 PM on 09/04/2021 while smoking on the front porch.  He denies any prodromal symptoms such as chest pain, dizziness, or flushing sensation.  He had a similar event roughly about 6 to 8 months ago.  He did not seek medical attention at the time.  Troponin negative x1.  EKG shows Q waves in the anterior leads with T wave inversion in the lateral leads, this is unchanged when  compared to the previous EKG from 2020.  The sudden onset without prodromal symptom is concerning for arrhythmia.  Consider ischemic work-up first, if negative, will need to consider a heart monitor as outpatient.  Need to cut back on alcohol  Hypertension: Continue home amlodipine, carvedilol and losartan  Hyperlipidemia: On Lipitor  Tobacco abuse: Smoke 1.5 pack per day since age 31.  Tobacco cessation strongly recommended.  Alcohol abuse: Drinks about 2 cases (24 of 12 oz beer per case) of beer every week.  Need to cut back as well.    Risk Assessment/Risk Scores:     HEAR Score (for undifferentiated chest pain):  HEAR Score: 4          For questions or updates, please contact [place] Please consult [email or web address] for contact info under    Signed, Almyra Deforest, [place]  09/06/2021 11:31 AM   I have seen and examined the patient along with Almyra Deforest, PA.  I have reviewed the chart, notes and new data.  I agree with PA/NP's note.  Key new complaints: seems to have two CV complaints, that may or may not be related. Has exertional chest pain (but also has chest pain some times at rest, sometimes when bending over). Has had 2 syncopal events 6 months apart, both completely unheralded, at rest, the last with subsequent head injury.  Key examination changes: normal CV exam. Ecchymosis R supraorbital area. Key new findings / data: ECG shows NSR, PRWP, subtle ST depression and T wave inversion in the lateral leads, unchanged from 2008 and 2014 tracings. Echo (my review) is normal except for some criteria for diastolic dysfunction, but there is normal regional and global LV function, normal RV function, no valvular abnormalities. Review of old CT scans does show some scanty atherosclerotic calcifications in the descending aorta, but not in the coronary territory. No arrhythmia on overnight telemetry.  PLAN: Atypical chest pain with low risk echo and biomarkers and chronic ECG abnormalities.  Check coronary CT angio. Recurrent unheralded syncope: if no meaningful CAD is identified, plan implantable loop recorder. We would be very unlikely to identify an arrhythmia with a wearable monitor. Continue atorvastatin (most recent LDL in March was 111, but in years past was 41-60, so suspect some noncompliance w statin). Recommend smoking cessation and marked reduction in alcohol intake. He drinks about 4x more than maximum dose recommended to avoid serious complications.  Sanda Klein, MD, [place] [phone] 09/06/2021, 12:41 PM

## 2021-09-09 ENCOUNTER — Telehealth: Payer: Self-pay

## 2021-09-09 ENCOUNTER — Encounter (HOSPITAL_COMMUNITY): Payer: Self-pay | Admitting: Cardiovascular Disease

## 2021-09-09 NOTE — Telephone Encounter (Signed)
Attempted to contact patient to setup device clinic apt. In 2 weeks No answer, LMTCB.  Monitor is connected and I scheduled remotes.

## 2021-09-09 NOTE — Telephone Encounter (Signed)
Transition Care Management Unsuccessful Follow-up Telephone Call  Date of discharge and from where:  09/06/21 [facility]  Diagnosis: CHEST PAIN, SYNCOPE   Attempts:  1st Attempt  Reason for unsuccessful TCM follow-up call:  Left voice message

## 2021-09-09 NOTE — Telephone Encounter (Signed)
Spoke with patient, patient agreeable to appointment on 09/18/21 at 2:00pm

## 2021-09-09 NOTE — Telephone Encounter (Signed)
-----   Message from Sanda Klein, MD sent at 09/06/2021  4:10 PM EDT ----- Please enroll in device f/u and schedule a remote Linq implant wound check in about 2 weeks. Thanks.

## 2021-09-10 ENCOUNTER — Telehealth: Payer: Self-pay

## 2021-09-10 NOTE — Telephone Encounter (Signed)
Transition Care Management Follow-up Telephone Call Date of discharge and from where: 09/06/21 [facility] Diagnosis: CHEST PAIN, SYNCOPE How have you been since you were released from the hospital? Pt states he is doing well.  Any questions or concerns? No  Items Reviewed: Did the pt receive and understand the discharge instructions provided? Yes  Medications obtained and verified? Yes  Other? No  Any new allergies since your discharge? No  Dietary orders reviewed? Yes Do you have support at home? Yes   Home Care and Equipment/Supplies: Were home health services ordered? not applicable If so, what is the name of the agency? N/a  Has the agency set up a time to come to the patient's home? not applicable Were any new equipment or medical supplies ordered?  No What is the name of the medical supply agency? N/a Were you able to get the supplies/equipment? not applicable Do you have any questions related to the use of the equipment or supplies? No  Functional Questionnaire: (I = Independent and D = Dependent) ADLs: I  Bathing/Dressing- I  Meal Prep- I  Eating- I  Maintaining continence- I  Transferring/Ambulation- I  Managing Meds- I  Follow up appointments reviewed:  PCP Hospital f/u appt confirmed? Yes  Scheduled to see DR. PICKARD  on 09/17/21 @ 2. [place] [facility] f/u appt confirmed? Y Scheduled to see CARDIOLOGY on 09/18/21 @ 2. Are transportation arrangements needed? No  If their condition worsens, is the pt aware to call PCP or go to the Emergency Dept.? Yes Was the patient provided with contact information for the PCP's office or ED? Yes Was to pt encouraged to call back with questions or concerns? Yes

## 2021-09-17 ENCOUNTER — Other Ambulatory Visit: Payer: Self-pay

## 2021-09-17 ENCOUNTER — Ambulatory Visit: Payer: BC Managed Care – PPO | Admitting: Family Medicine

## 2021-09-17 ENCOUNTER — Encounter: Payer: Self-pay | Admitting: Family Medicine

## 2021-09-17 VITALS — BP 144/90 | HR 78 | Temp 98.2°F | Wt 222.2 lb

## 2021-09-17 DIAGNOSIS — R55 Syncope and collapse: Secondary | ICD-10-CM

## 2021-09-17 NOTE — Progress Notes (Signed)
Subjective:    Patient ID: [name], male    DOB: 1966/05/07, 55 y.o.   MRN: [id number]  Admit date: 09/05/2021 Discharge date: 09/06/2021   Time spent: 35 minutes.   Recommendations for Outpatient Follow-up:  Follow-up with cardiology Arrangements made for follow-up in 6 to 8 weeks by cardiology to device clinic. Follow-up with your PCP within a week Do not drive or operate heavy machinery until cleared by cardiology. Take your medications as prescribed Abstain from alcohol abuse.   Discharge Diagnoses:      Active Hospital Problems    Diagnosis Date Noted  . Chest pain, rule out acute myocardial infarction 09/05/2021  . Syncope 09/05/2021  . ETOH abuse 12/26/2014  . Essential hypertension 05/25/2009     Resolved Hospital Problems  No resolved problems to display.      Discharge Condition: Stable   Diet recommendation: Resume previous diet.       Vitals:    09/06/21 1606 09/06/21 1700  BP: 119/82    Pulse: 69    Resp: 18    Temp:      SpO2:   95%      History of present illness:   [name] is a 55 y.o. male with medical history significant of HTN, HLD, smoking.  Pt presents to ED with c/o intermittent exertional chest pain over the last few weeks.  Endorses 1 episode of syncope the evening prior to presentation.  No prodrome.  Seen by cardiology.  Status post implantable loop recorder placement on 09/06/2021 by cardiology Dr. Sallyanne Kuster due to concern for possible arrhythmia as a cause of patient's syncope.   09/06/21:  Seen and examined at bedside.  He denies chest pain.  No dyspnea.  No evidence of acute alcohol withdrawal at time of visit.   Hospital Course:  Principal Problem:   Chest pain, rule out acute myocardial infarction Active Problems:   Essential hypertension   ETOH abuse   Syncope   Resolved chest pain rule out ACS Heart score 5 No evidence of acute ischemia on twelve-lead EKG Troponin negative 2D echo done on 09/06/2021  showed normal LVEF 55 to 60%, no regional wall motion abnormalities.  Grade 1 diastolic dysfunction. Seen by cardiology, okay to discharge from a cardiology standpoint. Denies any anginal symptoms at the time of this visit. Coronary CT angio 09/06/2021.  Findings as stated below. Continue aspirin 81 mg daily, Lipitor 40 mg daily. Follow-up with cardiology.  Syncope, rule out arrhythmia, status post implantable loop recorder placement on 09/06/2021 by Dr. Sallyanne Kuster No prodromes 2D echo findings as stated above. Follow-up appointment has been arranged by cardiology. Do not drive or operate heavy machinery until cleared by cardiology.   Essential hypertension BP is currently at goal. Continue home Norvasc 10 mg daily, Coreg 12.5 mg twice daily, losartan 50 mg daily. Follow-up with your PCP within a week  Chronic anxiety/depression Continue home Effexor  GERD Continue home PPI  Hyperlipidemia Continue home Lipitor.  Alcohol abuse No evidence of acute alcohol withdrawal at the time of this visit Continue multivitamins, thiamine and folic acid supplementation Recommend excessive alcohol use cessation. TOC consulted to provide resources for alcohol abuse.   Tobacco use disorder TOC consulted to provide resources for tobacco cessation     Coronary CT angiogram shows some nonobstructive coronary disease, but nothing that could explain syncope or exertional chest pain.  Treat with aspirin, lipid-lowering therapy, smoking cessation.   Loop recorder implanted, no immediate complications.  With  the device clinic follow-up and wound check appointment in 2 weeks.   Okay to discharge from cardiology point of view.            Patient is here today for hospital follow-up.  He states that he was sitting on his back porch around 10:00 in the evening when he suddenly lost consciousness.  There was no warning.  He admits that he had drank about a sixpack of beer and was smoking a cigarette  but he denies any other drug use.  He has not taken any other pain medication or muscle relaxers or anxiety medicine.  He denies any bowel or bladder incontinence.  He denies biting his tongue.  He did fall out of his chair and suffered a laceration and abrasion to his forehead and busted his nose.  He went to the hospital where they implanted a loop recorder to rule out cardiac arrhythmias.  Head CT was normal.  Coronary artery CT scan showed noncritical obstructions in the right coronary artery and left anterior descending coronary artery.  He has a follow-up appointment tomorrow with cardiology to discuss the loop recorder.  He has not had any further syncopal episodes.  He denies any previous history of seizure disorder. Past Medical History:  Diagnosis Date  . Allergy   . Anemia   . Colonic diverticular abscess 12/26/2014  . Diverticulitis   . ETOH abuse   . GAD (generalized anxiety disorder)   . GERD (gastroesophageal reflux disease)   . Hypercholesteremia   . Hypertension   . Left rotator cuff tear 02/08/2019  . Superior labrum anterior-to-posterior (SLAP) tear of left shoulder 02/08/2019  . Tobacco abuse    Past Surgical History:  Procedure Laterality Date  . APPENDECTOMY N/A 01/01/2015   Procedure: INCIDENTAL APPENDECTOMY;  Surgeon: Jamesetta So, MD;  Location: [facility];  Service: General;  Laterality: N/A;  . BACK SURGERY     lower back, fusion  . COLONOSCOPY WITH PROPOFOL N/A 12/21/2017   Procedure: COLONOSCOPY WITH PROPOFOL;  Surgeon: Daneil Dolin, MD;  [facility];  Service: Endoscopy;  Laterality: N/A;  9:45am  . GASTRECTOMY N/A 04/14/2016   Procedure: Toniann Ket;  Surgeon: Aviva Signs, MD;  Location: [facility];  Service: General;  Laterality: N/A;  . HEMORRHOID SURGERY    . HERNIA REPAIR     as child  . LOOP RECORDER INSERTION N/A 09/06/2021   Procedure: LOOP RECORDER INSERTION;  Surgeon: Sanda Klein, MD;  Location: [place] [facility];  Service:  Cardiovascular;  Laterality: N/A;  . PARTIAL COLECTOMY N/A 01/01/2015   Procedure: PARTIAL COLECTOMY;  Surgeon: Jamesetta So, MD;  Location: [facility];  Service: General;  Laterality: N/A;  . POLYPECTOMY  12/21/2017   Procedure: POLYPECTOMY;  Surgeon: Daneil Dolin, MD;  [facility];  Service: Endoscopy;;  colon  . ROTATOR CUFF REPAIR Bilateral   . SHOULDER ARTHROSCOPY WITH ROTATOR CUFF REPAIR AND SUBACROMIAL DECOMPRESSION Left 02/09/2019   Procedure: SHOULDER ARTHROSCOPY WITH ROTATOR CUFF REPAIR AND SUBACROMIAL PARTIAL ACROMIOPLASTY, DISTAL CLAVICULECTOMY, AND EXTENSIVE DEBRIDEMENT;  Surgeon: Elsie Saas, MD;  Location: [place];  Service: Orthopedics;  Laterality: Left;   Current Outpatient Medications on File Prior to Visit  Medication Sig Dispense Refill  . acetaminophen (TYLENOL) 500 MG tablet Take 500 mg by mouth every 6 (six) hours as needed for moderate pain or headache.    Marland Kitchen amLODipine (NORVASC) 10 MG tablet Take 1 tablet (10 mg total) by mouth daily. [place]  tablet 3  . atorvastatin (LIPITOR) 40 MG tablet TAKE 1 TABLET BY MOUTH EVERY DAY (Patient taking differently: Take 40 mg by mouth daily.) 90 tablet 1  . carvedilol (COREG) 12.5 MG tablet Take 1 tablet (12.5 mg total) by mouth 2 (two) times daily with a meal. 180 tablet 3  . clonazePAM (KLONOPIN) 0.5 MG tablet TAKE 1 TABLET BY MOUTH TWICE A DAY AS NEEDED FOR ANXIETY (Patient taking differently: Take 0.5 mg by mouth 2 (two) times daily as needed for anxiety.) 30 tablet 2  . cyclobenzaprine (FLEXERIL) 10 MG tablet TAKE 1 TABLET BY MOUTH EVERYDAY AT BEDTIME (Patient taking differently: Take 10 mg by mouth at bedtime.) 30 tablet 2  . doxycycline (VIBRA-TABS) 100 MG tablet Take 1 tablet (100 mg total) by mouth 2 (two) times daily. 28 tablet 0  . fluticasone (FLONASE) 50 MCG/ACT nasal spray SPRAY 2 SPRAYS INTO EACH NOSTRIL EVERY DAY (Patient taking differently: Place 2 sprays into both nostrils daily.) 48 g 3  .  folic acid (FOLVITE) 1 MG tablet Take 1 tablet (1 mg total) by mouth daily. 90 tablet 0  . losartan (COZAAR) 50 MG tablet Take 1 tablet (50 mg total) by mouth daily. 90 tablet 3  . Menthol (BIOFREEZE) 10.5 % AERO Apply 1-2 sprays topically as needed (pain).    . Multiple Vitamin (MULTIVITAMIN WITH MINERALS) TABS tablet Take 1 tablet by mouth daily. 90 tablet 0  . pantoprazole (PROTONIX) 40 MG tablet Take 1 tablet (40 mg total) by mouth daily. 30 tablet 3  . sildenafil (VIAGRA) 100 MG tablet Take 1 tablet (100 mg total) by mouth daily as needed for erectile dysfunction. 20 tablet 11  . thiamine 100 MG tablet Take 1 tablet (100 mg total) by mouth daily. 90 tablet 0  . venlafaxine XR (EFFEXOR-XR) 150 MG 24 hr capsule TAKE 1 CAPSULE BY MOUTH EVERY DAY IN THE MORNING (Patient taking differently: Take 150 mg by mouth in the morning.) 90 capsule 3  . aspirin EC 81 MG tablet Take 1 tablet (81 mg total) by mouth daily. Swallow whole. (Patient not taking: Reported on 09/17/2021) 90 tablet 0   No current facility-administered medications on file prior to visit.   No Known Allergies Social History   Socioeconomic History  . Marital status: Married    Spouse name: Not on file  . Number of children: Not on file  . Years of education: Not on file  . Highest education level: Not on file  Occupational History  . Not on file  Tobacco Use  . Smoking status: Every Day    Packs/day: 1.00    Years: 35.00    Pack years: 35.00    Types: Cigarettes  . Smokeless tobacco: Never  . Tobacco comments:    1.5 ppd since age 49.   Vaping Use  . Vaping Use: Never used  Substance and Sexual Activity  . Alcohol use: Yes    Alcohol/week: 0.0 standard drinks    Comment: patient states "about 5 or 6 a day" (beers)  . Drug use: No  . Sexual activity: Yes  Other Topics Concern  . Not on file  Social History Narrative  . Not on file   Social Determinants of Health   Financial Resource Strain: Not on file  Food  Insecurity: Not on file  Transportation Needs: Not on file  Physical Activity: Not on file  Stress: Not on file  Social Connections: Not on file  Intimate Partner Violence: Not on file  Review of Systems  All other systems reviewed and are negative.     Objective:   Physical Exam Vitals reviewed.  Constitutional:      Appearance: Normal appearance. He is well-developed. He is obese.  Eyes:     Conjunctiva/sclera: Conjunctivae normal.     Pupils: Pupils are equal, round, and reactive to light.  Cardiovascular:     Rate and Rhythm: Normal rate and regular rhythm.     Pulses: Normal pulses.     Heart sounds: Normal heart sounds. No murmur heard.   No friction rub. No gallop.  Pulmonary:     Effort: Pulmonary effort is normal. No respiratory distress.     Breath sounds: Normal breath sounds. No wheezing or rales.  Abdominal:     General: Bowel sounds are normal. There is no distension.     Palpations: Abdomen is soft.     Tenderness: There is no abdominal tenderness. There is no guarding or rebound.  Musculoskeletal:     Cervical back: Neck supple.     Lumbar back: Tenderness present. No spasms. Decreased range of motion.  Lymphadenopathy:     Cervical: No cervical adenopathy.  Neurological:     General: No focal deficit present.     Mental Status: He is alert and oriented to person, place, and time. Mental status is at baseline.     Cranial Nerves: No cranial nerve deficit.     Sensory: No sensory deficit.     Motor: No weakness or abnormal muscle tone.     Coordination: Coordination normal.     Gait: Gait normal.     Deep Tendon Reflexes: Reflexes are normal and symmetric.          Assessment & Plan:   Syncope, unspecified syncope type Differential diagnosis for syncope includes cardiac arrhythmia, vasovagal syncope, seizure, or perhaps intoxication.  I discussed these all with the patient.  Cardiac arrhythmia is being ruled out using a loop recorder and he  is following up with cardiology.  Given the coronary artery disease seen on the coronary artery CT scan, I have strongly emphasized smoking cessation.  He is already on Lipitor and an aspirin and a beta-blocker.  I have recommended no driving until cleared by cardiology.  I would also like to set the patient up to see neurology to see if they feel he would benefit from an EEG however I feel that this was unlikely to be seizure disorder.  I have also recommended abstinence from alcohol

## 2021-09-18 ENCOUNTER — Ambulatory Visit (INDEPENDENT_AMBULATORY_CARE_PROVIDER_SITE_OTHER): Payer: BC Managed Care – PPO

## 2021-09-18 DIAGNOSIS — R55 Syncope and collapse: Secondary | ICD-10-CM

## 2021-09-18 NOTE — Progress Notes (Signed)
ILR wound check in clinic. Steri strips removed. Wound well healed. Home monitor transmitting nightly. 2 symptoms events logged, EGM's appear SR. Questions answered.

## 2021-09-18 NOTE — Patient Instructions (Signed)
Call if you have any drainage, redness, swelling, bleeding, fever or chills. Wash incision area daily when you shower and keep clean, dry and open to air. Your monitor will transmit nightly. Make sure your phone is kept "ON" and beside where you sleep within 4-6 feet. Call the device clinic if you have further questions or concerns.  [facility] [phone]

## 2021-09-23 DIAGNOSIS — M461 Sacroiliitis, not elsewhere classified: Secondary | ICD-10-CM | POA: Diagnosis not present

## 2021-09-23 DIAGNOSIS — M545 Low back pain, unspecified: Secondary | ICD-10-CM | POA: Diagnosis not present

## 2021-09-30 DIAGNOSIS — M461 Sacroiliitis, not elsewhere classified: Secondary | ICD-10-CM | POA: Diagnosis not present

## 2021-10-09 ENCOUNTER — Ambulatory Visit (INDEPENDENT_AMBULATORY_CARE_PROVIDER_SITE_OTHER): Payer: BC Managed Care – PPO

## 2021-10-09 DIAGNOSIS — R55 Syncope and collapse: Secondary | ICD-10-CM

## 2021-10-10 LAB — CUP PACEART REMOTE DEVICE CHECK
Date Time Interrogation Session: 20221116170149
Implantable Pulse Generator Implant Date: 20221014

## 2021-10-16 NOTE — Progress Notes (Signed)
[facility] Summary Report / Loop Recorder 

## 2021-10-19 ENCOUNTER — Other Ambulatory Visit: Payer: Self-pay | Admitting: Family Medicine

## 2021-10-20 ENCOUNTER — Other Ambulatory Visit: Payer: Self-pay | Admitting: Family Medicine

## 2021-10-21 ENCOUNTER — Other Ambulatory Visit: Payer: Self-pay | Admitting: Family Medicine

## 2021-10-29 DIAGNOSIS — M461 Sacroiliitis, not elsewhere classified: Secondary | ICD-10-CM | POA: Diagnosis not present

## 2021-11-11 ENCOUNTER — Ambulatory Visit (INDEPENDENT_AMBULATORY_CARE_PROVIDER_SITE_OTHER): Payer: BC Managed Care – PPO

## 2021-11-11 DIAGNOSIS — R55 Syncope and collapse: Secondary | ICD-10-CM | POA: Diagnosis not present

## 2021-11-12 LAB — CUP PACEART REMOTE DEVICE CHECK
Date Time Interrogation Session: 20221219170148
Implantable Pulse Generator Implant Date: 20221014

## 2021-11-20 NOTE — Progress Notes (Signed)
[facility] Summary Report / Loop Recorder 

## 2021-12-11 ENCOUNTER — Institutional Professional Consult (permissible substitution): Payer: BC Managed Care – PPO | Admitting: Neurology

## 2021-12-16 ENCOUNTER — Ambulatory Visit (INDEPENDENT_AMBULATORY_CARE_PROVIDER_SITE_OTHER): Payer: BC Managed Care – PPO

## 2021-12-16 DIAGNOSIS — R55 Syncope and collapse: Secondary | ICD-10-CM | POA: Diagnosis not present

## 2021-12-16 LAB — CUP PACEART REMOTE DEVICE CHECK
Date Time Interrogation Session: 20230122230441
Implantable Pulse Generator Implant Date: 20221014

## 2021-12-26 NOTE — Progress Notes (Signed)
[facility] Summary Report / Loop Recorder 

## 2022-01-11 ENCOUNTER — Other Ambulatory Visit: Payer: Self-pay | Admitting: Family Medicine

## 2022-01-13 ENCOUNTER — Other Ambulatory Visit: Payer: Self-pay | Admitting: Family Medicine

## 2022-01-14 NOTE — Telephone Encounter (Signed)
LOV 09/17/21 Last refill 10/21/21, #30, 2 refills  Please review, thanks!

## 2022-01-20 ENCOUNTER — Ambulatory Visit (INDEPENDENT_AMBULATORY_CARE_PROVIDER_SITE_OTHER): Payer: BC Managed Care – PPO

## 2022-01-20 DIAGNOSIS — R55 Syncope and collapse: Secondary | ICD-10-CM | POA: Diagnosis not present

## 2022-01-20 LAB — CUP PACEART REMOTE DEVICE CHECK
Date Time Interrogation Session: 20230226230820
Implantable Pulse Generator Implant Date: 20221014

## 2022-01-22 NOTE — Progress Notes (Signed)
[facility] Summary Report / Loop Recorder 

## 2022-02-08 ENCOUNTER — Other Ambulatory Visit: Payer: Self-pay | Admitting: Family Medicine

## 2022-02-10 ENCOUNTER — Other Ambulatory Visit: Payer: Self-pay

## 2022-02-10 NOTE — Telephone Encounter (Signed)
LOV 09/17/21 ?Last refill 01/16/22, #30, 0 refills ? ?Please review, thanks! ? ?

## 2022-02-24 ENCOUNTER — Ambulatory Visit (INDEPENDENT_AMBULATORY_CARE_PROVIDER_SITE_OTHER): Payer: Self-pay

## 2022-02-24 DIAGNOSIS — R55 Syncope and collapse: Secondary | ICD-10-CM

## 2022-02-24 LAB — CUP PACEART REMOTE DEVICE CHECK
Date Time Interrogation Session: 20230331230802
Implantable Pulse Generator Implant Date: 20221014

## 2022-02-27 ENCOUNTER — Telehealth: Payer: Self-pay | Admitting: Cardiovascular Disease

## 2022-02-27 NOTE — Telephone Encounter (Signed)
Pt is stating that he wants to turn his device and would like for someone to give him a call. Please advise ?

## 2022-02-27 NOTE — Telephone Encounter (Signed)
Successful telephone encounter to patient to follow up on his request to discontinue loop recorder monitoring. Patient states he is billed 88.00/month and that is not affordable at this time. He is aware that once monitoring is discontinued there is no way to assess heart rhythm or potential causes of syncopal episodes. Patient states he is well aware. All future remote appointments discontinued. Will advise Dr. Sallyanne Kuster of patients decision.  ?

## 2022-03-01 NOTE — Telephone Encounter (Signed)
Thanks, Portia ?I would still advise checking his device every 6 months in clinic, at least. Lattie Haw, if he agrees, please make him an appt in October. Thanks. ?

## 2022-03-03 ENCOUNTER — Other Ambulatory Visit: Payer: Self-pay | Admitting: Family Medicine

## 2022-03-03 DIAGNOSIS — I10 Essential (primary) hypertension: Secondary | ICD-10-CM

## 2022-03-03 DIAGNOSIS — R000 Tachycardia, unspecified: Secondary | ICD-10-CM

## 2022-03-03 NOTE — Telephone Encounter (Signed)
Left a message for the patient to call. ? ?Recall placed for October.  ?

## 2022-03-04 NOTE — Telephone Encounter (Signed)
LOV 09/17/21 ?Last refill 02/11/22, #30, 0 refills ? ?Please review, thanks! ? ?

## 2022-03-06 NOTE — Progress Notes (Signed)
[facility] Summary Report / Loop Recorder 

## 2022-03-07 ENCOUNTER — Ambulatory Visit (INDEPENDENT_AMBULATORY_CARE_PROVIDER_SITE_OTHER): Payer: PPO | Admitting: Family Medicine

## 2022-03-07 VITALS — BP 142/98 | HR 89 | Temp 97.0°F | Ht 68.0 in | Wt 226.4 lb

## 2022-03-07 DIAGNOSIS — B351 Tinea unguium: Secondary | ICD-10-CM | POA: Diagnosis not present

## 2022-03-07 MED ORDER — ITRACONAZOLE 100 MG PO CAPS: 200.0000 mg | ORAL_CAPSULE | Freq: Every day | ORAL | 1 refills | Status: DC

## 2022-03-07 NOTE — Progress Notes (Signed)
? ?Subjective:  ? ? Patient ID: [name], male    DOB: Feb 22, 1966, 56 y.o.   MRN: [id number] ? ?I have not seen the patient since he was admitted to the hospital with a syncopal episode in October.  Cardiology placed a loop recorder and was monitoring for any cardiac arrhythmias.  I reviewed the patient's chart and he recently requested that they discontinue the monitoring service due to the cost of $88 per month. ? ?The photograph above demonstrates his right middle finger.  He has been on Lamisil for approximately 6 weeks and has seen no improvement for onychomycosis.  There now appears to be a dark object underneath the nail adjacent to the proximal nail fold.  He denies any trauma or he may have hit his finger with a hard object.  It does not appear to be a subungual hematoma.  He denies any pain. ?Past Medical History:  ?Diagnosis Date  ? Allergy   ? Anemia   ? Colonic diverticular abscess 12/26/2014  ? Diverticulitis   ? ETOH abuse   ? GAD (generalized anxiety disorder)   ? GERD (gastroesophageal reflux disease)   ? Hypercholesteremia   ? Hypertension   ? Left rotator cuff tear 02/08/2019  ? Superior labrum anterior-to-posterior (SLAP) tear of left shoulder 02/08/2019  ? Tobacco abuse   ? ?Past Surgical History:  ?Procedure Laterality Date  ? APPENDECTOMY N/A 01/01/2015  ? Procedure: INCIDENTAL APPENDECTOMY;  Surgeon: Jamesetta So, MD;  Location[facility];  Service: General;  Laterality: N/A;  ? BACK SURGERY    ? lower back, fusion  ? COLONOSCOPY WITH PROPOFOL N/A 12/21/2017  ? Procedure: COLONOSCOPY WITH PROPOFOL;  Surgeon: Daneil Dolin, MD;  [facility];  Service: Endoscopy;  Laterality: N/A;  9:45am  ? GASTRECTOMY N/A 04/14/2016  ? Procedure: GASTRORRHAPHY;  Surgeon: Aviva Signs, MD;  Location: [facility];  Service: General;  Laterality: N/A;  ? HEMORRHOID SURGERY    ? HERNIA REPAIR    ? as child  ? LOOP RECORDER INSERTION N/A 09/06/2021  ? Procedure: LOOP RECORDER INSERTION;  Surgeon:  Sanda Klein, MD;  Location: [place] [facility];  Service: Cardiovascular;  Laterality: N/A;  ? PARTIAL COLECTOMY N/A 01/01/2015  ? Procedure: PARTIAL COLECTOMY;  Surgeon: Jamesetta So, MD;  [facility];  Service: General;  Laterality: N/A;  ? POLYPECTOMY  12/21/2017  ? Procedure: POLYPECTOMY;  Surgeon: Daneil Dolin, MD;  [facility];  Service: Endoscopy;;  colon  ? ROTATOR CUFF REPAIR Bilateral   ? SHOULDER ARTHROSCOPY WITH ROTATOR CUFF REPAIR AND SUBACROMIAL DECOMPRESSION Left 02/09/2019  ? Procedure: SHOULDER ARTHROSCOPY WITH ROTATOR CUFF REPAIR AND SUBACROMIAL PARTIAL ACROMIOPLASTY, DISTAL CLAVICULECTOMY, AND EXTENSIVE DEBRIDEMENT;  Surgeon: Elsie Saas, MD;  Location: [place];  Service: Orthopedics;  Laterality: Left;  ? ?Current Outpatient Medications on File Prior to Visit  ?Medication Sig Dispense Refill  ? amLODipine (NORVASC) 10 MG tablet TAKE 1 TABLET BY MOUTH EVERY DAY 90 tablet 3  ? atorvastatin (LIPITOR) 40 MG tablet TAKE 1 TABLET BY MOUTH EVERY DAY (Patient taking differently: Take 40 mg by mouth daily.) 90 tablet 1  ? carvedilol (COREG) 12.5 MG tablet TAKE 1 TABLET (12.5 MG TOTAL) BY MOUTH 2 (TWO) TIMES DAILY WITH A MEAL. 180 tablet 3  ? clonazePAM (KLONOPIN) 0.5 MG tablet TAKE 1 TABLET BY MOUTH TWICE A DAY AS NEEDED FOR ANXIETY 30 tablet 0  ? cyclobenzaprine (FLEXERIL) 10 MG tablet TAKE 1 TABLET BY  MOUTH EVERYDAY AT BEDTIME (Patient taking differently: Take 10 mg by mouth at bedtime.) 30 tablet 2  ? fluticasone (FLONASE) 50 MCG/ACT nasal spray SPRAY 2 SPRAYS INTO EACH NOSTRIL EVERY DAY 48 mL 3  ? losartan (COZAAR) 50 MG tablet TAKE 1 TABLET BY MOUTH EVERY DAY 90 tablet 3  ? Menthol (BIOFREEZE) 10.5 % AERO Apply 1-2 sprays topically as needed (pain).    ? pantoprazole (PROTONIX) 40 MG tablet TAKE 1 TABLET BY MOUTH EVERY DAY 90 tablet 1  ? sildenafil (VIAGRA) 100 MG tablet Take 1 tablet (100 mg total) by mouth daily as needed for erectile dysfunction. 20  tablet 11  ? terbinafine (LAMISIL) 250 MG tablet Take 250 mg by mouth daily.    ? venlafaxine XR (EFFEXOR-XR) 150 MG 24 hr capsule TAKE 1 CAPSULE BY MOUTH EVERY DAY IN THE MORNING 90 capsule 3  ? ?No current facility-administered medications on file prior to visit.  ? ?No Known Allergies ?Social History  ? ?Socioeconomic History  ? Marital status: Married  ?  Spouse name: Not on file  ? Number of children: Not on file  ? Years of education: Not on file  ? Highest education level: Not on file  ?Occupational History  ? Not on file  ?Tobacco Use  ? Smoking status: Every Day  ?  Packs/day: 1.00  ?  Years: 35.00  ?  Pack years: 35.00  ?  Types: Cigarettes  ? Smokeless tobacco: Never  ? Tobacco comments:  ?  1.5 ppd since age 39.   ?Vaping Use  ? Vaping Use: Never used  ?Substance and Sexual Activity  ? Alcohol use: Yes  ?  Alcohol/week: 0.0 standard drinks  ?  Comment: patient states "about 5 or 6 a day" (beers)  ? Drug use: No  ? Sexual activity: Yes  ?Other Topics Concern  ? Not on file  ?Social History Narrative  ? Not on file  ? ?Social Determinants of Health  ? ?Financial Resource Strain: Not on file  ?Food Insecurity: Not on file  ?Transportation Needs: Not on file  ?Physical Activity: Not on file  ?Stress: Not on file  ?Social Connections: Not on file  ?Intimate Partner Violence: Not on file  ? ? ? ? ?Review of Systems  ?All other systems reviewed and are negative. ? ?   ?Objective:  ? Physical Exam ?Vitals reviewed.  ?Constitutional:   ?   Appearance: Normal appearance. He is well-developed. He is obese.  ?Eyes:  ?   Conjunctiva/sclera: Conjunctivae normal.  ?   Pupils: Pupils are equal, round, and reactive to light.  ?Cardiovascular:  ?   Rate and Rhythm: Normal rate and regular rhythm.  ?   Pulses: Normal pulses.  ?   Heart sounds: Normal heart sounds. No murmur heard. ?  No friction rub. No gallop.  ?Pulmonary:  ?   Effort: Pulmonary effort is normal. No respiratory distress.  ?   Breath sounds: Normal breath  sounds. No wheezing or rales.  ?Abdominal:  ?   General: Bowel sounds are normal. There is no distension.  ?   Palpations: Abdomen is soft.  ?   Tenderness: There is no abdominal tenderness. There is no guarding or rebound.  ?Musculoskeletal:  ?   Cervical back: Neck supple.  ?   Lumbar back: Tenderness present. No spasms. Decreased range of motion.  ?Lymphadenopathy:  ?   Cervical: No cervical adenopathy.  ?Neurological:  ?   General: No focal deficit present.  ?  Mental Status: He is alert and oriented to person, place, and time. Mental status is at baseline.  ?   Cranial Nerves: No cranial nerve deficit.  ?   Sensory: No sensory deficit.  ?   Motor: No weakness or abnormal muscle tone.  ?   Coordination: Coordination normal.  ?   Gait: Gait normal.  ?   Deep Tendon Reflexes: Reflexes are normal and symmetric.  ? ? ? ? ? ?   ?Assessment & Plan:  ? ?Onychomycosis ?Patient has a dysmorphic left third fingernail.  Differential diagnosis includes onychomycosis versus some type of mass causing the nail to grow abnormally.  We discussed removing the nail and sending it for a biopsy given the fact he has tried and failed 6 weeks of Lamisil.  We discussed switching to itraconazole 200 mg daily for 6 weeks.  He prefers to do this.  If persistent at that point I would recommend performing a digital block and removing the nail and sending any underlying mass for biopsy to rule out malignancy.  Patient agrees. ?

## 2022-03-31 ENCOUNTER — Other Ambulatory Visit: Payer: Self-pay | Admitting: Family Medicine

## 2022-04-01 NOTE — Telephone Encounter (Signed)
Requested medication (s) are due for refill today - yes ? ?Requested medication (s) are on the active medication list -yes ? ?Future visit scheduled no ? ?Last refill: 03/04/22 #30 ? ?Notes to clinic: non delegated Rx ? ?Requested Prescriptions  ?Pending Prescriptions Disp Refills  ? clonazePAM (KLONOPIN) 0.5 MG tablet [Pharmacy Med Name: CLONAZEPAM 0.5 MG TABLET] 30 tablet 0  ?  Sig: TAKE 1 TABLET BY MOUTH TWICE A DAY AS NEEDED FOR ANXIETY  ?  ? Not Delegated - Psychiatry: Anxiolytics/Hypnotics 2 Failed - 03/31/2022  9:10 PM  ?  ?  Failed - This refill cannot be delegated  ?  ?  Failed - Urine Drug Screen completed in last 360 days  ?  ?  Passed - Patient is not pregnant  ?  ?  Passed - Valid encounter within last 6 months  ?  Recent Outpatient Visits   ? ?      ? 3 weeks ago Onychomycosis  ? [facility], Cammie Mcgee, MD  ? 6 months ago Syncope, unspecified syncope type  ? [facility], Cammie Mcgee, MD  ? 7 months ago Chest pain, unspecified type  ? [facility], Cammie Mcgee, MD  ? 10 months ago Flank pain  ? [facility], Cammie Mcgee, MD  ? 1 year ago Benign essential HTN  ? [facility], Cammie Mcgee, MD  ? ?  ?  ? ? ?  ?  ?  ? ? ? ?Requested Prescriptions  ?Pending Prescriptions Disp Refills  ? clonazePAM (KLONOPIN) 0.5 MG tablet [Pharmacy Med Name: CLONAZEPAM 0.5 MG TABLET] 30 tablet 0  ?  Sig: TAKE 1 TABLET BY MOUTH TWICE A DAY AS NEEDED FOR ANXIETY  ?  ? Not Delegated - Psychiatry: Anxiolytics/Hypnotics 2 Failed - 03/31/2022  9:10 PM  ?  ?  Failed - This refill cannot be delegated  ?  ?  Failed - Urine Drug Screen completed in last 360 days  ?  ?  Passed - Patient is not pregnant  ?  ?  Passed - Valid encounter within last 6 months  ?  Recent Outpatient Visits   ? ?      ? 3 weeks ago Onychomycosis  ? [facility], Cammie Mcgee, MD  ? 6 months ago Syncope, unspecified syncope type  ? [facility], Cammie Mcgee, MD  ? 7 months ago Chest pain, unspecified type  ? [facility], Cammie Mcgee, MD  ? 10 months ago Flank pain  ? [facility], Cammie Mcgee, MD  ? 1 year ago Benign essential HTN  ? [facility], Cammie Mcgee, MD  ? ?  ?  ? ? ?  ?  ?  ? ? ? ?

## 2022-04-02 NOTE — Telephone Encounter (Signed)
LOV 03/07/22 ?Last refill 03/04/22, #30, 0 refills ? ?Please review, thanks! ? ?

## 2022-04-08 ENCOUNTER — Other Ambulatory Visit: Payer: Self-pay | Admitting: Family Medicine

## 2022-04-09 NOTE — Telephone Encounter (Signed)
Requested medication (s) are due for refill today - expired Rx ? ?Requested medication (s) are on the active medication list -yes ? ?Future visit scheduled -no ? ?Last refill: 04/01/21 #20 11RF ? ?Notes to clinic: expired Rx, fails lab protocol-2022, failed BP ? ?Requested Prescriptions  ?Pending Prescriptions Disp Refills  ? sildenafil (VIAGRA) 100 MG tablet [Pharmacy Med Name: SILDENAFIL 100 MG TABLET] 4 tablet 59  ?  Sig: TAKE 1 TABLET BY MOUTH EVERY DAY AS NEEDED FOR ERECTILE DYSFUNCTION  ?  ? Urology: Erectile Dysfunction Agents Failed - 04/08/2022  2:06 AM  ?  ?  Failed - AST in normal range and within 360 days  ?  AST  ?Date Value Ref Range Status  ?01/31/2021 47 (H) 10 - 35 U/L Final  ?   ?  ?  Failed - ALT in normal range and within 360 days  ?  ALT  ?Date Value Ref Range Status  ?01/31/2021 68 (H) 9 - 46 U/L Final  ?   ?  ?  Failed - Last BP in normal range  ?  BP Readings from Last 1 Encounters:  ?03/07/22 (!) 142/98  ?   ?  ?  Passed - Valid encounter within last 12 months  ?  Recent Outpatient Visits   ? ?      ? 1 month ago Onychomycosis  ? [facility] [place], Cammie Mcgee, MD  ? 6 months ago Syncope, unspecified syncope type  ? [facility] [place], Cammie Mcgee, MD  ? 7 months ago Chest pain, unspecified type  ? [facility] [place], Cammie Mcgee, MD  ? 11 months ago Flank pain  ? [facility] [place], Cammie Mcgee, MD  ? 1 year ago Benign essential HTN  ? [facility] [place], Cammie Mcgee, MD  ? ?  ?  ? ? ?  ?  ?  ? ? ? ?Requested Prescriptions  ?Pending Prescriptions Disp Refills  ? sildenafil (VIAGRA) 100 MG tablet [Pharmacy Med Name: SILDENAFIL 100 MG TABLET] 4 tablet 59  ?  Sig: TAKE 1 TABLET BY MOUTH EVERY DAY AS NEEDED FOR ERECTILE DYSFUNCTION  ?  ? Urology: Erectile Dysfunction Agents Failed - 04/08/2022  2:06 AM  ?  ?  Failed - AST in normal range and within 360 days  ?  AST  ?Date Value Ref Range Status  ?01/31/2021 47 (H) 10 - 35  U/L Final  ?   ?  ?  Failed - ALT in normal range and within 360 days  ?  ALT  ?Date Value Ref Range Status  ?01/31/2021 68 (H) 9 - 46 U/L Final  ?   ?  ?  Failed - Last BP in normal range  ?  BP Readings from Last 1 Encounters:  ?03/07/22 (!) 142/98  ?   ?  ?  Passed - Valid encounter within last 12 months  ?  Recent Outpatient Visits   ? ?      ? 1 month ago Onychomycosis  ? [facility] [place], Cammie Mcgee, MD  ? 6 months ago Syncope, unspecified syncope type  ? [facility] [place], Cammie Mcgee, MD  ? 7 months ago Chest pain, unspecified type  ? [facility] [place], Cammie Mcgee, MD  ? 11 months ago Flank pain  ? [facility] [place], Cammie Mcgee, MD  ? 1 year ago Benign essential HTN  ? [facility]  [place], Cammie Mcgee, MD  ? ?  ?  ? ? ?  ?  ?  ? ? ? ?

## 2022-04-12 ENCOUNTER — Other Ambulatory Visit: Payer: Self-pay | Admitting: Family Medicine

## 2022-04-15 NOTE — Telephone Encounter (Signed)
Requested Prescriptions  Pending Prescriptions Disp Refills  . pantoprazole (PROTONIX) 40 MG tablet [Pharmacy Med Name: PANTOPRAZOLE SOD DR 40 MG TAB] 90 tablet 0    Sig: TAKE 1 TABLET BY MOUTH EVERY DAY     Gastroenterology: Proton Pump Inhibitors Passed - 04/12/2022  8:52 PM      Passed - Valid encounter within last 12 months    Recent Outpatient Visits          1 month ago Onychomycosis   [place] Susy Frizzle, MD   7 months ago Syncope, unspecified syncope type   [place] Susy Frizzle, MD   7 months ago Chest pain, unspecified type   [place] Dennard Schaumann Cammie Mcgee, MD   11 months ago Flank pain   [place] Pick[place], Cammie Mcgee, MD   1 year ago Benign essential HTN   [place] Pickard, Cammie Mcgee, MD

## 2022-04-26 ENCOUNTER — Other Ambulatory Visit: Payer: Self-pay | Admitting: Family Medicine

## 2022-04-28 NOTE — Telephone Encounter (Signed)
Requested medication (s) are due for refill today: yes  Requested medication (s) are on the active medication list: yes  Last refill:  klonopin 04/03/22 #30/0, lamisil 01/21/22   Future visit scheduled: no  Notes to clinic:  klonopin is not delegated and lamisil doesn't have protocol attached. Please assess     Requested Prescriptions  Pending Prescriptions Disp Refills   terbinafine (LAMISIL) 250 MG tablet [Pharmacy Med Name: TERBINAFINE HCL 250 MG TABLET] 30 tablet 2    Sig: TAKE 1 TABLET BY MOUTH EVERY DAY     Off-Protocol Failed - 04/26/2022  8:59 PM      Failed - Medication not assigned to a protocol, review manually.      Passed - Valid encounter within last 12 months    Recent Outpatient Visits           1 month ago Onychomycosis   [place] Dennard Schaumann, Cammie Mcgee, MD   7 months ago Syncope, unspecified syncope type   [place] Pickard, Cammie Mcgee, MD   8 months ago Chest pain, unspecified type   [place] Dennard Schaumann, Cammie Mcgee, MD   11 months ago Flank pain   [place] Dennard Schaumann, Cammie Mcgee, MD   1 year ago Benign essential HTN   [place], Cammie Mcgee, MD                clonazePAM (KLONOPIN) 0.5 MG tablet [Pharmacy Med Name: CLONAZEPAM 0.5 MG TABLET] 30 tablet 0    Sig: TAKE 1 TABLET BY MOUTH TWICE A DAY AS NEEDED FOR ANXIETY     Not Delegated - Psychiatry: Anxiolytics/Hypnotics 2 Failed - 04/26/2022  8:59 PM      Failed - This refill cannot be delegated      Failed - Urine Drug Screen completed in last 360 days      Passed - Patient is not pregnant      Passed - Valid encounter within last 6 months    Recent Outpatient Visits           1 month ago Onychomycosis   [place] Susy Frizzle, MD   7 months ago Syncope, unspecified syncope type   [place] Susy Frizzle, MD   8 months ago Chest pain, unspecified type   [place] Dennard Schaumann Cammie Mcgee, MD   11 months ago Flank pain   [place] Pickard, Cammie Mcgee, MD   1 year ago Benign essential HTN   [place] Pickard, Cammie Mcgee, MD

## 2022-05-24 ENCOUNTER — Other Ambulatory Visit: Payer: Self-pay | Admitting: Family Medicine

## 2022-05-26 NOTE — Telephone Encounter (Signed)
Requested medication (s) are due for refill today - yes  Requested medication (s) are on the active medication list -yes  Future visit scheduled -no  Last refill: 05/04/22 #30  Notes to clinic: non delegated Rx  Requested Prescriptions  Pending Prescriptions Disp Refills   clonazePAM (KLONOPIN) 0.5 MG tablet [Pharmacy Med Name: CLONAZEPAM 0.5 MG TABLET] 30 tablet 0    Sig: TAKE 1 TABLET BY MOUTH TWICE A DAY AS NEEDED FOR ANXIETY     Not Delegated - Psychiatry: Anxiolytics/Hypnotics 2 Failed - 05/24/2022  6:57 PM      Failed - This refill cannot be delegated      Failed - Urine Drug Screen completed in last 360 days      Passed - Patient is not pregnant      Passed - Valid encounter within last 6 months    Recent Outpatient Visits           2 months ago Onychomycosis   [place] Susy Frizzle, MD   8 months ago Syncope, unspecified syncope type   [place] Pickard, Cammie Mcgee, MD   9 months ago Chest pain, unspecified type   [place] Pickard, Cammie Mcgee, MD   1 year ago Flank pain   [place] Dennard Schaumann, Cammie Mcgee, MD   1 year ago Benign essential HTN   [place] Pickard, Cammie Mcgee, MD                 Requested Prescriptions  Pending Prescriptions Disp Refills   clonazePAM (KLONOPIN) 0.5 MG tablet [Pharmacy Med Name: CLONAZEPAM 0.5 MG TABLET] 30 tablet 0    Sig: TAKE 1 TABLET BY MOUTH TWICE A DAY AS NEEDED FOR ANXIETY     Not Delegated - Psychiatry: Anxiolytics/Hypnotics 2 Failed - 05/24/2022  6:57 PM      Failed - This refill cannot be delegated      Failed - Urine Drug Screen completed in last 360 days      Passed - Patient is not pregnant      Passed - Valid encounter within last 6 months    Recent Outpatient Visits           2 months ago Onychomycosis   [place] Susy Frizzle, MD   8 months ago Syncope, unspecified syncope type   [place], Cammie Mcgee, MD   9 months ago Chest pain, unspecified type   [place], Cammie Mcgee, MD   1 year ago Flank pain   [place] Pickard, Cammie Mcgee, MD   1 year ago Benign essential HTN   [place] Pickard, Cammie Mcgee, MD

## 2022-06-17 ENCOUNTER — Other Ambulatory Visit: Payer: Self-pay

## 2022-06-17 MED ORDER — CLONAZEPAM 0.5 MG PO TABS: 0.5000 mg | ORAL_TABLET | Freq: Three times a day (TID) | ORAL | 0 refills | Status: DC | PRN

## 2022-06-17 NOTE — Telephone Encounter (Signed)
LOV 03/07/22 Last refill 05/04/22, #30, 0 refills  Please review, thanks!

## 2022-06-24 ENCOUNTER — Encounter: Payer: Self-pay | Admitting: Neurology

## 2022-06-24 ENCOUNTER — Ambulatory Visit: Payer: PPO | Admitting: Neurology

## 2022-06-24 VITALS — Ht 68.0 in | Wt 230.0 lb

## 2022-06-24 DIAGNOSIS — R7309 Other abnormal glucose: Secondary | ICD-10-CM | POA: Diagnosis not present

## 2022-06-24 DIAGNOSIS — R569 Unspecified convulsions: Secondary | ICD-10-CM | POA: Diagnosis not present

## 2022-06-24 DIAGNOSIS — R0683 Snoring: Secondary | ICD-10-CM | POA: Diagnosis not present

## 2022-06-24 DIAGNOSIS — R55 Syncope and collapse: Secondary | ICD-10-CM

## 2022-06-24 DIAGNOSIS — R7989 Other specified abnormal findings of blood chemistry: Secondary | ICD-10-CM | POA: Diagnosis not present

## 2022-06-24 DIAGNOSIS — R0681 Apnea, not elsewhere classified: Secondary | ICD-10-CM

## 2022-06-24 DIAGNOSIS — F1090 Alcohol use, unspecified, uncomplicated: Secondary | ICD-10-CM

## 2022-06-24 DIAGNOSIS — E669 Obesity, unspecified: Secondary | ICD-10-CM
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
## 2022-06-24 NOTE — Patient Instructions (Addendum)
Vasovagal syncope is one of the most common causes of fainting. Vasovagal syncope occurs when your body overreacts to certain triggers, such as the sight of blood or extreme emotional distress or overheating.  The vasovagal syncope trigger causes a sudden drop in your heart rate and blood pressure, which leads to reduced blood flow to your brain, which results in a brief loss of consciousness. Vasovagal syncope is usually harmless and requires no treatment. However, if you collapse and fall, it is possible you may injure yourself.  Pre-sycopal symptoms include (but are not limited to): Skin paleness, lightheadedness, tunnel vision, nausea, a rising feeling of warmth, feeling cold and clammy, excess yawning, and blurry vision. You may have jerky, abnormal movements, a slow and weak pulse and dilated pupils and you may have some confusion and mental slowing when you come to.  Common triggers for vasovagal syncope include (but are not limited to): Standing for long periods of time, heat exposure, the sight of blood or having blood drawn, fear and straining, such as during a bowel movement.  There is no specific medication for treatment of fainting. Sometimes we use medications to keep the blood pressure elevated but this is rare. Supportive treatment includes foot exercises, wearing compression stockings or tensing your leg muscles when standing, increasing salt in your diet (unless you have high blood pressure). Avoid prolonged standing -- especially in hot, crowded places -- and drink plenty of fluids and change position from sitting to standing or lying to standing slowly and avoid overheating.    We will check blood work today and call you with the test results. We will do an EEG (brainwave test), which we will schedule. We will call you with the results.  Due to alcohol use disorder, you are at risk for alcohol withdrawal seizures.  I highly recommend that you make an appointment with your primary care to  discuss ways to discontinue alcohol use altogether, please also work on smoking cessation, you also have risk factors for sleep apnea and I recommend we proceed with sleep testing.  I am not sure if you have had any seizures.  Nevertheless, please remember, common seizure triggers are: Sleep deprivation, dehydration, overheating, stress, hypoglycemia or skipping meals, certain medications or excessive alcohol use, especially stopping alcohol abruptly if you have had heavy alcohol use before (aka alcohol withdrawal seizure).   I do not recommend that you drive currently as you have had a passing out or seizure-like spell when you were at the wheel recently.  As per [facility] statutes, patients with seizures and epilepsy are not allowed to drive until they have been seizure-free for at least 6 months. This also applies to driving or using heavy equipment or power tools.  Please make sure you follow-up with your cardiologist, I recommend that you continue with remote monitoring of your loop recorder.

## 2022-06-25 ENCOUNTER — Telehealth: Payer: Self-pay

## 2022-06-25 ENCOUNTER — Telehealth: Payer: Self-pay | Admitting: Neurology

## 2022-06-25 NOTE — Telephone Encounter (Signed)
-----   Message from Star Age, MD sent at 06/25/2022  8:21 AM EDT ----- Please call patient: Not all the labs are back but I would like for patient to go ahead and make an appointment with primary care to discuss elevated liver enzymes.  This can be seen in the context of regular alcohol consumption but enzymes are significantly increased compared to last check which was over a year ago.  Also, diabetes marker called A1c is elevated and has increased compared to last check, still in the prediabetes range but I would like for patient to discuss management of prediabetes.

## 2022-06-25 NOTE — Telephone Encounter (Signed)
I attempted to reach the pt to discuss lab work. VM is full and not accepting new messages. Will have to call back.

## 2022-06-25 NOTE — Telephone Encounter (Signed)
I attempted to reach the pt for second time to discuss lab work. VM is full and not accepting new messages. Will have to call back.

## 2022-06-25 NOTE — Telephone Encounter (Signed)
healthteam adv NPR sent to AP

## 2022-06-26 NOTE — Telephone Encounter (Signed)
Called patient unable to leave message because VM is full . Will send mychart message for patient to call office to discuss lab results

## 2022-06-28 LAB — COMPREHENSIVE METABOLIC PANEL
ALT: 82 IU/L — ABNORMAL HIGH (ref 0–44)
AST: 72 IU/L — ABNORMAL HIGH (ref 0–40)
Albumin/Globulin Ratio: 1.7 (ref 1.2–2.2)
Albumin: 4.3 g/dL (ref 3.8–4.9)
Alkaline Phosphatase: 99 IU/L (ref 44–121)
BUN/Creatinine Ratio: 15 (ref 9–20)
BUN: 12 mg/dL (ref 6–24)
Bilirubin Total: <0.2 mg/dL (ref 0.0–1.2)
CO2: 24 mmol/L (ref 20–29)
Calcium: 9.4 mg/dL (ref 8.7–10.2)
Chloride: 101 mmol/L (ref 96–106)
Creatinine, Ser: 0.79 mg/dL (ref 0.76–1.27)
Globulin, Total: 2.5 g/dL (ref 1.5–4.5)
Glucose: 70 mg/dL (ref 70–99)
Potassium: 4.4 mmol/L (ref 3.5–5.2)
Sodium: 139 mmol/L (ref 134–144)
Total Protein: 6.8 g/dL (ref 6.0–8.5)
eGFR: 104 mL/min/{1.73_m2} (ref >59)

## 2022-06-28 LAB — VITAMIN B6: Vitamin B6: 23 ug/L (ref 3.4–65.2)

## 2022-06-28 LAB — VITAMIN B1: Thiamine: 143 nmol/L (ref 66.5–200.0)

## 2022-06-28 LAB — TSH: TSH: 1.27 u[IU]/mL (ref 0.450–4.500)

## 2022-06-28 LAB — AMMONIA: Ammonia: 53 ug/dL (ref 40–200)

## 2022-06-28 LAB — HGB A1C W/O EAG: Hgb A1c MFr Bld: 6.1 % — ABNORMAL HIGH (ref 4.8–5.6)

## 2022-07-01 NOTE — Telephone Encounter (Signed)
Spoke with patient and discussed lab results as noted below by Dr Rexene Alberts. Pt aware that Dr Rexene Alberts advises he make an appt and follow-up with PCP (Dr Dennard Schaumann) regarding the liver enzymes and his HgbA1c. Pt verbalized understanding and appreciation for the call. Results sent to Dr Jenna Luo.

## 2022-07-07 ENCOUNTER — Ambulatory Visit (INDEPENDENT_AMBULATORY_CARE_PROVIDER_SITE_OTHER): Payer: PPO | Admitting: Neurology

## 2022-07-07 DIAGNOSIS — R0683 Snoring: Secondary | ICD-10-CM

## 2022-07-07 DIAGNOSIS — R569 Unspecified convulsions: Secondary | ICD-10-CM

## 2022-07-07 DIAGNOSIS — R0681 Apnea, not elsewhere classified: Secondary | ICD-10-CM

## 2022-07-07 DIAGNOSIS — R55 Syncope and collapse: Secondary | ICD-10-CM

## 2022-07-07 DIAGNOSIS — E669 Obesity, unspecified: Secondary | ICD-10-CM

## 2022-07-07 DIAGNOSIS — F1090 Alcohol use, unspecified, uncomplicated: Secondary | ICD-10-CM

## 2022-07-07 NOTE — Procedures (Signed)
    History:  56 year old man with seizure seizure vs. Syncope   EEG classification: Awake and drowsy  Description of the recording: The background rhythms of this recording consists of a fairly well modulated medium amplitude alpha rhythm of 9.5 Hz that is reactive to eye opening and closure. As the record progresses, the patient appears to remain in the waking state throughout the recording. Photic stimulation was performed, did not show any abnormalities. Hyperventilation was also performed, did not show any abnormalities. Toward the end of the recording, the patient enters the drowsy state with slight symmetric slowing seen. The patient never enters stage II sleep. No abnormal epileptiform discharges seen during this recording. There was no focal slowing. EKG monitor shows no evidence of cardiac rhythm abnormalities with a heart rate of 78.  Abnormality: None   Impression: This is a normal EEG recording in the waking and drowsy state. No evidence of interictal epileptiform discharges seen. A normal EEG does not exclude a diagnosis of epilepsy.    Alric Ran, MD [facility]

## 2022-07-08 ENCOUNTER — Telehealth: Payer: Self-pay | Admitting: *Deleted

## 2022-07-08 ENCOUNTER — Encounter: Payer: Self-pay | Admitting: *Deleted

## 2022-07-08 ENCOUNTER — Other Ambulatory Visit: Payer: Self-pay | Admitting: Family Medicine

## 2022-07-08 NOTE — Telephone Encounter (Signed)
-----   Message from Star Age, MD sent at 07/08/2022  7:51 AM EDT ----- Please call and advise the patient that the EEG or brain wave test we performed was reported as normal in the awake and drowsy states. We checked for abnormal electrical discharges in the brain waves and the report suggested normal findings. No further action is required on this test at this time.  We had discussed pursuing a sleep study during your office visit.  For some reason I did not enter the order at the time, I just entered orders.  Please advise patient that the sleep lab will reach out to him. I have copied Raquel Sarna and Meagan as an [place].  Star Age, MD, PhD

## 2022-07-08 NOTE — Addendum Note (Signed)
Addended by: Star Age on: 07/08/2022 07:51 AM   Modules accepted: Orders

## 2022-07-08 NOTE — Telephone Encounter (Signed)
Called patient to give EEG results and Dr Tori Milks  recommendation. VM was full .Will send Mychart message for patient to call office to discuss results .

## 2022-07-09 ENCOUNTER — Telehealth: Payer: Self-pay | Admitting: Neurology

## 2022-07-09 NOTE — Telephone Encounter (Signed)
Pt called back.  I relayed per Dr. Rexene Alberts that the EEG or brain wave test we performed was reported as normal in the awake and drowsy states. We checked for abnormal electrical discharges in the brain waves and the report suggested normal findings. No further action is required on this test at this time. Dr. Rexene Alberts  had discussed pursuing a sleep study during your office visit.  For some reason she did not enter the order at the time, She just entered orders and the sleep lab will reach out to him.   He asked about the MRI, I see was ordered and sent to [facility] to schedule.  I gave him the # [phone] to call and schedule at [facility].  He appreciated the information.

## 2022-07-09 NOTE — Telephone Encounter (Signed)
HTA pending faxed notes 

## 2022-07-09 NOTE — Telephone Encounter (Deleted)
-----   Message from Star Age, MD sent at 07/08/2022  7:51 AM EDT ----- Please call and advise the patient that the EEG or brain wave test we performed was reported as normal in the awake and drowsy states. We checked for abnormal electrical discharges in the brain waves and the report suggested normal findings. No further action is required on this test at this time.  We had discussed pursuing a sleep study during your office visit.  For some reason I did not enter the order at the time, I just entered orders.  Please advise patient that the sleep lab will reach out to him. I have copied Raquel Sarna and Meagan as an [place].  Star Age, MD, PhD

## 2022-07-14 ENCOUNTER — Telehealth: Payer: Self-pay

## 2022-07-14 NOTE — Telephone Encounter (Signed)
PA-Itraconazole   Send to plan 07/14/22

## 2022-07-17 ENCOUNTER — Encounter: Payer: Self-pay | Admitting: Neurology

## 2022-07-23 NOTE — Telephone Encounter (Signed)
unable to leave vmail mail box is full   HTA auth: [id number] (exp. 07/09/22 to 10/07/22)

## 2022-07-24 ENCOUNTER — Encounter: Payer: Self-pay | Admitting: Neurology

## 2022-07-24 NOTE — Telephone Encounter (Signed)
[name] Key: [id number] - PA Case ID: [id number] - Rx #: [id number] Need help? Call us at [phone]  Outcome Approvedon August 21 21-AUG-23:25-SEP-23 Itraconazole '100MG'$  OR CAPS Quantity:60; Drug Itraconazole '100MG'$  capsules

## 2022-08-04 ENCOUNTER — Other Ambulatory Visit: Payer: Self-pay | Admitting: Family Medicine

## 2022-08-05 ENCOUNTER — Other Ambulatory Visit: Payer: Self-pay | Admitting: Family Medicine

## 2022-08-05 NOTE — Telephone Encounter (Signed)
Received efax from pharmacy to request refill of:  pantoprazole (PROTONIX) 40 MG tablet   LOV: 03/07/22  Pharmacy:   [id number] [place] [facility] [id number] Phone:  [phone] Fax:  [phone] DEA #:  [id number] Please advise pharmacist at [phone]

## 2022-08-05 NOTE — Telephone Encounter (Signed)
Patient called me back and stated he would like to do the HST instead of the NPSG.  I informed him the insurance approved the NPSG but I will have to do the auth for the HST and once its been approve he is aware I will reach out to him to schedule.

## 2022-08-05 NOTE — Telephone Encounter (Signed)
LVM & sent mychart message.  

## 2022-08-06 MED ORDER — PANTOPRAZOLE SODIUM 40 MG PO TBEC: 40.0000 mg | DELAYED_RELEASE_TABLET | Freq: Every day | ORAL | 0 refills | Status: DC

## 2022-08-06 NOTE — Telephone Encounter (Signed)
Requested medication (s) are due for refill today: Yes  Requested medication (s) are on the active medication list: Yes  Last refill:  Clonazepam  07/08/22   Itraconazole  03/07/22  Future visit scheduled: No  Notes to clinic:  See requests.    Requested Prescriptions  Pending Prescriptions Disp Refills   clonazePAM (KLONOPIN) 0.5 MG tablet [Pharmacy Med Name: CLONAZEPAM 0.5 MG TABLET] 30 tablet 0    Sig: TAKE 1 TABLET (0.5 MG TOTAL) BY MOUTH 3 (THREE) TIMES DAILY AS NEEDED FOR ANXIETY.     Not Delegated - Psychiatry: Anxiolytics/Hypnotics 2 Failed - 08/04/2022  8:53 PM      Failed - This refill cannot be delegated      Failed - Urine Drug Screen completed in last 360 days      Passed - Patient is not pregnant      Passed - Valid encounter within last 6 months    Recent Outpatient Visits           5 months ago Onychomycosis   [place] Susy Frizzle, MD   10 months ago Syncope, unspecified syncope type   [place] Pickard, Cammie Mcgee, MD   11 months ago Chest pain, unspecified type   [place] Pickard, Cammie Mcgee, MD   1 year ago Flank pain   [place] Dennard Schaumann, Cammie Mcgee, MD   1 year ago Benign essential HTN   [place] Dennard Schaumann, Cammie Mcgee, MD               itraconazole (SPORANOX) 100 MG capsule [Pharmacy Med Name: ITRACONAZOLE 100 MG CAPSULE] 60 capsule 1    Sig: TAKE 2 CAPSULES BY MOUTH DAILY.     Off-Protocol Failed - 08/04/2022  8:53 PM      Failed - Medication not assigned to a protocol, review manually.      Passed - Valid encounter within last 12 months    Recent Outpatient Visits           5 months ago Onychomycosis   [place] Dennard Schaumann Cammie Mcgee, MD   10 months ago Syncope, unspecified syncope type   [place] Pickard, Cammie Mcgee, MD   11 months ago Chest pain, unspecified type   [place] Pickard, Cammie Mcgee, MD   1 year ago Flank pain   [place] Dennard Schaumann, Cammie Mcgee, MD   1 year ago Benign essential HTN   [place] Pickard, Cammie Mcgee, MD

## 2022-08-06 NOTE — Telephone Encounter (Signed)
Requested Prescriptions  Pending Prescriptions Disp Refills  . pantoprazole (PROTONIX) 40 MG tablet 90 tablet 0    Sig: Take 1 tablet (40 mg total) by mouth daily.     Gastroenterology: Proton Pump Inhibitors Passed - 08/05/2022 11:09 AM      Passed - Valid encounter within last 12 months    Recent Outpatient Visits          5 months ago Onychomycosis   [place], Cammie Mcgee, MD   10 months ago Syncope, unspecified syncope type   [place] Dennard Schaumann Cammie Mcgee, MD   11 months ago Chest pain, unspecified type   [place] Dennard Schaumann Cammie Mcgee, MD   1 year ago Flank pain   [place] Dennard Schaumann, Cammie Mcgee, MD   1 year ago Benign essential HTN   [place] Pickard, Cammie Mcgee, MD

## 2022-08-07 ENCOUNTER — Other Ambulatory Visit: Payer: Self-pay | Admitting: Family Medicine

## 2022-08-07 MED ORDER — CLONAZEPAM 0.5 MG PO TABS
0.5000 mg | ORAL_TABLET | Freq: Three times a day (TID) | ORAL | 0 refills | Status: DC | PRN
Start: 2022-08-07 — End: 2022-09-01

## 2022-08-11 ENCOUNTER — Ambulatory Visit (HOSPITAL_COMMUNITY)
Admission: RE | Admit: 2022-08-11 | Discharge: 2022-08-11 | Disposition: A | Discharge status: Home / Self Care | Payer: PPO | Source: Ambulatory Visit | Attending: Neurology | Admitting: Neurology

## 2022-08-11 DIAGNOSIS — E669 Obesity, unspecified: Secondary | ICD-10-CM | POA: Diagnosis not present

## 2022-08-11 DIAGNOSIS — R0683 Snoring: Secondary | ICD-10-CM | POA: Diagnosis not present

## 2022-08-11 DIAGNOSIS — R55 Syncope and collapse: Secondary | ICD-10-CM | POA: Insufficient documentation

## 2022-08-11 DIAGNOSIS — G319 Degenerative disease of nervous system, unspecified: Secondary | ICD-10-CM | POA: Diagnosis not present

## 2022-08-11 DIAGNOSIS — F1090 Alcohol use, unspecified, uncomplicated: Secondary | ICD-10-CM | POA: Diagnosis not present

## 2022-08-11 DIAGNOSIS — R0681 Apnea, not elsewhere classified: Secondary | ICD-10-CM | POA: Insufficient documentation

## 2022-08-11 DIAGNOSIS — R569 Unspecified convulsions: Secondary | ICD-10-CM | POA: Insufficient documentation

## 2022-08-11 MED ORDER — GADOPICLENOL 0.5 MMOL/ML IV SOLN
10.0000 mL | Freq: Once | INTRAVENOUS | Status: AC | PRN
Start: 2022-08-11 — End: 2022-08-11
  Administered 2022-08-11: 10 mL via INTRAVENOUS

## 2022-08-12 NOTE — Telephone Encounter (Signed)
HST- HTA auth: [id number] (exp. 08/05/22 to 11/03/22)  Patient is scheduled at [facility] for 08/20/22 at 1:30 pm.  Sent mychart message to the patient.

## 2022-08-13 ENCOUNTER — Telehealth: Payer: Self-pay | Admitting: *Deleted

## 2022-08-13 NOTE — Telephone Encounter (Signed)
Spoke with patient and discussed MRI brain results and recommendations as noted by Dr Rexene Alberts. The patient verbalized understanding. He stated he did not have any questions at the time of the call. He will f/u later if he does. Pt will have sleep study on 08/20/22.

## 2022-08-13 NOTE — Telephone Encounter (Signed)
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  None of these are causes for seizures or syncope.  I would like to encourage him to continue to pursue a healthy lifestyle, good hydration with water, complete cessation of alcohol, regular exercise, regular follow-up with primary care for management of chronic medical issues.  We will proceed with a sleep study as discussed.  Please ask him to call Raquel Sarna in the sleep lab if he has not done so yet.  Please provide number if needed.  She sent a MyChart message.

## 2022-08-20 ENCOUNTER — Ambulatory Visit: Payer: PPO | Admitting: Neurology

## 2022-08-20 DIAGNOSIS — F1090 Alcohol use, unspecified, uncomplicated: Secondary | ICD-10-CM

## 2022-08-20 DIAGNOSIS — R0683 Snoring: Secondary | ICD-10-CM

## 2022-08-20 DIAGNOSIS — R55 Syncope and collapse: Secondary | ICD-10-CM

## 2022-08-20 DIAGNOSIS — R569 Unspecified convulsions: Secondary | ICD-10-CM

## 2022-08-20 DIAGNOSIS — G4733 Obstructive sleep apnea (adult) (pediatric): Secondary | ICD-10-CM | POA: Diagnosis not present

## 2022-08-20 DIAGNOSIS — G4734 Idiopathic sleep related nonobstructive alveolar hypoventilation: Secondary | ICD-10-CM

## 2022-08-20 DIAGNOSIS — R0681 Apnea, not elsewhere classified: Secondary | ICD-10-CM

## 2022-08-20 DIAGNOSIS — E669 Obesity, unspecified: Secondary | ICD-10-CM

## 2022-08-21 NOTE — Progress Notes (Signed)
See procedure note.

## 2022-08-27 ENCOUNTER — Other Ambulatory Visit: Payer: Self-pay | Admitting: Family Medicine

## 2022-08-27 NOTE — Telephone Encounter (Signed)
Requested medication (s) are due for refill today: yes  Requested medication (s) are on the active medication list: yes  Last refill:  klonopin- 08/07/22 #30 0 refills , sporanox- 03/07/22 #60 1 refills  Future visit scheduled: no   Notes to clinic:   not delegated per protocol . Med not assigned to a protocol . Do yo want to refill?     Requested Prescriptions  Pending Prescriptions Disp Refills   clonazePAM (KLONOPIN) 0.5 MG tablet [Pharmacy Med Name: CLONAZEPAM 0.5 MG TABLET] 30 tablet 0    Sig: TAKE 1 TABLET (0.5 MG TOTAL) BY MOUTH 3 (THREE) TIMES DAILY AS NEEDED FOR ANXIETY.     Not Delegated - Psychiatry: Anxiolytics/Hypnotics 2 Failed - 08/27/2022  3:56 PM      Failed - This refill cannot be delegated      Failed - Urine Drug Screen completed in last 360 days      Passed - Patient is not pregnant      Passed - Valid encounter within last 6 months    Recent Outpatient Visits           5 months ago Onychomycosis   [place] Susy Frizzle, MD   11 months ago Syncope, unspecified syncope type   [place] Pickard, Cammie Mcgee, MD   12 months ago Chest pain, unspecified type   [place] Pickard, Cammie Mcgee, MD   1 year ago Flank pain   [place] Dennard Schaumann, Cammie Mcgee, MD   1 year ago Benign essential HTN   [place] Dennard Schaumann, Cammie Mcgee, MD               itraconazole (SPORANOX) 100 MG capsule [Pharmacy Med Name: ITRACONAZOLE 100 MG CAPSULE] 60 capsule 1    Sig: TAKE 2 CAPSULES BY MOUTH DAILY.     Off-Protocol Failed - 08/27/2022  3:56 PM      Failed - Medication not assigned to a protocol, review manually.      Passed - Valid encounter within last 12 months    Recent Outpatient Visits           5 months ago Onychomycosis   [place] Dennard Schaumann, Cammie Mcgee, MD   11 months ago Syncope, unspecified syncope type   [place] Pickard, Cammie Mcgee, MD    12 months ago Chest pain, unspecified type   [place] Pickard, Cammie Mcgee, MD   1 year ago Flank pain   [place] Dennard Schaumann, Cammie Mcgee, MD   1 year ago Benign essential HTN   [place] Pickard, Cammie Mcgee, MD              Refused Prescriptions Disp Refills   pantoprazole (PROTONIX) 40 MG tablet [Pharmacy Med Name: PANTOPRAZOLE SOD DR 40 MG TAB] 90 tablet 0    Sig: TAKE 1 TABLET BY MOUTH EVERY DAY     Gastroenterology: Proton Pump Inhibitors Passed - 08/27/2022  3:56 PM      Passed - Valid encounter within last 12 months    Recent Outpatient Visits           5 months ago Onychomycosis   [place] Susy Frizzle, MD   11 months ago Syncope, unspecified syncope type   [place] Susy Frizzle, MD   12 months ago Chest pain, unspecified type   [facility] Susy Frizzle, MD   1 year ago Flank pain   [place] Dennard Schaumann, Cammie Mcgee, MD   1 year ago Benign essential HTN   [place] Pickard, Cammie Mcgee, MD

## 2022-08-27 NOTE — Telephone Encounter (Signed)
Requesting too early last refill 08/06/22 Requested Prescriptions  Pending Prescriptions Disp Refills  . clonazePAM (KLONOPIN) 0.5 MG tablet [Pharmacy Med Name: CLONAZEPAM 0.5 MG TABLET] 30 tablet 0    Sig: TAKE 1 TABLET (0.5 MG TOTAL) BY MOUTH 3 (THREE) TIMES DAILY AS NEEDED FOR ANXIETY.     Not Delegated - Psychiatry: Anxiolytics/Hypnotics 2 Failed - 08/27/2022  3:56 PM      Failed - This refill cannot be delegated      Failed - Urine Drug Screen completed in last 360 days      Passed - Patient is not pregnant      Passed - Valid encounter within last 6 months    Recent Outpatient Visits          5 months ago Onychomycosis   [place] Susy Frizzle, MD   11 months ago Syncope, unspecified syncope type   [place] Pickard, Cammie Mcgee, MD   12 months ago Chest pain, unspecified type   [place] Pickard, Cammie Mcgee, MD   1 year ago Flank pain   [place] Dennard Schaumann, Cammie Mcgee, MD   1 year ago Benign essential HTN   [place] Dennard Schaumann, Cammie Mcgee, MD             . itraconazole (SPORANOX) 100 MG capsule [Pharmacy Med Name: ITRACONAZOLE 100 MG CAPSULE] 60 capsule 1    Sig: TAKE 2 CAPSULES BY MOUTH DAILY.     Off-Protocol Failed - 08/27/2022  3:56 PM      Failed - Medication not assigned to a protocol, review manually.      Passed - Valid encounter within last 12 months    Recent Outpatient Visits          5 months ago Onychomycosis   [place] Dennard Schaumann Cammie Mcgee, MD   11 months ago Syncope, unspecified syncope type   [place] Pickard, Cammie Mcgee, MD   12 months ago Chest pain, unspecified type   [place] Dennard Schaumann, Cammie Mcgee, MD   1 year ago Flank pain   [place] Dennard Schaumann, Cammie Mcgee, MD   1 year ago Benign essential HTN   [place], Cammie Mcgee, MD             Refused Prescriptions  Disp Refills  . pantoprazole (PROTONIX) 40 MG tablet [Pharmacy Med Name: PANTOPRAZOLE SOD DR 40 MG TAB] 90 tablet 0    Sig: TAKE 1 TABLET BY MOUTH EVERY DAY     Gastroenterology: Proton Pump Inhibitors Passed - 08/27/2022  3:56 PM      Passed - Valid encounter within last 12 months    Recent Outpatient Visits          5 months ago Onychomycosis   [place] Susy Frizzle, MD   11 months ago Syncope, unspecified syncope type   [place] Dennard Schaumann Cammie Mcgee, MD   12 months ago Chest pain, unspecified type   [place] Susy Frizzle, MD   1 year ago Flank pain   [place] Pickard, Cammie Mcgee, MD   1 year ago Benign essential HTN   [place] Pickard, Cammie Mcgee, MD

## 2022-09-01 NOTE — Addendum Note (Signed)
Addended by: Star Age on: 09/01/2022 06:38 PM   Modules accepted: Orders

## 2022-09-01 NOTE — Procedures (Signed)
[facility]  HOME SLEEP TEST (Watch PAT) REPORT  STUDY DATE: 08/20/2022  DOB: [birth year]  MRN: [id number]  ORDERING CLINICIAN: Star Age, MD, PhD   REFERRING CLINICIAN: Susy Frizzle, MD   CLINICAL INFORMATION/HISTORY: 56 year old right-handed gentleman with an underlying medical history of diverticulitis, reflux disease, hypertension, hyperlipidemia, left rotator cuff injury, smoking, anxiety, allergies, anemia, history of alcohol use disorder (by chart review, and patient drinks a sixpack per day) and obesity, who reports recurrent passing out spells.  BMI: 34.7 kg/m  FINDINGS:   Sleep Summary:   Total Recording Time (hours, min): 8 hours, 56 min  Total Sleep Time (hours, min):  7 hours, 31 min  Percent REM (%):    19.3%   Respiratory Indices:   Calculated pAHI (per hour):  62.7/hour         REM pAHI:    82.9/hour       NREM pAHI: 58.4/hour  Central pAHI: n/a  Oxygen Saturation Statistics:    Oxygen Saturation (%) Mean: 84%   Minimum oxygen saturation (%):                 67%   O2 Saturation Range (%): 67 - 93%    O2 Saturation (minutes) <=88%: 79.9 min  Pulse Rate Statistics:   Pulse Mean (bpm):    74/min    Pulse Range (49 - 113/min)   IMPRESSION: OSA (obstructive sleep apnea), severe Nocturnal Hypoxemia  RECOMMENDATION:  This home sleep test demonstrates severe obstructive sleep apnea with a total AHI of 62.7/hour and O2 nadir of 67% with significant time below or at 88% saturation of 79.9 minutes, average oxygen saturation was 84% only. The snore and position sensor was not detected during this test.  Treatment with positive airway pressure is highly recommended. This will require - ideally - a full night CPAP titration study for proper treatment settings, O2 monitoring and mask fitting. For now, the patient will be advised to proceed with an autoPAP titration/trial at home.  A laboratory attended titration study can be  considered in the future for optimization of his treatment and better tolerance of therapy.  Alternative treatment options are limited secondary to the severity of the patient's sleep disordered breathing.  Concomitant weight loss and smoking cessation are highly recommended. Please note, that untreated obstructive sleep apnea may carry additional perioperative morbidity. Patients with significant obstructive sleep apnea should receive perioperative PAP therapy and the surgeons and particularly the anesthesiologist should be informed of the diagnosis and the severity of the sleep disordered breathing. The patient should be cautioned not to drive, work at heights, or operate dangerous or heavy equipment when tired or sleepy. Review and reiteration of good sleep hygiene measures should be pursued with any patient. Other causes of the patient's symptoms, including circadian rhythm disturbances, an underlying mood disorder, medication effect and/or an underlying medical problem cannot be ruled out based on this test. Clinical correlation is recommended. The patient and his referring provider will be notified of the test results. The patient will be seen in follow up in sleep clinic at [facility].  I certify that I have reviewed the raw data recording prior to the issuance of this report in accordance with the standards of the American Academy of Sleep Medicine (AASM).  Star Age, MD, PhD [facility]) Diplomat, ABPN (Neurology and Sleep)     INTERPRETING PHYSICIAN:   Star Age, MD, PhD  Board Certified in Neurology and Sleep Medicine  [facility]  [address], [place], [place] [address] [phone]

## 2022-09-02 ENCOUNTER — Telehealth: Payer: Self-pay | Admitting: *Deleted

## 2022-09-02 NOTE — Telephone Encounter (Signed)
-----   Message from Star Age, MD sent at 09/01/2022  6:38 PM EDT ----- Patient referred by primary care for recurrent syncopal events, seen by me on 06/24/2022, patient had a HST on 08/20/2022.    Please call and notify the patient that the recent home sleep test showed obstructive sleep apnea in the rather severe range.  His average oxygen saturation was less than 90% for the night.  I recommend that he follow-up with primary care soon as possible and also with his cardiologist and discuss with primary care a referral to pulmonology as well.  He is advised strongly to continue to work on smoking cessation and weight loss.  I also recommend urgent treatment for sleep apnea with an AutoPap machine.  Please request urgent set up.  Please advise patient that I placed an order in his chart. The DME representative will fit the patient with a mask of choice, educate him on how to use the machine, how to put the mask on, etc. I have placed an order in the chart. Please send the order to a local DME, talk to patient, send report to referring MD. Please also reinforce the need for compliance with treatment. We will need a FU in sleep clinic for 10 weeks post-PAP set up, please arrange that with me or one of our NPs. Thanks,   Star Age, MD, PhD [facility])

## 2022-09-02 NOTE — Telephone Encounter (Signed)
-----   Message from Star Age, MD sent at 09/01/2022  6:38 PM EDT ----- Patient referred by primary care for recurrent syncopal events, seen by me on 06/24/2022, patient had a HST on 08/20/2022.    Please call and notify the patient that the recent home sleep test showed obstructive sleep apnea in the rather severe range.  His average oxygen saturation was less than 90% for the night.  I recommend that he follow-up with primary care soon as possible and also with his cardiologist and discuss with primary care a referral to pulmonology as well.  He is advised strongly to continue to work on smoking cessation and weight loss.  I also recommend urgent treatment for sleep apnea with an AutoPap machine.  Please request urgent set up.  Please advise patient that I placed an order in his chart. The DME representative will fit the patient with a mask of choice, educate him on how to use the machine, how to put the mask on, etc. I have placed an order in the chart. Please send the order to a local DME, talk to patient, send report to referring MD. Please also reinforce the need for compliance with treatment. We will need a FU in sleep clinic for 10 weeks post-PAP set up, please arrange that with me or one of our NPs. Thanks,   Star Age, MD, PhD [facility])

## 2022-09-02 NOTE — Telephone Encounter (Addendum)
LVM for patient to call back for sleep study results Will send patient a mychart message.

## 2022-09-03 ENCOUNTER — Telehealth: Payer: Self-pay

## 2022-09-03 NOTE — Telephone Encounter (Signed)
PA for Itraconazole send to plan

## 2022-09-04 ENCOUNTER — Encounter: Payer: Self-pay | Admitting: *Deleted

## 2022-09-08 NOTE — Telephone Encounter (Signed)
Spoke to patient gave sleep study results. Patient chose [facility] for DME informed patient of compliance for Auto pap machine. Wear Autopap 4+hours nightly and come to all  f/u appointments so insurance is able to help pay for Auto pap machine and supplies . Informed patient DR Rexene Alberts wanted him to call his PCP ASAP to get referral for cardiologist and pulmonology and  continue to work on smoking cessation and weight loss. Pt expressed understanding and thanked me for calling . Faxing orders to [place] today and sent sleep results to referring MD

## 2022-09-08 NOTE — Telephone Encounter (Signed)
PA for Itraconazole '100MG'$  OR CAPS Quantity:60  Denied 09/08/22

## 2022-09-16 ENCOUNTER — Encounter: Payer: Self-pay | Admitting: Neurology

## 2022-09-16 ENCOUNTER — Telehealth: Payer: Self-pay | Admitting: Neurology

## 2022-09-16 NOTE — Telephone Encounter (Signed)
I called and spoke to [place] and relayed that did send our original order by fax and she did receive.  The fax they sent only had the cover sheet twice.  They are processing this for pt now

## 2022-09-16 NOTE — Telephone Encounter (Signed)
[name] called stating that they are needing a signed order for the pt's cpap machine. The one sent yesterday did not have the provider's signature.

## 2022-09-22 DIAGNOSIS — H524 Presbyopia: Secondary | ICD-10-CM | POA: Diagnosis not present

## 2022-09-22 DIAGNOSIS — D3131 Benign neoplasm of right choroid: Secondary | ICD-10-CM | POA: Diagnosis not present

## 2022-09-23 NOTE — Telephone Encounter (Signed)
Approvedon August 21 21-AUG-23:25-SEP-23 Itraconazole '100MG'$  OR CAPS Quantity:60;

## 2022-10-05 ENCOUNTER — Other Ambulatory Visit: Payer: Self-pay | Admitting: Family Medicine

## 2022-10-06 DIAGNOSIS — G4733 Obstructive sleep apnea (adult) (pediatric): Secondary | ICD-10-CM | POA: Diagnosis not present

## 2022-10-06 NOTE — Telephone Encounter (Signed)
Requested Prescriptions  Pending Prescriptions Disp Refills   clonazePAM (KLONOPIN) 0.5 MG tablet [Pharmacy Med Name: CLONAZEPAM 0.5 MG TABLET] 30 tablet 0    Sig: TAKE 1 TABLET (0.5 MG TOTAL) BY MOUTH 3 (THREE) TIMES DAILY AS NEEDED FOR ANXIETY.     Not Delegated - Psychiatry: Anxiolytics/Hypnotics 2 Failed - 10/05/2022  6:45 PM      Failed - This refill cannot be delegated      Failed - Urine Drug Screen completed in last 360 days      Failed - Valid encounter within last 6 months    Recent Outpatient Visits           7 months ago Onychomycosis   [place] Pickard, Cammie Mcgee, MD   1 year ago Syncope, unspecified syncope type   [place] Pickard, Cammie Mcgee, MD   1 year ago Chest pain, unspecified type   [place] Dennard Schaumann, Cammie Mcgee, MD   1 year ago Flank pain   [place] Dennard Schaumann, Cammie Mcgee, MD   1 year ago Benign essential HTN   [place] Pickard, Cammie Mcgee, MD              Passed - Patient is not pregnant       pantoprazole (PROTONIX) 40 MG tablet [Pharmacy Med Name: PANTOPRAZOLE SOD DR 40 MG TAB] 90 tablet 0    Sig: TAKE 1 TABLET BY MOUTH EVERY DAY     Gastroenterology: Proton Pump Inhibitors Passed - 10/05/2022  6:45 PM      Passed - Valid encounter within last 12 months    Recent Outpatient Visits           7 months ago Onychomycosis   [place] Susy Frizzle, MD   1 year ago Syncope, unspecified syncope type   [place] Dennard Schaumann Cammie Mcgee, MD   1 year ago Chest pain, unspecified type   [place] Susy Frizzle, MD   1 year ago Flank pain   [place] Dennard Schaumann, Cammie Mcgee, MD   1 year ago Benign essential HTN   [place] Pickard, Cammie Mcgee, MD

## 2022-10-06 NOTE — Telephone Encounter (Signed)
Requested medication (s) are due for refill today: yes  Requested medication (s) are on the active medication list: yes  Last refill:  09/01/22  Future visit scheduled: yes  Notes to clinic:  Unable to refill per protocol, cannot delegate.      Requested Prescriptions  Pending Prescriptions Disp Refills   clonazePAM (KLONOPIN) 0.5 MG tablet [Pharmacy Med Name: CLONAZEPAM 0.5 MG TABLET] 30 tablet 0    Sig: TAKE 1 TABLET (0.5 MG TOTAL) BY MOUTH 3 (THREE) TIMES DAILY AS NEEDED FOR ANXIETY.     Not Delegated - Psychiatry: Anxiolytics/Hypnotics 2 Failed - 10/05/2022  6:45 PM      Failed - This refill cannot be delegated      Failed - Urine Drug Screen completed in last 360 days      Failed - Valid encounter within last 6 months    Recent Outpatient Visits           7 months ago Onychomycosis   [place] Dennard Schaumann, Cammie Mcgee, MD   1 year ago Syncope, unspecified syncope type   [place] Pickard, Cammie Mcgee, MD   1 year ago Chest pain, unspecified type   [place] Denn[facility] Schaumann, Cammie Mcgee, MD   1 year ago Flank pain   [place] Dennard Schaumann, Cammie Mcgee, MD   1 year ago Benign essential HTN   [place] Pickard, Cammie Mcgee, MD              Passed - Patient is not pregnant      Signed Prescriptions Disp Refills   pantoprazole (PROTONIX) 40 MG tablet 90 tablet 0    Sig: TAKE 1 TABLET BY MOUTH EVERY DAY     Gastroenterology: Proton Pump Inhibitors Passed - 10/05/2022  6:45 PM      Passed - Valid encounter within last 12 months    Recent Outpatient Visits           7 months ago Onychomycosis   [place] Susy Frizzle, MD   1 year ago Syncope, unspecified syncope type   [place] Dennard Schaumann Cammie Mcgee, MD   1 year ago Chest pain, unspecified type   [place] Susy Frizzle, MD   1 year ago Flank pain   [place]  Dennard Schaumann, Cammie Mcgee, MD   1 year ago Benign essential HTN   [place] Pickard, Cammie Mcgee, MD

## 2022-10-14 ENCOUNTER — Ambulatory Visit (INDEPENDENT_AMBULATORY_CARE_PROVIDER_SITE_OTHER): Payer: PPO

## 2022-10-14 VITALS — Ht 68.0 in | Wt 230.0 lb

## 2022-10-14 DIAGNOSIS — Z0000 Encounter for general adult medical examination without abnormal findings: Secondary | ICD-10-CM | POA: Diagnosis not present

## 2022-10-14 NOTE — Patient Instructions (Signed)
[name] , Thank you for taking time to come for your Medicare Wellness Visit. I appreciate your ongoing commitment to your health goals. Please review the following plan we discussed and let me know if I can assist you in the future.   These are the goals we discussed:  Goals      Exercise 3x per week (30 min per time)     Try to move more.         This is a list of the screening recommended for you and due dates:  Health Maintenance  Topic Date Due   COVID-19 Vaccine (4 - 2023-24 season) 11/21/2022*   Zoster (Shingles) Vaccine (1 of 2) 01/14/2023*   Flu Shot  02/22/2023*   Screening for Lung Cancer  10/15/2023*   Hepatitis C Screening: USPSTF Recommendation to screen - Ages 18-79 yo.  10/15/2023*   Medicare Annual Wellness Visit  10/15/2023   Colon Cancer Screening  12/22/2027   HIV Screening  Completed   HPV Vaccine  Aged Out  *Topic was postponed. The date shown is not the original due date.    Advanced directives: Please bring a copy of your health care power of attorney and living will to the office to be added to your chart at your convenience.   Conditions/risks identified: Aim for 30 minutes of exercise or brisk walking, 6-8 glasses of water, and 5 servings of fruits and vegetables each day.   Next appointment: Follow up in one year for your annual wellness visit 09/2023.  Preventive Care 40-64 Years, Male Preventive care refers to lifestyle choices and visits with your health care provider that can promote health and wellness. What does preventive care include? A yearly physical exam. This is also called an annual well check. Dental exams once or twice a year. Routine eye exams. Ask your health care provider how often you should have your eyes checked. Personal lifestyle choices, including: Daily care of your teeth and gums. Regular physical activity. Eating a healthy diet. Avoiding tobacco and drug use. Limiting alcohol use. Practicing safe sex. Taking  low-dose aspirin every day starting at age 27. What happens during an annual well check? The services and screenings done by your health care provider during your annual well check will depend on your age, overall health, lifestyle risk factors, and family history of disease. Counseling  Your health care provider may ask you questions about your: Alcohol use. Tobacco use. Drug use. Emotional well-being. Home and relationship well-being. Sexual activity. Eating habits. Work and work Statistician. Screening  You may have the following tests or measurements: Height, weight, and BMI. Blood pressure. Lipid and cholesterol levels. These may be checked every 5 years, or more frequently if you are over 31 years old. Skin check. Lung cancer screening. You may have this screening every year starting at age 26 if you have a 30-pack-year history of smoking and currently smoke or have quit within the past 15 years. Fecal occult blood test (FOBT) of the stool. You may have this test every year starting at age 62. Flexible sigmoidoscopy or colonoscopy. You may have a sigmoidoscopy every 5 years or a colonoscopy every 10 years starting at age 5. Prostate cancer screening. Recommendations will vary depending on your family history and other risks. Hepatitis C blood test. Hepatitis B blood test. Sexually transmitted disease (STD) testing. Diabetes screening. This is done by checking your blood sugar (glucose) after you have not eaten for a while (fasting). You may have this done  every 1-3 years. Discuss your test results, treatment options, and if necessary, the need for more tests with your health care provider. Vaccines  Your health care provider may recommend certain vaccines, such as: Influenza vaccine. This is recommended every year. Tetanus, diphtheria, and acellular pertussis (Tdap, Td) vaccine. You may need a Td booster every 10 years. Zoster vaccine. You may need this after age  50. Pneumococcal 13-valent conjugate (PCV13) vaccine. You may need this if you have certain conditions and have not been vaccinated. Pneumococcal polysaccharide (PPSV23) vaccine. You may need one or two doses if you smoke cigarettes or if you have certain conditions. Talk to your health care provider about which screenings and vaccines you need and how often you need them. This information is not intended to replace advice given to you by your health care provider. Make sure you discuss any questions you have with your health care provider. Document Released: 12/07/2015 Document Revised: 07/30/2016 Document Reviewed: 09/11/2015 Elsevier Interactive Patient Education  2017 [employer] Prevention in the Home Falls can cause injuries. They can happen to people of all ages. There are many things you can do to make your home safe and to help prevent falls. What can I do on the outside of my home? Regularly fix the edges of walkways and driveways and fix any cracks. Remove anything that might make you trip as you walk through a door, such as a raised step or threshold. Trim any bushes or trees on the path to your home. Use bright outdoor lighting. Clear any walking paths of anything that might make someone trip, such as rocks or tools. Regularly check to see if handrails are loose or broken. Make sure that both sides of any steps have handrails. Any raised decks and porches should have guardrails on the edges. Have any leaves, snow, or ice cleared regularly. Use sand or salt on walking paths during winter. Clean up any spills in your garage right away. This includes oil or grease spills. What can I do in the bathroom? Use night lights. Install grab bars by the toilet and in the tub and shower. Do not use towel bars as grab bars. Use non-skid mats or decals in the tub or shower. If you need to sit down in the shower, use a plastic, non-slip stool. Keep the floor dry. Clean up any water  that spills on the floor as soon as it happens. Remove soap buildup in the tub or shower regularly. Attach bath mats securely with double-sided non-slip rug tape. Do not have throw rugs and other things on the floor that can make you trip. What can I do in the bedroom? Use night lights. Make sure that you have a light by your bed that is easy to reach. Do not use any sheets or blankets that are too big for your bed. They should not hang down onto the floor. Have a firm chair that has side arms. You can use this for support while you get dressed. Do not have throw rugs and other things on the floor that can make you trip. What can I do in the kitchen? Clean up any spills right away. Avoid walking on wet floors. Keep items that you use a lot in easy-to-reach places. If you need to reach something above you, use a strong step stool that has a grab bar. Keep electrical cords out of the way. Do not use floor polish or wax that makes floors slippery. If you must use  wax, use non-skid floor wax. Do not have throw rugs and other things on the floor that can make you trip. What can I do with my stairs? Do not leave any items on the stairs. Make sure that there are handrails on both sides of the stairs and use them. Fix handrails that are broken or loose. Make sure that handrails are as long as the stairways. Check any carpeting to make sure that it is firmly attached to the stairs. Fix any carpet that is loose or worn. Avoid having throw rugs at the top or bottom of the stairs. If you do have throw rugs, attach them to the floor with carpet tape. Make sure that you have a light switch at the top of the stairs and the bottom of the stairs. If you do not have them, ask someone to add them for you. What else can I do to help prevent falls? Wear shoes that: Do not have high heels. Have rubber bottoms. Are comfortable and fit you well. Are closed at the toe. Do not wear sandals. If you use a  stepladder: Make sure that it is fully opened. Do not climb a closed stepladder. Make sure that both sides of the stepladder are locked into place. Ask someone to hold it for you, if possible. Clearly mark and make sure that you can see: Any grab bars or handrails. First and last steps. Where the edge of each step is. Use tools that help you move around (mobility aids) if they are needed. These include: Canes. Walkers. Scooters. Crutches. Turn on the lights when you go into a dark area. Replace any light bulbs as soon as they burn out. Set up your furniture so you have a clear path. Avoid moving your furniture around. If any of your floors are uneven, fix them. If there are any pets around you, be aware of where they are. Review your medicines with your doctor. Some medicines can make you feel dizzy. This can increase your chance of falling. Ask your doctor what other things that you can do to help prevent falls. This information is not intended to replace advice given to you by your health care provider. Make sure you discuss any questions you have with your health care provider. Document Released: 09/06/2009 Document Revised: 04/17/2016 Document Reviewed: 12/15/2014 Elsevier Interactive Patient Education  2017 [employer].

## 2022-10-14 NOTE — Progress Notes (Signed)
Subjective:   [name] is a 56 y.o. male who presents for Medicare Annual/Subsequent preventive examination. Virtual Visit via Telephone Note  I connected with  [name] on 10/14/22 at  3:10 PM EST by telephone and verified that I am speaking with the correct person using two identifiers.  Location: Patient: HOME Provider: [facility] Persons participating in the virtual visit: patient/Nurse Health Advisor   I discussed the limitations, risks, security and privacy concerns of performing an evaluation and management service by telephone and the availability of in person appointments. The patient expressed understanding and agreed to proceed.  Interactive audio and video telecommunications were attempted between this nurse and patient, however failed, due to patient having technical difficulties OR patient did not have access to video capability.  We continued and completed visit with audio only.  Some vital signs may be absent or patient reported.   Chriss Driver, LPN  Review of Systems     Cardiac Risk Factors include: advanced age (>78mn, >>67women);hypertension;dyslipidemia;male gender;obesity (BMI >30kg/m2);sedentary lifestyle;smoking/ tobacco exposure     Objective:    Today's Vitals   10/14/22 1504  Weight: 230 lb (104.3 kg)  Height: '5\' 8"'$  (1.727 m)   Body mass index is 34.97 kg/m.     10/14/2022    3:07 PM 09/06/2021    1:00 AM 02/09/2019    9:47 AM 02/08/2019    5:07 PM 12/21/2017    8:04 AM 10/27/2017   10:11 AM 04/14/2016   10:55 AM  Advanced Directives  Does Patient Have a Medical Advance Directive? Yes No Yes Yes No No No  Type of [place]  Healthcare Power of [place]    Does patient want to make changes to medical advance directive?   No - Patient declined      Copy of [place] Chart? No - copy requested  No - copy requested      Would  patient like information on creating a medical advance directive?  No - Patient declined   No - Patient declined No - Patient declined No - patient declined information    Current Medications (verified) Outpatient Encounter Medications as of 10/14/2022  Medication Sig   amLODipine (NORVASC) 10 MG tablet TAKE 1 TABLET BY MOUTH EVERY DAY   atorvastatin (LIPITOR) 40 MG tablet TAKE 1 TABLET BY MOUTH EVERY DAY (Patient taking differently: Take 40 mg by mouth daily.)   carvedilol (COREG) 12.5 MG tablet TAKE 1 TABLET (12.5 MG TOTAL) BY MOUTH 2 (TWO) TIMES DAILY WITH A MEAL.   clonazePAM (KLONOPIN) 0.5 MG tablet TAKE 1 TABLET (0.5 MG TOTAL) BY MOUTH 3 (THREE) TIMES DAILY AS NEEDED FOR ANXIETY.   cyclobenzaprine (FLEXERIL) 10 MG tablet TAKE 1 TABLET BY MOUTH EVERYDAY AT BEDTIME (Patient taking differently: Take 10 mg by mouth at bedtime.)   fluticasone (FLONASE) 50 MCG/ACT nasal spray SPRAY 2 SPRAYS INTO EACH NOSTRIL EVERY DAY   losartan (COZAAR) 50 MG tablet TAKE 1 TABLET BY MOUTH EVERY DAY   Menthol (BIOFREEZE) 10.5 % AERO Apply 1-2 sprays topically as needed (pain).   pantoprazole (PROTONIX) 40 MG tablet TAKE 1 TABLET BY MOUTH EVERY DAY   sildenafil (VIAGRA) 100 MG tablet TAKE 1 TABLET BY MOUTH EVERY DAY AS NEEDED FOR ERECTILE DYSFUNCTION   venlafaxine XR (EFFEXOR-XR) 150 MG 24 hr capsule TAKE 1 CAPSULE BY MOUTH EVERY DAY IN THE MORNING   [DISCONTINUED] itraconazole (SPORANOX)  100 MG capsule TAKE 2 CAPSULES BY MOUTH DAILY.   [DISCONTINUED] terbinafine (LAMISIL) 250 MG tablet TAKE 1 TABLET BY MOUTH EVERY DAY   No facility-administered encounter medications on file as of 10/14/2022.    Allergies (verified) Patient has no known allergies.   History: Past Medical History:  Diagnosis Date   Allergy    Anemia    Colonic diverticular abscess 12/26/2014   Diverticulitis    ETOH abuse    GAD (generalized anxiety disorder)    GERD (gastroesophageal reflux disease)    Hypercholesteremia     Hypertension    Left rotator cuff tear 02/08/2019   Superior labrum anterior-to-posterior (SLAP) tear of left shoulder 02/08/2019   Tobacco abuse    Past Surgical History:  Procedure Laterality Date   APPENDECTOMY N/A 01/01/2015   Procedure: INCIDENTAL APPENDECTOMY;  Surgeon: Jamesetta So, MD;  Location: [facility];  Service: General;  Laterality: N/A;   BACK SURGERY     lower back, fusion   COLONOSCOPY WITH PROPOFOL N/A 12/21/2017   Procedure: COLONOSCOPY WITH PROPOFOL;  Surgeon: Daneil Dolin, MD;  [facility];  Service: Endoscopy;  Laterality: N/A;  9:45am   GASTRECTOMY N/A 04/14/2016   Procedure: GASTRORRHAPHY;  Surgeon: Aviva Signs, MD;  Location: [facility];  Service: General;  Laterality: N/A;   [place]     as child   LOOP RECORDER INSERTION N/A 09/06/2021   Procedure: LOOP RECORDER INSERTION;  Surgeon: Sanda Klein, MD;  Location: [facility];  Service: Cardiovascular;  Laterality: N/A;   PARTIAL COLECTOMY N/A 01/01/2015   Procedure: PARTIAL COLECTOMY;  Surgeon: Jamesetta So, MD;  Location: [facility];  Service: General;  Laterality: N/A;   POLYPECTOMY  12/21/2017   Procedure: POLYPECTOMY;  Surgeon: Daneil Dolin, MD;  [facility];  Service: Endoscopy;;  colon   ROTATOR CUFF REPAIR Bilateral    SHOULDER ARTHROSCOPY WITH ROTATOR CUFF REPAIR AND SUBACROMIAL DECOMPRESSION Left 02/09/2019   Procedure: SHOULDER ARTHROSCOPY WITH ROTATOR CUFF REPAIR AND SUBACROMIAL PARTIAL ACROMIOPLASTY, DISTAL CLAVICULECTOMY, AND EXTENSIVE DEBRIDEMENT;  Surgeon: Elsie Saas, MD;  Location: [facility];  Service: Orthopedics;  Laterality: Left;   Family History  Problem Relation Age of Onset   Diabetes Mother    Stroke Mother    Stroke Father    CAD Father    Colon cancer Neg Hx    Social History   Socioeconomic History   Marital status: Married    Spouse name: Not on file   Number of children: Not on file   Years of  education: Not on file   Highest education level: Not on file  Occupational History   Not on file  Tobacco Use   Smoking status: Every Day    Packs/day: 1.00    Years: 35.00    Total pack years: 35.00    Types: Cigarettes   Smokeless tobacco: Never   Tobacco comments:    1.5 ppd since age 26.   Vaping Use   Vaping Use: Never used  Substance and Sexual Activity   Alcohol use: Yes    Alcohol/week: 48.0 standard drinks of alcohol    Types: 48 Cans of beer per week    Comment: patient states "about 5 or 6 a day" (beers)   Drug use: No   Sexual activity: Yes  Other Topics Concern   Not on file  Social History Narrative   Not on file   Social  Determinants of Health   Financial Resource Strain: Not on file  Food Insecurity: No Food Insecurity (10/14/2022)   Hunger Vital Sign    Worried About Running Out of Food in the Last Year: Never true    Ran Out of Food in the Last Year: Never true  Transportation Needs: No Transportation Needs (10/14/2022)   PRAPARE - Hydrologist (Medical): No    Lack of Transportation (Non-Medical): No  Physical Activity: Insufficiently Active (10/14/2022)   Exercise Vital Sign    Days of Exercise per Week: 3 days    Minutes of Exercise per Session: 10 min  Stress: No Stress Concern Present (10/14/2022)   Independence    Feeling of Stress : Only a little  Social Connections: Unknown (10/14/2022)   Social Connection and Isolation Panel [NHANES]    Frequency of Communication with Friends and Family: Not on file    Frequency of Social Gatherings with Friends and Family: Not on file    Attends Religious Services: Never    Marine scientist or Organizations: Not on file    Attends Archivist Meetings: Not on file    Marital Status: Not on file    Tobacco Counseling Ready to quit: Not Answered Counseling given: Not Answered Tobacco comments:  1.5 ppd since age 15.    Clinical Intake:  Pre-visit preparation completed: Yes  Pain : No/denies pain     BMI - recorded: 34.97 Nutritional Status: BMI > 30  Obese Nutritional Risks: None Diabetes: No  How often do you need to have someone help you when you read instructions, pamphlets, or other written materials from your doctor or pharmacy?: 1 - Never  Diabetic?NO  Interpreter Needed?: No  Information entered by :: mj Advait Buice, lpn   Activities of Daily Living    10/14/2022    1:59 PM  In your present state of health, do you have any difficulty performing the following activities:  Hearing? 0  Vision? 0  Difficulty concentrating or making decisions? 1  Walking or climbing stairs? 1  Dressing or bathing? 1  Doing errands, shopping? 1  Preparing Food and eating ? N  Using the Toilet? N  In the past six months, have you accidently leaked urine? N  Do you have problems with loss of bowel control? N  Managing your Medications? N  Managing your Finances? N  Housekeeping or managing your Housekeeping? Y    Patient Care Team: Susy Frizzle, MD as PCP - General (Family Medicine) Gala Romney Cristopher Estimable, MD as Consulting Physician (Gastroenterology)  Indicate any recent Medical Services you may have received from other than [facility] providers in the past year (date may be approximate).     Assessment:   This is a routine wellness examination for [name].  Hearing/Vision screen Hearing Screening - Comments:: No hearing issues. Vision Screening - Comments:: Dr. Rosana Hoes, 2023.  Dietary issues and exercise activities discussed: Current Exercise Habits: Home exercise routine, Type of exercise: walking, Time (Minutes): 10, Frequency (Times/Week): 3, Weekly Exercise (Minutes/Week): 30, Intensity: Mild, Exercise limited by: cardiac condition(s)   Goals Addressed             This Visit's Progress    Exercise 3x per week (30 min per time)       Try to move more.         Depression Screen    10/14/2022    3:06 PM 05/09/2021  12:30 PM 05/09/2021   12:28 PM 01/31/2021   11:32 AM 07/07/2017    2:26 PM 08/22/2016    2:30 PM 12/21/2014    2:06 PM  PHQ 2/9 Scores  PHQ - 2 Score 0 0 0 0 2 5 0  PHQ- 9 Score  '3  3 12 18     '$ Fall Risk    10/14/2022    1:59 PM 05/09/2021   12:28 PM 01/31/2021   11:31 AM 08/22/2016    2:30 PM 12/21/2014    2:06 PM  Fall Risk   Falls in the past year? 1 0 0 No Yes  Number falls in past yr: 0 0   1  Injury with Fall? 1 0   Yes  Comment     rib fx  Risk for fall due to : History of fall(s);Impaired balance/gait No Fall Risks No Fall Risks    Follow up Falls prevention discussed Falls evaluation completed Falls evaluation completed      [place]:  Any stairs in or around the home? Yes  If so, are there any without handrails? No  Home free of loose throw rugs in walkways, pet beds, electrical cords, etc? Yes  Adequate lighting in your home to reduce risk of falls? Yes   ASSISTIVE DEVICES UTILIZED TO PREVENT FALLS:  Life alert? No  Use of a cane, walker or w/c? No  Grab bars in the bathroom? No  Shower chair or bench in shower? No  Elevated toilet seat or a handicapped toilet? No   TIMED UP AND GO:  Was the test performed? No .  Phone visit.   Cognitive Function:        10/14/2022    3:08 PM  6CIT Screen  What Year? 0 points  What month? 0 points  What time? 0 points  Count back from 20 0 points  Months in reverse 0 points  Repeat phrase 2 points  Total Score 2 points    Immunizations Immunization History  Administered Date(s) Administered   Influenza Split 12/02/2012   Influenza,inj,quad, With Preservative 08/24/2017   Moderna Sars-Covid-2 Vaccination 06/05/2020, 07/02/2020, 01/23/2021   Pneumococcal Polysaccharide-23 12/09/2014   Td 09/01/2006   Td,absorbed, Preservative Free, Adult Use, Lf Unspecified 09/01/2006    TDAP status: Due, Education has been  provided regarding the importance of this vaccine. Advised may receive this vaccine at local pharmacy or Health Dept. Aware to provide a copy of the vaccination record if obtained from local pharmacy or Health Dept. Verbalized acceptance and understanding.  Flu Vaccine status: Declined, Education has been provided regarding the importance of this vaccine but patient still declined. Advised may receive this vaccine at local pharmacy or Health Dept. Aware to provide a copy of the vaccination record if obtained from local pharmacy or Health Dept. Verbalized acceptance and understanding.  Pneumococcal vaccine status: Up to date  Covid-19 vaccine status: Completed vaccines  Qualifies for Shingles Vaccine? Yes   Zostavax completed No   Shingrix Completed?: No.    Education has been provided regarding the importance of this vaccine. Patient has been advised to call insurance company to determine out of pocket expense if they have not yet received this vaccine. Advised may also receive vaccine at local pharmacy or Health Dept. Verbalized acceptance and understanding.  Screening Tests Health Maintenance  Topic Date Due   COVID-19 Vaccine (4 - 2023-24 season) 11/21/2022 (Originally 07/25/2022)   Zoster Vaccines- Shingrix (1 of 2) 01/14/2023 (Originally  09[birth year]/1983)   INFLUENZA VACCINE  02/22/2023 (Originally 06/24/2022)   Lung Cancer Screening  10/15/2023 (Originally 09/06/2022)   Hepatitis C Screening  10/15/2023 (Originally 09/03/1982)   Medicare Annual Wellness (AWV)  10/15/2023   COLONOSCOPY (Pts 45-18yr Insurance coverage will need to be confirmed)  12/22/2027   HIV Screening  Completed   HPV VACCINES  Aged Out    Health Maintenance  There are no preventive care reminders to display for this patient.   Colorectal cancer screening: Type of screening: Colonoscopy. Completed 12/21/2017. Repeat every 10 years  Lung Cancer Screening: (Low Dose CT Chest recommended if Age 58-80years, 30 pack-year  currently smoking OR have quit w/in 15years.) does qualify.   Lung Cancer Screening Referral: Pt declined at this time.  Additional Screening:  Hepatitis C Screening: does qualify; Completed declined.  Vision Screening: Recommended annual ophthalmology exams for early detection of glaucoma and other disorders of the eye. Is the patient up to date with their annual eye exam?  Yes  Who is the provider or what is the name of the office in which the patient attends annual eye exams? Dr. DRosana HoesIf pt is not established with a provider, would they like to be referred to a provider to establish care? No .   Dental Screening: Recommended annual dental exams for proper oral hygiene  Community Resource Referral / Chronic Care Management: CRR required this visit?  No   CCM required this visit?  No      Plan:     I have personally reviewed and noted the following in the patient's chart:   Medical and social history Use of alcohol, tobacco or illicit drugs  Current medications and supplements including opioid prescriptions. Patient is currently taking opioid prescriptions. Information provided to patient regarding non-opioid alternatives. Patient advised to discuss non-opioid treatment plan with their provider. Functional ability and status Nutritional status Physical activity Advanced directives List of other physicians Hospitalizations, surgeries, and ER visits in previous 12 months Vitals Screenings to include cognitive, depression, and falls Referrals and appointments  In addition, I have reviewed and discussed with patient certain preventive protocols, quality metrics, and best practice recommendations. A written personalized care plan for preventive services as well as general preventive health recommendations were provided to patient.     MChriss Driver LPN   [id number]  Nurse Notes: Discussed Shingrix, Flu and Tdap and how to obtain. Pt verbalized understanding of all.

## 2022-11-02 ENCOUNTER — Other Ambulatory Visit: Payer: Self-pay | Admitting: Family Medicine

## 2022-11-05 DIAGNOSIS — G4733 Obstructive sleep apnea (adult) (pediatric): Secondary | ICD-10-CM | POA: Diagnosis not present

## 2022-11-30 ENCOUNTER — Other Ambulatory Visit: Payer: Self-pay | Admitting: Family Medicine

## 2022-12-01 ENCOUNTER — Telehealth: Payer: Self-pay | Admitting: Family Medicine

## 2022-12-01 ENCOUNTER — Other Ambulatory Visit: Payer: Self-pay

## 2022-12-01 DIAGNOSIS — F411 Generalized anxiety disorder: Secondary | ICD-10-CM

## 2022-12-01 MED ORDER — VENLAFAXINE HCL ER 150 MG PO CP24: ORAL_CAPSULE | ORAL | 0 refills | Status: DC

## 2022-12-01 NOTE — Telephone Encounter (Signed)
Pharmacy also sent an eFax for a refill request of  venlafaxine XR (EFFEXOR-XR) 150 MG 24 hr capsule   Please advise pharmacist.

## 2022-12-01 NOTE — Telephone Encounter (Signed)
Prescription Request  12/01/2022  Is this a "Controlled Substance" medicine?    LOV: 03/07/2022  What is the name of the medication or equipment?   pantoprazole (PROTONIX) 40 MG tablet   Have you contacted your pharmacy to request a refill? Yes   Which pharmacy would you like this sent to?  [id number] [place] [address] [address]Phone: [phone]: [phone]   Patient notified that their request is being sent to the clinical staff for review and that they should receive a response within 2 business days.   Please advise pharmacist at [phone]

## 2022-12-06 DIAGNOSIS — G4733 Obstructive sleep apnea (adult) (pediatric): Secondary | ICD-10-CM | POA: Diagnosis not present

## 2022-12-12 ENCOUNTER — Encounter: Payer: Self-pay | Admitting: *Deleted

## 2022-12-22 ENCOUNTER — Ambulatory Visit: Payer: PPO | Admitting: Neurology

## 2022-12-25 ENCOUNTER — Telehealth: Payer: Self-pay | Admitting: *Deleted

## 2022-12-25 NOTE — Telephone Encounter (Signed)
Received cpap download, (placed in in box for review).  His 90 day window is 01-06-2023 has not used very much.  He has no appt at this time.

## 2022-12-28 ENCOUNTER — Other Ambulatory Visit: Payer: Self-pay | Admitting: Family Medicine

## 2022-12-29 ENCOUNTER — Other Ambulatory Visit: Payer: Self-pay

## 2022-12-29 ENCOUNTER — Other Ambulatory Visit: Payer: Self-pay | Admitting: Family Medicine

## 2022-12-29 DIAGNOSIS — K271 Acute peptic ulcer, site unspecified, with perforation: Secondary | ICD-10-CM

## 2022-12-29 DIAGNOSIS — F1010 Alcohol abuse, uncomplicated: Secondary | ICD-10-CM

## 2022-12-29 DIAGNOSIS — K5720 Diverticulitis of large intestine with perforation and abscess without bleeding: Secondary | ICD-10-CM

## 2022-12-29 MED ORDER — PANTOPRAZOLE SODIUM 40 MG PO TBEC: 40.0000 mg | DELAYED_RELEASE_TABLET | Freq: Every day | ORAL | 0 refills | Status: DC

## 2022-12-29 NOTE — Telephone Encounter (Signed)
Prescription Request  12/29/2022  Is this a "Controlled Substance" medicine? No  LOV: 03/07/2022  What is the name of the medication or equipment? pantoprazole (PROTONIX) 40 MG tablet   Have you contacted your pharmacy to request a refill? Yes   Which pharmacy would you like this sent to?  [id number] [place] [address] [address]Phone: [phone]: [phone]   Patient notified that their request is being sent to the clinical staff for review and that they should receive a response within 2 business days.   [phone]

## 2022-12-30 NOTE — Telephone Encounter (Signed)
Disp Refills Start End   pantoprazole (PROTONIX) 40 MG tablet 90 tablet 0 12/29/2022    Sig - Route: Take 1 tablet (40 mg total) by mouth daily. - Oral   Sent to pharmacy as: pantoprazole (PROTONIX) 40 MG tablet   Notes to Pharmacy: Pt needs physical exam in April 2024.   E-Prescribing Status: Receipt confirmed by pharmacy (12/29/2022  3:53 PM EST)     Requested Prescriptions  Refused Prescriptions Disp Refills   pantoprazole (PROTONIX) 40 MG tablet 90 tablet 0    Sig: Take 1 tablet (40 mg total) by mouth daily.     Gastroenterology: Proton Pump Inhibitors Passed - 12/29/2022  4:10 PM      Passed - Valid encounter within last 12 months    Recent Outpatient Visits           9 months ago Onychomycosis   [place] [facility] Schaumann, Cammie Mcgee, MD   1 year ago Syncope, unspecified syncope type   [place] Pickard, Cammie Mcgee, MD   1 year ago Chest pain, unspecified type   [place] Susy Frizzle, MD   1 year ago Flank pain   [place] [facility] Schaumann Cammie Mcgee, MD   1 year ago Benign essential HTN   [place] Pickard, Cammie Mcgee, MD       Future Appointments             In 1 month Pickard, Cammie Mcgee, MD [place]

## 2023-01-12 ENCOUNTER — Other Ambulatory Visit: Payer: Self-pay | Admitting: Family Medicine

## 2023-01-12 MED ORDER — CLONAZEPAM 0.5 MG PO TABS: 0.5000 mg | ORAL_TABLET | Freq: Three times a day (TID) | ORAL | 0 refills | Status: DC | PRN

## 2023-01-22 ENCOUNTER — Encounter: Payer: Self-pay | Admitting: Radiology

## 2023-02-09 ENCOUNTER — Ambulatory Visit (INDEPENDENT_AMBULATORY_CARE_PROVIDER_SITE_OTHER): Payer: PPO | Admitting: Family Medicine

## 2023-02-09 VITALS — BP 132/76 | HR 78 | Temp 98.1°F | Ht 68.0 in | Wt 224.6 lb

## 2023-02-09 DIAGNOSIS — F17200 Nicotine dependence, unspecified, uncomplicated: Secondary | ICD-10-CM | POA: Diagnosis not present

## 2023-02-09 DIAGNOSIS — G4733 Obstructive sleep apnea (adult) (pediatric): Secondary | ICD-10-CM | POA: Diagnosis not present

## 2023-02-09 DIAGNOSIS — I10 Essential (primary) hypertension: Secondary | ICD-10-CM | POA: Diagnosis not present

## 2023-02-09 DIAGNOSIS — Z0000 Encounter for general adult medical examination without abnormal findings: Secondary | ICD-10-CM

## 2023-02-09 DIAGNOSIS — Z125 Encounter for screening for malignant neoplasm of prostate: Secondary | ICD-10-CM | POA: Diagnosis not present

## 2023-02-09 DIAGNOSIS — Z23 Encounter for immunization: Secondary | ICD-10-CM

## 2023-02-09 DIAGNOSIS — E7800 Pure hypercholesterolemia, unspecified: Secondary | ICD-10-CM | POA: Diagnosis not present

## 2023-02-09 DIAGNOSIS — F411 Generalized anxiety disorder: Secondary | ICD-10-CM

## 2023-02-09 DIAGNOSIS — Z0001 Encounter for general adult medical examination with abnormal findings: Secondary | ICD-10-CM

## 2023-02-09 MED ORDER — CLONAZEPAM 0.5 MG PO TABS: 0.5000 mg | ORAL_TABLET | Freq: Three times a day (TID) | ORAL | 0 refills | Status: DC | PRN

## 2023-02-09 NOTE — Addendum Note (Signed)
Addended by: Jenna Luo T on: 02/09/2023 11:06 AM   Modules accepted: Orders

## 2023-02-09 NOTE — Addendum Note (Signed)
Addended by: Randal Buba K on: 02/09/2023 12:43 PM   Modules accepted: Orders

## 2023-02-09 NOTE — Progress Notes (Signed)
Subjective:    Patient ID: [name], male    DOB: December 06, 1965, 57 y.o.   MRN: [id number]  Patient is a very pleasant 57 year old Caucasian male here today for a CPE.  Patient has been diagnosed with severe obstructive sleep apnea.  His wife states that his apnea-hypopnea index was greater than 80.  He has tried several different CPAP mask and been unable to tolerate any of them under the care of his neurologist.  He is interested in seeing an ear nose and throat physician to discuss possible surgical treatment.  His last colonoscopy was in 2019.  They did find a tubular adenoma at that time.  He may be due for his colonoscopy this year.  I encouraged him to reach out to Dr. Gala Romney to discuss this.  He also smokes.  He is due for lung cancer screening.  He does report some shortness of breath with activity but he denies any angina.  He is wheezing slightly on exam today however the shortness of breath is minimal.  He is not currently taking any bronchodilators.  He is due for prostate cancer screening.  I reviewed his shot record.  He is due for Prevnar 20 because of his smoking.  He is due for Shingrix.  He is due for a tetanus shot.  He is due for COVID booster as well as an RSV vaccine. Past Medical History:  Diagnosis Date   Allergy    Anemia    Colonic diverticular abscess 12/26/2014   Diverticulitis    ETOH abuse    GAD (generalized anxiety disorder)    GERD (gastroesophageal reflux disease)    Hypercholesteremia    Hypertension    Left rotator cuff tear 02/08/2019   Superior labrum anterior-to-posterior (SLAP) tear of left shoulder 02/08/2019   Tobacco abuse    Past Surgical History:  Procedure Laterality Date   APPENDECTOMY N/A 01/01/2015   Procedure: INCIDENTAL APPENDECTOMY;  Surgeon: Jamesetta So, MD;  Location: [facility];  Service: General;  Laterality: N/A;   BACK SURGERY     lower back, fusion   COLONOSCOPY WITH PROPOFOL N/A 12/21/2017   Procedure: COLONOSCOPY WITH  PROPOFOL;  Surgeon: Daneil Dolin, MD;  [facility];  Service: Endoscopy;  Laterality: N/A;  9:45am   GASTRECTOMY N/A 04/14/2016   Procedure: GASTRORRHAPHY;  Surgeon: Aviva Signs, MD;  Location: [facility];  Service: General;  Laterality: N/A;   [place]     as child   LOOP RECORDER INSERTION N/A 09/06/2021   Procedure: LOOP RECORDER INSERTION;  Surgeon: Sanda Klein, MD;  Location: [place] [facility];  Service: Cardiovascular;  Laterality: N/A;   PARTIAL COLECTOMY N/A 01/01/2015   Procedure: PARTIAL COLECTOMY;  Surgeon: Jamesetta So, MD;  Location: [facility];  Service: General;  Laterality: N/A;   POLYPECTOMY  12/21/2017   Procedure: POLYPECTOMY;  Surgeon: Daneil Dolin, MD;  [facility];  Service: Endoscopy;;  colon   ROTATOR CUFF REPAIR Bilateral    SHOULDER ARTHROSCOPY WITH ROTATOR CUFF REPAIR AND SUBACROMIAL DECOMPRESSION Left 02/09/2019   Procedure: SHOULDER ARTHROSCOPY WITH ROTATOR CUFF REPAIR AND SUBACROMIAL PARTIAL ACROMIOPLASTY, DISTAL CLAVICULECTOMY, AND EXTENSIVE DEBRIDEMENT;  Surgeon: Elsie Saas, MD;  Location: [place];  Service: Orthopedics;  Laterality: Left;   Current Outpatient Medications on File Prior to Visit  Medication Sig Dispense Refill   amLODipine (NORVASC) 10 MG tablet TAKE 1 TABLET BY MOUTH EVERY DAY 90 tablet  3   atorvastatin (LIPITOR) 40 MG tablet TAKE 1 TABLET BY MOUTH EVERY DAY (Patient taking differently: Take 40 mg by mouth daily.) 90 tablet 1   carvedilol (COREG) 12.5 MG tablet TAKE 1 TABLET (12.5 MG TOTAL) BY MOUTH 2 (TWO) TIMES DAILY WITH A MEAL. 180 tablet 3   clonazePAM (KLONOPIN) 0.5 MG tablet Take 1 tablet (0.5 mg total) by mouth 3 (three) times daily as needed for anxiety. 30 tablet 0   cyclobenzaprine (FLEXERIL) 10 MG tablet TAKE 1 TABLET BY MOUTH EVERYDAY AT BEDTIME (Patient taking differently: Take 10 mg by mouth at bedtime.) 30 tablet 2   fluticasone (FLONASE) 50 MCG/ACT nasal  spray SPRAY 2 SPRAYS INTO EACH NOSTRIL EVERY DAY 48 mL 3   losartan (COZAAR) 50 MG tablet TAKE 1 TABLET BY MOUTH EVERY DAY 90 tablet 3   Menthol (BIOFREEZE) 10.5 % AERO Apply 1-2 sprays topically as needed (pain).     pantoprazole (PROTONIX) 40 MG tablet Take 1 tablet (40 mg total) by mouth daily. 90 tablet 0   sildenafil (VIAGRA) 100 MG tablet TAKE 1 TABLET BY MOUTH EVERY DAY AS NEEDED FOR ERECTILE DYSFUNCTION 4 tablet 59   venlafaxine XR (EFFEXOR-XR) 150 MG 24 hr capsule TAKE 1 CAPSULE BY MOUTH EVERY DAY IN THE MORNING. 90 capsule 0   No current facility-administered medications on file prior to visit.   No Known Allergies Social History   Socioeconomic History   Marital status: Married    Spouse name: Not on file   Number of children: Not on file   Years of education: Not on file   Highest education level: Not on file  Occupational History   Not on file  Tobacco Use   Smoking status: Every Day    Packs/day: 1.00    Years: 35.00    Additional pack years: 0.00    Total pack years: 35.00    Types: Cigarettes   Smokeless tobacco: Never   Tobacco comments:    1.5 ppd since age 60.   Vaping Use   Vaping Use: Never used  Substance and Sexual Activity   Alcohol use: Yes    Alcohol/week: 48.0 standard drinks of alcohol    Types: 48 Cans of beer per week    Comment: patient states "about 5 or 6 a day" (beers)   Drug use: No   Sexual activity: Yes  Other Topics Concern   Not on file  Social History Narrative   Not on file   Social Determinants of Health   Financial Resource Strain: Not on file  Food Insecurity: No Food Insecurity (10/14/2022)   Hunger Vital Sign    Worried About Running Out of Food in the Last Year: Never true    Ran Out of Food in the Last Year: Never true  Transportation Needs: No Transportation Needs (10/14/2022)   PRAPARE - Hydrologist (Medical): No    Lack of Transportation (Non-Medical): No  Physical Activity:  Insufficiently Active (10/14/2022)   Exercise Vital Sign    Days of Exercise per Week: 3 days    Minutes of Exercise per Session: 10 min  Stress: No Stress Concern Present (10/14/2022)   [place]    Feeling of Stress : Only a little  Social Connections: Unknown (10/14/2022)   Social Connection and Isolation Panel [NHANES]    Frequency of Communication with Friends and Family: Not on file    Frequency of Social Gatherings  with Friends and Family: Not on file    Attends Religious Services: Never    Active Member of Clubs or Organizations: Not on file    Attends Archivist Meetings: Not on file    Marital Status: Not on file  Intimate Partner Violence: Not At Risk (10/14/2022)   Humiliation, Afraid, Rape, and Kick questionnaire    Fear of Current or Ex-Partner: No    Emotionally Abused: No    Physically Abused: No    Sexually Abused: No      Review of Systems  All other systems reviewed and are negative.      Objective:   Physical Exam Vitals reviewed.  Constitutional:      Appearance: He is well-developed.  Eyes:     Conjunctiva/sclera: Conjunctivae normal.     Pupils: Pupils are equal, round, and reactive to light.  Cardiovascular:     Rate and Rhythm: Normal rate and regular rhythm.     Heart sounds: Normal heart sounds. No murmur heard. Pulmonary:     Effort: Pulmonary effort is normal. No respiratory distress.     Breath sounds: Normal breath sounds. No wheezing or rales.  Abdominal:     General: Bowel sounds are normal. There is no distension.     Palpations: Abdomen is soft.     Tenderness: There is no abdominal tenderness. There is no guarding or rebound.  Musculoskeletal:     Cervical back: Neck supple.  Lymphadenopathy:     Cervical: No cervical adenopathy.  Neurological:     Mental Status: He is alert and oriented to person, place, and time.     Cranial Nerves: No cranial nerve  deficit.     Motor: No abnormal muscle tone.     Coordination: Coordination normal.     Deep Tendon Reflexes: Reflexes are normal and symmetric.           Assessment & Plan:   GAD (generalized anxiety disorder)  Encounter for Medicare annual wellness exam - Plan: CBC with Differential/Platelet, COMPLETE METABOLIC PANEL WITH GFR, Lipid panel, PSA  Pure hypercholesterolemia - Plan: CBC with Differential/Platelet, COMPLETE METABOLIC PANEL WITH GFR, Lipid panel  Benign essential HTN - Plan: CBC with Differential/Platelet, COMPLETE METABOLIC PANEL WITH GFR, Lipid panel  Prostate cancer screening - Plan: PSA  Smoker - Plan: CT CHEST LUNG CA SCREEN LOW DOSE W/O CM  Obstructive sleep apnea syndrome - Plan: Ambulatory referral to ENT I am very happy with his blood pressure.  Strongly recommended smoking cessation.  With regards to his cancer screening recommended a CT scan of the lungs to screen for lung cancer.  Recommended that he contact his GI doctor to determine if they want him to have his colonoscopy this year as it has been 5 years since his last colonoscopy and he did have a tubular adenoma.  Check a CBC, CMP, and a lipid panel.  Ideally I like his LDL cholesterol to be below 100.  Recommended a PSA for prostate cancer screening.  The patient received Prevnar 20 today.  Recommended Shingrix.  Also discussed a tetanus shot and a COVID booster with the patient.

## 2023-02-10 LAB — LIPID PANEL
Cholesterol: 168 mg/dL (ref <200)
HDL: 49 mg/dL (ref >=40)
LDL Cholesterol (Calc): 96 mg/dL (calc)
Non-HDL Cholesterol (Calc): 119 mg/dL (calc) (ref <130)
Total CHOL/HDL Ratio: 3.4 (calc) (ref <5.0)
Triglycerides: 129 mg/dL (ref <150)

## 2023-02-10 LAB — CBC WITH DIFFERENTIAL/PLATELET
Absolute Monocytes: 1680 cells/uL — ABNORMAL HIGH (ref 200–950)
Basophils Absolute: 134 cells/uL (ref 0–200)
Basophils Relative: 1.2 %
Eosinophils Absolute: 370 cells/uL (ref 15–500)
Eosinophils Relative: 3.3 %
HCT: 35.8 % — ABNORMAL LOW (ref 38.5–50.0)
Hemoglobin: 10.1 g/dL — ABNORMAL LOW (ref 13.2–17.1)
Lymphs Abs: 2027 cells/uL (ref 850–3900)
MCH: 20.2 pg — ABNORMAL LOW (ref 27.0–33.0)
MCHC: 28.2 g/dL — ABNORMAL LOW (ref 32.0–36.0)
MCV: 71.7 fL — ABNORMAL LOW (ref 80.0–100.0)
MPV: 9.4 fL (ref 7.5–12.5)
Monocytes Relative: 15 %
Neutro Abs: 6989 cells/uL (ref 1500–7800)
Neutrophils Relative %: 62.4 %
Platelets: 396 10*3/uL (ref 140–400)
RBC: 4.99 10*6/uL (ref 4.20–5.80)
RDW: 17.8 % — ABNORMAL HIGH (ref 11.0–15.0)
Total Lymphocyte: 18.1 %
WBC: 11.2 10*3/uL — ABNORMAL HIGH (ref 3.8–10.8)

## 2023-02-10 LAB — COMPLETE METABOLIC PANEL WITH GFR
AG Ratio: 1.4 (calc) (ref 1.0–2.5)
ALT: 62 U/L — ABNORMAL HIGH (ref 9–46)
AST: 45 U/L — ABNORMAL HIGH (ref 10–35)
Albumin: 4.2 g/dL (ref 3.6–5.1)
Alkaline phosphatase (APISO): 84 U/L (ref 35–144)
BUN: 14 mg/dL (ref 7–25)
CO2: 28 mmol/L (ref 20–32)
Calcium: 9.4 mg/dL (ref 8.6–10.3)
Chloride: 104 mmol/L (ref 98–110)
Creat: 0.84 mg/dL (ref 0.70–1.30)
Globulin: 3 g/dL (calc) (ref 1.9–3.7)
Glucose, Bld: 118 mg/dL — ABNORMAL HIGH (ref 65–99)
Potassium: 4.7 mmol/L (ref 3.5–5.3)
Sodium: 141 mmol/L (ref 135–146)
Total Bilirubin: 0.3 mg/dL (ref 0.2–1.2)
Total Protein: 7.2 g/dL (ref 6.1–8.1)
eGFR: 101 mL/min/{1.73_m2} (ref >=60)

## 2023-02-10 LAB — PSA: PSA: 2.9 ng/mL (ref <=4.00)

## 2023-02-17 ENCOUNTER — Encounter: Payer: Self-pay | Admitting: Family Medicine

## 2023-02-19 ENCOUNTER — Other Ambulatory Visit: Payer: Self-pay

## 2023-02-19 DIAGNOSIS — L719 Rosacea, unspecified: Secondary | ICD-10-CM

## 2023-02-22 ENCOUNTER — Other Ambulatory Visit: Payer: Self-pay | Admitting: Family Medicine

## 2023-02-22 DIAGNOSIS — K271 Acute peptic ulcer, site unspecified, with perforation: Secondary | ICD-10-CM

## 2023-02-23 ENCOUNTER — Encounter: Payer: Self-pay | Admitting: Family Medicine

## 2023-02-23 ENCOUNTER — Other Ambulatory Visit: Payer: Self-pay | Admitting: Family Medicine

## 2023-02-23 ENCOUNTER — Other Ambulatory Visit: Payer: PPO

## 2023-02-23 DIAGNOSIS — R195 Other fecal abnormalities: Secondary | ICD-10-CM | POA: Diagnosis not present

## 2023-02-23 DIAGNOSIS — R7989 Other specified abnormal findings of blood chemistry: Secondary | ICD-10-CM | POA: Diagnosis not present

## 2023-02-23 DIAGNOSIS — F411 Generalized anxiety disorder: Secondary | ICD-10-CM

## 2023-02-23 NOTE — Telephone Encounter (Signed)
Prescription Request  02/23/2023  LOV: 02/09/2023  What is the name of the medication or equipment? Losartan potassium 50 mg tab  Have you contacted your pharmacy to request a refill? Yes   Which pharmacy would you like this sent to?  [id number] [place] [place] [address] [place] [address] Phone: [phone] Fax: [phone]    Patient notified that their request is being sent to the clinical staff for review and that they should receive a response within 2 business days.   Please advise at [facility] [phone]

## 2023-02-23 NOTE — Telephone Encounter (Signed)
Prescription Request  02/23/2023  LOV: 02/09/2023  What is the name of the medication or equipment? venlafaxine XR (EFFEXOR-XR) 150 MG 24 hr capsule   Have you contacted your pharmacy to request a refill? Yes   Which pharmacy would you like this sent to?  [id number] [place], [place] [address] [place] [address] Phone: [phone] Fax: [phone]    Patient notified that their request is being sent to the clinical staff for review and that they should receive a response within 2 business days.   Please advise at [facility] [phone]

## 2023-02-24 LAB — IRON,TIBC AND FERRITIN PANEL
%SAT: 3 % (calc) — ABNORMAL LOW (ref 20–48)
Ferritin: 20 ng/mL — ABNORMAL LOW (ref 38–380)
Iron: 16 ug/dL — ABNORMAL LOW (ref 50–180)
TIBC: 516 mcg/dL (calc) — ABNORMAL HIGH (ref 250–425)

## 2023-02-24 LAB — B12 AND FOLATE PANEL
Folate: 21.2 ng/mL
Vitamin B-12: 441 pg/mL (ref 200–1100)

## 2023-02-24 MED ORDER — LOSARTAN POTASSIUM 50 MG PO TABS: 50.0000 mg | ORAL_TABLET | Freq: Every day | ORAL | 3 refills | Status: DC

## 2023-02-24 MED ORDER — VENLAFAXINE HCL ER 150 MG PO CP24
ORAL_CAPSULE | ORAL | 0 refills | Status: DC
Start: 2023-02-24 — End: 2023-05-01

## 2023-02-24 NOTE — Telephone Encounter (Signed)
OV 02/09/23 Requested Prescriptions  Pending Prescriptions Disp Refills   losartan (COZAAR) 50 MG tablet 90 tablet 3    Sig: Take 1 tablet (50 mg total) by mouth daily.     Cardiovascular:  Angiotensin Receptor Blockers Failed - 02/23/2023 12:52 PM      Failed - Valid encounter within last 6 months    Recent Outpatient Visits           11 months ago Onychomycosis   [place] Pickard, Cammie Mcgee, MD   1 year ago Syncope, unspecified syncope type   [place] Pickard, Cammie Mcgee, MD   1 year ago Chest pain, unspecified type   [place] Susy Frizzle, MD   1 year ago Flank pain   [place] Susy Frizzle, MD   2 years ago Benign essential HTN   [place] Pickard, Cammie Mcgee, MD              Passed - Cr in normal range and within 180 days    Creat  Date Value Ref Range Status  02/09/2023 0.84 0.70 - 1.30 mg/dL Final         Passed - K in normal range and within 180 days    Potassium  Date Value Ref Range Status  02/09/2023 4.7 3.5 - 5.3 mmol/L Final         Passed - Patient is not pregnant      Passed - Last BP in normal range    BP Readings from Last 1 Encounters:  02/09/23 132/76          venlafaxine XR (EFFEXOR-XR) 150 MG 24 hr capsule 90 capsule 0    Sig: TAKE 1 CAPSULE BY MOUTH EVERY DAY IN THE MORNING.     Psychiatry: Antidepressants - SNRI - desvenlafaxine & venlafaxine Failed - 02/23/2023 12:52 PM      Failed - Valid encounter within last 6 months    Recent Outpatient Visits           11 months ago Onychomycosis   [place] Pickard, Cammie Mcgee, MD   1 year ago Syncope, unspecified syncope type   [place] Pickard, Cammie Mcgee, MD   1 year ago Chest pain, unspecified type   [place] Dennard Schaumann Cammie Mcgee, MD   1 year ago Flank pain   [place] Susy Frizzle, MD   2 years ago Benign  essential HTN   [place] Susy Frizzle, MD              Failed - Lipid Panel in normal range within the last 12 months    Cholesterol  Date Value Ref Range Status  02/09/2023 168 <200 mg/dL Final   LDL Cholesterol (Calc)  Date Value Ref Range Status  02/09/2023 96 mg/dL (calc) Final    Comment:    Reference range: <100 . Desirable range <100 mg/dL for primary prevention;   <70 mg/dL for patients with CHD or diabetic patients  with > or = 2 CHD risk factors. Marland Kitchen LDL-C is now calculated using the Martin-Hopkins  calculation, which is a validated novel method providing  better accuracy than the Friedewald equation in the  estimation of LDL-C.  Cresenciano Genre et al. Annamaria Helling. [id number]): 2061-2068  (http://education.[employer].com/faq/FAQ164)    HDL  Date Value Ref Range Status  02/09/2023 49 > OR = 40 mg/dL Final  Triglycerides  Date Value Ref Range Status  02/09/2023 129 <150 mg/dL Final         Passed - Cr in normal range and within 360 days    Creat  Date Value Ref Range Status  02/09/2023 0.84 0.70 - 1.30 mg/dL Final         Passed - Last BP in normal range    BP Readings from Last 1 Encounters:  02/09/23 132/76

## 2023-03-01 ENCOUNTER — Other Ambulatory Visit: Payer: Self-pay | Admitting: Family Medicine

## 2023-03-02 NOTE — Telephone Encounter (Signed)
Prescription Request  03/02/2023  LOV: 02/09/2023  What is the name of the medication or equipment? amLODipine (NORVASC) 10 MG tablet [[id number]  Have you contacted your pharmacy to request a refill? Yes   Which pharmacy would you like this sent to?  [id number] [place], [place] - [address] AT [facility] [address] [place] [address] Phone: [phone] Fax: [phone]    Patient notified that their request is being sent to the clinical staff for review and that they should receive a response within 2 business days.   Please advise at [facility] [phone]

## 2023-03-04 ENCOUNTER — Other Ambulatory Visit: Payer: Self-pay

## 2023-03-04 DIAGNOSIS — I10 Essential (primary) hypertension: Secondary | ICD-10-CM

## 2023-03-04 NOTE — Telephone Encounter (Signed)
Prescription Request  03/04/2023  LOV: 02/23/23  What is the name of the medication or equipment? amLODipine (NORVASC) 10 MG tablet [[id number]]  Have you contacted your pharmacy to request a refill? Yes   Which pharmacy would you like this sent to?  [id number] [place], [place] - [address] AT [facility] [address] [place] [address] Phone: [phone] Fax: [phone]    Patient notified that their request is being sent to the clinical staff for review and that they should receive a response within 2 business days.   Please advise at [facility] [phone]

## 2023-03-05 MED ORDER — AMLODIPINE BESYLATE 10 MG PO TABS
10.0000 mg | ORAL_TABLET | Freq: Every day | ORAL | 0 refills | Status: DC
Start: 2023-03-05 — End: 2023-06-08

## 2023-03-05 NOTE — Telephone Encounter (Signed)
Requested Prescriptions  Pending Prescriptions Disp Refills   amLODipine (NORVASC) 10 MG tablet 90 tablet 0    Sig: Take 1 tablet (10 mg total) by mouth daily.     Cardiovascular: Calcium Channel Blockers 2 Failed - 03/04/2023  5:51 PM      Failed - Valid encounter within last 6 months    Recent Outpatient Visits           12 months ago Onychomycosis   [facility] Pickard, Priscille Heidelberg, MD   1 year ago Syncope, unspecified syncope type   [facility] Pickard, Priscille Heidelberg, MD   1 year ago Chest pain, unspecified type   [facility] Tanya Nones Priscille Heidelberg, MD   1 year ago Flank pain   [facility] Donita Brooks, MD   2 years ago Benign essential HTN   [facility] Pickard, Priscille Heidelberg, MD              Passed - Last BP in normal range    BP Readings from Last 1 Encounters:  02/09/23 132/76         Passed - Last Heart Rate in normal range    Pulse Readings from Last 1 Encounters:  02/09/23 78

## 2023-03-09 ENCOUNTER — Encounter: Payer: Self-pay | Admitting: *Deleted

## 2023-03-09 ENCOUNTER — Ambulatory Visit (INDEPENDENT_AMBULATORY_CARE_PROVIDER_SITE_OTHER): Payer: PPO | Admitting: Gastroenterology

## 2023-03-09 ENCOUNTER — Encounter: Payer: Self-pay | Admitting: Gastroenterology

## 2023-03-09 VITALS — BP 135/81 | HR 71 | Temp 98.1°F | Ht 68.0 in | Wt 230.6 lb

## 2023-03-09 DIAGNOSIS — R7989 Other specified abnormal findings of blood chemistry: Secondary | ICD-10-CM | POA: Diagnosis not present

## 2023-03-09 DIAGNOSIS — D500 Iron deficiency anemia secondary to blood loss (chronic): Secondary | ICD-10-CM | POA: Diagnosis not present

## 2023-03-09 DIAGNOSIS — Z86010 Personal history of colonic polyps: Secondary | ICD-10-CM

## 2023-03-09 DIAGNOSIS — D509 Iron deficiency anemia, unspecified: Secondary | ICD-10-CM | POA: Insufficient documentation

## 2023-03-09 DIAGNOSIS — K625 Hemorrhage of anus and rectum: Secondary | ICD-10-CM | POA: Diagnosis not present

## 2023-03-09 DIAGNOSIS — Z860101 Personal history of adenomatous and serrated colon polyps: Secondary | ICD-10-CM | POA: Insufficient documentation

## 2023-03-09 NOTE — Progress Notes (Signed)
GI Office Note    Referring Provider: Donita Brooks, MD Primary Care Physician:  Donita Brooks, MD  Primary Gastroenterologist: Roetta Sessions, MD   Chief Complaint   Chief Complaint  Patient presents with   Colonoscopy    Having issues with rectal bleeding due to hemorrhoids.     History of Present Illness   [name] is a 57 y.o. male presenting today due for survillance colonoscopy. Last colonoscopy January 2019, history of adenomatous colon polyps at that time.  Patient presents today stating he is having issues with rectal bleeding which she feels is coming from hemorrhoids.  Complains of rectal pain/itching.  He completed hemorrhoid banding with Dr. Jena Gauss 2021.  Patient states initially he did very well without any issues with hemorrhoids up until recently. Reports regular bowel movements.  Denies straining.  No abdominal pain, heartburn, dysphagia.  Patient consumes 5-6 beers per day, and has been on this for years.  Recent labs indicate anemia iron deficiency anemia, B12 and folate normal.  See below for labs.  Notably hemoglobin 15.25 August 2021, recently hemoglobin 10.1.  Transaminases chronically elevated.  Liver imaging available within the last 5 years.  Gastrorrhapy for perforated ulcer in 2017. Partial colectomy appendectomy for perforated diverticulitis and 2016.    Colonoscopy 11/2017: -diverticulosis -nonbleeding hemorrhoids -two 7-8 mm polyps removed from rectosigmoid colon. -one tubular adenoma, next colonoscopy five years   Medications   Current Outpatient Medications  Medication Sig Dispense Refill   amLODipine (NORVASC) 10 MG tablet Take 1 tablet (10 mg total) by mouth daily. 90 tablet 0   atorvastatin (LIPITOR) 40 MG tablet TAKE 1 TABLET BY MOUTH EVERY DAY (Patient taking differently: Take 40 mg by mouth daily.) 90 tablet 1   carvedilol (COREG) 12.5 MG tablet TAKE 1 TABLET (12.5 MG TOTAL) BY MOUTH 2 (TWO) TIMES DAILY WITH A MEAL.  180 tablet 3   clonazePAM (KLONOPIN) 0.5 MG tablet TAKE 1 TABLET (0.5 MG TOTAL) BY MOUTH 3 (THREE) TIMES DAILY AS NEEDED FOR ANXIETY. 30 tablet 0   cyclobenzaprine (FLEXERIL) 10 MG tablet TAKE 1 TABLET BY MOUTH EVERYDAY AT BEDTIME (Patient taking differently: Take 10 mg by mouth at bedtime.) 30 tablet 2   fluticasone (FLONASE) 50 MCG/ACT nasal spray SPRAY 2 SPRAYS INTO EACH NOSTRIL EVERY DAY 48 mL 3   losartan (COZAAR) 50 MG tablet Take 1 tablet (50 mg total) by mouth daily. 90 tablet 3   Menthol (BIOFREEZE) 10.5 % AERO Apply 1-2 sprays topically as needed (pain).     pantoprazole (PROTONIX) 40 MG tablet Take 1 tablet (40 mg total) by mouth daily. 90 tablet 0   sildenafil (VIAGRA) 100 MG tablet TAKE 1 TABLET BY MOUTH EVERY DAY AS NEEDED FOR ERECTILE DYSFUNCTION 4 tablet 59   venlafaxine XR (EFFEXOR-XR) 150 MG 24 hr capsule TAKE 1 CAPSULE BY MOUTH EVERY DAY IN THE MORNING. 90 capsule 0   No current facility-administered medications for this visit.    Allergies   Allergies as of 03/09/2023   (No Known Allergies)    Past Medical History   Past Medical History:  Diagnosis Date   Allergy    Anemia    Colonic diverticular abscess 12/26/2014   Diverticulitis    ETOH abuse    GAD (generalized anxiety disorder)    GERD (gastroesophageal reflux disease)    Hypercholesteremia    Hypertension    Left rotator cuff tear 02/08/2019   Superior labrum anterior-to-posterior (SLAP) tear of left shoulder  02/08/2019   Tobacco abuse     Past Surgical History   Past Surgical History:  Procedure Laterality Date   APPENDECTOMY N/A 01/01/2015   Procedure: INCIDENTAL APPENDECTOMY;  Surgeon: Dalia Heading, MD;  Location: [facility];  Service: General;  Laterality: N/A;   BACK SURGERY     lower back, fusion   COLONOSCOPY WITH PROPOFOL N/A 12/21/2017   Procedure: COLONOSCOPY WITH PROPOFOL;  Surgeon: Corbin Ade, MD;  Location: [facility];  Service: Endoscopy;  Laterality: N/A;  9:45am   GASTRECTOMY  N/A 04/14/2016   Procedure: GASTRORRHAPHY;  Surgeon: Franky Macho, MD;  Location: [facility];  Service: General;  Laterality: N/A;   HEMORRHOID SURGERY     HERNIA REPAIR     as child   LOOP RECORDER INSERTION N/A 09/06/2021   Procedure: LOOP RECORDER INSERTION;  Surgeon: Thurmon Fair, MD;  Location: [facility];  Service: Cardiovascular;  Laterality: N/A;   PARTIAL COLECTOMY N/A 01/01/2015   Procedure: PARTIAL COLECTOMY;  Surgeon: Dalia Heading, MD;  Location: [facility];  Service: General;  Laterality: N/A;   POLYPECTOMY  12/21/2017   Procedure: POLYPECTOMY;  Surgeon: Corbin Ade, MD;  Location: [facility];  Service: Endoscopy;;  colon   ROTATOR CUFF REPAIR Bilateral    SHOULDER ARTHROSCOPY WITH ROTATOR CUFF REPAIR AND SUBACROMIAL DECOMPRESSION Left 02/09/2019   Procedure: SHOULDER ARTHROSCOPY WITH ROTATOR CUFF REPAIR AND SUBACROMIAL PARTIAL ACROMIOPLASTY, DISTAL CLAVICULECTOMY, AND EXTENSIVE DEBRIDEMENT;  Surgeon: Salvatore Marvel, MD;  Location: [facility];  Service: Orthopedics;  Laterality: Left;    Past Family History   Family History  Problem Relation Age of Onset   Diabetes Mother    Stroke Mother    Stroke Father    CAD Father    Colon cancer Neg Hx     Past Social History   Social History   Socioeconomic History   Marital status: Married    Spouse name: Not on file   Number of children: Not on file   Years of education: Not on file   Highest education level: Not on file  Occupational History   Not on file  Tobacco Use   Smoking status: Every Day    Packs/day: 1.00    Years: 35.00    Additional pack years: 0.00    Total pack years: 35.00    Types: Cigarettes   Smokeless tobacco: Never   Tobacco comments:    1.5 ppd since age 59.   Vaping Use   Vaping Use: Never used  Substance and Sexual Activity   Alcohol use: Yes    Alcohol/week: 48.0 standard drinks of alcohol    Types: 48 Cans of beer per week    Comment: patient states "about 5 or  6 a day" (beers)   Drug use: No   Sexual activity: Yes  Other Topics Concern   Not on file  Social History Narrative   Not on file   Social Determinants of Health   Financial Resource Strain: Not on file  Food Insecurity: No Food Insecurity (10/14/2022)   Hunger Vital Sign    Worried About Running Out of Food in the Last Year: Never true    Ran Out of Food in the Last Year: Never true  Transportation Needs: No Transportation Needs (10/14/2022)   PRAPARE - Administrator, Civil Service (Medical): No    Lack of Transportation (Non-Medical): No  Physical Activity: Insufficiently Active (10/14/2022)   Exercise Vital Sign  Days of Exercise per Week: 3 days    Minutes of Exercise per Session: 10 min  Stress: No Stress Concern Present (10/14/2022)   [employer] of Occupational Health - Occupational Stress Questionnaire    Feeling of Stress : Only a little  Social Connections: Unknown (10/14/2022)   Social Connection and Isolation Panel [NHANES]    Frequency of Communication with Friends and Family: Not on file    Frequency of Social Gatherings with Friends and Family: Not on file    Attends Religious Services: Never    Active Member of Clubs or Organizations: Not on file    Attends Banker Meetings: Not on file    Marital Status: Not on file  Intimate Partner Violence: Not At Risk (10/14/2022)   Humiliation, Afraid, Rape, and Kick questionnaire    Fear of Current or Ex-Partner: No    Emotionally Abused: No    Physically Abused: No    Sexually Abused: No    Review of Systems   General: Negative for anorexia, weight loss, fever, chills, fatigue, weakness. Eyes: Negative for vision changes.  ENT: Negative for hoarseness, difficulty swallowing , nasal congestion. CV: Negative for chest pain, angina, palpitations, dyspnea on exertion, peripheral edema.  Respiratory: Negative for dyspnea at rest, dyspnea on exertion, cough, sputum, wheezing.  GI:  See history of present illness. GU:  Negative for dysuria, hematuria, urinary incontinence, urinary frequency, nocturnal urination.  MS: Negative for joint pain, low back pain.  Derm: Negative for rash or itching.  Neuro: Negative for weakness, abnormal sensation, seizure, frequent headaches, memory loss,  confusion.  Psych: Negative for anxiety, depression, suicidal ideation, hallucinations.  Endo: Negative for unusual weight change.  Heme: Negative for bruising or bleeding. Allergy: Negative for rash or hives.  Physical Exam   BP 135/81 (BP Location: Right Arm, Patient Position: Sitting, Cuff Size: Large)   Pulse 71   Temp 98.1 F (36.7 C) (Oral)   Ht  (1.727 m)   Wt 230 lb 9.6 oz (104.6 kg)   SpO2 93%   BMI 35.06 kg/m    General: Well-nourished, well-developed in no acute distress.  Head: Normocephalic, atraumatic.   Eyes: Conjunctiva pink, no icterus. Mouth: Oropharyngeal mucosa moist and pink   Neck: Supple without thyromegaly, masses, or lymphadenopathy.  Lungs: Clear to auscultation bilaterally.  Heart: Regular rate and rhythm, no murmurs rubs or gallops.  Abdomen: Bowel sounds are normal, nontender, nondistended, no hepatosplenomegaly or masses,  no abdominal bruits or hernia, no rebound or guarding.   Rectal: not performed Extremities: No lower extremity edema. No clubbing or deformities.  Neuro: Alert and oriented x 4 , grossly normal neurologically.  Skin: Warm and dry, no rash or jaundice.   Psych: Alert and cooperative, normal mood and affect.  Labs   Lab Results  Component Value Date   VITAMINB12 441 02/23/2023   Lab Results  Component Value Date   FOLATE 21.2 02/23/2023   Lab Results  Component Value Date   IRON 16 (L) 02/23/2023   TIBC 516 (H) 02/23/2023   FERRITIN 20 (L) 02/23/2023   Lab Results  Component Value Date   WBC 11.2 (H) 02/09/2023   HGB 10.1 (L) 02/09/2023   HCT 35.8 (L) 02/09/2023   MCV 71.7 (L) 02/09/2023   PLT 396  02/09/2023   Lab Results  Component Value Date   CREATININE 0.84 02/09/2023   BUN 14 02/09/2023   NA 141 02/09/2023   K 4.7 02/09/2023   CL 104  02/09/2023   CO2 28 02/09/2023   Lab Results  Component Value Date   ALT 62 (H) 02/09/2023   AST 45 (H) 02/09/2023   ALKPHOS 99 06/24/2022   BILITOT 0.3 02/09/2023    Imaging Studies   No results found.  Assessment   H/O adenomatous colon polyps: Due for surveillance colonoscopy at this time.  Rectal bleeding: History of hemorrhoids status post banding as outlined.  Patient complains of recurrent rectal pain/itching and bleeding, possibly due to hemorrhoids.  Recommend further evaluation via colonoscopy.   IDA: Recent diagnosis of microcytic anemia, anemia labs consistent with IDA.  Patient reports intermittent rectal bleeding which she feels is from hemorrhoids.  He is due for surveillance colonoscopy given history of adenomatous colon polyps evaluate IDA at the same time.  If colonoscopy is unremarkable, plan for upper endoscopy at the same time especially given history of prior complicated peptic ulcer disease.  Will screen for celiac disease as well.  Elevated AST/AL: Chronic transaminitis dating back for several years.  AST to ALT ratio not consistent with alcoholic hepatitis.  May be multifactorial in the setting of alcohol use, statin use, fatty liver.  Recommend further evaluation.  H/o etoh abuse: recommend cutting back on daily etoh use. Initial goal of no more than 3 servings per day.   PLAN   Colonoscopy with possible EGD with Dr. Jena Gauss. ASA 3.  I have discussed the risks, alternatives, benefits with regards to but not limited to the risk of reaction to medication, bleeding, infection, perforation and the patient is agreeable to proceed. Written consent to be obtained. Abd u/s. Labs to evaluate abnormal LFTs.   Leanna Battles. Melvyn Neth, MHS, PA-C [facility]

## 2023-03-09 NOTE — Patient Instructions (Signed)
Please complete labs, okay to wait until 5/3 when you have Dr. Caren Macadam labs done. BE SURE TO TAKE OUR LAB ORDERS WITH YOU. Colonoscopy with possible upper endoscopy to be scheduled.  Abdominal ultrasound to be scheduled to evaluate your elevated liver labs. Try cutting back on alcohol use. First goal of no more than recommended daily consumption of three 12 ounce beers daily.

## 2023-03-18 ENCOUNTER — Other Ambulatory Visit: Payer: Self-pay | Admitting: Family Medicine

## 2023-03-18 DIAGNOSIS — K271 Acute peptic ulcer, site unspecified, with perforation: Secondary | ICD-10-CM

## 2023-03-19 ENCOUNTER — Other Ambulatory Visit: Payer: Self-pay

## 2023-03-19 DIAGNOSIS — R000 Tachycardia, unspecified: Secondary | ICD-10-CM

## 2023-03-19 NOTE — Telephone Encounter (Signed)
Unable to refill per protocol, appointment needed.   Requested Prescriptions  Pending Prescriptions Disp Refills   carvedilol (COREG) 12.5 MG tablet 180 tablet 3    Sig: Take 1 tablet (12.5 mg total) by mouth 2 (two) times daily with a meal.     Cardiovascular: Beta Blockers 3 Failed - 03/19/2023 10:10 AM      Failed - AST in normal range and within 360 days    AST  Date Value Ref Range Status  02/09/2023 45 (H) 10 - 35 U/L Final         Failed - ALT in normal range and within 360 days    ALT  Date Value Ref Range Status  02/09/2023 62 (H) 9 - 46 U/L Final         Failed - Valid encounter within last 6 months    Recent Outpatient Visits           1 year ago Onychomycosis   [facility] Pickard, Priscille Heidelberg, MD   1 year ago Syncope, unspecified syncope type   [facility] Pickard, Priscille Heidelberg, MD   1 year ago Chest pain, unspecified type   [facility] Pickard, Priscille Heidelberg, MD   1 year ago Flank pain   [facility] Donita Brooks, MD   2 years ago Benign essential HTN   [facility] Pickard, Priscille Heidelberg, MD              Passed - Cr in normal range and within 360 days    Creat  Date Value Ref Range Status  02/09/2023 0.84 0.70 - 1.30 mg/dL Final         Passed - Last BP in normal range    BP Readings from Last 1 Encounters:  03/09/23 135/81         Passed - Last Heart Rate in normal range    Pulse Readings from Last 1 Encounters:  03/09/23 71

## 2023-03-19 NOTE — Telephone Encounter (Signed)
Prescription Request  03/19/2023  LOV: 02/09/23 CPE  What is the name of the medication or equipment? carvedilol (COREG) 12.5 MG tablet [[id number]]  Have you contacted your pharmacy to request a refill? Yes   Which pharmacy would you like this sent to?  [id number] - [place], [place] - [address] AT [facility] [address] [place] [address] Phone: [phone] Fax: [phone]    Patient notified that their request is being sent to the clinical staff for review and that they should receive a response within 2 business days.   Please advise at [facility] [phone]

## 2023-03-20 ENCOUNTER — Other Ambulatory Visit: Payer: Self-pay

## 2023-03-20 ENCOUNTER — Ambulatory Visit (HOSPITAL_COMMUNITY): Payer: PPO

## 2023-03-20 DIAGNOSIS — R000 Tachycardia, unspecified: Secondary | ICD-10-CM

## 2023-03-20 MED ORDER — CARVEDILOL 12.5 MG PO TABS
12.5000 mg | ORAL_TABLET | Freq: Two times a day (BID) | ORAL | 3 refills | Status: DC
Start: 2023-03-20 — End: 2023-12-21

## 2023-03-26 ENCOUNTER — Encounter: Payer: Self-pay | Admitting: Family Medicine

## 2023-03-27 ENCOUNTER — Ambulatory Visit: Payer: PPO | Admitting: Family Medicine

## 2023-03-27 ENCOUNTER — Other Ambulatory Visit: Payer: PPO

## 2023-03-27 ENCOUNTER — Encounter: Payer: Self-pay | Admitting: Family Medicine

## 2023-03-27 DIAGNOSIS — E611 Iron deficiency: Secondary | ICD-10-CM | POA: Diagnosis not present

## 2023-03-27 LAB — CBC WITH DIFFERENTIAL/PLATELET
Absolute Monocytes: 1327 cells/uL — ABNORMAL HIGH (ref 200–950)
Basophils Relative: 1.4 %
Eosinophils Relative: 3.8 %
HCT: 42.3 % (ref 38.5–50.0)
MCH: 23.9 pg — ABNORMAL LOW (ref 27.0–33.0)
Monocytes Relative: 16.8 %

## 2023-03-28 LAB — CBC WITH DIFFERENTIAL/PLATELET
Basophils Absolute: 111 cells/uL (ref 0–200)
Eosinophils Absolute: 300 cells/uL (ref 15–500)
Hemoglobin: 12.7 g/dL — ABNORMAL LOW (ref 13.2–17.1)
Lymphs Abs: 1880 cells/uL (ref 850–3900)
MCHC: 30 g/dL — ABNORMAL LOW (ref 32.0–36.0)
MCV: 79.5 fL — ABNORMAL LOW (ref 80.0–100.0)
MPV: 10.3 fL (ref 7.5–12.5)
Neutro Abs: 4282 cells/uL (ref 1500–7800)
Neutrophils Relative %: 54.2 %
Platelets: 355 10*3/uL (ref 140–400)
RBC: 5.32 10*6/uL (ref 4.20–5.80)
Total Lymphocyte: 23.8 %
WBC: 7.9 10*3/uL (ref 3.8–10.8)

## 2023-03-28 LAB — IRON,TIBC AND FERRITIN PANEL
%SAT: 81 % (calc) — ABNORMAL HIGH (ref 20–48)
Ferritin: 107 ng/mL (ref 38–380)
Iron: 389 ug/dL — ABNORMAL HIGH (ref 50–180)
TIBC: 480 mcg/dL (calc) — ABNORMAL HIGH (ref 250–425)

## 2023-03-30 ENCOUNTER — Other Ambulatory Visit: Payer: PPO

## 2023-04-06 ENCOUNTER — Other Ambulatory Visit: Payer: PPO

## 2023-04-06 ENCOUNTER — Other Ambulatory Visit: Payer: Self-pay

## 2023-04-06 DIAGNOSIS — D509 Iron deficiency anemia, unspecified: Secondary | ICD-10-CM

## 2023-04-06 DIAGNOSIS — K625 Hemorrhage of anus and rectum: Secondary | ICD-10-CM

## 2023-04-06 DIAGNOSIS — R7989 Other specified abnormal findings of blood chemistry: Secondary | ICD-10-CM

## 2023-04-07 ENCOUNTER — Other Ambulatory Visit: Payer: Self-pay | Admitting: Family Medicine

## 2023-04-07 LAB — HEPATITIS C ANTIBODY: Hepatitis C Ab: REACTIVE — AB

## 2023-04-07 LAB — ANA, IFA COMPREHENSIVE PANEL
ENA SM Ab Ser-aCnc: <1 AI
ds DNA Ab: 7 IU/mL — ABNORMAL HIGH

## 2023-04-07 NOTE — Telephone Encounter (Signed)
Requested medication (s) are due for refill today: yes  Requested medication (s) are on the active medication list: yes  Last refill:  03/03/23 #30/0  Future visit scheduled: no  Notes to clinic:  Unable to refill per protocol, cannot delegate. CPE 01/2023    Requested Prescriptions  Pending Prescriptions Disp Refills   clonazePAM (KLONOPIN) 0.5 MG tablet [Pharmacy Med Name: CLONAZEPAM 0.5 MG TABLET] 30 tablet 0    Sig: TAKE 1 TABLET (0.5 MG TOTAL) BY MOUTH 3 (THREE) TIMES DAILY AS NEEDED FOR ANXIETY.     Not Delegated - Psychiatry: Anxiolytics/Hypnotics 2 Failed - 04/07/2023  5:43 PM      Failed - This refill cannot be delegated      Failed - Urine Drug Screen completed in last 360 days      Failed - Valid encounter within last 6 months    Recent Outpatient Visits           1 year ago Onychomycosis   [facility] Tanya Nones Priscille Heidelberg, MD   1 year ago Syncope, unspecified syncope type   [facility] Tanya Nones Priscille Heidelberg, MD   1 year ago Chest pain, unspecified type   [facility] Donita Brooks, MD   1 year ago Flank pain   [facility] Donita Brooks, MD   2 years ago Benign essential HTN   [facility] Pickard, Priscille Heidelberg, MD       Future Appointments             In 1 month Terri Piedra, DO [facility]            Passed - Patient is not pregnant

## 2023-04-08 LAB — HEPATITIS B SURFACE ANTIGEN: Hepatitis B Surface Ag: NONREACTIVE

## 2023-04-08 LAB — ANTI-NUCLEAR AB-TITER (ANA TITER): ANA TITER: 1:40 {titer} — ABNORMAL HIGH

## 2023-04-08 LAB — ANA, IFA COMPREHENSIVE PANEL
ENA SM Ab Ser-aCnc: <1 AI
ds DNA Ab: 7 IU/mL — ABNORMAL HIGH

## 2023-04-08 LAB — TIQ-MISC

## 2023-04-10 LAB — HEPATIC FUNCTION PANEL
ALT: 86 U/L — ABNORMAL HIGH (ref 9–46)
AST: 74 U/L — ABNORMAL HIGH (ref 10–35)
Albumin: 4.2 g/dL (ref 3.6–5.1)
Alkaline phosphatase (APISO): 86 U/L (ref 35–144)
Total Protein: 7.1 g/dL (ref 6.1–8.1)

## 2023-04-10 LAB — HEPATITIS B SURFACE ANTIBODY,QUALITATIVE: Hep B S Ab: NONREACTIVE

## 2023-04-10 LAB — ANA, IFA COMPREHENSIVE PANEL
SM/RNP: <1 AI
SSA (Ro) (ENA) Antibody, IgG: <1 AI
SSB (La) (ENA) Antibody, IgG: <1 AI

## 2023-04-10 NOTE — Patient Instructions (Signed)
Vue Beyler Weiskopf  04/10/2023     @PREFPERIOPPHARMACY @   Your procedure is scheduled on  04/15/2023.   Report to [facility] at  0815  A.M.   Call this number if you have problems the morning of surgery:  [phone]  If you experience any cold or flu symptoms such as cough, fever, chills, shortness of breath, etc. between now and your scheduled surgery, please notify us at the above number.   Remember:  Follow the diet and prep instructions given to you by the office.     Take these medicines the morning of surgery with A SIP OF WATER          amlodipine, carvedilol, clonazepam, pantoprazole, effexor.     Do not wear jewelry, make-up or nail polish.  Do not wear lotions, powders, or perfumes, or deodorant.  Do not shave 48 hours prior to surgery.  Men may shave face and neck.  Do not bring valuables to the hospital.  [facility] is not responsible for any belongings or valuables.  Contacts, dentures or bridgework may not be worn into surgery.  Leave your suitcase in the car.  After surgery it may be brought to your room.  For patients admitted to the hospital, discharge time will be determined by your treatment team.  Patients discharged the day of surgery will not be allowed to drive home and must have someone with them for 24 hours.    Special instructions:   DO NOT smoke tobacco or vape for 24 hours before your procedure.  Please read over the following fact sheets that you were given. Anesthesia Post-op Instructions and Care and Recovery After Surgery      Upper Endoscopy, Adult, Care After After the procedure, it is common to have a sore throat. It is also common to have: Mild stomach pain or discomfort. Bloating. Nausea. Follow these instructions at home: The instructions below may help you care for yourself at home. Your health care provider may give you more instructions. If you have questions, ask your health care provider. If you were given  a sedative during the procedure, it can affect you for several hours. Do not drive or operate machinery until your health care provider says that it is safe. If you will be going home right after the procedure, plan to have a responsible adult: Take you home from the hospital or clinic. You will not be allowed to drive. Care for you for the time you are told. Follow instructions from your health care provider about what you may eat and drink. Return to your normal activities as told by your health care provider. Ask your health care provider what activities are safe for you. Take over-the-counter and prescription medicines only as told by your health care provider. Contact a health care provider if you: Have a sore throat that lasts longer than one day. Have trouble swallowing. Have a fever. Get help right away if you: Vomit blood or your vomit looks like coffee grounds. Have bloody, black, or tarry stools. Have a very bad sore throat or you cannot swallow. Have difficulty breathing or very bad pain in your chest or abdomen. These symptoms may be an emergency. Get help right away. Call 911. Do not wait to see if the symptoms will go away. Do not drive yourself to the hospital. Summary After the procedure, it is common to have a sore throat, mild stomach discomfort, bloating, and nausea.  If you were given a sedative during the procedure, it can affect you for several hours. Do not drive until your health care provider says that it is safe. Follow instructions from your health care provider about what you may eat and drink. Return to your normal activities as told by your health care provider. This information is not intended to replace advice given to you by your health care provider. Make sure you discuss any questions you have with your health care provider. Document Revised: 02/19/2022 Document Reviewed: 02/19/2022 Elsevier Patient Education  2023 Elsevier Inc.  Colonoscopy, Adult,  Care After The following information offers guidance on how to care for yourself after your procedure. Your health care provider may also give you more specific instructions. If you have problems or questions, contact your health care provider. What can I expect after the procedure? After the procedure, it is common to have: A small amount of blood in your stool for 24 hours after the procedure. Some gas. Mild cramping or bloating of your abdomen. Follow these instructions at home: Eating and drinking  Drink enough fluid to keep your urine pale yellow. Follow instructions from your health care provider about eating or drinking restrictions. Resume your normal diet as told by your health care provider. Avoid heavy or fried foods that are hard to digest. Activity Rest as told by your health care provider. Avoid sitting for a long time without moving. Get up to take short walks every 1-2 hours. This is important to improve blood flow and breathing. Ask for help if you feel weak or unsteady. Return to your normal activities as told by your health care provider. Ask your health care provider what activities are safe for you. Managing cramping and bloating  Try walking around when you have cramps or feel bloated. If directed, apply heat to your abdomen as told by your health care provider. Use the heat source that your health care provider recommends, such as a moist heat pack or a heating pad. Place a towel between your skin and the heat source. Leave the heat on for 20-30 minutes. Remove the heat if your skin turns bright red. This is especially important if you are unable to feel pain, heat, or cold. You have a greater risk of getting burned. General instructions If you were given a sedative during the procedure, it can affect you for several hours. Do not drive or operate machinery until your health care provider says that it is safe. For the first 24 hours after the procedure: Do not sign  important documents. Do not drink alcohol. Do your regular daily activities at a slower pace than normal. Eat soft foods that are easy to digest. Take over-the-counter and prescription medicines only as told by your health care provider. Keep all follow-up visits. This is important. Contact a health care provider if: You have blood in your stool 2-3 days after the procedure. Get help right away if: You have more than a small spotting of blood in your stool. You have large blood clots in your stool. You have swelling of your abdomen. You have nausea or vomiting. You have a fever. You have increasing pain in your abdomen that is not relieved with medicine. These symptoms may be an emergency. Get help right away. Call 911. Do not wait to see if the symptoms will go away. Do not drive yourself to the hospital. Summary After the procedure, it is common to have a small amount of blood in your stool.  You may also have mild cramping and bloating of your abdomen. If you were given a sedative during the procedure, it can affect you for several hours. Do not drive or operate machinery until your health care provider says that it is safe. Get help right away if you have a lot of blood in your stool, nausea or vomiting, a fever, or increased pain in your abdomen. This information is not intended to replace advice given to you by your health care provider. Make sure you discuss any questions you have with your health care provider. Document Revised: 07/03/2021 Document Reviewed: 07/03/2021 Elsevier Patient Education  2023 Elsevier Inc. Monitored Anesthesia Care, Care After The following information offers guidance on how to care for yourself after your procedure. Your health care provider may also give you more specific instructions. If you have problems or questions, contact your health care provider. What can I expect after the procedure? After the procedure, it is common to  have: Tiredness. Little or no memory about what happened during or after the procedure. Impaired judgment when it comes to making decisions. Nausea or vomiting. Some trouble with balance. Follow these instructions at home: For the time period you were told by your health care provider:  Rest. Do not participate in activities where you could fall or become injured. Do not drive or use machinery. Do not drink alcohol. Do not take sleeping pills or medicines that cause drowsiness. Do not make important decisions or sign legal documents. Do not take care of children on your own. Medicines Take over-the-counter and prescription medicines only as told by your health care provider. If you were prescribed antibiotics, take them as told by your health care provider. Do not stop using the antibiotic even if you start to feel better. Eating and drinking Follow instructions from your health care provider about what you may eat and drink. Drink enough fluid to keep your urine pale yellow. If you vomit: Drink clear fluids slowly and in small amounts as you are able. Clear fluids include water, ice chips, low-calorie sports drinks, and fruit juice that has water added to it (diluted fruit juice). Eat light and bland foods in small amounts as you are able. These foods include bananas, applesauce, rice, lean meats, toast, and crackers. General instructions  Have a responsible adult stay with you for the time you are told. It is important to have someone help care for you until you are awake and alert. If you have sleep apnea, surgery and some medicines can increase your risk for breathing problems. Follow instructions from your health care provider about wearing your sleep device: When you are sleeping. This includes during daytime naps. While taking prescription pain medicines, sleeping medicines, or medicines that make you drowsy. Do not use any products that contain nicotine or tobacco. These  products include cigarettes, chewing tobacco, and vaping devices, such as e-cigarettes. If you need help quitting, ask your health care provider. Contact a health care provider if: You feel nauseous or vomit every time you eat or drink. You feel light-headed. You are still sleepy or having trouble with balance after 24 hours. You get a rash. You have a fever. You have redness or swelling around the IV site. Get help right away if: You have trouble breathing. You have new confusion after you get home. These symptoms may be an emergency. Get help right away. Call 911. Do not wait to see if the symptoms will go away. Do not drive yourself to the  hospital. This information is not intended to replace advice given to you by your health care provider. Make sure you discuss any questions you have with your health care provider. Document Revised: 04/07/2022 Document Reviewed: 04/07/2022 Elsevier Patient Education  2023 [employer].

## 2023-04-11 LAB — HCV RNA,QUANTITATIVE REAL TIME PCR
HCV Quantitative Log: <1.18 Log IU/mL
HCV RNA, PCR, QN: <15 IU/mL

## 2023-04-11 LAB — ANTI-NUCLEAR AB-TITER (ANA TITER): ANA Titer 1: 1:320 {titer} — ABNORMAL HIGH

## 2023-04-11 LAB — HEPATIC FUNCTION PANEL
AG Ratio: 1.4 (calc) (ref 1.0–2.5)
Bilirubin, Direct: 0.1 mg/dL (ref 0.0–0.2)
Globulin: 2.9 g/dL (calc) (ref 1.9–3.7)
Indirect Bilirubin: 0.1 mg/dL (calc) — ABNORMAL LOW (ref 0.2–1.2)
Total Bilirubin: 0.2 mg/dL (ref 0.2–1.2)

## 2023-04-11 LAB — IGG, IGA, IGM
IgG (Immunoglobin G), Serum: 1011 mg/dL (ref 600–1640)
IgM, Serum: 154 mg/dL (ref 50–300)
Immunoglobulin A: 328 mg/dL — ABNORMAL HIGH (ref 47–310)

## 2023-04-11 LAB — TISSUE TRANSGLUTAMINASE, IGA: (tTG) Ab, IgA: <1 U/mL

## 2023-04-11 LAB — MITOCHONDRIAL ANTIBODIES: Mitochondrial M2 Ab, IgG: <=20 U (ref <=20.0)

## 2023-04-11 LAB — HEPATITIS A ANTIBODY, TOTAL: Hepatitis A AB,Total: NONREACTIVE

## 2023-04-13 ENCOUNTER — Encounter (HOSPITAL_COMMUNITY): Payer: Self-pay | Admitting: Anesthesiology

## 2023-04-13 ENCOUNTER — Encounter (HOSPITAL_COMMUNITY)
Admission: RE | Admit: 2023-04-13 | Discharge: 2023-04-13 | Disposition: A | Discharge status: Home / Self Care | Payer: PPO | Source: Ambulatory Visit | Attending: Internal Medicine | Admitting: Internal Medicine

## 2023-04-13 ENCOUNTER — Encounter (HOSPITAL_COMMUNITY): Payer: Self-pay

## 2023-04-13 VITALS — BP 134/83 | HR 82 | Resp 17 | Ht 68.0 in | Wt 220.0 lb

## 2023-04-13 DIAGNOSIS — Z720 Tobacco use: Secondary | ICD-10-CM

## 2023-04-13 DIAGNOSIS — Z01818 Encounter for other preprocedural examination: Secondary | ICD-10-CM | POA: Diagnosis not present

## 2023-04-13 DIAGNOSIS — F1010 Alcohol abuse, uncomplicated: Secondary | ICD-10-CM | POA: Diagnosis not present

## 2023-04-13 DIAGNOSIS — I10 Essential (primary) hypertension: Secondary | ICD-10-CM | POA: Diagnosis not present

## 2023-04-13 HISTORY — DX: Other specified personal risk factors, not elsewhere classified: Z91.89

## 2023-04-13 HISTORY — DX: Sleep apnea, unspecified: G47.30

## 2023-04-13 LAB — COMPREHENSIVE METABOLIC PANEL
ALT: 113 U/L — ABNORMAL HIGH (ref 0–44)
AST: 99 U/L — ABNORMAL HIGH (ref 15–41)
Albumin: 3.9 g/dL (ref 3.5–5.0)
Alkaline Phosphatase: 74 U/L (ref 38–126)
Anion gap: 12 (ref 5–15)
BUN: 9 mg/dL (ref 6–20)
CO2: 26 mmol/L (ref 22–32)
Calcium: 9.2 mg/dL (ref 8.9–10.3)
Chloride: 97 mmol/L — ABNORMAL LOW (ref 98–111)
Creatinine, Ser: 0.86 mg/dL (ref 0.61–1.24)
GFR, Estimated: >60 mL/min (ref >60)
Glucose, Bld: 160 mg/dL — ABNORMAL HIGH (ref 70–99)
Potassium: 3.7 mmol/L (ref 3.5–5.1)
Sodium: 135 mmol/L (ref 135–145)
Total Bilirubin: 0.5 mg/dL (ref 0.3–1.2)
Total Protein: 7.3 g/dL (ref 6.5–8.1)

## 2023-04-13 NOTE — Progress Notes (Signed)
Procedure will be cancelled for now. Patient is having syncopal episodes and passing out at intervals.  He will need to be done as soon as possible at [facility].  He is not a candidate for [facility] due to his condition.

## 2023-04-14 ENCOUNTER — Telehealth: Payer: Self-pay | Admitting: *Deleted

## 2023-04-14 DIAGNOSIS — D500 Iron deficiency anemia secondary to blood loss (chronic): Secondary | ICD-10-CM

## 2023-04-14 DIAGNOSIS — K625 Hemorrhage of anus and rectum: Secondary | ICD-10-CM

## 2023-04-14 DIAGNOSIS — Z86010 Personal history of colonic polyps: Secondary | ICD-10-CM

## 2023-04-14 NOTE — Telephone Encounter (Signed)
Sent message to [place] to see

## 2023-04-14 NOTE — Telephone Encounter (Signed)
-----   Message from Jethro Bolus, RN sent at 04/13/2023  2:42 PM EDT ----- Regarding: Cancellation     Jethro Bolus, RN Registered Nurse Ambulatory Surgery   Progress Notes    Signed   Date of Service: 04/13/2023  2:15 PM  Signed    Procedure will be cancelled for now. Patient is having syncopal episodes and passing out at intervals.  He will need to be done as soon as possible at [facility].  He is not a candidate for [facility] due to his condition.

## 2023-04-14 NOTE — Telephone Encounter (Signed)
Do you want [place] to refer him to [facility]?

## 2023-04-15 ENCOUNTER — Ambulatory Visit (HOSPITAL_COMMUNITY): Admission: RE | Admit: 2023-04-15 | Payer: PPO | Source: Home / Self Care | Admitting: Internal Medicine

## 2023-04-15 ENCOUNTER — Encounter (HOSPITAL_COMMUNITY): Admission: RE | Payer: Self-pay | Source: Home / Self Care

## 2023-04-15 SURGERY — COLONOSCOPY WITH PROPOFOL
Anesthesia: Monitor Anesthesia Care

## 2023-04-16 NOTE — Telephone Encounter (Addendum)
Per Dr. Jena Gauss, refer to [facility]  Referral placed

## 2023-04-16 NOTE — Addendum Note (Signed)
Addended by: Armstead Peaks on: 04/16/2023 12:26 PM   Modules accepted: Orders

## 2023-04-16 NOTE — Telephone Encounter (Signed)
From: Lindajo Royal, RN  Sent: 04/16/2023  12:17 PM EDT  To: Jimma Ortman Gillermina Phy, CMA  Subject: RE: Cancellation                               Dr. Alva Garnet said he needs to be done at [facility] because we do not have a neurologist here and if he has one of his episodes here there is nothing we can do.

## 2023-04-21 ENCOUNTER — Ambulatory Visit (HOSPITAL_COMMUNITY)
Admission: RE | Admit: 2023-04-21 | Discharge: 2023-04-21 | Disposition: A | Discharge status: Home / Self Care | Payer: PPO | Source: Ambulatory Visit | Attending: Gastroenterology | Admitting: Gastroenterology

## 2023-04-21 DIAGNOSIS — D500 Iron deficiency anemia secondary to blood loss (chronic): Secondary | ICD-10-CM | POA: Diagnosis not present

## 2023-04-21 DIAGNOSIS — K760 Fatty (change of) liver, not elsewhere classified: Secondary | ICD-10-CM | POA: Diagnosis not present

## 2023-04-21 DIAGNOSIS — R7989 Other specified abnormal findings of blood chemistry: Secondary | ICD-10-CM | POA: Insufficient documentation

## 2023-04-21 DIAGNOSIS — K625 Hemorrhage of anus and rectum: Secondary | ICD-10-CM | POA: Insufficient documentation

## 2023-04-27 ENCOUNTER — Other Ambulatory Visit: Payer: Self-pay | Admitting: Family Medicine

## 2023-04-27 ENCOUNTER — Other Ambulatory Visit: Payer: Self-pay | Admitting: *Deleted

## 2023-04-27 DIAGNOSIS — R768 Other specified abnormal immunological findings in serum: Secondary | ICD-10-CM

## 2023-04-28 NOTE — Telephone Encounter (Signed)
Requested medications are due for refill today.  Unsure  Requested medications are on the active medications list.  yes  Last refill. 04/09/2023 #30 0 rf  Future visit scheduled.   no  Notes to clinic.  Refill not delegated.    Requested Prescriptions  Pending Prescriptions Disp Refills   clonazePAM (KLONOPIN) 0.5 MG tablet [Pharmacy Med Name: CLONAZEPAM 0.5 MG TABLET] 30 tablet 0    Sig: TAKE 1 TABLET (0.5 MG TOTAL) BY MOUTH 3 (THREE) TIMES DAILY AS NEEDED FOR ANXIETY.     Not Delegated - Psychiatry: Anxiolytics/Hypnotics 2 Failed - 04/27/2023 10:11 PM      Failed - This refill cannot be delegated      Failed - Urine Drug Screen completed in last 360 days      Failed - Valid encounter within last 6 months    Recent Outpatient Visits           1 year ago Onychomycosis   [facility] Tanya Nones, Priscille Heidelberg, MD   1 year ago Syncope, unspecified syncope type   [facility] Tanya Nones Priscille Heidelberg, MD   1 year ago Chest pain, unspecified type   [facility] Donita Brooks, MD   1 year ago Flank pain   [facility] Donita Brooks, MD   2 years ago Benign essential HTN   [facility] Pickard, Priscille Heidelberg, MD       Future Appointments             In 1 month Terri Piedra, DO [facility]            Passed - Patient is not pregnant

## 2023-04-29 ENCOUNTER — Other Ambulatory Visit: Payer: Self-pay

## 2023-04-29 DIAGNOSIS — F411 Generalized anxiety disorder: Secondary | ICD-10-CM

## 2023-04-29 NOTE — Telephone Encounter (Signed)
Prescription Request  04/29/2023  LOV: 02/09/23 CPE  What is the name of the medication or equipment? venlafaxine XR (EFFEXOR-XR) 150 MG 24 hr capsule [[id number]]  Have you contacted your pharmacy to request a refill? Yes   Which pharmacy would you like this sent to?  [id number] - [place], [place] - [address] AT [facility] [address]  [place] [address] Phone: [phone] Fax: [phone]    Patient notified that their request is being sent to the clinical staff for review and that they should receive a response within 2 business days.   Please advise at [facility] [phone]

## 2023-04-29 NOTE — Telephone Encounter (Signed)
Unable to refill per protocol, rx request is too soon. Last refill 02/24/23 for 90 days.  Requested Prescriptions  Pending Prescriptions Disp Refills   venlafaxine XR (EFFEXOR-XR) 150 MG 24 hr capsule 90 capsule 0    Sig: TAKE 1 CAPSULE BY MOUTH EVERY DAY IN THE MORNING.     Psychiatry: Antidepressants - SNRI - desvenlafaxine & venlafaxine Failed - 04/29/2023 11:24 AM      Failed - Valid encounter within last 6 months    Recent Outpatient Visits           1 year ago Onychomycosis   [facility] Pickard, Priscille Heidelberg, MD   1 year ago Syncope, unspecified syncope type   [facility] Pickard, Priscille Heidelberg, MD   1 year ago Chest pain, unspecified type   [facility] Tanya Nones Priscille Heidelberg, MD   1 year ago Flank pain   [facility] Donita Brooks, MD   2 years ago Benign essential HTN   [facility] Pickard, Priscille Heidelberg, MD       Future Appointments             In 1 month Terri Piedra, DO [facility]            Failed - Lipid Panel in normal range within the last 12 months    Cholesterol  Date Value Ref Range Status  02/09/2023 168 <200 mg/dL Final   LDL Cholesterol (Calc)  Date Value Ref Range Status  02/09/2023 96 mg/dL (calc) Final    Comment:    Reference range: <100 . Desirable range <100 mg/dL for primary prevention;   <70 mg/dL for patients with CHD or diabetic patients  with > or = 2 CHD risk factors. Marland Kitchen LDL-C is now calculated using the Martin-Hopkins  calculation, which is a validated novel method providing  better accuracy than the Friedewald equation in the  estimation of LDL-C.  Horald Pollen et al. Lenox Ahr. 1610;960(45): 2061-2068  (http://education.[employer].com/faq/FAQ164)    HDL  Date Value Ref Range Status  02/09/2023 49 > OR = 40 mg/dL Final   Triglycerides  Date Value Ref Range Status  02/09/2023 129 <150 mg/dL Final         Passed - Cr in normal range and  within 360 days    Creat  Date Value Ref Range Status  02/09/2023 0.84 0.70 - 1.30 mg/dL Final   Creatinine, Ser  Date Value Ref Range Status  04/13/2023 0.86 0.61 - 1.24 mg/dL Final         Passed - Last BP in normal range    BP Readings from Last 1 Encounters:  04/13/23 134/83

## 2023-05-01 ENCOUNTER — Other Ambulatory Visit: Payer: Self-pay | Admitting: Family Medicine

## 2023-05-01 DIAGNOSIS — F411 Generalized anxiety disorder: Secondary | ICD-10-CM

## 2023-05-01 MED ORDER — VENLAFAXINE HCL ER 150 MG PO CP24
ORAL_CAPSULE | ORAL | 1 refills | Status: DC
Start: 2023-05-01 — End: 2023-11-03

## 2023-05-21 ENCOUNTER — Telehealth: Payer: Self-pay | Admitting: Gastroenterology

## 2023-05-21 NOTE — Telephone Encounter (Signed)
Dr. Chales Abrahams,  Urgent referral in [place] from [facility] for a endo/colon.  Patient is having syncopal episodes and passing out at intervals.  He will need to be done ASAP at [facility].  He is not a candidate for [facility] due to his condition per anaesthesia. (Records in [facility]).  Please review and advise urgency and scheduling.  Thanks Christy  Dr. Chales Abrahams supervising MD 6/27/24AM

## 2023-05-22 NOTE — Telephone Encounter (Signed)
Notes reviewed patient with frequent syncopal episodes Dr. Jena Gauss would like to get  EGD/colonoscopy done at [facility]  -Go ahead proceed with neurology appt  -EGD/colon at [facility] (1st available - any  provider)  RG

## 2023-05-24 ENCOUNTER — Other Ambulatory Visit: Payer: Self-pay | Admitting: Family Medicine

## 2023-05-25 NOTE — Telephone Encounter (Signed)
Lets take that appointment please If there is any cancellation prior with any provider, please go ahead and schedule RG

## 2023-05-26 ENCOUNTER — Other Ambulatory Visit: Payer: Self-pay

## 2023-05-26 DIAGNOSIS — Z86010 Personal history of colonic polyps: Secondary | ICD-10-CM

## 2023-05-26 DIAGNOSIS — K625 Hemorrhage of anus and rectum: Secondary | ICD-10-CM

## 2023-05-26 DIAGNOSIS — D500 Iron deficiency anemia secondary to blood loss (chronic): Secondary | ICD-10-CM

## 2023-05-26 NOTE — Telephone Encounter (Signed)
Pt has been scheduled for 07/16/23 at 7:30 am for Colonoscopy and EGD at [facility] with Dr. Doristine Locks. FYI

## 2023-05-26 NOTE — Telephone Encounter (Signed)
Left message for patient to call back. Endo colon scheduled for 07/16/23 at 7:30 am at [facility] with Dr. Barron Alvine. Amb ref & hospital orders placed. Pt needs to be scheduled for PV as well.

## 2023-05-26 NOTE — Telephone Encounter (Signed)
noted 

## 2023-05-26 NOTE — Telephone Encounter (Signed)
Inbound call from patient regarding procedure. Has been scheduled for PV on 7/17. Advised of procedure scheduled at [facility].

## 2023-05-26 NOTE — Telephone Encounter (Signed)
Requested Prescriptions  Pending Prescriptions Disp Refills   sildenafil (VIAGRA) 100 MG tablet [Pharmacy Med Name: SILDENAFIL 100 MG TABLET] 10 tablet 11    Sig: TAKE 1 TABLET BY MOUTH EVERY DAY AS NEEDED FOR ERECTILE DYSFUNCTION     Urology: Erectile Dysfunction Agents Failed - 05/24/2023  8:06 PM      Failed - AST in normal range and within 360 days    AST  Date Value Ref Range Status  04/13/2023 99 (H) 15 - 41 U/L Final         Failed - ALT in normal range and within 360 days    ALT  Date Value Ref Range Status  04/13/2023 113 (H) 0 - 44 U/L Final         Failed - Valid encounter within last 12 months    Recent Outpatient Visits           1 year ago Onychomycosis   [facility] Pickard, Priscille Heidelberg, MD   1 year ago Syncope, unspecified syncope type   [facility] Pickard, Priscille Heidelberg, MD   1 year ago Chest pain, unspecified type   [facility] Donita Brooks, MD   2 years ago Flank pain   [facility] Donita Brooks, MD   2 years ago Benign essential HTN   [facility] Pickard, Priscille Heidelberg, MD       Future Appointments             In 6 days Terri Piedra, DO [facility]            Passed - Last BP in normal range    BP Readings from Last 1 Encounters:  04/13/23 134/83

## 2023-06-01 ENCOUNTER — Encounter: Payer: Self-pay | Admitting: Dermatology

## 2023-06-01 ENCOUNTER — Ambulatory Visit (INDEPENDENT_AMBULATORY_CARE_PROVIDER_SITE_OTHER): Payer: PPO | Admitting: Dermatology

## 2023-06-01 VITALS — BP 151/96 | HR 73

## 2023-06-01 DIAGNOSIS — D485 Neoplasm of uncertain behavior of skin: Secondary | ICD-10-CM

## 2023-06-01 DIAGNOSIS — B372 Candidiasis of skin and nail: Secondary | ICD-10-CM

## 2023-06-01 DIAGNOSIS — L729 Follicular cyst of the skin and subcutaneous tissue, unspecified: Secondary | ICD-10-CM

## 2023-06-01 DIAGNOSIS — L700 Acne vulgaris: Secondary | ICD-10-CM

## 2023-06-01 MED ORDER — CLINDAMYCIN PHOSPHATE 1 % EX SWAB
1.0000 | Freq: Two times a day (BID) | CUTANEOUS | 3 refills | Status: DC
Start: 2023-06-01 — End: 2023-08-19

## 2023-06-01 MED ORDER — MUPIROCIN 2 % EX OINT
1.0000 | TOPICAL_OINTMENT | Freq: Two times a day (BID) | CUTANEOUS | 0 refills | Status: DC
Start: 2023-06-01 — End: 2023-06-08

## 2023-06-01 MED ORDER — TRETINOIN 0.025 % EX CREA
TOPICAL_CREAM | Freq: Every day | CUTANEOUS | 3 refills | Status: AC
Start: 2023-06-01 — End: 2024-05-31

## 2023-06-01 NOTE — Patient Instructions (Addendum)
Thank you for visiting my office today and discussing your concerns. I appreciate your commitment to improving your health and addressing your dermatological issues. Here is a summary of the key instructions and treatment plans we discussed:   Acne Treatment:   - Use Neutrogena Stubborn Texture wash with salicylic acid and glycolic acid twice daily.   - Apply Clindamycin pads in the morning; can also be used on chest and back.   - Begin Tretinoin 0.025 cream three nights a week (Monday, Wednesday, Friday), with a plan to increase to nightly use.   - Start oral Doxycycline for its anti-inflammatory properties; remember to take it with food to avoid an upset stomach.  - Nail Concern:   - A sample of your nail has been sent to the lab to determine if it is a wart or fungus.   - Continue using separate clippers for the affected nail to prevent spreading.   - Possible future treatments include oral Cimetidine and potentially consulting a dermatologic surgeon.  - Groin Cyst:   - Prescribed Mupirocin ointment to apply twice daily for two weeks.   - Continue taking Doxycycline as it will also help with this condition.   - Use antibacterial soap (e.g., Dove, Panoxyl, or a benzoyl peroxide wash) for washing groin and underarms.  - Follow-Up:   - Please return for a follow-up appointment in three months to evaluate the progress of your treatments. We may adjust your treatment plan based on the results at that time.  Thank you once again for your visit today. If you have any questions or concerns about your treatment plan, do not hesitate to contact our office.   Due to recent changes in healthcare laws, you may see results of your pathology and/or laboratory studies on MyChart before the doctors have had a chance to review them. We understand that in some cases there may be results that are confusing or concerning to you. Please understand that not all results are received at the same time and often the  doctors may need to interpret multiple results in order to provide you with the best plan of care or course of treatment. Therefore, we ask that you please give [place] 2 business days to thoroughly review all your results before contacting the office for clarification. Should we see a critical lab result, you will be contacted sooner.   If You Need Anything After Your Visit  If you have any questions or concerns for your doctor, please call our main line at [phone] If no one answers, please leave a voicemail as directed and we will return your call as soon as possible. Messages left after 4 pm will be answered the following business day.   You may also send [place] a message via MyChart. We typically respond to MyChart messages within 1-2 business days.  For prescription refills, please ask your pharmacy to contact our office. Our fax number is [phone].  If you have an urgent issue when the clinic is closed that cannot wait until the next business day, you can page your doctor at the number below.    Please note that while we do our best to be available for urgent issues outside of office hours, we are not available 24/7.   If you have an urgent issue and are unable to reach [place], you may choose to seek medical care at your doctor's office, retail clinic, urgent care center, or emergency room.  If you have a medical emergency, please immediately  call 911 or go to the emergency department. In the event of inclement weather, please call our main line at [phone] for an update on the status of any delays or closures.  Dermatology Medication Tips: Please keep the boxes that topical medications come in in order to help keep track of the instructions about where and how to use these. Pharmacies typically print the medication instructions only on the boxes and not directly on the medication tubes.   If your medication is too expensive, please contact our office at [phone] or send [place] a message  through MyChart.   We are unable to tell what your co-pay for medications will be in advance as this is different depending on your insurance coverage. However, we may be able to find a substitute medication at lower cost or fill out paperwork to get insurance to cover a needed medication.   If a prior authorization is required to get your medication covered by your insurance company, please allow [place] 1-2 business days to complete this process.  Drug prices often vary depending on where the prescription is filled and some pharmacies may offer cheaper prices.  The website www.goodrx.com contains coupons for medications through different pharmacies. The prices here do not account for what the cost may be with help from insurance (it may be cheaper with your insurance), but the website can give you the price if you did not use any insurance.  - You can print the associated coupon and take it with your prescription to the pharmacy.  - You may also stop by our office during regular business hours and pick up a GoodRx coupon card.  - If you need your prescription sent electronically to a different pharmacy, notify our office through [facility] or by phone at [phone]

## 2023-06-01 NOTE — Progress Notes (Signed)
New Patient Visit   Subjective  [name] is a 57 y.o. male accompanied by his wife Silva Bandy) who presents for the following: Acne, Nail fungus and Boil at left Groin  Patient states he  has acne located at the face,chest and back that he  would like to have examined. Patient reports the areas have been there since age of 30 or 52.  He  reports the areas are bothersome. He states that the areas have spread. Patient reports has previously been treated for these areas. He was seen by Dr. Jorja Loa many years ago but wasn't prescribed any medications but has tried OTC 10% Benzoid peroxide. Patient denies Hx of bx. Patient denies family history of skin cancer(s). Patient reports he has had a great deal of sun exposure during his lifetime but now it's very minimal. He states he doesn't currently wear any sunscreen when exposed to sun.  The patient reports he has a nail fungus that he would also like to have examined. He states the area is painful & has been present for several years. He was previously prescribed a oral medication years ago but it did not help. He also has a cyst at his left groin he would like to have examined. He states it has been there for about 3 weeks on and off. He currently uses Olay body washes but no additional products. He reports at times, when flared, it is very painful. He states it does drain puss and blood so he keeps a bandaid on the area to keep it protected.  The following portions of the chart were reviewed this encounter and updated as appropriate: medications, allergies, medical history  Review of Systems:  No other skin or systemic complaints except as noted in HPI or Assessment and Plan.  Objective  Well appearing patient in no apparent distress; mood and affect are within normal limits.  A focused examination was performed of the following areas: - Face, Chest and Back  - Left Middle finger - Left Groin  Relevant exam findings are noted in the  Assessment and Plan.            Assessment & Plan   ACNE VULGARIS Exam: Open comedones and inflammatory papules  wellcontrolled vs notatgoal vs flared  Treatment Plan: - Recommended Neutrogena Stubborn Texture - Use Clindamycin swabs in the morning - We will prescribe Tretinoin 3 Nights weekly  - Doxycycline 100 mg nightly with Evening meal  Nail growth R/O WART vs fungal  Exam: verrucous papule(s)  Counseling Discussed viral / HPV (Human Papilloma Virus) etiology and risk of spread /infectivity to other areas of body as well as to other people.  Multiple treatments and methods may be required to clear warts and it is possible treatment may not be successful.  Treatment risks include discoloration; scarring and there is still potential for wart recurrence.  Treatment Plan: - We will bx today to r/o nail fungus vs Wart    Inflamed Cyst or Folliculitis in the Groin Area    - Assessment: Prescribe oral doxycycline for its anti-inflammatory properties.    - Plan: Prescribe Mupirocin ointment to be applied twice a day for two weeks. Advise the patient to wash groin and underarms with antibacterial soap (Dove antibacterial, Pinoxal, or 4% benzoyl peroxide wash like Jani Files). Reevaluate at the follow-up appointment in three months.  Acne vulgaris  Related Medications clindamycin (CLEOCIN T) 1 % SWAB Apply 1 Application topically 2 (two) times daily.  tretinoin (RETIN-A) 0.025 %  cream Apply topically at bedtime. Apply M-W-F nights  mupirocin ointment (BACTROBAN) 2 % Apply 1 Application topically 2 (two) times daily.  Neoplasm of uncertain behavior of skin Left 3rd Finger Nail Plate  Skin / nail biopsy Type of biopsy: tangential   Informed consent: discussed and consent obtained   Timeout: patient name, date of birth, surgical site, and procedure verified   Instrument used: scissors   Outcome: patient tolerated procedure well    Specimen 1 - Surgical  pathology Differential Diagnosis: Wart vs Fungus  Check Margins: No    Return in about 3 months (around 09/01/2023) for Acne F/U .  Documentation: I have reviewed the above documentation for accuracy and completeness, and I agree with the above.  Stasia Cavalier, am acting as scribe for Langston Reusing, DO.  Langston Reusing, DO

## 2023-06-02 ENCOUNTER — Telehealth: Payer: Self-pay

## 2023-06-02 MED ORDER — DOXYCYCLINE HYCLATE 100 MG PO TABS: 100.0000 mg | ORAL_TABLET | Freq: Every day | ORAL | 3 refills | Status: DC

## 2023-06-02 NOTE — Telephone Encounter (Signed)
He left a voicemail saying the pharmacy did not receive the Doxycycline. I checked the OV note. Do you recall what the SIG was for this?

## 2023-06-02 NOTE — Addendum Note (Signed)
Addended byWaynetta Pean on: 06/02/2023 05:42 PM   Modules accepted: Orders

## 2023-06-07 ENCOUNTER — Other Ambulatory Visit: Payer: Self-pay | Admitting: Dermatology

## 2023-06-07 DIAGNOSIS — L700 Acne vulgaris: Secondary | ICD-10-CM

## 2023-06-08 ENCOUNTER — Other Ambulatory Visit: Payer: Self-pay | Admitting: Family Medicine

## 2023-06-08 DIAGNOSIS — I10 Essential (primary) hypertension: Secondary | ICD-10-CM

## 2023-06-08 NOTE — Telephone Encounter (Signed)
Prescription Request  06/08/2023  LOV: 02/09/2023  What is the name of the medication or equipment?   amLODipine (NORVASC) 10 MG tablet   Have you contacted your pharmacy to request a refill? Yes   Which pharmacy would you like this sent to?  [id number] - [place], [place] - [address] AT [facility] [address] [place] [address] Phone: [phone] Fax: [phone]    Patient notified that their request is being sent to the clinical staff for review and that they should receive a response within 2 business days.   Please advise pharmacist.

## 2023-06-08 NOTE — Progress Notes (Signed)
[name],  Please call patient and notify him that the nail clipping was positive for yeast but fungal stains were negative. For treatment I recommend he do vinegar soaks once a day for 10 minutes. He will mix 1 part white vinegar with equal part warm water, soak for 10 minutes ten rinse off with warm water.  He will continue this for 2 months and then return for follow up visit.   Diagnosis Skin , left 3rd finger nail plate BUDDING YEAST PRESENT, SEE DESCRIPTION Microscopic Description Nail fragments stained with PAS reveal budding yeast, but no fungal hyphae

## 2023-06-09 MED ORDER — AMLODIPINE BESYLATE 10 MG PO TABS
10.0000 mg | ORAL_TABLET | Freq: Every day | ORAL | 0 refills | Status: DC
Start: 2023-06-09 — End: 2023-08-31

## 2023-06-09 NOTE — Telephone Encounter (Signed)
Requested Prescriptions  Pending Prescriptions Disp Refills   amLODipine (NORVASC) 10 MG tablet 90 tablet 0    Sig: Take 1 tablet (10 mg total) by mouth daily.     Cardiovascular: Calcium Channel Blockers 2 Failed - 06/08/2023 11:57 AM      Failed - Last BP in normal range    BP Readings from Last 1 Encounters:  06/01/23 (!) 151/96         Failed - Valid encounter within last 6 months    Recent Outpatient Visits           1 year ago Onychomycosis   [facility] Tanya Nones, Priscille Heidelberg, MD   1 year ago Syncope, unspecified syncope type   [facility] Tanya Nones Priscille Heidelberg, MD   1 year ago Chest pain, unspecified type   [facility] Donita Brooks, MD   2 years ago Flank pain   [facility] Donita Brooks, MD   2 years ago Benign essential HTN   [facility] Donita Brooks, MD              Passed - Last Heart Rate in normal range    Pulse Readings from Last 1 Encounters:  06/01/23 73

## 2023-06-10 ENCOUNTER — Encounter: Payer: Self-pay | Admitting: Gastroenterology

## 2023-06-10 ENCOUNTER — Ambulatory Visit (AMBULATORY_SURGERY_CENTER): Payer: PPO

## 2023-06-10 VITALS — Ht 68.0 in | Wt 220.0 lb

## 2023-06-10 DIAGNOSIS — D500 Iron deficiency anemia secondary to blood loss (chronic): Secondary | ICD-10-CM

## 2023-06-10 DIAGNOSIS — Z86010 Personal history of colonic polyps: Secondary | ICD-10-CM

## 2023-06-10 DIAGNOSIS — K625 Hemorrhage of anus and rectum: Secondary | ICD-10-CM

## 2023-06-10 MED ORDER — NA SULFATE-K SULFATE-MG SULF 17.5-3.13-1.6 GM/177ML PO SOLN
1.0000 | Freq: Once | ORAL | 0 refills | Status: AC
Start: 2023-06-10 — End: 2023-06-10

## 2023-06-10 NOTE — Progress Notes (Signed)
No egg or soy allergy known to patient  No issues known to pt with past sedation with any surgeries or procedures Patient denies ever being told they had issues or difficulty with intubation  No FH of Malignant Hyperthermia Pt is not on diet pills Pt is not on  home 02  Pt is not on blood thinners  Pt denies issues with constipation  No A fib or A flutter Have any cardiac testing pending--no Pt instructed to use Singlecare.com or GoodRx for a price reduction on prep Patient's chart reviewed by Cathlyn Parsons CNRA prior to previsit and patient appropriate for the [facility].  Previsit completed and red dot placed by patient's name on their procedure day (on provider's schedule).   Cam ambulate independently

## 2023-06-22 ENCOUNTER — Other Ambulatory Visit: Payer: Self-pay | Admitting: Family Medicine

## 2023-07-15 NOTE — Anesthesia Preprocedure Evaluation (Signed)
Anesthesia Evaluation  Patient identified by MRN, date of birth, ID band Patient awake    Reviewed: Allergy & Precautions, NPO status , Patient's Chart, lab work & pertinent test results, reviewed documented beta blocker date and time   Airway Mallampati: III  TM Distance: >3 FB Neck ROM: Full    Dental  (+) Teeth Intact, Dental Advisory Given   Pulmonary sleep apnea , Current Smoker   Pulmonary exam normal breath sounds clear to auscultation       Cardiovascular hypertension, Pt. on home beta blockers and Pt. on medications Normal cardiovascular exam Rhythm:Regular Rate:Normal     Neuro/Psych  PSYCHIATRIC DISORDERS Anxiety     negative neurological ROS     GI/Hepatic PUD,GERD  Medicated,,(+)     substance abuse  alcohol use IDA; Rectal bleeding; History of colon polyps   Endo/Other  negative endocrine ROS    Renal/GU negative Renal ROS     Musculoskeletal  (+) Arthritis ,    Abdominal   Peds  Hematology negative hematology ROS (+)   Anesthesia Other Findings   Reproductive/Obstetrics                             Anesthesia Physical Anesthesia Plan  ASA: 3  Anesthesia Plan: MAC   Post-op Pain Management: Minimal or no pain anticipated   Induction: Intravenous  PONV Risk Score and Plan: 0 and Treatment may vary due to age or medical condition and TIVA  Airway Management Planned: Natural Airway and Simple Face Mask  Additional Equipment:   Intra-op Plan:   Post-operative Plan:   Informed Consent: I have reviewed the patients History and Physical, chart, labs and discussed the procedure including the risks, benefits and alternatives for the proposed anesthesia with the patient or authorized representative who has indicated his/her understanding and acceptance.     Dental advisory given  Plan Discussed with: CRNA  Anesthesia Plan Comments:        Anesthesia Quick  Evaluation

## 2023-07-16 ENCOUNTER — Ambulatory Visit (HOSPITAL_COMMUNITY)
Admission: RE | Admit: 2023-07-16 | Discharge: 2023-07-16 | Disposition: A | Discharge status: Home / Self Care | Payer: PPO | Attending: Gastroenterology | Admitting: Gastroenterology

## 2023-07-16 ENCOUNTER — Ambulatory Visit (HOSPITAL_COMMUNITY): Payer: PPO | Admitting: Certified Registered"

## 2023-07-16 ENCOUNTER — Encounter (HOSPITAL_COMMUNITY): Payer: Self-pay | Admitting: Gastroenterology

## 2023-07-16 ENCOUNTER — Other Ambulatory Visit: Payer: Self-pay

## 2023-07-16 ENCOUNTER — Encounter (HOSPITAL_COMMUNITY)
Admission: RE | Disposition: A | Discharge status: Home / Self Care | Payer: Self-pay | Source: Home / Self Care | Attending: Gastroenterology

## 2023-07-16 ENCOUNTER — Ambulatory Visit (HOSPITAL_BASED_OUTPATIENT_CLINIC_OR_DEPARTMENT_OTHER): Payer: PPO | Admitting: Certified Registered"

## 2023-07-16 DIAGNOSIS — F17210 Nicotine dependence, cigarettes, uncomplicated: Secondary | ICD-10-CM | POA: Diagnosis not present

## 2023-07-16 DIAGNOSIS — K625 Hemorrhage of anus and rectum: Secondary | ICD-10-CM | POA: Diagnosis not present

## 2023-07-16 DIAGNOSIS — Z86010 Personal history of colonic polyps: Secondary | ICD-10-CM

## 2023-07-16 DIAGNOSIS — K31811 Angiodysplasia of stomach and duodenum with bleeding: Secondary | ICD-10-CM | POA: Diagnosis not present

## 2023-07-16 DIAGNOSIS — K648 Other hemorrhoids: Secondary | ICD-10-CM | POA: Diagnosis not present

## 2023-07-16 DIAGNOSIS — Z79899 Other long term (current) drug therapy: Secondary | ICD-10-CM | POA: Diagnosis not present

## 2023-07-16 DIAGNOSIS — Z8249 Family history of ischemic heart disease and other diseases of the circulatory system: Secondary | ICD-10-CM | POA: Diagnosis not present

## 2023-07-16 DIAGNOSIS — D124 Benign neoplasm of descending colon: Secondary | ICD-10-CM

## 2023-07-16 DIAGNOSIS — K5730 Diverticulosis of large intestine without perforation or abscess without bleeding: Secondary | ICD-10-CM | POA: Insufficient documentation

## 2023-07-16 DIAGNOSIS — K449 Diaphragmatic hernia without obstruction or gangrene: Secondary | ICD-10-CM | POA: Diagnosis not present

## 2023-07-16 DIAGNOSIS — K2270 Barrett's esophagus without dysplasia: Secondary | ICD-10-CM | POA: Diagnosis not present

## 2023-07-16 DIAGNOSIS — K635 Polyp of colon: Secondary | ICD-10-CM

## 2023-07-16 DIAGNOSIS — F419 Anxiety disorder, unspecified: Secondary | ICD-10-CM | POA: Insufficient documentation

## 2023-07-16 DIAGNOSIS — G4730 Sleep apnea, unspecified: Secondary | ICD-10-CM | POA: Insufficient documentation

## 2023-07-16 DIAGNOSIS — D500 Iron deficiency anemia secondary to blood loss (chronic): Secondary | ICD-10-CM

## 2023-07-16 DIAGNOSIS — K2970 Gastritis, unspecified, without bleeding: Secondary | ICD-10-CM | POA: Diagnosis not present

## 2023-07-16 DIAGNOSIS — D649 Anemia, unspecified: Secondary | ICD-10-CM | POA: Diagnosis not present

## 2023-07-16 DIAGNOSIS — K621 Rectal polyp: Secondary | ICD-10-CM | POA: Diagnosis not present

## 2023-07-16 DIAGNOSIS — D509 Iron deficiency anemia, unspecified: Secondary | ICD-10-CM

## 2023-07-16 DIAGNOSIS — I10 Essential (primary) hypertension: Secondary | ICD-10-CM | POA: Diagnosis not present

## 2023-07-16 DIAGNOSIS — M1990 Unspecified osteoarthritis, unspecified site: Secondary | ICD-10-CM | POA: Insufficient documentation

## 2023-07-16 DIAGNOSIS — K219 Gastro-esophageal reflux disease without esophagitis: Secondary | ICD-10-CM

## 2023-07-16 DIAGNOSIS — Z8711 Personal history of peptic ulcer disease: Secondary | ICD-10-CM | POA: Diagnosis not present

## 2023-07-16 DIAGNOSIS — K921 Melena: Secondary | ICD-10-CM | POA: Diagnosis not present

## 2023-07-16 DIAGNOSIS — K5520 Angiodysplasia of colon without hemorrhage: Secondary | ICD-10-CM

## 2023-07-16 DIAGNOSIS — Z980 Intestinal bypass and anastomosis status: Secondary | ICD-10-CM | POA: Insufficient documentation

## 2023-07-16 DIAGNOSIS — K31819 Angiodysplasia of stomach and duodenum without bleeding: Secondary | ICD-10-CM

## 2023-07-16 DIAGNOSIS — K2951 Unspecified chronic gastritis with bleeding: Secondary | ICD-10-CM | POA: Diagnosis not present

## 2023-07-16 HISTORY — PX: BIOPSY: SHX5522

## 2023-07-16 HISTORY — PX: POLYPECTOMY: SHX5525

## 2023-07-16 HISTORY — PX: HOT HEMOSTASIS: SHX5433

## 2023-07-16 HISTORY — PX: COLONOSCOPY WITH PROPOFOL: SHX5780

## 2023-07-16 HISTORY — PX: ESOPHAGOGASTRODUODENOSCOPY (EGD) WITH PROPOFOL: SHX5813

## 2023-07-16 SURGERY — ESOPHAGOGASTRODUODENOSCOPY (EGD) WITH PROPOFOL
Anesthesia: Monitor Anesthesia Care

## 2023-07-16 MED ORDER — LIDOCAINE 2% (20 MG/ML) 5 ML SYRINGE
INTRAMUSCULAR | Status: DC | PRN
  Administered 2023-07-16: 100 mg via INTRAVENOUS

## 2023-07-16 MED ORDER — SODIUM CHLORIDE 0.9 % IV SOLN: INTRAVENOUS | Status: DC

## 2023-07-16 MED ORDER — PROPOFOL 10 MG/ML IV BOLUS
INTRAVENOUS | Status: DC | PRN
  Administered 2023-07-16: 30 mg via INTRAVENOUS
  Administered 2023-07-16: 100 mg via INTRAVENOUS
  Administered 2023-07-16: 30 mg via INTRAVENOUS

## 2023-07-16 MED ORDER — LACTATED RINGERS IV SOLN: INTRAVENOUS | Status: DC

## 2023-07-16 MED ORDER — PROPOFOL 500 MG/50ML IV EMUL
INTRAVENOUS | Status: DC | PRN
  Administered 2023-07-16: 150 ug/kg/min via INTRAVENOUS

## 2023-07-16 SURGICAL SUPPLY — 25 items

## 2023-07-16 NOTE — Transfer of Care (Signed)
Immediate Anesthesia Transfer of Care Note  Patient: NEEMA NAIR  Procedure(s) Performed: ESOPHAGOGASTRODUODENOSCOPY (EGD) WITH PROPOFOL COLONOSCOPY WITH PROPOFOL HOT HEMOSTASIS (ARGON PLASMA COAGULATION/BICAP) BIOPSY POLYPECTOMY  Patient Location: PACU and Endoscopy Unit  Anesthesia Type:MAC  Level of Consciousness: awake, alert , and oriented  Airway & Oxygen Therapy: Patient Spontanous Breathing  Post-op Assessment: Report given to RN and Post -op Vital signs reviewed and stable  Post vital signs: Reviewed and stable  Last Vitals:  Vitals Value Taken Time  BP    Temp    Pulse 91 07/16/23 0825  Resp    SpO2 93 % 07/16/23 0825  Vitals shown include unfiled device data.  Last Pain:  Vitals:   07/16/23 0658  TempSrc: Temporal  PainSc: 2       Patients Stated Pain Goal: 2 (07/16/23 7371)  Complications: No notable events documented.

## 2023-07-16 NOTE — Discharge Instructions (Signed)

## 2023-07-16 NOTE — Op Note (Signed)
[facility] Patient Name: [name] Procedure Date : 07/16/2023 MRN: [id number] Attending MD: Doristine Locks , MD, [id number] Date of Birth: Jul 11, 1966 CSN: [id number] Age: 57 Admit Type: Inpatient Procedure:                Colonoscopy Indications:              Surveillance: Personal history of adenomatous                            polyps on last colonoscopy 5 years ago                           Incidental - Iron deficiency anemia Providers:                Doristine Locks, MD, Martha Clan, RN, Rozetta Nunnery, Technician Referring MD:             Gennette Pac, MD Medicines:                Monitored Anesthesia Care Complications:            No immediate complications. Estimated Blood Loss:     Estimated blood loss was minimal. Procedure:                Pre-Anesthesia Assessment:                           - Prior to the procedure, a History and Physical                            was performed, and patient medications and                            allergies were reviewed. The patient's tolerance of                            previous anesthesia was also reviewed. The risks                            and benefits of the procedure and the sedation                            options and risks were discussed with the patient.                            All questions were answered, and informed consent                            was obtained. Prior Anticoagulants: The patient has                            taken no anticoagulant or antiplatelet agents. ASA  Grade Assessment: III - A patient with severe                            systemic disease. After reviewing the risks and                            benefits, the patient was deemed in satisfactory                            condition to undergo the procedure.                           After obtaining informed consent, the colonoscope                             was passed under direct vision. Throughout the                            procedure, the patient's blood pressure, pulse, and                            oxygen saturations were monitored continuously. The                            CF-HQ190L ([id number]) Olympus coloscope was                            introduced through the anus and advanced to the the                            cecum, identified by appendiceal orifice and                            ileocecal valve. The colonoscopy was performed                            without difficulty. The patient tolerated the                            procedure well. The quality of the bowel                            preparation was fair. The ileocecal valve,                            appendiceal orifice, and rectum were photographed. Scope In: 8:00:13 AM Scope Out: 8:15:50 AM Scope Withdrawal Time: 0 hours 13 minutes 36 seconds  Total Procedure Duration: 0 hours 15 minutes 37 seconds  Findings:      The perianal and digital rectal examinations were normal.      A 5 mm polyp was found in the descending colon. The polyp was sessile.       The polyp was removed with a cold snare. Resection and retrieval were       complete. Estimated blood loss was minimal.  Four sessile polyps were found in the rectum. The polyps were 1 to 3 mm       in size. These polyps were removed with a cold biopsy forceps. Resection       and retrieval were complete. Estimated blood loss was minimal.      A few small-mouthed diverticula were found in the sigmoid colon and       ascending colon.      There was evidence of a prior end-to-end colo-colonic anastomosis in the       recto-sigmoid colon. This was patent and was characterized by healthy       appearing mucosa.      Non-bleeding internal hemorrhoids were found during retroflexion. The       hemorrhoids were small. There were a few faint scars from prior       hemorrhoid banding.      A moderate  amount of stool was found in the entire colon. Lavage of the       area was performed using copious amounts of tap water, resulting in       clearance with adequate visualization. Impression:               - Preparation of the colon was fair, but improved                            with copious irrigation.                           - One 5 mm polyp in the descending colon, removed                            with a cold snare. Resected and retrieved.                           - Four 1 to 3 mm polyps in the rectum, removed with                            a cold biopsy forceps. Resected and retrieved.                           - Diverticulosis in the sigmoid colon and in the                            ascending colon.                           - Patent end-to-end colo-colonic anastomosis,                            characterized by healthy appearing mucosa.                           - Non-bleeding internal hemorrhoids.                           - Stool in the entire examined colon. Recommendation:           - Patient has a contact number  available for                            emergencies. The signs and symptoms of potential                            delayed complications were discussed with the                            patient. Return to normal activities tomorrow.                            Written discharge instructions were provided to the                            patient.                           - Resume previous diet.                           - Continue present medications.                           - Await pathology results.                           - Repeat colonoscopy for surveillance based on                            pathology results.                           - Follow-up with Dr. Jena Gauss in the [facility]                            office at appointment to be scheduled. Procedure Code(s):        --- Professional ---                           [phone], Colonoscopy,  flexible; with removal of                            tumor(s), polyp(s), or other lesion(s) by snare                            technique                           45380, 59, Colonoscopy, flexible; with biopsy,                            single or multiple Diagnosis Code(s):        --- Professional ---                           Z86.010, Personal history of colonic polyps  K64.8, Other hemorrhoids                           D12.4, Benign neoplasm of descending colon                           D12.8, Benign neoplasm of rectum                           Z98.0, Intestinal bypass and anastomosis status                           K57.30, Diverticulosis of large intestine without                            perforation or abscess without bleeding CPT copyright 2022 American Medical Association. All rights reserved. The codes documented in this report are preliminary and upon coder review may  be revised to meet current compliance requirements. Doristine Locks, MD 07/16/2023 8:45:52 AM Number of Addenda: 0

## 2023-07-16 NOTE — Interval H&P Note (Signed)
History and Physical Interval Note:  07/16/2023 7:25 AM  [name]  has presented today for surgery, with the diagnosis of IDA; Rectal bleeding; History of colon polyps.  The various methods of treatment have been discussed with the patient and family. After consideration of risks, benefits and other options for treatment, the patient has consented to  Procedure(s): ESOPHAGOGASTRODUODENOSCOPY (EGD) WITH PROPOFOL (N/A) COLONOSCOPY WITH PROPOFOL (N/A) as a surgical intervention.  The patient's history has been reviewed, patient examined, no change in status, stable for surgery.  I have reviewed the patient's chart and labs.  Questions were answered to the patient's satisfaction.     Verlin Dike Vernal Rutan

## 2023-07-16 NOTE — H&P (Signed)
GASTROENTEROLOGY PROCEDURE H&P NOTE   Primary Care Physician: Donita Brooks, MD    Reason for Procedure:  Iron deficiency anemia, symptomatic anemia, hematochezia, colon polyp surveillance  Plan:    EGD, colonoscopy   Patient is appropriate for endoscopic procedure(s) at [facility].  The nature of the procedure, as well as the risks, benefits, and alternatives were carefully and thoroughly reviewed with the patient. Ample time for discussion and questions allowed. The patient understood, was satisfied, and agreed to proceed.     HPI: [name] is a 57 y.o. male who presents for EGD and colonoscopy for evaluation of iron deficiency anemia, symptomatic anemia, and hematochezia.  Additionally, due for colonoscopy for ongoing polyp surveillance.  Patient follows with Dr. Jena Gauss at [facility] and was referred to [facility]oscopy unit for EGD/colonoscopy in the hospital setting due to elevated periprocedural risks from underlying comorbidities.  Gastrorrhapy for perforated ulcer in 2017. Partial colectomy appendectomy for perforated diverticulitis and 2016.    Underwent hemorrhoid banding in 2021 initially with symptomatic improvement.  More recently with recurrence of what he thinks are hemorrhoidal symptoms, including intermittent BRBPR and rectal itching.   Colonoscopy 11/2017: -diverticulosis -nonbleeding hemorrhoids -two 7-8 mm polyps removed from rectosigmoid colon. -one tubular adenoma, next colonoscopy five years  Past Medical History:  Diagnosis Date   Allergy    Anemia    Colonic diverticular abscess 12/26/2014   Diverticulitis    ETOH abuse    GAD (generalized anxiety disorder)    GERD (gastroesophageal reflux disease)    History of fainting spells of unknown cause    Hypercholesteremia    Hypertension    Left rotator cuff tear 02/08/2019   Sleep apnea    Superior labrum anterior-to-posterior (SLAP) tear of left  shoulder 02/08/2019   Tobacco abuse     Past Surgical History:  Procedure Laterality Date   APPENDECTOMY N/A 01/01/2015   Procedure: INCIDENTAL APPENDECTOMY;  Surgeon: Dalia Heading, MD;  Location: [facility];  Service: General;  Laterality: N/A;   BACK SURGERY     lower back, fusion   COLONOSCOPY WITH PROPOFOL N/A 12/21/2017   Procedure: COLONOSCOPY WITH PROPOFOL;  Surgeon: Corbin Ade, MD;  Location: [facility];  Service: Endoscopy;  Laterality: N/A;  9:45am   GASTRECTOMY N/A 04/14/2016   Procedure: GASTRORRHAPHY;  Surgeon: Franky Macho, MD;  Location: [facility];  Service: General;  Laterality: N/A;   HEMORRHOID SURGERY     HERNIA REPAIR     as child   LOOP RECORDER INSERTION N/A 09/06/2021   Procedure: LOOP RECORDER INSERTION;  Surgeon: Thurmon Fair, MD;  Location: [facility];  Service: Cardiovascular;  Laterality: N/A;   PARTIAL COLECTOMY N/A 01/01/2015   Procedure: PARTIAL COLECTOMY;  Surgeon: Dalia Heading, MD;  Location: [facility];  Service: General;  Laterality: N/A;   POLYPECTOMY  12/21/2017   Procedure: POLYPECTOMY;  Surgeon: Corbin Ade, MD;  Location: [facility];  Service: Endoscopy;;  colon   ROTATOR CUFF REPAIR Bilateral    SHOULDER ARTHROSCOPY WITH ROTATOR CUFF REPAIR AND SUBACROMIAL DECOMPRESSION Left 02/09/2019   Procedure: SHOULDER ARTHROSCOPY WITH ROTATOR CUFF REPAIR AND SUBACROMIAL PARTIAL ACROMIOPLASTY, DISTAL CLAVICULECTOMY, AND EXTENSIVE DEBRIDEMENT;  Surgeon: Salvatore Marvel, MD;  Location: [facility];  Service: Orthopedics;  Laterality: Left;    Prior to Admission medications   Medication Sig Start Date End Date Taking? Authorizing Provider  amLODipine (NORVASC) 10 MG tablet Take 1  tablet (10 mg total) by mouth daily. 06/09/23  Yes Donita Brooks, MD  atorvastatin (LIPITOR) 40 MG tablet TAKE 1 TABLET BY MOUTH EVERY DAY 03/25/18  Yes Donita Brooks, MD  carvedilol (COREG) 12.5 MG tablet Take 1 tablet (12.5 mg total) by mouth 2 (two)  times daily with a meal. 03/20/23  Yes Donita Brooks, MD  clindamycin (CLEOCIN T) 1 % SWAB Apply 1 Application topically 2 (two) times daily. 06/01/23  Yes Terri Piedra, DO  clonazePAM (KLONOPIN) 0.5 MG tablet TAKE 1 TABLET (0.5 MG TOTAL) BY MOUTH 3 (THREE) TIMES DAILY AS NEEDED FOR ANXIETY. 06/22/23  Yes Donita Brooks, MD  doxycycline (VIBRA-TABS) 100 MG tablet Take 1 tablet (100 mg total) by mouth daily. TAKE WITH HEAVY MEAL, AVOID ALCOHOL 06/02/23 09/30/23 Yes Terri Piedra, DO  fluticasone [facility]) 50 MCG/ACT nasal spray SPRAY 2 SPRAYS INTO EACH NOSTRIL EVERY DAY 12/29/22  Yes Donita Brooks, MD  losartan (COZAAR) 50 MG tablet Take 1 tablet (50 mg total) by mouth daily. 02/24/23  Yes Donita Brooks, MD  Menthol (BIOFREEZE) 10.5 % AERO Apply 1-2 sprays topically as needed (pain).   Yes [provider]  mupirocin ointment (BACTROBAN) 2 % APPLY TO AFFECTED AREA TWICE A DAY 06/08/23  Yes Terri Piedra, DO  pantoprazole (PROTONIX) 40 MG tablet TAKE 1 TABLET BY MOUTH EVERY DAY 03/18/23  Yes Donita Brooks, MD  sildenafil (VIAGRA) 100 MG tablet TAKE 1 TABLET BY MOUTH EVERY DAY AS NEEDED FOR ERECTILE DYSFUNCTION 05/26/23  Yes Donita Brooks, MD  tretinoin (RETIN-A) 0.025 % cream Apply topically at bedtime. Apply M-W-F nights 06/01/23 05/31/24 Yes Terri Piedra, DO  venlafaxine XR (EFFEXOR-XR) 150 MG 24 hr capsule TAKE 1 CAPSULE BY MOUTH EVERY DAY IN THE MORNING. 05/01/23  Yes Donita Brooks, MD  cyclobenzaprine (FLEXERIL) 10 MG tablet TAKE 1 TABLET BY MOUTH EVERYDAY AT BEDTIME Patient taking differently: Take 10 mg by mouth at bedtime. 10/02/20   Donita Brooks, MD    Current Facility-Administered Medications  Medication Dose Route Frequency Provider Last Rate Last Admin   0.9 %  sodium chloride infusion   Intravenous Continuous Lynann Bologna, MD       lactated ringers infusion   Intravenous Continuous Cy Bresee V, DO 10 mL/hr at 07/16/23 0709 New Bag at 07/16/23  0709    Allergies as of 05/26/2023   (No Known Allergies)    Family History  Problem Relation Age of Onset   Diabetes Mother    Stroke Mother    Stroke Father    CAD Father    Colon cancer Neg Hx     Social History   Socioeconomic History   Marital status: Married    Spouse name: Not on file   Number of children: Not on file   Years of education: Not on file   Highest education level: Not on file  Occupational History   Not on file  Tobacco Use   Smoking status: Every Day    Current packs/day: 1.00    Average packs/day: 1 pack/day for 35.0 years (35.0 ttl pk-yrs)    Types: Cigarettes    Passive exposure: Current (Wife is a smoker)   Smokeless tobacco: Never   Tobacco comments:    1.5 ppd since age 33.   Vaping Use   Vaping status: Never Used  Substance and Sexual Activity   Alcohol use: Yes    Alcohol/week: 48.0 standard drinks of alcohol  Types: 48 Cans of beer per week    Comment: patient states "about 5 or 6 a day" (beers)   Drug use: No   Sexual activity: Yes  Other Topics Concern   Not on file  Social History Narrative   Not on file   Social Determinants of Health   Financial Resource Strain: Not on file  Food Insecurity: No Food Insecurity (10/14/2022)   Hunger Vital Sign    Worried About Running Out of Food in the Last Year: Never true    Ran Out of Food in the Last Year: Never true  Transportation Needs: No Transportation Needs (10/14/2022)   PRAPARE - Administrator, Civil Service (Medical): No    Lack of Transportation (Non-Medical): No  Physical Activity: Insufficiently Active (10/14/2022)   Exercise Vital Sign    Days of Exercise per Week: 3 days    Minutes of Exercise per Session: 10 min  Stress: No Stress Concern Present (10/14/2022)   [employer] of Occupational Health - Occupational Stress Questionnaire    Feeling of Stress : Only a little  Social Connections: Unknown (10/14/2022)   Social Connection and Isolation  Panel [NHANES]    Frequency of Communication with Friends and Family: Not on file    Frequency of Social Gatherings with Friends and Family: Not on file    Attends Religious Services: Never    Active Member of Clubs or Organizations: Not on file    Attends Banker Meetings: Not on file    Marital Status: Not on file  Intimate Partner Violence: Not At Risk (10/14/2022)   Humiliation, Afraid, Rape, and Kick questionnaire    Fear of Current or Ex-Partner: No    Emotionally Abused: No    Physically Abused: No    Sexually Abused: No    Physical Exam: Vital signs in last 24 hours: @BP  137/79   Pulse 75   Temp (!) 97.4 F (36.3 C) (Temporal)   Resp 20   Ht 5\' 8"  (1.727 m)   Wt 99.8 kg   SpO2 92%   BMI 33.45 kg/m  GEN: NAD EYE: Sclerae anicteric ENT: MMM CV: Non-tachycardic Pulm: CTA b/l GI: Soft, NT/ND NEURO:  Alert & Oriented x 3   Doristine Locks, DO [facility]   07/16/2023 7:23 AM

## 2023-07-16 NOTE — Op Note (Signed)
[facility] Patient Name: [name] Procedure Date : 07/16/2023 MRN: [id number] Attending MD: Doristine Locks , MD, [id number] Date of Birth: 06-01-1966 CSN: [id number] Age: 57 Admit Type: Inpatient Procedure:                Upper GI endoscopy Indications:              Iron deficiency anemia Providers:                Doristine Locks, MD, Martha Clan, RN, Rozetta Nunnery, Technician Referring MD:             Gennette Pac, MD Medicines:                Monitored Anesthesia Care Complications:            No immediate complications. Estimated Blood Loss:     Estimated blood loss was minimal. Procedure:                Pre-Anesthesia Assessment:                           - Prior to the procedure, a History and Physical                            was performed, and patient medications and                            allergies were reviewed. The patient's tolerance of                            previous anesthesia was also reviewed. The risks                            and benefits of the procedure and the sedation                            options and risks were discussed with the patient.                            All questions were answered, and informed consent                            was obtained. Prior Anticoagulants: The patient has                            taken no anticoagulant or antiplatelet agents. ASA                            Grade Assessment: III - A patient with severe                            systemic disease. After reviewing the risks and  benefits, the patient was deemed in satisfactory                            condition to undergo the procedure.                           After obtaining informed consent, the endoscope was                            passed under direct vision. Throughout the                            procedure, the patient's blood pressure, pulse, and                             oxygen saturations were monitored continuously. The                            [id number]-H190 ([id number]) Olympus endoscope was introduced                            through the mouth, and advanced to the third part                            of duodenum. The upper GI endoscopy was                            accomplished without difficulty. The patient                            tolerated the procedure well. Scope In: Scope Out: Findings:      There were esophageal mucosal changes suspicious for short-segment       Barrett's esophagus present in the lower third of the esophagus. There       were 2 tongues of salmon colored mucosa. The maximum longitudinal extent       of these mucosal changes was 1 cm in length. Mucosa was biopsied with a       cold forceps for histology in a targeted manner. One specimen bottle was       sent to pathology. Estimated blood loss was minimal.      A 3 cm hiatal hernia was present.      Mild inflammation characterized by congestion (edema) and erythema was       found in the gastric body and in the gastric antrum. Biopsies were taken       with a cold forceps for Helicobacter pylori testing. Estimated blood       loss was minimal. There was some apparent luminal distortion of the       pylorus, likely from prior PUD and gastric surgery. The pylorus was       otherwise patent and easily traversed.      Two small angioectasias with typical arborization were found in the       duodenal bulb and in the second portion of the duodenum. Coagulation for       hemostasis using argon plasma was successful. Estimated blood loss was  minimal.      Normal mucosa was found in the entire duodenum. Biopsies for histology       were taken with a cold forceps for evaluation of celiac disease.       Estimated blood loss was minimal. Impression:               - Esophageal mucosal changes suspicious for                            short-segment Barrett's  esophagus. Biopsied.                           - 3 cm hiatal hernia.                           - Gastritis. Biopsied.                           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 Treated with                            argon plasma coagulation (APC).                           - Normal mucosa was found in the entire examined                            duodenum. Biopsied. Recommendation:           - Continue present medications.                           - Await pathology results.                           - Repeat upper endoscopy PRN for retreatment.                           - Perform a colonoscopy today. Procedure Code(s):        --- Professional ---                           [phone], 59, Esophagogastroduodenoscopy, flexible,                            transoral; with control of bleeding, any method                           43239, Esophagogastroduodenoscopy, flexible,                            transoral; with biopsy, single or multiple Diagnosis Code(s):        --- Professional ---                           K22.89, Other specified disease of esophagus  K44.9, Diaphragmatic hernia without obstruction or                            gangrene                           K29.70, Gastritis, unspecified, without bleeding                           K31.819, Angiodysplasia of stomach and duodenum                            without bleeding                           D50.9, Iron deficiency anemia, unspecified CPT copyright 2022 American Medical Association. All rights reserved. The codes documented in this report are preliminary and upon coder review may  be revised to meet current compliance requirements. Doristine Locks, MD 07/16/2023 8:39:38 AM Number of Addenda: 0

## 2023-07-17 LAB — SURGICAL PATHOLOGY

## 2023-07-17 NOTE — Anesthesia Postprocedure Evaluation (Signed)
Anesthesia Post Note  Patient: [name]  Procedure(s) Performed: ESOPHAGOGASTRODUODENOSCOPY (EGD) WITH PROPOFOL COLONOSCOPY WITH PROPOFOL HOT HEMOSTASIS (ARGON PLASMA COAGULATION/BICAP) BIOPSY POLYPECTOMY     Patient location during evaluation: Endoscopy Anesthesia Type: MAC Level of consciousness: oriented, awake and alert and awake Pain management: pain level controlled Vital Signs Assessment: post-procedure vital signs reviewed and stable Respiratory status: spontaneous breathing, nonlabored ventilation, respiratory function stable and patient connected to nasal cannula oxygen Cardiovascular status: blood pressure returned to baseline and stable Postop Assessment: no headache, no backache and no apparent nausea or vomiting Anesthetic complications: no   No notable events documented.  Last Vitals:  Vitals:   07/16/23 0850 07/16/23 0856  BP: 135/82 126/81  Pulse: 73 72  Resp: 16 17  Temp:    SpO2: 92% 93%    Last Pain:  Vitals:   07/16/23 0856  TempSrc:   PainSc: 0-No pain                 Collene Schlichter

## 2023-07-18 ENCOUNTER — Encounter (HOSPITAL_COMMUNITY): Payer: Self-pay | Admitting: Gastroenterology

## 2023-07-22 ENCOUNTER — Other Ambulatory Visit: Payer: Self-pay | Admitting: Family Medicine

## 2023-07-23 NOTE — Telephone Encounter (Signed)
Requested medication (s) are due for refill today - yes  Requested medication (s) are on the active medication list -yes  Future visit scheduled -no  Last refill: 06/22/23 #30  Notes to clinic: non delegated Rx  Requested Prescriptions  Pending Prescriptions Disp Refills   clonazePAM (KLONOPIN) 0.5 MG tablet [Pharmacy Med Name: CLONAZEPAM 0.5 MG TABLET] 30 tablet 0    Sig: TAKE 1 TABLET (0.5 MG TOTAL) BY MOUTH 3 (THREE) TIMES DAILY AS NEEDED FOR ANXIETY.     Not Delegated - Psychiatry: Anxiolytics/Hypnotics 2 Failed - 07/22/2023  8:10 PM      Failed - This refill cannot be delegated      Failed - Urine Drug Screen completed in last 360 days      Failed - Valid encounter within last 6 months    Recent Outpatient Visits           1 year ago Onychomycosis   [facility], Priscille Heidelberg, MD   1 year ago Syncope, unspecified syncope type   [facility] Pickard, Priscille Heidelberg, MD   1 year ago Chest pain, unspecified type   [facility] Donita Brooks, MD   2 years ago Flank pain   [facility] Tanya Nones, Priscille Heidelberg, MD   2 years ago Benign essential HTN   [facility] Pickard, Priscille Heidelberg, MD              Passed - Patient is not pregnant         Requested Prescriptions  Pending Prescriptions Disp Refills   clonazePAM (KLONOPIN) 0.5 MG tablet [Pharmacy Med Name: CLONAZEPAM 0.5 MG TABLET] 30 tablet 0    Sig: TAKE 1 TABLET (0.5 MG TOTAL) BY MOUTH 3 (THREE) TIMES DAILY AS NEEDED FOR ANXIETY.     Not Delegated - Psychiatry: Anxiolytics/Hypnotics 2 Failed - 07/22/2023  8:10 PM      Failed - This refill cannot be delegated      Failed - Urine Drug Screen completed in last 360 days      Failed - Valid encounter within last 6 months    Recent Outpatient Visits           1 year ago Onychomycosis   [facility] Tanya Nones Priscille Heidelberg, MD   1 year ago Syncope, unspecified syncope type    [facility] Tanya Nones Priscille Heidelberg, MD   1 year ago Chest pain, unspecified type   [facility] Donita Brooks, MD   2 years ago Flank pain   [facility] Donita Brooks, MD   2 years ago Benign essential HTN   [facility] Pickard, Priscille Heidelberg, MD              Passed - Patient is not pregnant

## 2023-08-18 DIAGNOSIS — G4733 Obstructive sleep apnea (adult) (pediatric): Secondary | ICD-10-CM | POA: Diagnosis not present

## 2023-08-18 DIAGNOSIS — Z6834 Body mass index (BMI) 34.0-34.9, adult: Secondary | ICD-10-CM | POA: Diagnosis not present

## 2023-08-19 ENCOUNTER — Other Ambulatory Visit: Payer: Self-pay | Admitting: Dermatology

## 2023-08-19 DIAGNOSIS — L700 Acne vulgaris: Secondary | ICD-10-CM

## 2023-08-24 ENCOUNTER — Other Ambulatory Visit: Payer: Self-pay

## 2023-08-24 ENCOUNTER — Other Ambulatory Visit: Payer: Self-pay | Admitting: Family Medicine

## 2023-08-24 DIAGNOSIS — K271 Acute peptic ulcer, site unspecified, with perforation: Secondary | ICD-10-CM

## 2023-08-24 NOTE — Telephone Encounter (Signed)
Prescription Request  08/24/2023  LOV: 02/09/23  What is the name of the medication or equipment? pantoprazole (PROTONIX) 40 MG tablet [[id number]]  Have you contacted your pharmacy to request a refill? Yes   Which pharmacy would you like this sent to?  [id number] - [place], [place] - [address] AT [facility] [address] [place] [address] Phone: [phone] Fax: [phone]    Patient notified that their request is being sent to the clinical staff for review and that they should receive a response within 2 business days.   Please advise at [facility] [phone]

## 2023-08-25 MED ORDER — PANTOPRAZOLE SODIUM 40 MG PO TBEC
40.0000 mg | DELAYED_RELEASE_TABLET | Freq: Every day | ORAL | 1 refills | Status: DC
Start: 2023-08-25 — End: 2024-02-01

## 2023-08-25 NOTE — Telephone Encounter (Signed)
Requested Prescriptions  Pending Prescriptions Disp Refills   pantoprazole (PROTONIX) 40 MG tablet 90 tablet 1    Sig: Take 1 tablet (40 mg total) by mouth daily.     Gastroenterology: Proton Pump Inhibitors Failed - 08/24/2023 11:18 AM      Failed - Valid encounter within last 12 months    Recent Outpatient Visits           1 year ago Onychomycosis   [facility] Pickard, Priscille Heidelberg, MD   1 year ago Syncope, unspecified syncope type   [facility] Pickard, Priscille Heidelberg, MD   1 year ago Chest pain, unspecified type   [facility] Donita Brooks, MD   2 years ago Flank pain   [facility] Donita Brooks, MD   2 years ago Benign essential HTN   [facility] Pickard, Priscille Heidelberg, MD

## 2023-08-25 NOTE — Telephone Encounter (Signed)
Requested medications are due for refill today.  yes  Requested medications are on the active medications list.  yes  Last refill. 07/24/2023 #30 0 rf  Future visit scheduled.   no  Notes to clinic.  Refill not delegated.    Requested Prescriptions  Pending Prescriptions Disp Refills   clonazePAM (KLONOPIN) 0.5 MG tablet [Pharmacy Med Name: CLONAZEPAM 0.5 MG TABLET] 30 tablet 0    Sig: TAKE 1 TABLET (0.5 MG TOTAL) BY MOUTH 3 (THREE) TIMES DAILY AS NEEDED FOR ANXIETY.     Not Delegated - Psychiatry: Anxiolytics/Hypnotics 2 Failed - 08/24/2023  7:34 PM      Failed - This refill cannot be delegated      Failed - Urine Drug Screen completed in last 360 days      Failed - Valid encounter within last 6 months    Recent Outpatient Visits           1 year ago Onychomycosis   [facility] Tanya Nones Priscille Heidelberg, MD   1 year ago Syncope, unspecified syncope type   [facility] Tanya Nones Priscille Heidelberg, MD   1 year ago Chest pain, unspecified type   [facility] Donita Brooks, MD   2 years ago Flank pain   [facility] Donita Brooks, MD   2 years ago Benign essential HTN   [facility] Pickard, Priscille Heidelberg, MD              Passed - Patient is not pregnant

## 2023-08-26 ENCOUNTER — Other Ambulatory Visit: Payer: Self-pay | Admitting: Dermatology

## 2023-08-26 DIAGNOSIS — L700 Acne vulgaris: Secondary | ICD-10-CM

## 2023-08-28 NOTE — Telephone Encounter (Signed)
Left message to return call on patient's voicemail; med refill appointment needed. LOV 02/09/23.

## 2023-08-31 ENCOUNTER — Other Ambulatory Visit: Payer: Self-pay | Admitting: Family Medicine

## 2023-08-31 DIAGNOSIS — I10 Essential (primary) hypertension: Secondary | ICD-10-CM

## 2023-09-01 ENCOUNTER — Ambulatory Visit: Payer: PPO | Admitting: Dermatology

## 2023-09-01 VITALS — BP 155/85

## 2023-09-01 DIAGNOSIS — B351 Tinea unguium: Secondary | ICD-10-CM

## 2023-09-01 DIAGNOSIS — L719 Rosacea, unspecified: Secondary | ICD-10-CM

## 2023-09-01 DIAGNOSIS — L709 Acne, unspecified: Secondary | ICD-10-CM

## 2023-09-01 DIAGNOSIS — L700 Acne vulgaris: Secondary | ICD-10-CM

## 2023-09-01 MED ORDER — AMLODIPINE BESYLATE 10 MG PO TABS: 10.0000 mg | ORAL_TABLET | Freq: Every day | ORAL | 0 refills | Status: DC

## 2023-09-01 MED ORDER — CLONAZEPAM 0.5 MG PO TABS: 0.5000 mg | ORAL_TABLET | Freq: Three times a day (TID) | ORAL | 0 refills | Status: DC | PRN

## 2023-09-01 NOTE — Progress Notes (Signed)
   Follow-Up Visit   Subjective  [name] is a 57 y.o. male who presents for the following: Biopsy follow up of left 3rd finger - +budding yeast - he is treating with vinegar soaks once daily and it seems to be improving.  Acne follow up - He is treating with Clindamycin qam, Tretinoin 0.25% every other night and Doxycycline 100 mg 1 po every day. He said he has seen an improvement.    The following portions of the chart were reviewed this encounter and updated as appropriate: medications, allergies, medical history  Review of Systems:  No other skin or systemic complaints except as noted in HPI or Assessment and Plan.  Objective  Well appearing patient in no apparent distress; mood and affect are within normal limits.  A focused examination was performed of the following areas:   Relevant exam findings are noted in the Assessment and Plan.  Pathology Report Reviewed with Patient Diagnosis Skin , left 3rd finger nail plate BUDDING YEAST PRESENT, SEE DESCRIPTION Microscopic Description Nail fragments stained with PAS reveal budding yeast, but no fungal hyphae.    Assessment & Plan   1. Onychomycosis (yeast) - Assessment: Patient reports improvement in the affected nail with daily vinegar soaks. Pathology report confirms the presence of budding yeast but no fungus. - Plan: Continue daily vinegar soaks for the nail, ensuring proper rinsing afterward. Monitor the nail growth progress and expect full growth within a year.  2. Acne and Rosacea - Assessment: Patient's facial skin appears smoother with the current treatment regimen. No reported issues with dryness or side effects from medications. - Plan: Continue doxycycline 100mg  for 3 more months, then reevaluate for possible dose reduction to 50mg . Continue clindamycin every morning and tretinoin every other night. Encourage the use of a moisturizer as needed, especially during colder months. Allow for flexibility in  tretinoin application frequency if skin becomes too dry or irritated.  Follow-up as needed: - Schedule a follow-up appointment in March to assess progress and adjust treatment as needed. - Encourage the patient to return earlier if refills or additional support are required. - Provide the patient with samples of a new moisturizer to try.   Acne vulgaris  Related Medications doxycycline (VIBRA-TABS) 100 MG tablet Take 1 tablet (100 mg total) by mouth daily. TAKE WITH HEAVY MEAL, AVOID ALCOHOL  mupirocin ointment (BACTROBAN) 2 % APPLY TO AFFECTED AREA TWICE A DAY  clindamycin (CLEOCIN T) 1 % SWAB APPLY TO AFFECTED AREA TWICE A DAY  tretinoin (RETIN-A) 0.025 % cream APPLY TOPICALLY AT BEDTIME. APPLY M-W-F NIGHTS  Onychomycosis    Return in about 5 months (around 01/30/2024) for Acne and Nail follow up.  I, Joanie Coddington, CMA, am acting as scribe for [employer], DO .   Documentation: I have reviewed the above documentation for accuracy and completeness, and I agree with the above.  Langston Reusing, DO

## 2023-09-01 NOTE — Patient Instructions (Signed)
Hello [place],  Thank you for visiting [place] today. We appreciate your dedication to improving your health and are pleased to see the progress you've made. Here is a summary of the key instructions from today's consultation:  - Vinegar Soaks: Continue daily vinegar soaks for your nail. Ensure to rinse off the vinegar afterward to prevent skin irritation.  - Medication Regimen: Maintain your current medication regimen:   - Doxycycline: Continue for your face for three more months. We will consider reducing the dosage from 100 mg to 50 mg at your next visit.   - Clindamycin: Apply every morning.   - Tretinoin: Use every other night. Adjust usage to two nights a week if your skin becomes too dry or irritated during colder months.  - Moisturizer: Use a good moisturizer to manage any dryness, especially as the weather changes.  - Follow-Up: Appointment scheduled for March to reassess your treatment plan.  Additional Notes: - We have provided samples of a new moisturizer for you to try. - Updated photographs of your nail and face have been taken for our records.  Please feel free to contact us if you need any refills or have any concerns before your next appointment.  Best regards,  Dr. [facility]   Important Information  Due to recent changes in healthcare laws, you may see results of your pathology and/or laboratory studies on MyChart before the doctors have had a chance to review them. We understand that in some cases there may be results that are confusing or concerning to you. Please understand that not all results are received at the same time and often the doctors may need to interpret multiple results in order to provide you with the best plan of care or course of treatment. Therefore, we ask that you please give [place] 2 business days to thoroughly review all your results before contacting the office for clarification. Should we see a critical lab result, you will be contacted  sooner.   If You Need Anything After Your Visit  If you have any questions or concerns for your doctor, please call our main line at [phone] If no one answers, please leave a voicemail as directed and we will return your call as soon as possible. Messages left after 4 pm will be answered the following business day.   You may also send [place] a message via MyChart. We typically respond to MyChart messages within 1-2 business days.  For prescription refills, please ask your pharmacy to contact our office. Our fax number is [phone].  If you have an urgent issue when the clinic is closed that cannot wait until the next business day, you can page your doctor at the number below.    Please note that while we do our best to be available for urgent issues outside of office hours, we are not available 24/7.   If you have an urgent issue and are unable to reach [place], you may choose to seek medical care at your doctor's office, retail clinic, urgent care center, or emergency room.  If you have a medical emergency, please immediately call 911 or go to the emergency department. In the event of inclement weather, please call our main line at [phone] for an update on the status of any delays or closures.  Dermatology Medication Tips: Please keep the boxes that topical medications come in in order to help keep track of the instructions about where and how to use these. Pharmacies typically print the medication instructions  only on the boxes and not directly on the medication tubes.   If your medication is too expensive, please contact our office at [phone] or send [place] a message through MyChart.   We are unable to tell what your co-pay for medications will be in advance as this is different depending on your insurance coverage. However, we may be able to find a substitute medication at lower cost or fill out paperwork to get insurance to cover a needed medication.   If a prior authorization is  required to get your medication covered by your insurance company, please allow [place] 1-2 business days to complete this process.  Drug prices often vary depending on where the prescription is filled and some pharmacies may offer cheaper prices.  The website www.goodrx.com contains coupons for medications through different pharmacies. The prices here do not account for what the cost may be with help from insurance (it may be cheaper with your insurance), but the website can give you the price if you did not use any insurance.  - You can print the associated coupon and take it with your prescription to the pharmacy.  - You may also stop by our office during regular business hours and pick up a GoodRx coupon card.  - If you need your prescription sent electronically to a different pharmacy, notify our office through [facility] or by phone at [phone]

## 2023-09-07 ENCOUNTER — Encounter: Payer: Self-pay | Admitting: Dermatology

## 2023-09-08 ENCOUNTER — Ambulatory Visit (INDEPENDENT_AMBULATORY_CARE_PROVIDER_SITE_OTHER): Payer: PPO | Admitting: Family Medicine

## 2023-09-08 ENCOUNTER — Encounter: Payer: Self-pay | Admitting: Family Medicine

## 2023-09-08 VITALS — BP 124/72 | HR 84 | Temp 98.1°F | Ht 68.0 in | Wt 224.0 lb

## 2023-09-08 DIAGNOSIS — Z23 Encounter for immunization: Secondary | ICD-10-CM | POA: Diagnosis not present

## 2023-09-08 DIAGNOSIS — E7800 Pure hypercholesterolemia, unspecified: Secondary | ICD-10-CM

## 2023-09-08 NOTE — Progress Notes (Signed)
Subjective:    Patient ID: [name], male    DOB: Feb 28, 1966, 57 y.o.   MRN: [id number]  Patient is here today for checkup.  I was reviewing his past medical history.  We screen for prostate cancer with a PSA in March.  His colonoscopy was performed earlier this year and showed a tubular.  He did have an ED 1 pill (August.  His current Protonix and I got GI regularly.  Unfortunately he had elevated liver function test in May.  At that time GI did check an ANA which was positive.  Titers were elevated at 1:320.  Anti-double-stranded DNA was also positive.  Antimitochondrial antibodies were negative however.  For this reason, GI made a referral to rheumatology.  The patient has an appointment soon with rheumatologist.  Patient does admit to drinking 6 beers a day.  I believe that his elevated liver function test are likely due to alcohol use.  I do not believe that he has autoimmune hepatitis however his ANA is significantly elevated.  We discussed this at length I tried to answer his questions as best I can.  He is due for a flu shot and he would like to receive that today.  His blood pressure is excellent.  He is due to recheck his cholesterol. Past Medical History:  Diagnosis Date   Allergy    Anemia    Colonic diverticular abscess 12/26/2014   Diverticulitis    ETOH abuse    GAD (generalized anxiety disorder)    GERD (gastroesophageal reflux disease)    History of fainting spells of unknown cause    Hypercholesteremia    Hypertension    Left rotator cuff tear 02/08/2019   Sleep apnea    Superior labrum anterior-to-posterior (SLAP) tear of left shoulder 02/08/2019   Tobacco abuse    Past Surgical History:  Procedure Laterality Date   APPENDECTOMY N/A 01/01/2015   Procedure: INCIDENTAL APPENDECTOMY;  Surgeon: Dalia Heading, MD;  Location: [facility];  Service: General;  Laterality: N/A;   BACK SURGERY     lower back, fusion   BIOPSY  07/16/2023   Procedure: BIOPSY;  Surgeon:  Shellia Cleverly, DO;  Location: [facility];  Service: Gastroenterology;;   COLONOSCOPY WITH PROPOFOL N/A 12/21/2017   Procedure: COLONOSCOPY WITH PROPOFOL;  Surgeon: Corbin Ade, MD;  Location: [facility];  Service: Endoscopy;  Laterality: N/A;  9:45am   COLONOSCOPY WITH PROPOFOL N/A 07/16/2023   Procedure: COLONOSCOPY WITH PROPOFOL;  Surgeon: Shellia Cleverly, DO;  Location: [facility];  Service: Gastroenterology;  Laterality: N/A;   ESOPHAGOGASTRODUODENOSCOPY (EGD) WITH PROPOFOL N/A 07/16/2023   Procedure: ESOPHAGOGASTRODUODENOSCOPY (EGD) WITH PROPOFOL;  Surgeon: Shellia Cleverly, DO;  Location: [facility];  Service: Gastroenterology;  Laterality: N/A;   GASTRECTOMY N/A 04/14/2016   Procedure: Ferrel Logan;  Surgeon: Franky Macho, MD;  Location: [facility];  Service: General;  Laterality: N/A;   HEMORRHOID SURGERY     HERNIA REPAIR     as child   HOT HEMOSTASIS N/A 07/16/2023   Procedure: HOT HEMOSTASIS (ARGON PLASMA COAGULATION/BICAP);  Surgeon: Shellia Cleverly, DO;  Location[facility];  Service: Gastroenterology;  Laterality: N/A;   LOOP RECORDER INSERTION N/A 09/06/2021   Procedure: LOOP RECORDER INSERTION;  Surgeon: Thurmon Fair, MD;  Location: [facility];  Service: Cardiovascular;  Laterality: N/A;   PARTIAL COLECTOMY N/A 01/01/2015   Procedure: PARTIAL COLECTOMY;  Surgeon: Dalia Heading, MD;  Location: [facility];  Service: General;  Laterality:  N/A;   POLYPECTOMY  12/21/2017   Procedure: POLYPECTOMY;  Surgeon: Corbin Ade, MD;  Location: [facility];  Service: Endoscopy;;  colon   POLYPECTOMY  07/16/2023   Procedure: POLYPECTOMY;  Surgeon: Shellia Cleverly, DO;  Location: [facility];  Service: Gastroenterology;;   ROTATOR CUFF REPAIR Bilateral    SHOULDER ARTHROSCOPY WITH ROTATOR CUFF REPAIR AND SUBACROMIAL DECOMPRESSION Left 02/09/2019   Procedure: SHOULDER ARTHROSCOPY WITH ROTATOR CUFF REPAIR AND SUBACROMIAL PARTIAL ACROMIOPLASTY, DISTAL  CLAVICULECTOMY, AND EXTENSIVE DEBRIDEMENT;  Surgeon: Salvatore Marvel, MD;  Location: [facility];  Service: Orthopedics;  Laterality: Left;   Current Outpatient Medications on File Prior to Visit  Medication Sig Dispense Refill   amLODipine (NORVASC) 10 MG tablet Take 1 tablet (10 mg total) by mouth daily. 90 tablet 0   atorvastatin (LIPITOR) 40 MG tablet TAKE 1 TABLET BY MOUTH EVERY DAY 90 tablet 1   carvedilol (COREG) 12.5 MG tablet Take 1 tablet (12.5 mg total) by mouth 2 (two) times daily with a meal. 180 tablet 3   clindamycin (CLEOCIN T) 1 % SWAB APPLY TO AFFECTED AREA TWICE A DAY 60 each 3   clonazePAM (KLONOPIN) 0.5 MG tablet Take 1 tablet (0.5 mg total) by mouth 3 (three) times daily as needed for anxiety. 30 tablet 0   cyclobenzaprine (FLEXERIL) 10 MG tablet TAKE 1 TABLET BY MOUTH EVERYDAY AT BEDTIME (Patient taking differently: Take 10 mg by mouth at bedtime.) 30 tablet 2   doxycycline (VIBRA-TABS) 100 MG tablet Take 1 tablet (100 mg total) by mouth daily. TAKE WITH HEAVY MEAL, AVOID ALCOHOL 30 tablet 3   fluticasone (FLONASE) 50 MCG/ACT nasal spray SPRAY 2 SPRAYS INTO EACH NOSTRIL EVERY DAY 48 mL 3   losartan (COZAAR) 50 MG tablet Take 1 tablet (50 mg total) by mouth daily. 90 tablet 3   Menthol (BIOFREEZE) 10.5 % AERO Apply 1-2 sprays topically as needed (pain).     mupirocin ointment (BACTROBAN) 2 % APPLY TO AFFECTED AREA TWICE A DAY 22 g 1   pantoprazole (PROTONIX) 40 MG tablet Take 1 tablet (40 mg total) by mouth daily. 90 tablet 1   sildenafil (VIAGRA) 100 MG tablet TAKE 1 TABLET BY MOUTH EVERY DAY AS NEEDED FOR ERECTILE DYSFUNCTION 10 tablet 11   tretinoin (RETIN-A) 0.025 % cream APPLY TOPICALLY AT BEDTIME. APPLY M-W-F NIGHTS 45 g 3   venlafaxine XR (EFFEXOR-XR) 150 MG 24 hr capsule TAKE 1 CAPSULE BY MOUTH EVERY DAY IN THE MORNING. 90 capsule 1   No current facility-administered medications on file prior to visit.   No Known Allergies Social History    Socioeconomic History   Marital status: Married    Spouse name: Not on file   Number of children: Not on file   Years of education: Not on file   Highest education level: Associate degree: occupational, Scientist, product/process development, or vocational program  Occupational History   Not on file  Tobacco Use   Smoking status: Every Day    Current packs/day: 1.00    Average packs/day: 1 pack/day for 35.0 years (35.0 ttl pk-yrs)    Types: Cigarettes    Passive exposure: Current (Wife is a smoker)   Smokeless tobacco: Never   Tobacco comments:    1.5 ppd since age 25.   Vaping Use   Vaping status: Never Used  Substance and Sexual Activity   Alcohol use: Yes    Alcohol/week: 48.0 standard drinks of alcohol    Types: 48 Cans of beer per week  Comment: patient states "about 5 or 6 a day" (beers)   Drug use: No   Sexual activity: Yes  Other Topics Concern   Not on file  Social History Narrative   Not on file   Social Determinants of Health   Financial Resource Strain: Low Risk  (09/04/2023)   Overall Financial Resource Strain (CARDIA)    Difficulty of Paying Living Expenses: Not very hard  Food Insecurity: No Food Insecurity (09/04/2023)   Hunger Vital Sign    Worried About Running Out of Food in the Last Year: Never true    Ran Out of Food in the Last Year: Never true  Transportation Needs: No Transportation Needs (09/04/2023)   PRAPARE - Administrator, Civil Service (Medical): No    Lack of Transportation (Non-Medical): No  Physical Activity: Insufficiently Active (09/04/2023)   Exercise Vital Sign    Days of Exercise per Week: 3 days    Minutes of Exercise per Session: 20 min  Stress: No Stress Concern Present (09/04/2023)   [employer] of Occupational Health - Occupational Stress Questionnaire    Feeling of Stress : Only a little  Social Connections: Unknown (09/04/2023)   Social Connection and Isolation Panel [NHANES]    Frequency of Communication with Friends and  Family: Three times a week    Frequency of Social Gatherings with Friends and Family: Three times a week    Attends Religious Services: Patient declined    Active Member of Clubs or Organizations: No    Attends Banker Meetings: Not on file    Marital Status: Married  Intimate Partner Violence: Not At Risk (10/14/2022)   Humiliation, Afraid, Rape, and Kick questionnaire    Fear of Current or Ex-Partner: No    Emotionally Abused: No    Physically Abused: No    Sexually Abused: No      Review of Systems  All other systems reviewed and are negative.      Objective:   Physical Exam Vitals reviewed.  Constitutional:      Appearance: He is well-developed.  Eyes:     Conjunctiva/sclera: Conjunctivae normal.     Pupils: Pupils are equal, round, and reactive to light.  Cardiovascular:     Rate and Rhythm: Normal rate and regular rhythm.     Heart sounds: Normal heart sounds. No murmur heard. Pulmonary:     Effort: Pulmonary effort is normal. No respiratory distress.     Breath sounds: Normal breath sounds. No wheezing or rales.  Abdominal:     General: Bowel sounds are normal. There is no distension.     Palpations: Abdomen is soft.     Tenderness: There is no abdominal tenderness. There is no guarding or rebound.  Musculoskeletal:     Cervical back: Neck supple.  Lymphadenopathy:     Cervical: No cervical adenopathy.  Neurological:     Mental Status: He is alert and oriented to person, place, and time.     Cranial Nerves: No cranial nerve deficit.     Motor: No abnormal muscle tone.     Coordination: Coordination normal.     Deep Tendon Reflexes: Reflexes are normal and symmetric.           Assessment & Plan:   Pure hypercholesterolemia - Plan: CBC with Differential/Platelet, COMPLETE METABOLIC PANEL WITH GFR, Lipid panel I am very happy with his blood pressure.  Strongly recommended smoking cessation.  Cancer screening is up-to-date.  I will check a  CBC a CMP and a lipid panel.  Goal LDL cholesterol is less than 100.  We discussed the implications of the elevated ANA and positive double-stranded DNA antibodies.  I explained to the patient that this does not diagnose lupus.  Instead lupus constellation of symptoms and laboratory findings.  I do not believe that he has autoimmune hepatitis due to his other lab findings.  I will defer to rheumatology's assessment regarding systemic lupus.  Recommended regular follow-up with GI regarding his Barrett's esophagus.  Recommended cessation of alcohol

## 2023-09-09 LAB — CBC WITH DIFFERENTIAL/PLATELET
Absolute Lymphocytes: 1890 {cells}/uL (ref 850–3900)
Absolute Monocytes: 1480 {cells}/uL — ABNORMAL HIGH (ref 200–950)
Basophils Absolute: 100 {cells}/uL (ref 0–200)
Basophils Relative: 1 %
Eosinophils Absolute: 260 {cells}/uL (ref 15–500)
Eosinophils Relative: 2.6 %
HCT: 49.7 % (ref 38.5–50.0)
Hemoglobin: 16.8 g/dL (ref 13.2–17.1)
MCH: 32.8 pg (ref 27.0–33.0)
MCHC: 33.8 g/dL (ref 32.0–36.0)
MCV: 97.1 fL (ref 80.0–100.0)
MPV: 10.2 fL (ref 7.5–12.5)
Monocytes Relative: 14.8 %
Neutro Abs: 6270 {cells}/uL (ref 1500–7800)
Neutrophils Relative %: 62.7 %
Platelets: 280 10*3/uL (ref 140–400)
RBC: 5.12 10*6/uL (ref 4.20–5.80)
RDW: 13.3 % (ref 11.0–15.0)
Total Lymphocyte: 18.9 %
WBC: 10 10*3/uL (ref 3.8–10.8)

## 2023-09-09 LAB — COMPLETE METABOLIC PANEL WITH GFR
AG Ratio: 1.6 (calc) (ref 1.0–2.5)
ALT: 85 U/L — ABNORMAL HIGH (ref 9–46)
AST: 78 U/L — ABNORMAL HIGH (ref 10–35)
Albumin: 4.2 g/dL (ref 3.6–5.1)
Alkaline phosphatase (APISO): 105 U/L (ref 35–144)
BUN: 10 mg/dL (ref 7–25)
CO2: 28 mmol/L (ref 20–32)
Calcium: 10 mg/dL (ref 8.6–10.3)
Chloride: 99 mmol/L (ref 98–110)
Creat: 0.78 mg/dL (ref 0.70–1.30)
Globulin: 2.7 g/dL (ref 1.9–3.7)
Glucose, Bld: 118 mg/dL — ABNORMAL HIGH (ref 65–99)
Potassium: 4.2 mmol/L (ref 3.5–5.3)
Sodium: 137 mmol/L (ref 135–146)
Total Bilirubin: 0.5 mg/dL (ref 0.2–1.2)
Total Protein: 6.9 g/dL (ref 6.1–8.1)
eGFR: 103 mL/min/{1.73_m2} (ref >=60)

## 2023-09-09 LAB — LIPID PANEL
Cholesterol: 174 mg/dL (ref <200)
HDL: 53 mg/dL (ref >=40)
LDL Cholesterol (Calc): 98 mg/dL
Non-HDL Cholesterol (Calc): 121 mg/dL (ref <130)
Total CHOL/HDL Ratio: 3.3 (calc) (ref <5.0)
Triglycerides: 132 mg/dL (ref <150)

## 2023-09-10 ENCOUNTER — Encounter: Payer: Self-pay | Admitting: Internal Medicine

## 2023-09-10 ENCOUNTER — Ambulatory Visit: Payer: PPO | Attending: Internal Medicine | Admitting: Internal Medicine

## 2023-09-10 VITALS — BP 129/82 | HR 73 | Resp 14 | Ht 69.0 in | Wt 225.0 lb

## 2023-09-10 DIAGNOSIS — F1010 Alcohol abuse, uncomplicated: Secondary | ICD-10-CM

## 2023-09-10 DIAGNOSIS — R768 Other specified abnormal immunological findings in serum: Secondary | ICD-10-CM

## 2023-09-10 DIAGNOSIS — R7989 Other specified abnormal findings of blood chemistry: Secondary | ICD-10-CM | POA: Diagnosis not present

## 2023-09-10 NOTE — Progress Notes (Unsigned)
Office Visit Note  Patient: [name]             Date of Birth: Feb 05, 1966           MRN: [id number]             PCP: Donita Brooks, MD Referring: De Blanch Visit Date: 09/10/2023 Occupation: Disabled, former Lobbyist  Subjective:  New Patient (Initial Visit) (Patient states he has pain in his back and both shoulders. )   History of Present Illness: [name] is a 57 y.o. male here for evaluation of positive ANA this was initially checked in association abnormal liver function test with ALT and AST elevation.  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  He had previous lumbar spine surgery by Dr. Channing Mutters in [place] about 8 years ago, and saw [facility] with treatments including sacroiliac joint injections also had bilateral rotator cuff surgery and had left shoulder arthroscopy for subacromial decompression and partial claviculectomy 2020. ***  Low back B/l shoulders  ***  Labs reviewed 08/2023 AST 78 ALT 85  03/2023 ANA 1:320 nucleolar dsDNA 7 AMA neg Scl-70, Sm, RNP, SSA, SSB neg AST 74 ALT 86 HCV Ab pos RNA neg HBV neg  Imaging reviewed 04/21/23 US Abdomen Complete IMPRESSION: 1. Diffuse increased echogenicity in the liver is nonspecific but often due to hepatic steatosis. 2. The proximal abdominal aorta measures 3.1 cm in AP dimension. Recommend follow-up ultrasound every 3 years. 3. No other abnormalities.  Activities of Daily Living:  Patient reports morning stiffness for 0 minute.   Patient Reports nocturnal pain.  Difficulty dressing/grooming: Denies Difficulty climbing stairs: Denies Difficulty getting out of chair: Denies Difficulty using hands for taps, buttons, cutlery, and/or writing: Denies  Review of Systems  Constitutional:  Negative for fatigue.   HENT:  Negative for mouth sores and mouth dryness.   Eyes:  Positive for dryness.  Respiratory:  Positive for shortness of breath.   Cardiovascular:  Negative for chest pain and palpitations.  Gastrointestinal:  Negative for blood in stool, constipation and diarrhea.  Endocrine: Negative for increased urination.  Genitourinary:  Negative for involuntary urination.  Musculoskeletal:  Negative for joint pain, gait problem, joint pain, joint swelling, myalgias, muscle weakness, morning stiffness, muscle tenderness and myalgias.  Skin:  Positive for sensitivity to sunlight. Negative for color change, rash and hair loss.  Allergic/Immunologic: Negative for susceptible to infections.  Neurological:  Positive for headaches. Negative for dizziness.  Hematological:  Negative for swollen glands.  Psychiatric/Behavioral:  Positive for sleep disturbance. Negative for depressed mood. The patient is nervous/anxious.     PMFS History:  Patient Active Problem List   Diagnosis Date Noted   Positive ANA (antinuclear antibody) 09/10/2023   OSA (obstructive sleep apnea) 08/18/2023   BMI 34.0-34.9,adult 08/18/2023   Adenomatous polyp of descending colon 07/16/2023   Rectal polyp 07/16/2023   Diverticulosis of colon without hemorrhage 07/16/2023   Internal hemorrhoids 07/16/2023   Gastritis and gastroduodenitis 07/16/2023   AVM (arteriovenous malformation) of small bowel, acquired 07/16/2023   Hiatal hernia 07/16/2023   Gastroesophageal reflux disease without esophagitis 07/16/2023   IDA (iron deficiency anemia) 03/09/2023   Rectal bleeding 03/09/2023   Elevated LFTs 03/09/2023   H/O adenomatous polyp of colon 03/09/2023   Syncope 09/05/2021   Chest pain, rule out acute myocardial infarction 09/05/2021  Left rotator cuff tear 02/08/2019   Superior labrum anterior-to-posterior (SLAP) tear of left shoulder 02/08/2019   Heme + stool 09/17/2017   Hemorrhoids 09/17/2017   Peptic ulcer, acute with  perforation (HCC) 04/14/2016   Disorder of appendix 12/31/2014   Diverticulitis of colon with perforation 12/30/2014   Colonic diverticular abscess 12/26/2014   ETOH abuse 12/26/2014   Acute colitis 12/08/2014   Hyperglycemia 12/08/2014   Obesity 12/08/2014   Colitis 12/08/2014   GAD (generalized anxiety disorder)    Atypical chest pain 02/13/2013   Tobacco abuse 02/13/2013   Other and unspecified hyperlipidemia 02/13/2013   Allergy    Hypertension    Anxiety state 05/25/2009   Essential hypertension 05/25/2009   Osteoarthritis 05/25/2009    Past Medical History:  Diagnosis Date   Allergy    Anemia    Colonic diverticular abscess 12/26/2014   Diverticulitis    ETOH abuse    GAD (generalized anxiety disorder)    GERD (gastroesophageal reflux disease)    History of fainting spells of unknown cause    Hypercholesteremia    Hypertension    Left rotator cuff tear 02/08/2019   Sleep apnea    Superior labrum anterior-to-posterior (SLAP) tear of left shoulder 02/08/2019   Tobacco abuse     Family History  Problem Relation Age of Onset   Diabetes Mother    Stroke Mother    Stroke Father    CAD Father    Colon cancer Neg Hx    Past Surgical History:  Procedure Laterality Date   APPENDECTOMY N/A 01/01/2015   Procedure: INCIDENTAL APPENDECTOMY;  Surgeon: Dalia Heading, MD;  Location: [facility];  Service: General;  Laterality: N/A;   BACK SURGERY     lower back, fusion   BIOPSY  07/16/2023   Procedure: BIOPSY;  Surgeon: Shellia Cleverly, DO;  Location: [facility];  Service: Gastroenterology;;   COLONOSCOPY WITH PROPOFOL N/A 12/21/2017   Procedure: COLONOSCOPY WITH PROPOFOL;  Surgeon: Corbin Ade, MD;  Location: [facility];  Service: Endoscopy;  Laterality: N/A;  9:45am   COLONOSCOPY WITH PROPOFOL N/A 07/16/2023   Procedure: COLONOSCOPY WITH PROPOFOL;  Surgeon: Shellia Cleverly, DO;  Location: [facility];  Service: Gastroenterology;  Laterality: N/A;    ESOPHAGOGASTRODUODENOSCOPY (EGD) WITH PROPOFOL N/A 07/16/2023   Procedure: ESOPHAGOGASTRODUODENOSCOPY (EGD) WITH PROPOFOL;  Surgeon: Shellia Cleverly, DO;  Location: [facility];  Service: Gastroenterology;  Laterality: N/A;   GASTRECTOMY N/A 04/14/2016   Procedure: Ferrel Logan;  Surgeon: Franky Macho, MD;  Location: [facility];  Service: General;  Laterality: N/A;   HEMORRHOID SURGERY     HERNIA REPAIR     as child   HOT HEMOSTASIS N/A 07/16/2023   Procedure: HOT HEMOSTASIS (ARGON PLASMA COAGULATION/BICAP);  Surgeon: Shellia Cleverly, DO;  Location: [facility];  Service: Gastroenterology;  Laterality: N/A;   LOOP RECORDER INSERTION N/A 09/06/2021   Procedure: LOOP RECORDER INSERTION;  Surgeon: Thurmon Fair, MD;  [facility];  Service: Cardiovascular;  Laterality: N/A;   PARTIAL COLECTOMY N/A 01/01/2015   Procedure: PARTIAL COLECTOMY;  Surgeon: Dalia Heading, MD;  Location: [facility];  Service: General;  Laterality: N/A;   POLYPECTOMY  12/21/2017   Procedure: POLYPECTOMY;  Surgeon: Corbin Ade, MD;  [facility];  Service: Endoscopy;;  colon   POLYPECTOMY  07/16/2023   Procedure: POLYPECTOMY;  Surgeon: Shellia Cleverly, DO;  Location[facility];  Service: Gastroenterology;;   ROTATOR CUFF REPAIR Bilateral    SHOULDER  ARTHROSCOPY WITH ROTATOR CUFF REPAIR AND SUBACROMIAL DECOMPRESSION Left 02/09/2019   Procedure: SHOULDER ARTHROSCOPY WITH ROTATOR CUFF REPAIR AND SUBACROMIAL PARTIAL ACROMIOPLASTY, DISTAL CLAVICULECTOMY, AND EXTENSIVE DEBRIDEMENT;  Surgeon: Salvatore Marvel, MD;  Location: [facility];  Service: Orthopedics;  Laterality: Left;   Social History   Social History Narrative   Not on file   Immunization History  Administered Date(s) Administered   Influenza Split 12/02/2012   Influenza, Seasonal, Injecte, Preservative Fre 09/08/2023   Influenza,inj,quad, With Preservative 08/24/2017   Moderna Sars-Covid-2 Vaccination 06/05/2020,  07/02/2020, 01/23/2021   PNEUMOCOCCAL CONJUGATE-20 02/09/2023   Pneumococcal Polysaccharide-23 12/09/2014   Td 09/01/2006   Td,absorbed, Preservative Free, Adult Use, Lf Unspecified 09/01/2006     Objective: Vital Signs: BP 129/82 (BP Location: Right Arm, Patient Position: Sitting, Cuff Size: Normal)   Pulse 73   Resp 14   Ht 5\' 9"  (1.753 m)   Wt 225 lb (102.1 kg)   BMI 33.23 kg/m    Physical Exam   Musculoskeletal Exam: ***  CDAI Exam: CDAI Score: -- Patient Global: --; Provider Global: -- Swollen: --; Tender: -- Joint Exam 09/10/2023   No joint exam has been documented for this visit   There is currently no information documented on the homunculus. Go to the Rheumatology activity and complete the homunculus joint exam.  Investigation: No additional findings.  Imaging: No results found.  Recent Labs: Lab Results  Component Value Date   WBC 10.0 09/08/2023   HGB 16.8 09/08/2023   PLT 280 09/08/2023   NA 137 09/08/2023   K 4.2 09/08/2023   CL 99 09/08/2023   CO2 28 09/08/2023   GLUCOSE 118 (H) 09/08/2023   BUN 10 09/08/2023   CREATININE 0.78 09/08/2023   BILITOT 0.5 09/08/2023   ALKPHOS 74 04/13/2023   AST 78 (H) 09/08/2023   ALT 85 (H) 09/08/2023   PROT 6.9 09/08/2023   ALBUMIN 3.9 04/13/2023   CALCIUM 10.0 09/08/2023   GFRAA 117 01/31/2021    Speciality Comments: No specialty comments available.  Procedures:  No procedures performed Allergies: Patient has no known allergies.   Assessment / Plan:     Visit Diagnoses: Positive ANA (antinuclear antibody) - Plan: Anti-DNA antibody, double-stranded, C3 and C4, Sedimentation rate, CK  ETOH abuse  Elevated LFTs  Orders: Orders Placed This Encounter  Procedures   Anti-DNA antibody, double-stranded   C3 and C4   Sedimentation rate   CK   No orders of the defined types were placed in this encounter.   Face-to-face time spent with patient was *** minutes. Greater than 50% of time was spent in  counseling and coordination of care.  Follow-Up Instructions: No follow-ups on file.   Fuller Plan, MD  Note - This record has been created using [employer].  Chart creation errors have been sought, but may not always  have been located. Such creation errors do not reflect on  the standard of medical care.

## 2023-09-11 DIAGNOSIS — G4733 Obstructive sleep apnea (adult) (pediatric): Secondary | ICD-10-CM | POA: Diagnosis not present

## 2023-09-11 LAB — C3 AND C4
C3 Complement: 131 mg/dL (ref 82–185)
C4 Complement: 23 mg/dL (ref 15–53)

## 2023-09-11 LAB — SEDIMENTATION RATE: Sed Rate: 2 mm/h (ref 0–20)

## 2023-09-11 LAB — ANTI-DNA ANTIBODY, DOUBLE-STRANDED: ds DNA Ab: 7 [IU]/mL — ABNORMAL HIGH

## 2023-09-11 LAB — CK: Total CK: 48 U/L (ref 44–196)

## 2023-09-28 ENCOUNTER — Other Ambulatory Visit: Payer: Self-pay | Admitting: Dermatology

## 2023-09-28 DIAGNOSIS — L700 Acne vulgaris: Secondary | ICD-10-CM

## 2023-09-30 ENCOUNTER — Other Ambulatory Visit: Payer: Self-pay | Admitting: Family Medicine

## 2023-10-01 NOTE — Telephone Encounter (Signed)
Requested medications are due for refill today.  yes  Requested medications are on the active medications list.  yes  Last refill. 09/01/2023 #30 0 rf  Future visit scheduled.   no  Notes to clinic.  Refill not delegated.    Requested Prescriptions  Pending Prescriptions Disp Refills   clonazePAM (KLONOPIN) 0.5 MG tablet [Pharmacy Med Name: CLONAZEPAM 0.5 MG TABLET] 30 tablet 0    Sig: Take 1 tablet (0.5 mg total) by mouth 3 (three) times daily as needed for anxiety.     Not Delegated - Psychiatry: Anxiolytics/Hypnotics 2 Failed - 09/30/2023  7:59 PM      Failed - This refill cannot be delegated      Failed - Urine Drug Screen completed in last 360 days      Failed - Valid encounter within last 6 months    Recent Outpatient Visits           1 year ago Onychomycosis   [facility] Donita Brooks, MD   2 years ago Syncope, unspecified syncope type   [facility] Donita Brooks, MD   2 years ago Chest pain, unspecified type   [facility] Donita Brooks, MD   2 years ago Flank pain   [facility] Donita Brooks, MD   2 years ago Benign essential HTN   [facility] Pickard, Priscille Heidelberg, MD              Passed - Patient is not pregnant

## 2023-10-02 ENCOUNTER — Telehealth: Payer: Self-pay | Admitting: Gastroenterology

## 2023-10-02 NOTE — Telephone Encounter (Signed)
Patient needs appt with Dr. Jena Gauss only (nonurgent) for follow up IDA and elevated LFTs.

## 2023-10-06 ENCOUNTER — Telehealth: Payer: Self-pay | Admitting: Internal Medicine

## 2023-10-06 NOTE — Telephone Encounter (Signed)
LSL asked that patient be scheduled with Dr. Jena Gauss ONLY for f/u IDA and elevated lfts.  Left patient a message asking him to return call to get scheduled.

## 2023-10-07 ENCOUNTER — Other Ambulatory Visit: Payer: Self-pay | Admitting: Family Medicine

## 2023-10-12 ENCOUNTER — Other Ambulatory Visit: Payer: Self-pay

## 2023-10-12 ENCOUNTER — Telehealth: Payer: Self-pay

## 2023-10-12 DIAGNOSIS — I10 Essential (primary) hypertension: Secondary | ICD-10-CM

## 2023-10-12 MED ORDER — AMLODIPINE BESYLATE 10 MG PO TABS
10.0000 mg | ORAL_TABLET | Freq: Every day | ORAL | 0 refills | Status: DC
Start: 2023-10-12 — End: 2023-10-26

## 2023-10-12 MED ORDER — CLONAZEPAM 0.5 MG PO TABS: 0.5000 mg | ORAL_TABLET | Freq: Three times a day (TID) | ORAL | 0 refills | Status: DC | PRN

## 2023-10-12 NOTE — Telephone Encounter (Signed)
Copied from CRM [id number]. Topic: General - Other >> Oct 12, 2023 12:20 PM Roswell Nickel wrote: Reason for CRM: Pt calling to following on script request that he sent on Friday.

## 2023-10-26 ENCOUNTER — Other Ambulatory Visit: Payer: Self-pay

## 2023-10-26 DIAGNOSIS — I10 Essential (primary) hypertension: Secondary | ICD-10-CM

## 2023-10-26 MED ORDER — AMLODIPINE BESYLATE 10 MG PO TABS: 10.0000 mg | ORAL_TABLET | Freq: Every day | ORAL | 1 refills | Status: DC

## 2023-11-02 ENCOUNTER — Other Ambulatory Visit: Payer: Self-pay | Admitting: Family Medicine

## 2023-11-03 ENCOUNTER — Other Ambulatory Visit: Payer: Self-pay

## 2023-11-03 DIAGNOSIS — F411 Generalized anxiety disorder: Secondary | ICD-10-CM

## 2023-11-03 NOTE — Telephone Encounter (Signed)
Prescription Request  11/03/2023  LOV: 09/08/23  What is the name of the medication or equipment? venlafaxine XR (EFFEXOR-XR) 150 MG 24 hr capsule [[id number]]   Have you contacted your pharmacy to request a refill? Yes   Which pharmacy would you like this sent to?  [id number] - [place], [place] - [address] AT [facility] [address] [place] [address] Phone: [phone] Fax: [phone]    Patient notified that their request is being sent to the clinical staff for review and that they should receive a response within 2 business days.   Please advise at [facility] [phone]

## 2023-11-04 MED ORDER — VENLAFAXINE HCL ER 150 MG PO CP24: ORAL_CAPSULE | ORAL | 1 refills | Status: DC

## 2023-11-04 NOTE — Telephone Encounter (Signed)
Requested Prescriptions  Pending Prescriptions Disp Refills   venlafaxine XR (EFFEXOR-XR) 150 MG 24 hr capsule 90 capsule 1    Sig: TAKE 1 CAPSULE BY MOUTH EVERY DAY IN THE MORNING.     Psychiatry: Antidepressants - SNRI - desvenlafaxine & venlafaxine Failed - 11/03/2023  1:48 PM      Failed - Valid encounter within last 6 months    Recent Outpatient Visits           1 year ago Onychomycosis   [facility] Tanya Nones, Priscille Heidelberg, MD   2 years ago Syncope, unspecified syncope type   [facility] Pickard, Priscille Heidelberg, MD   2 years ago Chest pain, unspecified type   [facility] Tanya Nones, Priscille Heidelberg, MD   2 years ago Flank pain   [facility] Donita Brooks, MD   2 years ago Benign essential HTN   [facility] Donita Brooks, MD              Failed - Lipid Panel in normal range within the last 12 months    Cholesterol  Date Value Ref Range Status  09/08/2023 174 <200 mg/dL Final   LDL Cholesterol (Calc)  Date Value Ref Range Status  09/08/2023 98 mg/dL (calc) Final    Comment:    Reference range: <100 . Desirable range <100 mg/dL for primary prevention;   <70 mg/dL for patients with CHD or diabetic patients  with > or = 2 CHD risk factors. Marland Kitchen LDL-C is now calculated using the Martin-Hopkins  calculation, which is a validated novel method providing  better accuracy than the Friedewald equation in the  estimation of LDL-C.  Horald Pollen et al. Lenox Ahr. 0981;191(47): 2061-2068  (http://education.[employer].com/faq/FAQ164)    HDL  Date Value Ref Range Status  09/08/2023 53 > OR = 40 mg/dL Final   Triglycerides  Date Value Ref Range Status  09/08/2023 132 <150 mg/dL Final         Passed - Cr in normal range and within 360 days    Creat  Date Value Ref Range Status  09/08/2023 0.78 0.70 - 1.30 mg/dL Final         Passed - Last BP in normal range    BP Readings from Last 1 Encounters:   09/10/23 129/82

## 2023-11-22 ENCOUNTER — Other Ambulatory Visit: Payer: Self-pay | Admitting: Family Medicine

## 2023-11-29 ENCOUNTER — Telehealth: Payer: Self-pay | Admitting: Gastroenterology

## 2023-11-29 NOTE — Telephone Encounter (Signed)
 Patient needs ov with RMR only for IDA and elevated LFTs. See previous telephone note request.

## 2023-12-02 NOTE — Telephone Encounter (Signed)
 Noted, thanks!

## 2023-12-02 NOTE — Telephone Encounter (Signed)
 Tammy, letting you know the patient returned the call and I scheduled him with Dr. Jena Gauss for Feb. 4.  I offered 2 dates in January and he couldn't do either one of those dates.

## 2023-12-19 ENCOUNTER — Other Ambulatory Visit: Payer: Self-pay | Admitting: Family Medicine

## 2023-12-21 ENCOUNTER — Other Ambulatory Visit: Payer: Self-pay | Admitting: Family Medicine

## 2023-12-21 DIAGNOSIS — R000 Tachycardia, unspecified: Secondary | ICD-10-CM

## 2023-12-21 MED ORDER — CARVEDILOL 12.5 MG PO TABS: 12.5000 mg | ORAL_TABLET | Freq: Two times a day (BID) | ORAL | 1 refills | Status: DC

## 2023-12-21 MED ORDER — FLUTICASONE PROPIONATE 50 MCG/ACT NA SUSP: NASAL | 3 refills | Status: AC

## 2023-12-21 NOTE — Telephone Encounter (Signed)
Requested medication (s) are due for refill today: yes  Requested medication (s) are on the active medication list: yes  Last refill:  03/20/23  Future visit scheduled: no  Notes to clinic:  Unable to refill per protocol, courtesy refill already given, routing for provider approval.     Requested Prescriptions  Pending Prescriptions Disp Refills   carvedilol (COREG) 12.5 MG tablet 180 tablet 3    Sig: Take 1 tablet (12.5 mg total) by mouth 2 (two) times daily with a meal.     Cardiovascular: Beta Blockers 3 Failed - 12/21/2023 11:23 AM      Failed - AST in normal range and within 360 days    AST  Date Value Ref Range Status  09/08/2023 78 (H) 10 - 35 U/L Final         Failed - ALT in normal range and within 360 days    ALT  Date Value Ref Range Status  09/08/2023 85 (H) 9 - 46 U/L Final         Failed - Valid encounter within last 6 months    Recent Outpatient Visits           1 year ago Onychomycosis   [facility] Donita Brooks, MD   2 years ago Syncope, unspecified syncope type   [facility] Pickard, Priscille Heidelberg, MD   2 years ago Chest pain, unspecified type   [facility] Medicine Tanya Nones, Priscille Heidelberg, MD   2 years ago Flank pain   [facility] Donita Brooks, MD   2 years ago Benign essential HTN   [facility] Pickard, Priscille Heidelberg, MD              Passed - Cr in normal range and within 360 days    Creat  Date Value Ref Range Status  09/08/2023 0.78 0.70 - 1.30 mg/dL Final         Passed - Last BP in normal range    BP Readings from Last 1 Encounters:  09/10/23 129/82         Passed - Last Heart Rate in normal range    Pulse Readings from Last 1 Encounters:  09/10/23 73          fluticasone (FLONASE) 50 MCG/ACT nasal spray 48 mL 3    Sig: SPRAY 2 SPRAYS INTO EACH NOSTRIL EVERY DAY     Ear, Nose, and Throat: Nasal Preparations - Corticosteroids Failed - 12/21/2023 11:23 AM       Failed - Valid encounter within last 12 months    Recent Outpatient Visits           1 year ago Onychomycosis   [facility] Donita Brooks, MD   2 years ago Syncope, unspecified syncope type   [facility] Medicine Donita Brooks, MD   2 years ago Chest pain, unspecified type   [facility] Medicine Donita Brooks, MD   2 years ago Flank pain   [facility] Donita Brooks, MD   2 years ago Benign essential HTN   [facility] Pickard, Priscille Heidelberg, MD

## 2023-12-21 NOTE — Telephone Encounter (Signed)
Prescription Request  12/21/2023  LOV: 09/08/2023  What is the name of the medication or equipment?   carvedilol (COREG) 12.5 MG tablet **90 day script requested**  fluticasone (FLONASE) 50 MCG/ACT nasal spray [[id number]] **one year script requested**   Have you contacted your pharmacy to request a refill? Yes   Which pharmacy would you like this sent to?  [id number] - [place], [place] - [address] AT [facility] [address] [place] [address] Phone: [phone] Fax: [phone]    Patient notified that their request is being sent to the clinical staff for review and that they should receive a response within 2 business days.   Please advise pharmacist.

## 2023-12-29 ENCOUNTER — Ambulatory Visit: Payer: PPO | Admitting: Internal Medicine

## 2023-12-29 ENCOUNTER — Encounter: Payer: Self-pay | Admitting: Internal Medicine

## 2023-12-29 ENCOUNTER — Encounter: Payer: Self-pay | Admitting: *Deleted

## 2023-12-29 ENCOUNTER — Other Ambulatory Visit: Payer: Self-pay | Admitting: *Deleted

## 2023-12-29 VITALS — BP 142/83 | HR 67 | Temp 98.3°F | Ht 68.0 in | Wt 226.2 lb

## 2023-12-29 DIAGNOSIS — D509 Iron deficiency anemia, unspecified: Secondary | ICD-10-CM | POA: Diagnosis not present

## 2023-12-29 DIAGNOSIS — R932 Abnormal findings on diagnostic imaging of liver and biliary tract: Secondary | ICD-10-CM

## 2023-12-29 DIAGNOSIS — Z6834 Body mass index (BMI) 34.0-34.9, adult: Secondary | ICD-10-CM

## 2023-12-29 DIAGNOSIS — R7989 Other specified abnormal findings of blood chemistry: Secondary | ICD-10-CM

## 2023-12-29 DIAGNOSIS — E669 Obesity, unspecified: Secondary | ICD-10-CM | POA: Diagnosis not present

## 2023-12-29 DIAGNOSIS — K2270 Barrett's esophagus without dysplasia: Secondary | ICD-10-CM

## 2023-12-29 DIAGNOSIS — F1010 Alcohol abuse, uncomplicated: Secondary | ICD-10-CM | POA: Diagnosis not present

## 2023-12-29 DIAGNOSIS — D500 Iron deficiency anemia secondary to blood loss (chronic): Secondary | ICD-10-CM

## 2023-12-29 NOTE — Progress Notes (Signed)
 Primary Care Physician:  Duanne Butler DASEN, MD Primary Gastroenterologist:  Dr. Shaaron  Pre-Procedure History & Physical: HPI:  [name] is a 58 y.o. male here for follow-up iron deficiency anemia.  EGD and colonoscopy done down in [place] because of loop recorder issues (anesthesia request).  EGD and colonoscopy in August of last year demonstrated non-H. pylori gastritis and short segment Barrett's esophagus without dysplasia also 4-5 small adenomas removed from his colon (5-year surveillance recommended) mild transaminitis noted over the past year transaminases running less than 100-nonspecific pattern.  Positive ANA.  Slightly elevated serum IgA.  He had a positive hepatitis C antibody but a negative RNA. TTG serologies and duodenal biopsy negative for celiac disease. Of note, no stigmata of portal hypertension on EGD last year.  Patient readily admits to drinking beer  - he loves beer.  He drinks it every day  - 5 to 8 cans - has been doing so for many many years.  He has been told to stop.  He tells me he is not interested in stopping.  Does not drink and drive.  No DWIs.  He is on disability because of multiple surgeries. History of prediabetes. Patient without any GI complaints at this time - ,no reflux no dysphagia odynophagia, abdominal lpain, nausea vomiting, melena or rectal bleeding.  Appetite maintained. Past Medical History:  Diagnosis Date   Allergy    Anemia    Colonic diverticular abscess 12/26/2014   Diverticulitis    ETOH abuse    GAD (generalized anxiety disorder)    GERD (gastroesophageal reflux disease)    History of fainting spells of unknown cause    Hypercholesteremia    Hypertension    Left rotator cuff tear 02/08/2019   Sleep apnea    Superior labrum anterior-to-posterior (SLAP) tear of left shoulder 02/08/2019   Tobacco abuse     Past Surgical History:  Procedure Laterality Date   APPENDECTOMY N/A 01/01/2015   Procedure: INCIDENTAL  APPENDECTOMY;  Surgeon: Oneil DELENA Budge, MD;  Location: [facility];  Service: General;  Laterality: N/A;   BACK SURGERY     lower back, fusion   BIOPSY  07/16/2023   Procedure: BIOPSY;  Surgeon: San Sandor GAILS, DO;  Location: [facility];  Service: Gastroenterology;;   COLONOSCOPY WITH PROPOFOL  N/A 12/21/2017   Procedure: COLONOSCOPY WITH PROPOFOL ;  Surgeon: Shaaron Lamar HERO, MD;  Location: [facility];  Service: Endoscopy;  Laterality: N/A;  9:45am   COLONOSCOPY WITH PROPOFOL  N/A 07/16/2023   Procedure: COLONOSCOPY WITH PROPOFOL ;  Surgeon: San Sandor GAILS, DO;  Location: [facility];  Service: Gastroenterology;  Laterality: N/A;   ESOPHAGOGASTRODUODENOSCOPY (EGD) WITH PROPOFOL  N/A 07/16/2023   Procedure: ESOPHAGOGASTRODUODENOSCOPY (EGD) WITH PROPOFOL ;  Surgeon: San Sandor GAILS, DO;  Location: [facility];  Service: Gastroenterology;  Laterality: N/A;   GASTRECTOMY N/A 04/14/2016   Procedure: ELIGIO;  Surgeon: Oneil Budge, MD;  Location: [facility];  Service: General;  Laterality: N/A;   HEMORRHOID SURGERY     HERNIA REPAIR     as child   HOT HEMOSTASIS N/A 07/16/2023   Procedure: HOT HEMOSTASIS (ARGON PLASMA COAGULATION/BICAP);  Surgeon: San Sandor GAILS, DO;  Location: [facility];  Service: Gastroenterology;  Laterality: N/A;   LOOP RECORDER INSERTION N/A 09/06/2021   Procedure: LOOP RECORDER INSERTION;  Surgeon: Francyne Headland, MD;  [facility];  Service: Cardiovascular;  Laterality: N/A;   PARTIAL COLECTOMY N/A 01/01/2015   Procedure: PARTIAL COLECTOMY;  Surgeon: Oneil DELENA Budge, MD;  Location: [facility];  Service: General;  Laterality: N/A;   POLYPECTOMY  12/21/2017   Procedure: POLYPECTOMY;  Surgeon: Shaaron Lamar HERO, MD;  Location: [facility];  Service: Endoscopy;;  colon   POLYPECTOMY  07/16/2023   Procedure: POLYPECTOMY;  Surgeon: San Sandor GAILS, DO;  Location: [facility];  Service: Gastroenterology;;   ROTATOR CUFF REPAIR Bilateral    SHOULDER  ARTHROSCOPY WITH ROTATOR CUFF REPAIR AND SUBACROMIAL DECOMPRESSION Left 02/09/2019   Procedure: SHOULDER ARTHROSCOPY WITH ROTATOR CUFF REPAIR AND SUBACROMIAL PARTIAL ACROMIOPLASTY, DISTAL CLAVICULECTOMY, AND EXTENSIVE DEBRIDEMENT;  Surgeon: Jane Lamar, MD;  Location: [facility];  Service: Orthopedics;  Laterality: Left;    Prior to Admission medications   Medication Sig Start Date End Date Taking? Authorizing Provider  amLODipine  (NORVASC ) 10 MG tablet Take 1 tablet (10 mg total) by mouth daily. 10/26/23  Yes Duanne Butler DASEN, MD  atorvastatin  (LIPITOR) 40 MG tablet TAKE 1 TABLET BY MOUTH EVERY DAY 03/25/18  Yes Duanne Butler DASEN, MD  carvedilol  (COREG ) 12.5 MG tablet Take 1 tablet (12.5 mg total) by mouth 2 (two) times daily with a meal. 12/21/23  Yes Duanne Butler DASEN, MD  clindamycin  (CLEOCIN  T) 1 % SWAB APPLY TO AFFECTED AREA TWICE A DAY 08/19/23  Yes Alm Delon SAILOR, DO  clonazePAM  (KLONOPIN ) 0.5 MG tablet TAKE 1 TABLET (0.5 MG TOTAL) BY MOUTH 3 (THREE) TIMES DAILY AS NEEDED FOR ANXIETY. 12/21/23  Yes Duanne Butler DASEN, MD  cyclobenzaprine  (FLEXERIL ) 10 MG tablet TAKE 1 TABLET BY MOUTH EVERYDAY AT BEDTIME Patient taking differently: Take 10 mg by mouth at bedtime. 10/02/20  Yes Duanne Butler DASEN, MD  doxycycline  (VIBRA -TABS) 100 MG tablet TAKE 1 TABLET (100 MG TOTAL) BY MOUTH DAILY. TAKE WITH HEAVY MEAL, AVOID ALCOHOL 09/28/23 01/26/24 Yes Alm Delon SAILOR, DO  fluticasone  (FLONASE ) 50 MCG/ACT nasal spray SPRAY 2 SPRAYS INTO EACH NOSTRIL EVERY DAY 12/21/23  Yes Duanne Butler DASEN, MD  losartan  (COZAAR ) 50 MG tablet Take 1 tablet (50 mg total) by mouth daily. 02/24/23  Yes Duanne Butler DASEN, MD  Menthol  (BIOFREEZE) 10.5 % AERO Apply 1-2 sprays topically as needed (pain).   Yes [provider]  pantoprazole  (PROTONIX ) 40 MG tablet Take 1 tablet (40 mg total) by mouth daily. 08/25/23  Yes Duanne Butler DASEN, MD  sildenafil  (VIAGRA ) 100 MG tablet TAKE 1 TABLET BY MOUTH EVERY DAY AS  NEEDED FOR ERECTILE DYSFUNCTION 05/26/23  Yes Duanne Butler DASEN, MD  venlafaxine  XR (EFFEXOR -XR) 150 MG 24 hr capsule TAKE 1 CAPSULE BY MOUTH EVERY DAY IN THE MORNING. 11/04/23  Yes Duanne Butler DASEN, MD    Allergies as of 12/29/2023   (No Known Allergies)    Family History  Problem Relation Age of Onset   Diabetes Mother    Stroke Mother    Stroke Father    CAD Father    Colon cancer Neg Hx     Social History   Socioeconomic History   Marital status: Married    Spouse name: Not on file   Number of children: Not on file   Years of education: Not on file   Highest education level: Associate degree: occupational, scientist, product/process development, or vocational program  Occupational History   Not on file  Tobacco Use   Smoking status: Every Day    Current packs/day: 1.00    Average packs/day: 1 pack/day for 35.0 years (35.0 ttl pk-yrs)    Types: Cigarettes    Passive exposure: Current (Wife is a smoker)   Smokeless  tobacco: Never   Tobacco comments:    1.5 ppd since age 61.   Vaping Use   Vaping status: Never Used  Substance and Sexual Activity   Alcohol use: Yes    Alcohol/week: 48.0 standard drinks of alcohol    Types: 48 Cans of beer per week    Comment: patient states about 5 or 6 a day (beers)   Drug use: No   Sexual activity: Yes  Other Topics Concern   Not on file  Social History Narrative   Not on file   Social Drivers of Health   Financial Resource Strain: Low Risk  (09/04/2023)   Overall Financial Resource Strain (CARDIA)    Difficulty of Paying Living Expenses: Not very hard  Food Insecurity: No Food Insecurity (09/04/2023)   Hunger Vital Sign    Worried About Running Out of Food in the Last Year: Never true    Ran Out of Food in the Last Year: Never true  Transportation Needs: No Transportation Needs (09/04/2023)   PRAPARE - Administrator, Civil Service (Medical): No    Lack of Transportation (Non-Medical): No  Physical Activity: Insufficiently Active  (09/04/2023)   Exercise Vital Sign    Days of Exercise per Week: 3 days    Minutes of Exercise per Session: 20 min  Stress: No Stress Concern Present (09/04/2023)   [employer] of Occupational Health - Occupational Stress Questionnaire    Feeling of Stress : Only a little  Social Connections: Unknown (09/04/2023)   Social Connection and Isolation Panel [NHANES]    Frequency of Communication with Friends and Family: Three times a week    Frequency of Social Gatherings with Friends and Family: Three times a week    Attends Religious Services: Patient declined    Active Member of Clubs or Organizations: No    Attends Banker Meetings: Not on file    Marital Status: Married  Intimate Partner Violence: Not At Risk (10/14/2022)   Humiliation, Afraid, Rape, and Kick questionnaire    Fear of Current or Ex-Partner: No    Emotionally Abused: No    Physically Abused: No    Sexually Abused: No    Review of Systems: See HPI, otherwise negative ROS  Physical Exam: BP (!) 142/83 (BP Location: Left Arm, Patient Position: Sitting, Cuff Size: Large)   Pulse 67   Temp 98.3 F (36.8 C) (Oral)   Ht 5' 8 (1.727 m)   Wt 226 lb 3.2 oz (102.6 kg)   SpO2 94%   BMI 34.39 kg/m  General:   Alert, pleasant no acute distress.  Accompanied by his spouse.  Does have flushed facies. Abdomen: Obese.  Positive bowel sounds soft nontender no appreciable organomegaly no shifting dullness. Extremity exam: no edema  Impression/Plan: 58 year old obese gentleman with daily alcohol abuse with mildly elevated aminotransferases of at least 2 years duration.  Echogenic liver on CT. Serological workup negative for other causes.  Interesting, he has a positive HCV antibody but negative RNA (could be a false positive or a true positive with resolution of the infection).  Positive ANA and slightly elevated IgA highly nonspecific in this setting.  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  At this time, he appears not to have advanced chronic liver disease.  Statin therapy could be a attributing factor to elevated transaminases -felt not to be a significant factor at this time.  Short segment Barrett's esophagus diagnosed last year without dysplasia-on a PPI.  Consider repeat EGD in 3 years.  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  Rebound of CBC to the normal range on iron supplementation.  Multiple colonic adenomas removed last year colonoscopy; due for surveillance in 3 years.  Recommendations:  Patient admonished about needing to stop drinking alcohol.  It is hazardous to his health.  Continue Protonix  40 mg once daily 30 minutes before breakfast for GERD and Barrett's esophagus  Plan for repeat colonoscopy in 4 years given history of colon polyps  Repeat upper endoscopy to check on  Barrett's esophagus in about 3 years  CBC, c-Met, INR, iron, IBC and serum ferritin today.  Ultrasound with elastography in the near future; calculate fib 4 in the near future  Office visit back here in 3 months      Notice: This dictation was prepared with Dragon dictation along with smaller phrase technology. Any transcriptional errors that result from this process are unintentional and may not be corrected upon review.

## 2023-12-29 NOTE — Patient Instructions (Addendum)
 Nice to see you today.  Discussed with you previously, your intake of alcohol (beer) is excessive.  Its likely causing damage to your liver.  You should stop drinking alcohol  Continue Protonix  40 mg once daily 30 minutes before breakfast for GERD and Barrett's esophagus  Plan for repeat colonoscopy in 5 years given your history of colon polyps  Not discussed today, we will talk about repeat upper endoscopy to check on your Barrett's esophagus in about 3 years  Your CBC 3 months ago looked good.  Continue iron for now.  However, you may be able to stop in the near future.  CBC, c-Met, INR, iron, IBC and serum ferritin today    Ultrasound with elastography in the near future  Office visit back here in 3 months

## 2023-12-30 ENCOUNTER — Telehealth: Payer: Self-pay | Admitting: *Deleted

## 2023-12-30 NOTE — Telephone Encounter (Signed)
 LMOVM to return call  US  scheduled for 01/04/24 at [facility], arrive at 8:45 am to check in, NPO after midnight

## 2024-01-04 ENCOUNTER — Ambulatory Visit (HOSPITAL_COMMUNITY): Payer: PPO

## 2024-01-21 ENCOUNTER — Other Ambulatory Visit (HOSPITAL_COMMUNITY)
Admission: RE | Admit: 2024-01-21 | Discharge: 2024-01-21 | Disposition: A | Discharge status: Home / Self Care | Payer: PPO | Source: Ambulatory Visit | Attending: Internal Medicine | Admitting: Internal Medicine

## 2024-01-21 ENCOUNTER — Ambulatory Visit (HOSPITAL_COMMUNITY)
Admission: RE | Admit: 2024-01-21 | Discharge: 2024-01-21 | Disposition: A | Discharge status: Home / Self Care | Payer: PPO | Source: Ambulatory Visit | Attending: Internal Medicine | Admitting: Internal Medicine

## 2024-01-21 DIAGNOSIS — D500 Iron deficiency anemia secondary to blood loss (chronic): Secondary | ICD-10-CM | POA: Diagnosis not present

## 2024-01-21 DIAGNOSIS — N281 Cyst of kidney, acquired: Secondary | ICD-10-CM | POA: Diagnosis not present

## 2024-01-21 DIAGNOSIS — K760 Fatty (change of) liver, not elsewhere classified: Secondary | ICD-10-CM | POA: Diagnosis not present

## 2024-01-21 DIAGNOSIS — R7989 Other specified abnormal findings of blood chemistry: Secondary | ICD-10-CM | POA: Diagnosis not present

## 2024-01-21 LAB — CBC WITH DIFFERENTIAL/PLATELET
Abs Immature Granulocytes: 0.08 10*3/uL — ABNORMAL HIGH (ref 0.00–0.07)
Basophils Absolute: 0.1 10*3/uL (ref 0.0–0.1)
Basophils Relative: 1 %
Eosinophils Absolute: 0.3 10*3/uL (ref 0.0–0.5)
Eosinophils Relative: 3 %
HCT: 50.6 % (ref 39.0–52.0)
Hemoglobin: 17.2 g/dL — ABNORMAL HIGH (ref 13.0–17.0)
Immature Granulocytes: 1 %
Lymphocytes Relative: 17 %
Lymphs Abs: 1.7 10*3/uL (ref 0.7–4.0)
MCH: 33.3 pg (ref 26.0–34.0)
MCHC: 34 g/dL (ref 30.0–36.0)
MCV: 98.1 fL (ref 80.0–100.0)
Monocytes Absolute: 1.3 10*3/uL — ABNORMAL HIGH (ref 0.1–1.0)
Monocytes Relative: 12 %
Neutro Abs: 6.9 10*3/uL (ref 1.7–7.7)
Neutrophils Relative %: 66 %
Platelets: 214 10*3/uL (ref 150–400)
RBC: 5.16 MIL/uL (ref 4.22–5.81)
RDW: 13.1 % (ref 11.5–15.5)
WBC: 10.3 10*3/uL (ref 4.0–10.5)
nRBC: 0 % (ref 0.0–0.2)

## 2024-01-21 LAB — COMPREHENSIVE METABOLIC PANEL
ALT: 89 U/L — ABNORMAL HIGH (ref 0–44)
AST: 77 U/L — ABNORMAL HIGH (ref 15–41)
Albumin: 4 g/dL (ref 3.5–5.0)
Alkaline Phosphatase: 98 U/L (ref 38–126)
Anion gap: 10 (ref 5–15)
BUN: 12 mg/dL (ref 6–20)
CO2: 27 mmol/L (ref 22–32)
Calcium: 9.7 mg/dL (ref 8.9–10.3)
Chloride: 99 mmol/L (ref 98–111)
Creatinine, Ser: 0.8 mg/dL (ref 0.61–1.24)
GFR, Estimated: >60 mL/min (ref >60)
Glucose, Bld: 119 mg/dL — ABNORMAL HIGH (ref 70–99)
Potassium: 4.2 mmol/L (ref 3.5–5.1)
Sodium: 136 mmol/L (ref 135–145)
Total Bilirubin: 0.7 mg/dL (ref 0.0–1.2)
Total Protein: 7.5 g/dL (ref 6.5–8.1)

## 2024-01-21 LAB — FERRITIN: Ferritin: 166 ng/mL (ref 24–336)

## 2024-01-21 LAB — IRON AND TIBC
Iron: 143 ug/dL (ref 45–182)
Saturation Ratios: 35 % (ref 17.9–39.5)
TIBC: 408 ug/dL (ref 250–450)
UIBC: 265 ug/dL

## 2024-01-21 LAB — PROTIME-INR
INR: 1 (ref 0.8–1.2)
Prothrombin Time: 13.3 s (ref 11.4–15.2)

## 2024-01-28 ENCOUNTER — Ambulatory Visit: Payer: PPO | Admitting: Dermatology

## 2024-01-31 ENCOUNTER — Other Ambulatory Visit: Payer: Self-pay | Admitting: Dermatology

## 2024-01-31 ENCOUNTER — Other Ambulatory Visit: Payer: Self-pay | Admitting: Family Medicine

## 2024-01-31 DIAGNOSIS — L700 Acne vulgaris: Secondary | ICD-10-CM

## 2024-02-01 ENCOUNTER — Other Ambulatory Visit: Payer: Self-pay

## 2024-02-01 ENCOUNTER — Telehealth: Payer: Self-pay | Admitting: Family Medicine

## 2024-02-01 DIAGNOSIS — K271 Acute peptic ulcer, site unspecified, with perforation: Secondary | ICD-10-CM

## 2024-02-01 MED ORDER — PANTOPRAZOLE SODIUM 40 MG PO TBEC: 40.0000 mg | DELAYED_RELEASE_TABLET | Freq: Every day | ORAL | 1 refills | Status: DC

## 2024-02-01 MED ORDER — LOSARTAN POTASSIUM 50 MG PO TABS: 50.0000 mg | ORAL_TABLET | Freq: Every day | ORAL | 3 refills | Status: AC

## 2024-02-01 NOTE — Telephone Encounter (Signed)
 Prescription Request  02/01/2024  LOV: 09/08/2023  What is the name of the medication or equipment?   pantoprazole (PROTONIX) 40 MG tablet   losartan (COZAAR) 50 MG tablet [[id number]]   Have you contacted your pharmacy to request a refill? Yes   Which pharmacy would you like this sent to?  [id number] - [place], [place] - [address] AT [facility] [address] [place] [address] Phone: [phone] Fax: [phone]    Patient notified that their request is being sent to the clinical staff for review and that they should receive a response within 2 business days.   Please advise pharmacist.

## 2024-02-02 NOTE — Telephone Encounter (Signed)
 Requested medications are due for refill today.  yes  Requested medications are on the active medications list.  yes  Last refill. 12/21/2023 #30 0 rf  Future visit scheduled.   no  Notes to clinic.  Refill/refusal not delegated.    Requested Prescriptions  Pending Prescriptions Disp Refills   clonazePAM (KLONOPIN) 0.5 MG tablet [Pharmacy Med Name: CLONAZEPAM 0.5 MG TABLET] 30 tablet 0    Sig: TAKE 1 TABLET (0.5 MG TOTAL) BY MOUTH 3 (THREE) TIMES DAILY AS NEEDED FOR ANXIETY.     Not Delegated - Psychiatry: Anxiolytics/Hypnotics 2 Failed - 02/02/2024 10:25 AM      Failed - This refill cannot be delegated      Failed - Urine Drug Screen completed in last 360 days      Failed - Valid encounter within last 6 months    Recent Outpatient Visits           1 year ago Onychomycosis   [facility] Donita Brooks, MD   2 years ago Syncope, unspecified syncope type   [facility] Donita Brooks, MD   2 years ago Chest pain, unspecified type   [facility] Donita Brooks, MD   2 years ago Flank pain   [facility] Donita Brooks, MD   3 years ago Benign essential HTN   [facility] Pickard, Priscille Heidelberg, MD              Passed - Patient is not pregnant

## 2024-02-28 ENCOUNTER — Other Ambulatory Visit: Payer: Self-pay | Admitting: Dermatology

## 2024-02-28 ENCOUNTER — Other Ambulatory Visit: Payer: Self-pay | Admitting: Family Medicine

## 2024-02-28 DIAGNOSIS — L700 Acne vulgaris: Secondary | ICD-10-CM

## 2024-03-01 NOTE — Telephone Encounter (Signed)
 Requested medications are due for refill today.  yes  Requested medications are on the active medications list.  yes  Last refill. 02/02/2024 #30 0 rf  Future visit scheduled.   no  Notes to clinic.  Refill not delegated.    Requested Prescriptions  Pending Prescriptions Disp Refills   clonazePAM (KLONOPIN) 0.5 MG tablet [Pharmacy Med Name: CLONAZEPAM 0.5 MG TABLET] 30 tablet 0    Sig: TAKE 1 TABLET (0.5 MG TOTAL) BY MOUTH 3 (THREE) TIMES DAILY AS NEEDED FOR ANXIETY.     Not Delegated - Psychiatry: Anxiolytics/Hypnotics 2 Failed - 03/01/2024 10:01 AM      Failed - This refill cannot be delegated      Failed - Urine Drug Screen completed in last 360 days      Passed - Patient is not pregnant      Passed - Valid encounter within last 6 months    Recent Outpatient Visits           5 months ago Pure hypercholesterolemia   [facility] Pickard, Priscille Heidelberg, MD   1 year ago GAD (generalized anxiety disorder)   [facility] Pickard, Priscille Heidelberg, MD

## 2024-03-02 ENCOUNTER — Encounter: Payer: Self-pay | Admitting: Internal Medicine

## 2024-03-09 ENCOUNTER — Ambulatory Visit

## 2024-03-09 VITALS — BP 142/83 | Ht 68.0 in | Wt 226.0 lb

## 2024-03-09 DIAGNOSIS — Z0000 Encounter for general adult medical examination without abnormal findings: Secondary | ICD-10-CM | POA: Diagnosis not present

## 2024-03-09 NOTE — Progress Notes (Signed)
 Subjective:   [name] is a 58 y.o. male who presents for Medicare Annual/Subsequent preventive examination.  Visit Complete: Virtual I connected with  [name] on 03/09/24 by a audio enabled telemedicine application and verified that I am speaking with the correct person using two identifiers.  Patient Location: Home  Provider Location: Home Office  I discussed the limitations of evaluation and management by telemedicine. The patient expressed understanding and agreed to proceed.  Vital Signs: Because this visit was a virtual/telehealth visit, some criteria may be missing or patient reported. Any vitals not documented were not able to be obtained and vitals that have been documented are patient reported.  Patient Medicare AWV questionnaire was completed by the patient on 03/09/2024; I have confirmed that all information answered by patient is correct and no changes since this date.  Cardiac Risk Factors include: advanced age (>35men, >14 women);hypertension;dyslipidemia;male gender;obesity (BMI >30kg/m2);sedentary lifestyle;smoking/ tobacco exposure     Objective:    Today's Vitals   03/09/24 1337 03/09/24 1354  BP: (!) 142/83   Weight: 226 lb (102.5 kg)   Height: 5\' 8"  (1.727 m)   PainSc:  3    Body mass index is 34.36 kg/m.     03/09/2024    1:57 PM 07/16/2023    6:54 AM 04/13/2023    2:05 PM 10/14/2022    3:07 PM 09/06/2021    1:00 AM 02/09/2019    9:47 AM 02/08/2019    5:07 PM  Advanced Directives  Does Patient Have a Medical Advance Directive? No No No Yes No Yes Yes  Type of Theme park manager;Living will  Healthcare Power of [employer] Power of Attorney  Does patient want to make changes to medical advance directive?      No - Patient declined   Copy of Healthcare Power of Attorney in Chart?    No - copy requested  No - copy requested   Would patient like information on creating a medical advance directive? No  - Patient declined No - Patient declined No - Patient declined  No - Patient declined      Current Medications (verified) Outpatient Encounter Medications as of 03/09/2024  Medication Sig   amLODipine (NORVASC) 10 MG tablet Take 1 tablet (10 mg total) by mouth daily.   atorvastatin (LIPITOR) 40 MG tablet TAKE 1 TABLET BY MOUTH EVERY DAY   carvedilol (COREG) 12.5 MG tablet Take 1 tablet (12.5 mg total) by mouth 2 (two) times daily with a meal.   clindamycin (CLEOCIN T) 1 % SWAB APPLY TO AFFECTED AREA TWICE A DAY   clonazePAM (KLONOPIN) 0.5 MG tablet TAKE 1 TABLET (0.5 MG TOTAL) BY MOUTH 3 (THREE) TIMES DAILY AS NEEDED FOR ANXIETY.   cyclobenzaprine (FLEXERIL) 10 MG tablet TAKE 1 TABLET BY MOUTH EVERYDAY AT BEDTIME (Patient taking differently: Take 10 mg by mouth at bedtime.)   fluticasone (FLONASE) 50 MCG/ACT nasal spray SPRAY 2 SPRAYS INTO EACH NOSTRIL EVERY DAY   losartan (COZAAR) 50 MG tablet Take 1 tablet (50 mg total) by mouth daily.   Menthol (BIOFREEZE) 10.5 % AERO Apply 1-2 sprays topically as needed (pain).   pantoprazole (PROTONIX) 40 MG tablet Take 1 tablet (40 mg total) by mouth daily.   sildenafil (VIAGRA) 100 MG tablet TAKE 1 TABLET BY MOUTH EVERY DAY AS NEEDED FOR ERECTILE DYSFUNCTION   venlafaxine XR (EFFEXOR-XR) 150 MG 24 hr capsule TAKE 1 CAPSULE BY MOUTH EVERY DAY IN THE MORNING.  No facility-administered encounter medications on file as of 03/09/2024.    Allergies (verified) Patient has no known allergies.   History: Past Medical History:  Diagnosis Date   Allergy    Anemia    Colonic diverticular abscess 12/26/2014   Diverticulitis    ETOH abuse    GAD (generalized anxiety disorder)    GERD (gastroesophageal reflux disease)    History of fainting spells of unknown cause    Hypercholesteremia    Hypertension    Left rotator cuff tear 02/08/2019   Sleep apnea    Superior labrum anterior-to-posterior (SLAP) tear of left shoulder 02/08/2019   Tobacco abuse     Past Surgical History:  Procedure Laterality Date   APPENDECTOMY N/A 01/01/2015   Procedure: INCIDENTAL APPENDECTOMY;  Surgeon: Dalia Heading, MD;  Location: [facility];  Service: General;  Laterality: N/A;   BACK SURGERY     lower back, fusion   BIOPSY  07/16/2023   Procedure: BIOPSY;  Surgeon: Shellia Cleverly, DO;  Location: [facility];  Service: Gastroenterology;;   COLONOSCOPY WITH PROPOFOL N/A 12/21/2017   Procedure: COLONOSCOPY WITH PROPOFOL;  Surgeon: Corbin Ade, MD;  Location: [facility];  Service: Endoscopy;  Laterality: N/A;  9:45am   COLONOSCOPY WITH PROPOFOL N/A 07/16/2023   Procedure: COLONOSCOPY WITH PROPOFOL;  Surgeon: Shellia Cleverly, DO;  Location: [facility];  Service: Gastroenterology;  Laterality: N/A;   ESOPHAGOGASTRODUODENOSCOPY (EGD) WITH PROPOFOL N/A 07/16/2023   Procedure: ESOPHAGOGASTRODUODENOSCOPY (EGD) WITH PROPOFOL;  Surgeon: Shellia Cleverly, DO;  Location: [facility];  Service: Gastroenterology;  Laterality: N/A;   GASTRECTOMY N/A 04/14/2016   Procedure: Ferrel Logan;  Surgeon: Franky Macho, MD;  Location: [facility];  Service: General;  Laterality: N/A;   HEMORRHOID SURGERY     HERNIA REPAIR     as child   HOT HEMOSTASIS N/A 07/16/2023   Procedure: HOT HEMOSTASIS (ARGON PLASMA COAGULATION/BICAP);  Surgeon: Shellia Cleverly, DO;  Location: [facility];  Service: Gastroenterology;  Laterality: N/A;   LOOP RECORDER INSERTION N/A 09/06/2021   Procedure: LOOP RECORDER INSERTION;  Surgeon: Thurmon Fair, MD;  Location: [facility];  Service: Cardiovascular;  Laterality: N/A;   PARTIAL COLECTOMY N/A 01/01/2015   Procedure: PARTIAL COLECTOMY;  Surgeon: Dalia Heading, MD;  Location: [facility];  Service: General;  Laterality: N/A;   POLYPECTOMY  12/21/2017   Procedure: POLYPECTOMY;  Surgeon: Corbin Ade, MD;  Location[facility];  Service: Endoscopy;;  colon   POLYPECTOMY  07/16/2023   Procedure: POLYPECTOMY;  Surgeon: Shellia Cleverly,  DO;  Location: [facility];  Service: Gastroenterology;;   ROTATOR CUFF REPAIR Bilateral    SHOULDER ARTHROSCOPY WITH ROTATOR CUFF REPAIR AND SUBACROMIAL DECOMPRESSION Left 02/09/2019   Procedure: SHOULDER ARTHROSCOPY WITH ROTATOR CUFF REPAIR AND SUBACROMIAL PARTIAL ACROMIOPLASTY, DISTAL CLAVICULECTOMY, AND EXTENSIVE DEBRIDEMENT;  Surgeon: Salvatore Marvel, MD;  Location: [facility];  Service: Orthopedics;  Laterality: Left;   Family History  Problem Relation Age of Onset   Diabetes Mother    Stroke Mother    Stroke Father    CAD Father    Colon cancer Neg Hx    Social History   Socioeconomic History   Marital status: Married    Spouse name: Not on file   Number of children: Not on file   Years of education: Not on file   Highest education level: Associate degree: occupational, Scientist, product/process development, or vocational program  Occupational History   Not on file  Tobacco Use  Smoking status: Every Day    Current packs/day: 1.00    Average packs/day: 1 pack/day for 35.0 years (35.0 ttl pk-yrs)    Types: Cigarettes    Passive exposure: Current (Wife is a smoker)   Smokeless tobacco: Never   Tobacco comments:    1.5 ppd since age 33.   Vaping Use   Vaping status: Never Used  Substance and Sexual Activity   Alcohol use: Yes    Alcohol/week: 48.0 standard drinks of alcohol    Types: 48 Cans of beer per week    Comment: patient states "about 5 or 6 a day" (beers)   Drug use: No   Sexual activity: Yes  Other Topics Concern   Not on file  Social History Narrative   Not on file   Social Drivers of Health   Financial Resource Strain: Low Risk  (03/09/2024)   Overall Financial Resource Strain (CARDIA)    Difficulty of Paying Living Expenses: Not hard at all  Food Insecurity: No Food Insecurity (03/09/2024)   Hunger Vital Sign    Worried About Running Out of Food in the Last Year: Never true    Ran Out of Food in the Last Year: Never true  Transportation Needs: Unmet  Transportation Needs (03/09/2024)   PRAPARE - Transportation    Lack of Transportation (Medical): Yes    Lack of Transportation (Non-Medical): Yes  Physical Activity: Insufficiently Active (03/09/2024)   Exercise Vital Sign    Days of Exercise per Week: 2 days    Minutes of Exercise per Session: 10 min  Stress: No Stress Concern Present (03/09/2024)   [employer] of Occupational Health - Occupational Stress Questionnaire    Feeling of Stress : Only a little  Social Connections: Moderately Isolated (03/09/2024)   Social Connection and Isolation Panel [NHANES]    Frequency of Communication with Friends and Family: More than three times a week    Frequency of Social Gatherings with Friends and Family: Twice a week    Attends Religious Services: Never    Database administrator or Organizations: No    Attends Engineer, structural: Not on file    Marital Status: Married    Tobacco Counseling Ready to quit: Not Answered Counseling given: Not Answered Tobacco comments: 1.5 ppd since age 73.    Clinical Intake:  Pre-visit preparation completed: Yes  Pain : 0-10 Pain Score: 3  Pain Type: Chronic pain Pain Location: Back Pain Onset: More than a month ago     BMI - recorded: 34 Diabetes: No  How often do you need to have someone help you when you read instructions, pamphlets, or other written materials from your doctor or pharmacy?: 1 - Never What is the last grade level you completed in school?: 12th grade  Interpreter Needed?: No      Activities of Daily Living    03/09/2024    1:55 PM 04/13/2023    2:07 PM  In your present state of health, do you have any difficulty performing the following activities:  Hearing? 0   Vision? 0   Difficulty concentrating or making decisions? 0   Walking or climbing stairs? 1   Dressing or bathing? 1   Doing errands, shopping? 0 0  Preparing Food and eating ? N   Using the Toilet? N   In the past six months, have you  accidently leaked urine? N   Do you have problems with loss of bowel control? N   Managing your  Medications? N   Managing your Finances? N   Housekeeping or managing your Housekeeping? N     Patient Care Team: Donita Brooks, MD as PCP - General (Family Medicine) Jena Gauss Gerrit Friends, MD as Consulting Physician (Gastroenterology)  Indicate any recent Medical Services you may have received from other than [facility] providers in the past year (date may be approximate).     Assessment:   This is a routine wellness examination for Rudi.  Hearing/Vision screen No results found.   Goals Addressed             This Visit's Progress    CCM Expected Outcome:  Monitor, Self-Manage and Reduce Symptoms of:       refused      Depression Screen    03/09/2024    1:57 PM 10/14/2022    3:06 PM 05/09/2021   12:30 PM 05/09/2021   12:28 PM 01/31/2021   11:32 AM 07/07/2017    2:26 PM 08/22/2016    2:30 PM  PHQ 2/9 Scores  PHQ - 2 Score 0 0 0 0 0 2 5  PHQ- 9 Score   3  3 12 18     Fall Risk    03/09/2024    1:58 PM 10/14/2022    1:59 PM 05/09/2021   12:28 PM 01/31/2021   11:31 AM 08/22/2016    2:30 PM  Fall Risk   Falls in the past year? 0 1 0 0 No  Number falls in past yr: 0 0 0    Injury with Fall? 0 1 0    Risk for fall due to : No Fall Risks History of fall(s);Impaired balance/gait No Fall Risks No Fall Risks   Follow up Falls evaluation completed Falls prevention discussed Falls evaluation completed Falls evaluation completed     MEDICARE RISK AT HOME: Medicare Risk at Home Any stairs in or around the home?: No If so, are there any without handrails?: No Home free of loose throw rugs in walkways, pet beds, electrical cords, etc?: Yes Adequate lighting in your home to reduce risk of falls?: Yes Life alert?: No Use of a cane, walker or w/c?: No Grab bars in the bathroom?: No Shower chair or bench in shower?: No Elevated toilet seat or a handicapped toilet?: No  TIMED UP AND  GO:  Was the test performed?  No    Cognitive Function:        03/09/2024    1:59 PM 10/14/2022    3:08 PM  6CIT Screen  What Year? 0 points 0 points  What month? 0 points 0 points  What time? 0 points 0 points  Count back from 20 0 points 0 points  Months in reverse 0 points 0 points  Repeat phrase 0 points 2 points  Total Score 0 points 2 points    Immunizations Immunization History  Administered Date(s) Administered   Influenza Split 12/02/2012   Influenza, Seasonal, Injecte, Preservative Fre 09/08/2023   Influenza,inj,quad, With Preservative 08/24/2017   Moderna Sars-Covid-2 Vaccination 06/05/2020, 07/02/2020, 01/23/2021   PNEUMOCOCCAL CONJUGATE-20 02/09/2023   Pneumococcal Polysaccharide-23 12/09/2014   Td 09/01/2006   Td,absorbed, Preservative Free, Adult Use, Lf Unspecified 09/01/2006    TDAP status: Due, Education has been provided regarding the importance of this vaccine. Advised may receive this vaccine at local pharmacy or Health Dept. Aware to provide a copy of the vaccination record if obtained from local pharmacy or Health Dept. Verbalized acceptance and understanding.  Flu Vaccine status: Up to date  Pneumococcal vaccine status: Declined,  Education has been provided regarding the importance of this vaccine but patient still declined. Advised may receive this vaccine at local pharmacy or Health Dept. Aware to provide a copy of the vaccination record if obtained from local pharmacy or Health Dept. Verbalized acceptance and understanding.   Covid-19 vaccine status: Information provided on how to obtain vaccines.   Qualifies for Shingles Vaccine? Yes   Zostavax completed No   Shingrix Completed?: No.    Education has been provided regarding the importance of this vaccine. Patient has been advised to call insurance company to determine out of pocket expense if they have not yet received this vaccine. Advised may also receive vaccine at local pharmacy or Health  Dept. Verbalized acceptance and understanding.  Screening Tests Health Maintenance  Topic Date Due   Zoster Vaccines- Shingrix (1 of 2) Never done   Lung Cancer Screening  09/06/2022   COVID-19 Vaccine (4 - 2024-25 season) 07/26/2023   DTaP/Tdap/Td (2 - Tdap) 09/07/2024 (Originally 09/01/2016)   INFLUENZA VACCINE  06/24/2024   Medicare Annual Wellness (AWV)  03/09/2025   Colonoscopy  07/15/2033   Pneumococcal Vaccine 75-74 Years old  Completed   Hepatitis C Screening  Completed   HIV Screening  Completed   HPV VACCINES  Aged Out   Meningococcal B Vaccine  Aged Out    Health Maintenance  Health Maintenance Due  Topic Date Due   Zoster Vaccines- Shingrix (1 of 2) Never done   Lung Cancer Screening  09/06/2022   COVID-19 Vaccine (4 - 2024-25 season) 07/26/2023    Colorectal cancer screening: Type of screening: Colonoscopy. Completed 07/16/2023. Repeat every 10 years  Lung Cancer Screening: (Low Dose CT Chest recommended if Age 3-80 years, 20 pack-year currently smoking OR have quit w/in 15years.) does qualify.   Lung Cancer Screening Referral: completed 09/06/2021  Additional Screening:  Hepatitis C Screening: does qualify; Completed   Vision Screening: Recommended annual ophthalmology exams for early detection of glaucoma and other disorders of the eye. Is the patient up to date with their annual eye exam?  Yes  Who is the provider or what is the name of the office in which the patient attends annual eye exams? Dr Barbra Boone If pt is not established with a provider, would they like to be referred to a provider to establish care? No .   Dental Screening: Recommended annual dental exams for proper oral hygiene  Diabetic Foot Exam:   Community Resource Referral / Chronic Care Management: CRR required this visit?  No   CCM required this visit?  No     Plan:     I have personally reviewed and noted the following in the patient's chart:   Medical and social history Use  of alcohol, tobacco or illicit drugs  Current medications and supplements including opioid prescriptions. Patient is not currently taking opioid prescriptions. Functional ability and status Nutritional status Physical activity Advanced directives List of other physicians Hospitalizations, surgeries, and ER visits in previous 12 months Vitals Screenings to include cognitive, depression, and falls Referrals and appointments  In addition, I have reviewed and discussed with patient certain preventive protocols, quality metrics, and best practice recommendations. A written personalized care plan for preventive services as well as general preventive health recommendations were provided to patient.     Alvina Axon   03/09/2024   After Visit Summary: (MyChart) Due to this being a telephonic visit, the after visit summary with patients personalized plan was offered to patient  via MyChart   Nurse Notes:    Mr. Cockerham , Thank you for taking time to come for your Medicare Wellness Visit. I appreciate your ongoing commitment to your health goals. Please review the following plan we discussed and let me know if I can assist you in the future.   These are the goals we discussed:  Goals      CCM Expected Outcome:  Monitor, Self-Manage and Reduce Symptoms of:     refused     Exercise 3x per week (30 min per time)     Try to move more.         This is a list of the screening recommended for you and due dates:  Health Maintenance  Topic Date Due   Zoster (Shingles) Vaccine (1 of 2) Never done   Screening for Lung Cancer  09/06/2022   COVID-19 Vaccine (4 - 2024-25 season) 07/26/2023   DTaP/Tdap/Td vaccine (2 - Tdap) 09/07/2024*   Flu Shot  06/24/2024   Medicare Annual Wellness Visit  03/09/2025   Colon Cancer Screening  07/15/2033   Pneumococcal Vaccination  Completed   Hepatitis C Screening  Completed   HIV Screening  Completed   HPV Vaccine  Aged Out   Meningitis B Vaccine  Aged  Out  *Topic was postponed. The date shown is not the original due date.

## 2024-03-09 NOTE — Patient Instructions (Signed)
 Health Maintenance, Male  Adopting a healthy lifestyle and getting preventive care are important in promoting health and wellness. Ask your health care provider about:  The right schedule for you to have regular tests and exams.  Things you can do on your own to prevent diseases and keep yourself healthy.  What should I know about diet, weight, and exercise?  Eat a healthy diet    Eat a diet that includes plenty of vegetables, fruits, low-fat dairy products, and lean protein.  Do not eat a lot of foods that are high in solid fats, added sugars, or sodium.  Maintain a healthy weight  Body mass index (BMI) is a measurement that can be used to identify possible weight problems. It estimates body fat based on height and weight. Your health care provider can help determine your BMI and help you achieve or maintain a healthy weight.  Get regular exercise  Get regular exercise. This is one of the most important things you can do for your health. Most adults should:  Exercise for at least 150 minutes each week. The exercise should increase your heart rate and make you sweat (moderate-intensity exercise).  Do strengthening exercises at least twice a week. This is in addition to the moderate-intensity exercise.  Spend less time sitting. Even light physical activity can be beneficial.  Watch cholesterol and blood lipids  Have your blood tested for lipids and cholesterol at 60 years of age, then have this test every 5 years.  You may need to have your cholesterol levels checked more often if:  Your lipid or cholesterol levels are high.  You are older than 61 years of age.  You are at high risk for heart disease.  What should I know about cancer screening?  Many types of cancers can be detected early and may often be prevented. Depending on your health history and family history, you may need to have cancer screening at various ages. This may include screening for:  Colorectal cancer.  Prostate cancer.  Skin cancer.  Lung  cancer.  What should I know about heart disease, diabetes, and high blood pressure?  Blood pressure and heart disease  High blood pressure causes heart disease and increases the risk of stroke. This is more likely to develop in people who have high blood pressure readings or are overweight.  Talk with your health care provider about your target blood pressure readings.  Have your blood pressure checked:  Every 3-5 years if you are 9-95 years of age.  Every year if you are 85 years old or older.  If you are between the ages of 29 and 29 and are a current or former smoker, ask your health care provider if you should have a one-time screening for abdominal aortic aneurysm (AAA).  Diabetes  Have regular diabetes screenings. This checks your fasting blood sugar level. Have the screening done:  Once every three years after age 23 if you are at a normal weight and have a low risk for diabetes.  More often and at a younger age if you are overweight or have a high risk for diabetes.  What should I know about preventing infection?  Hepatitis B  If you have a higher risk for hepatitis B, you should be screened for this virus. Talk with your health care provider to find out if you are at risk for hepatitis B infection.  Hepatitis C  Blood testing is recommended for:  Everyone born from [birth year] through 1967.  Anyone  with known risk factors for hepatitis C.  Sexually transmitted infections (STIs)  You should be screened each year for STIs, including gonorrhea and chlamydia, if:  You are sexually active and are younger than 60 years of age.  You are older than 60 years of age and your health care provider tells you that you are at risk for this type of infection.  Your sexual activity has changed since you were last screened, and you are at increased risk for chlamydia or gonorrhea. Ask your health care provider if you are at risk.  Ask your health care provider about whether you are at high risk for HIV. Your health care provider  may recommend a prescription medicine to help prevent HIV infection. If you choose to take medicine to prevent HIV, you should first get tested for HIV. You should then be tested every 3 months for as long as you are taking the medicine.  Follow these instructions at home:  Alcohol use  Do not drink alcohol if your health care provider tells you not to drink.  If you drink alcohol:  Limit how much you have to 0-2 drinks a day.  Know how much alcohol is in your drink. In the [place] one drink equals one 12 oz bottle of beer (355 mL), one 5 oz glass of wine (148 mL), or one 1 oz glass of hard liquor (44 mL).  Lifestyle  Do not use any products that contain nicotine or tobacco. These products include cigarettes, chewing tobacco, and vaping devices, such as e-cigarettes. If you need help quitting, ask your health care provider.  Do not use street drugs.  Do not share needles.  Ask your health care provider for help if you need support or information about quitting drugs.  General instructions  Schedule regular health, dental, and eye exams.  Stay current with your vaccines.  Tell your health care provider if:  You often feel depressed.  You have ever been abused or do not feel safe at home.  Summary  Adopting a healthy lifestyle and getting preventive care are important in promoting health and wellness.  Follow your health care provider's instructions about healthy diet, exercising, and getting tested or screened for diseases.  Follow your health care provider's instructions on monitoring your cholesterol and blood pressure.  This information is not intended to replace advice given to you by your health care provider. Make sure you discuss any questions you have with your health care provider.  Document Revised: 04/01/2021 Document Reviewed: 04/01/2021  Elsevier Patient Education  2024 [employer].

## 2024-03-27 ENCOUNTER — Other Ambulatory Visit: Payer: Self-pay | Admitting: Family Medicine

## 2024-03-27 DIAGNOSIS — R000 Tachycardia, unspecified: Secondary | ICD-10-CM

## 2024-03-29 NOTE — Telephone Encounter (Signed)
 Requested medication (s) are due for refill today yes  Requested medication (s) are on the active medication list -yes  Future visit scheduled -no  Last refill: 03/01/24 #30- non delegated Rx                    Notes to clinic: non delegated Rx  Requested Prescriptions  Pending Prescriptions Disp Refills   clonazePAM  (KLONOPIN ) 0.5 MG tablet [Pharmacy Med Name: CLONAZEPAM  0.5 MG TABLET] 30 tablet 0    Sig: TAKE 1 TABLET (0.5 MG TOTAL) BY MOUTH 3 (THREE) TIMES DAILY AS NEEDED FOR ANXIETY.     Not Delegated - Psychiatry: Anxiolytics/Hypnotics 2 Failed - 03/29/2024  3:04 PM      Failed - This refill cannot be delegated      Failed - Urine Drug Screen completed in last 360 days      Failed - Valid encounter within last 6 months    Recent Outpatient Visits           6 months ago Pure hypercholesterolemia   [facility] Pickard, Cisco Crest, MD   1 year ago GAD (generalized anxiety disorder)   [place] [facility] Pickard, Cisco Crest, MD              Passed - Patient is not pregnant      Refused Prescriptions Disp Refills   carvedilol  (COREG ) 12.5 MG tablet [Pharmacy Med Name: CARVEDILOL  12.5 MG TABLET] 180 tablet 1    Sig: TAKE 1 TABLET (12.5MG  TOTAL) BY MOUTH TWICE A DAY WITH MEALS     Cardiovascular: Beta Blockers 3 Failed - 03/29/2024  3:04 PM      Failed - AST in normal range and within 360 days    AST  Date Value Ref Range Status  01/21/2024 77 (H) 15 - 41 U/L Final         Failed - ALT in normal range and within 360 days    ALT  Date Value Ref Range Status  01/21/2024 89 (H) 0 - 44 U/L Final         Failed - Last BP in normal range    BP Readings from Last 1 Encounters:  03/09/24 (!) 142/83         Failed - Valid encounter within last 6 months    Recent Outpatient Visits           6 months ago Pure hypercholesterolemia   [place] [facility] Austine Lefort, MD   1 year ago GAD (generalized  anxiety disorder)   [place] [facility] Pickard, Cisco Crest, MD              Passed - Cr in normal range and within 360 days    Creat  Date Value Ref Range Status  09/08/2023 0.78 0.70 - 1.30 mg/dL Final   Creatinine, Ser  Date Value Ref Range Status  01/21/2024 0.80 0.61 - 1.24 mg/dL Final         Passed - Last Heart Rate in normal range    Pulse Readings from Last 1 Encounters:  12/29/23 67            Requested Prescriptions  Pending Prescriptions Disp Refills   clonazePAM  (KLONOPIN ) 0.5 MG tablet [Pharmacy Med Name: CLONAZEPAM  0.5 MG TABLET] 30 tablet 0    Sig: TAKE 1 TABLET (0.5 MG TOTAL) BY MOUTH 3 (THREE) TIMES DAILY AS NEEDED FOR ANXIETY.  Not Delegated - Psychiatry: Anxiolytics/Hypnotics 2 Failed - 03/29/2024  3:04 PM      Failed - This refill cannot be delegated      Failed - Urine Drug Screen completed in last 360 days      Failed - Valid encounter within last 6 months    Recent Outpatient Visits           6 months ago Pure hypercholesterolemia   [place] [facility] Pickard, Cisco Crest, MD   1 year ago GAD (generalized anxiety disorder)   [place] [facility] Pickard, Cisco Crest, MD              Passed - Patient is not pregnant      Refused Prescriptions Disp Refills   carvedilol  (COREG ) 12.5 MG tablet [Pharmacy Med Name: CARVEDILOL  12.5 MG TABLET] 180 tablet 1    Sig: TAKE 1 TABLET (12.5MG  TOTAL) BY MOUTH TWICE A DAY WITH MEALS     Cardiovascular: Beta Blockers 3 Failed - 03/29/2024  3:04 PM      Failed - AST in normal range and within 360 days    AST  Date Value Ref Range Status  01/21/2024 77 (H) 15 - 41 U/L Final         Failed - ALT in normal range and within 360 days    ALT  Date Value Ref Range Status  01/21/2024 89 (H) 0 - 44 U/L Final         Failed - Last BP in normal range    BP Readings from Last 1 Encounters:  03/09/24 (!) 142/83         Failed - Valid encounter  within last 6 months    Recent Outpatient Visits           6 months ago Pure hypercholesterolemia   [place] [facility] Austine Lefort, MD   1 year ago GAD (generalized anxiety disorder)   [place] [facility] Pickard, Cisco Crest, MD              Passed - Cr in normal range and within 360 days    Creat  Date Value Ref Range Status  09/08/2023 0.78 0.70 - 1.30 mg/dL Final   Creatinine, Ser  Date Value Ref Range Status  01/21/2024 0.80 0.61 - 1.24 mg/dL Final         Passed - Last Heart Rate in normal range    Pulse Readings from Last 1 Encounters:  12/29/23 67

## 2024-03-29 NOTE — Telephone Encounter (Signed)
 Rx 12/21/23 #180 1RF-too soon Requested Prescriptions  Pending Prescriptions Disp Refills   clonazePAM  (KLONOPIN ) 0.5 MG tablet [Pharmacy Med Name: CLONAZEPAM  0.5 MG TABLET] 30 tablet 0    Sig: TAKE 1 TABLET (0.5 MG TOTAL) BY MOUTH 3 (THREE) TIMES DAILY AS NEEDED FOR ANXIETY.     Not Delegated - Psychiatry: Anxiolytics/Hypnotics 2 Failed - 03/29/2024  3:04 PM      Failed - This refill cannot be delegated      Failed - Urine Drug Screen completed in last 360 days      Failed - Valid encounter within last 6 months    Recent Outpatient Visits           6 months ago Pure hypercholesterolemia   [place] [facility] Pickard, Cisco Crest, MD   1 year ago GAD (generalized anxiety disorder)   [place] [facility] Pickard, Cisco Crest, MD              Passed - Patient is not pregnant      Refused Prescriptions Disp Refills   carvedilol  (COREG ) 12.5 MG tablet [Pharmacy Med Name: CARVEDILOL  12.5 MG TABLET] 180 tablet 1    Sig: TAKE 1 TABLET (12.5MG  TOTAL) BY MOUTH TWICE A DAY WITH MEALS     Cardiovascular: Beta Blockers 3 Failed - 03/29/2024  3:04 PM      Failed - AST in normal range and within 360 days    AST  Date Value Ref Range Status  01/21/2024 77 (H) 15 - 41 U/L Final         Failed - ALT in normal range and within 360 days    ALT  Date Value Ref Range Status  01/21/2024 89 (H) 0 - 44 U/L Final         Failed - Last BP in normal range    BP Readings from Last 1 Encounters:  03/09/24 (!) 142/83         Failed - Valid encounter within last 6 months    Recent Outpatient Visits           6 months ago Pure hypercholesterolemia   [place] [facility] Austine Lefort, MD   1 year ago GAD (generalized anxiety disorder)   [place] [facility] Pickard, Cisco Crest, MD              Passed - Cr in normal range and within 360 days    Creat  Date Value Ref Range Status  09/08/2023 0.78 0.70 - 1.30  mg/dL Final   Creatinine, Ser  Date Value Ref Range Status  01/21/2024 0.80 0.61 - 1.24 mg/dL Final         Passed - Last Heart Rate in normal range    Pulse Readings from Last 1 Encounters:  12/29/23 67

## 2024-04-30 ENCOUNTER — Other Ambulatory Visit: Payer: Self-pay | Admitting: Family Medicine

## 2024-04-30 DIAGNOSIS — R000 Tachycardia, unspecified: Secondary | ICD-10-CM

## 2024-05-02 ENCOUNTER — Other Ambulatory Visit: Payer: Self-pay

## 2024-05-02 DIAGNOSIS — F411 Generalized anxiety disorder: Secondary | ICD-10-CM

## 2024-05-02 NOTE — Telephone Encounter (Signed)
 Prescription Request  05/02/2024  LOV: 09/08/23  What is the name of the medication or equipment? venlafaxine  XR (EFFEXOR -XR) 150 MG 24 hr capsule [[id number]]   Have you contacted your pharmacy to request a refill? Yes   Which pharmacy would you like this sent to?  [id number] [place], [place] - [address] AT [facility] [address] [place] [address] Phone: [phone] Fax: [phone]    Patient notified that their request is being sent to the clinical staff for review and that they should receive a response within 2 business days.   Please advise at [facility] [phone]

## 2024-05-02 NOTE — Telephone Encounter (Signed)
 Refilled and sent to pharmacy by provider.

## 2024-05-05 MED ORDER — VENLAFAXINE HCL ER 150 MG PO CP24
ORAL_CAPSULE | ORAL | 0 refills | Status: DC
Start: 2024-05-05 — End: 2024-10-06

## 2024-05-05 NOTE — Telephone Encounter (Signed)
 2cd request: Prescription Request  05/05/2024  LOV: 09/08/23  What is the name of the medication or equipment? venlafaxine  XR (EFFEXOR -XR) 150 MG 24 hr capsule [[id number]]   Have you contacted your pharmacy to request a refill? Yes   Which pharmacy would you like this sent to?  [id number] - [place], [place] - [address] AT [facility] [address] [place] [address] Phone: [phone] Fax: [phone]    Patient notified that their request is being sent to the clinical staff for review and that they should receive a response within 2 business days.   Please advise at [facility] [phone]

## 2024-05-05 NOTE — Telephone Encounter (Signed)
 Patient needs to be called to schedule appt for medication refills. Last OV 09/08/23.

## 2024-05-05 NOTE — Telephone Encounter (Signed)
 Requested Prescriptions  Pending Prescriptions Disp Refills   venlafaxine  XR (EFFEXOR -XR) 150 MG 24 hr capsule 90 capsule 0    Sig: TAKE 1 CAPSULE BY MOUTH EVERY DAY IN THE MORNING. OFFICE VISIT NEEDED FOR ADDITIONAL REFILLS     Psychiatry: Antidepressants - SNRI - desvenlafaxine & venlafaxine  Failed - 05/05/2024  2:46 PM      Failed - Last BP in normal range    BP Readings from Last 1 Encounters:  03/09/24 (!) 142/83         Failed - Valid encounter within last 6 months    Recent Outpatient Visits           8 months ago Pure hypercholesterolemia   [place] [facility] Pickard, Cisco Crest, MD   1 year ago GAD (generalized anxiety disorder)    [facility] Austine Lefort, MD              Failed - Lipid Panel in normal range within the last 12 months    Cholesterol  Date Value Ref Range Status  09/08/2023 174 <200 mg/dL Final   LDL Cholesterol (Calc)  Date Value Ref Range Status  09/08/2023 98 mg/dL (calc) Final    Comment:    Reference range: <100 . Desirable range <100 mg/dL for primary prevention;   <70 mg/dL for patients with CHD or diabetic patients  with > or = 2 CHD risk factors. Aaron Aas LDL-C is now calculated using the Martin-Hopkins  calculation, which is a validated novel method providing  better accuracy than the Friedewald equation in the  estimation of LDL-C.  Melinda Sprawls et al. Erroll Heard. 5784;696(29): 2061-2068  (http://education.[employer].com/faq/FAQ164)    HDL  Date Value Ref Range Status  09/08/2023 53 > OR = 40 mg/dL Final   Triglycerides  Date Value Ref Range Status  09/08/2023 132 <150 mg/dL Final         Passed - Cr in normal range and within 360 days    Creat  Date Value Ref Range Status  09/08/2023 0.78 0.70 - 1.30 mg/dL Final   Creatinine, Ser  Date Value Ref Range Status  01/21/2024 0.80 0.61 - 1.24 mg/dL Final

## 2024-06-04 ENCOUNTER — Other Ambulatory Visit: Payer: Self-pay | Admitting: Family Medicine

## 2024-06-09 ENCOUNTER — Telehealth: Payer: Self-pay | Admitting: Family Medicine

## 2024-06-09 NOTE — Telephone Encounter (Signed)
 Prescription Request  06/09/2024  LOV: 09/08/2023  What is the name of the medication or equipment?   amLODipine  (NORVASC ) 10 MG tablet  **90 day script request**  Have you contacted your pharmacy to request a refill? Yes   Which pharmacy would you like this sent to?  [id number] [place], [place] - [address] AT [facility] [address] [place] [address] Phone: [phone] Fax: [phone]    Patient notified that their request is being sent to the clinical staff for review and that they should receive a response within 2 business days.   Please advise pharmacist.

## 2024-06-10 ENCOUNTER — Other Ambulatory Visit: Payer: Self-pay

## 2024-06-10 DIAGNOSIS — I10 Essential (primary) hypertension: Secondary | ICD-10-CM

## 2024-06-10 MED ORDER — AMLODIPINE BESYLATE 10 MG PO TABS: 10.0000 mg | ORAL_TABLET | Freq: Every day | ORAL | 0 refills | Status: DC

## 2024-06-10 NOTE — Telephone Encounter (Signed)
 This pt. Needs a physical to gain this medication refill.  We can offer a 30 day supply until the physical can be obtained however, he needs to be on the schedule first.  I attempted to call him and his wife to schedule and was not successful.  If they happen to call back please schedule for a physical in the first available and let me know so that I ca refill this med.   Pease and thank you both in advance. :)

## 2024-06-13 DIAGNOSIS — J440 Chronic obstructive pulmonary disease with acute lower respiratory infection: Secondary | ICD-10-CM | POA: Diagnosis not present

## 2024-06-28 ENCOUNTER — Ambulatory Visit: Admitting: Family Medicine

## 2024-06-28 ENCOUNTER — Encounter: Payer: Self-pay | Admitting: Family Medicine

## 2024-06-28 VITALS — BP 130/76 | HR 78 | Temp 97.9°F | Ht 68.0 in | Wt 222.8 lb

## 2024-06-28 DIAGNOSIS — Z0001 Encounter for general adult medical examination with abnormal findings: Secondary | ICD-10-CM | POA: Diagnosis not present

## 2024-06-28 DIAGNOSIS — F17200 Nicotine dependence, unspecified, uncomplicated: Secondary | ICD-10-CM | POA: Diagnosis not present

## 2024-06-28 DIAGNOSIS — F411 Generalized anxiety disorder: Secondary | ICD-10-CM | POA: Diagnosis not present

## 2024-06-28 DIAGNOSIS — E7800 Pure hypercholesterolemia, unspecified: Secondary | ICD-10-CM

## 2024-06-28 DIAGNOSIS — Z125 Encounter for screening for malignant neoplasm of prostate: Secondary | ICD-10-CM

## 2024-06-28 DIAGNOSIS — Z0000 Encounter for general adult medical examination without abnormal findings: Secondary | ICD-10-CM

## 2024-06-28 DIAGNOSIS — K2270 Barrett's esophagus without dysplasia: Secondary | ICD-10-CM

## 2024-06-28 DIAGNOSIS — Z122 Encounter for screening for malignant neoplasm of respiratory organs: Secondary | ICD-10-CM

## 2024-06-28 NOTE — Progress Notes (Signed)
 Subjective:    Patient ID: [name], male    DOB: [birth year], 58 y.o.   MRN: [id number]  Patient is a very pleasant 58 year old Caucasian gentleman here today for complete physical exam.  Regarding his immunizations, he is due for the shingles vaccine.  He is also due for a tetanus shot.  He defers these at the present time.  Regarding his cancer screening, he is due for a PSA to screen for prostate cancer.  Given his smoking history he is due for a CT scan of the lungs to screen for lung cancer.  He had a colonoscopy last year.  There were tubular adenomas on biopsy.  They recommended repeating colonoscopy in 5 years.  However he had Barrett's esophagus on EGD.  They recommended EGD again in 1 year.  He has not been contacted for this yet.  Therefore I placed an order for this.  Given his history, he has smoked for more than 20 years over a pack a day, I recommended a CT scan of the lungs to screen for lung cancer.  He is also due for a PSA to check for prostate cancer. Past Medical History:  Diagnosis Date   Allergy    Anemia    Colonic diverticular abscess 12/26/2014   Diverticulitis    ETOH abuse    GAD (generalized anxiety disorder)    GERD (gastroesophageal reflux disease)    History of fainting spells of unknown cause    Hypercholesteremia    Hypertension    Left rotator cuff tear 02/08/2019   Sleep apnea    Superior labrum anterior-to-posterior (SLAP) tear of left shoulder 02/08/2019   Tobacco abuse    Past Surgical History:  Procedure Laterality Date   APPENDECTOMY N/A 01/01/2015   Procedure: INCIDENTAL APPENDECTOMY;  Surgeon: Oneil DELENA Budge, MD;  Location: [facility];  Service: General;  Laterality: N/A;   BACK SURGERY     lower back, fusion   BIOPSY  07/16/2023   Procedure: BIOPSY;  Surgeon: San Sandor GAILS, DO;  Location: [facility];  Service: Gastroenterology;;   COLONOSCOPY WITH PROPOFOL  N/A 12/21/2017   Procedure: COLONOSCOPY WITH PROPOFOL ;  Surgeon: Shaaron Lamar HERO, MD;  Location: [facility];  Service: Endoscopy;  Laterality: N/A;  9:45am   COLONOSCOPY WITH PROPOFOL  N/A 07/16/2023   Procedure: COLONOSCOPY WITH PROPOFOL ;  Surgeon: San Sandor GAILS, DO;  Location: [facility];  Service: Gastroenterology;  Laterality: N/A;   ESOPHAGOGASTRODUODENOSCOPY (EGD) WITH PROPOFOL  N/A 07/16/2023   Procedure: ESOPHAGOGASTRODUODENOSCOPY (EGD) WITH PROPOFOL ;  Surgeon: San Sandor GAILS, DO;  Location: [facility];  Service: Gastroenterology;  Laterality: N/A;   GASTRECTOMY N/A 04/14/2016   Procedure: ELIGIO;  Surgeon: Oneil Budge, MD;  Location: [facility];  Service: General;  Laterality: N/A;   HEMORRHOID SURGERY     HERNIA REPAIR     as child   HOT HEMOSTASIS N/A 07/16/2023   Procedure: HOT HEMOSTASIS (ARGON PLASMA COAGULATION/BICAP);  Surgeon: San Sandor GAILS, DO;  Location: [facility];  Service: Gastroenterology;  Laterality: N/A;   LOOP RECORDER INSERTION N/A 09/06/2021   Procedure: LOOP RECORDER INSERTION;  Surgeon: Francyne Headland, MD;  [facility];  Service: Cardiovascular;  Laterality: N/A;   PARTIAL COLECTOMY N/A 01/01/2015   Procedure: PARTIAL COLECTOMY;  Surgeon: Oneil DELENA Budge, MD;  Location: [facility];  Service: General;  Laterality: N/A;   POLYPECTOMY  12/21/2017   Procedure: POLYPECTOMY;  Surgeon: Shaaron Lamar HERO, MD;  [facility];  Service: Endoscopy;;  colon   POLYPECTOMY  07/16/2023   Procedure: POLYPECTOMY;  Surgeon: San Sandor GAILS, DO;  [facility];  Service: Gastroenterology;;   ROTATOR CUFF REPAIR Bilateral    SHOULDER ARTHROSCOPY WITH ROTATOR CUFF REPAIR AND SUBACROMIAL DECOMPRESSION Left 02/09/2019   Procedure: SHOULDER ARTHROSCOPY WITH ROTATOR CUFF REPAIR AND SUBACROMIAL PARTIAL ACROMIOPLASTY, DISTAL CLAVICULECTOMY, AND EXTENSIVE DEBRIDEMENT;  Surgeon: Jane Charleston, MD;  Location: [facility];  Service: Orthopedics;  Laterality: Left;   Current Outpatient Medications on  File Prior to Visit  Medication Sig Dispense Refill   amLODipine  (NORVASC ) 10 MG tablet Take 1 tablet (10 mg total) by mouth daily. 30 tablet 0   atorvastatin  (LIPITOR) 40 MG tablet TAKE 1 TABLET BY MOUTH EVERY DAY 90 tablet 1   carvedilol  (COREG ) 12.5 MG tablet TAKE 1 TABLET (12.5MG  TOTAL) BY MOUTH TWICE A DAY WITH MEALS 180 tablet 1   clindamycin  (CLEOCIN  T) 1 % SWAB APPLY TO AFFECTED AREA TWICE A DAY 60 each 3   clonazePAM  (KLONOPIN ) 0.5 MG tablet TAKE 1 TABLET (0.5 MG TOTAL) BY MOUTH 3 (THREE) TIMES DAILY AS NEEDED FOR ANXIETY. 30 tablet 0   fluticasone  (FLONASE ) 50 MCG/ACT nasal spray SPRAY 2 SPRAYS INTO EACH NOSTRIL EVERY DAY 48 mL 3   losartan  (COZAAR ) 50 MG tablet Take 1 tablet (50 mg total) by mouth daily. 90 tablet 3   Menthol  (BIOFREEZE) 10.5 % AERO Apply 1-2 sprays topically as needed (pain).     pantoprazole  (PROTONIX ) 40 MG tablet Take 1 tablet (40 mg total) by mouth daily. 90 tablet 1   sildenafil  (VIAGRA ) 100 MG tablet TAKE 1 TABLET BY MOUTH EVERY DAY AS NEEDED FOR ERECTILE DYSFUNCTION 10 tablet 11   venlafaxine  XR (EFFEXOR -XR) 150 MG 24 hr capsule TAKE 1 CAPSULE BY MOUTH EVERY DAY IN THE MORNING. OFFICE VISIT NEEDED FOR ADDITIONAL REFILLS 90 capsule 0   cyclobenzaprine  (FLEXERIL ) 10 MG tablet TAKE 1 TABLET BY MOUTH EVERYDAY AT BEDTIME (Patient not taking: Reported on 06/28/2024) 30 tablet 2   No current facility-administered medications on file prior to visit.   No Known Allergies Social History   Socioeconomic History   Marital status: Married    Spouse name: Not on file   Number of children: Not on file   Years of education: Not on file   Highest education level: Associate degree: occupational, Scientist, product/process development, or vocational program  Occupational History   Not on file  Tobacco Use   Smoking status: Every Day    Current packs/day: 1.00    Average packs/day: 1 pack/day for 35.0 years (35.0 ttl pk-yrs)    Types: Cigarettes    Passive exposure: Current (Wife is a smoker)    Smokeless tobacco: Never   Tobacco comments:    1.5 ppd since age 39.   Vaping Use   Vaping status: Never Used  Substance and Sexual Activity   Alcohol use: Yes    Alcohol/week: 48.0 standard drinks of alcohol    Types: 48 Cans of beer per week    Comment: patient states about 5 or 6 a day (beers)   Drug use: No   Sexual activity: Yes  Other Topics Concern   Not on file  Social History Narrative   Not on file   Social Drivers of Health   Financial Resource Strain: Low Risk  (06/24/2024)   Overall Financial Resource Strain (CARDIA)    Difficulty of Paying Living Expenses: Not very hard  Food Insecurity: No Food Insecurity (06/24/2024)   Hunger Vital  Sign    Worried About Programme researcher, broadcasting/film/video in the Last Year: Never true    Ran Out of Food in the Last Year: Never true  Transportation Needs: Unmet Transportation Needs (06/24/2024)   PRAPARE - Administrator, Civil Service (Medical): Yes    Lack of Transportation (Non-Medical): Yes  Physical Activity: Insufficiently Active (06/24/2024)   Exercise Vital Sign    Days of Exercise per Week: 1 day    Minutes of Exercise per Session: 10 min  Stress: Stress Concern Present (06/24/2024)   [employer] of Occupational Health - Occupational Stress Questionnaire    Feeling of Stress: To some extent  Social Connections: Moderately Isolated (06/24/2024)   Social Connection and Isolation Panel    Frequency of Communication with Friends and Family: Three times a week    Frequency of Social Gatherings with Friends and Family: Once a week    Attends Religious Services: Never    Database administrator or Organizations: No    Attends Engineer, structural: Not on file    Marital Status: Married  Catering manager Violence: Not At Risk (03/09/2024)   Humiliation, Afraid, Rape, and Kick questionnaire    Fear of Current or Ex-Partner: No    Emotionally Abused: No    Physically Abused: No    Sexually Abused: No      Review of  Systems  All other systems reviewed and are negative.      Objective:   Physical Exam Vitals reviewed.  Constitutional:      Appearance: He is well-developed.  Eyes:     Conjunctiva/sclera: Conjunctivae normal.     Pupils: Pupils are equal, round, and reactive to light.  Cardiovascular:     Rate and Rhythm: Normal rate and regular rhythm.     Heart sounds: Normal heart sounds. No murmur heard. Pulmonary:     Effort: Pulmonary effort is normal. No respiratory distress.     Breath sounds: Normal breath sounds. No wheezing or rales.  Abdominal:     General: Bowel sounds are normal. There is no distension.     Palpations: Abdomen is soft.     Tenderness: There is no abdominal tenderness. There is no guarding or rebound.  Musculoskeletal:     Cervical back: Neck supple.  Lymphadenopathy:     Cervical: No cervical adenopathy.  Neurological:     Mental Status: He is alert and oriented to person, place, and time.     Cranial Nerves: No cranial nerve deficit.     Motor: No abnormal muscle tone.     Coordination: Coordination normal.     Deep Tendon Reflexes: Reflexes are normal and symmetric.           Assessment & Plan:   Barrett's esophagus without dysplasia - Plan: Ambulatory referral to Gastroenterology  Pure hypercholesterolemia - Plan: CBC with Differential/Platelet, Comprehensive metabolic panel with GFR, Lipid panel  Prostate cancer screening - Plan: PSA  Screening for lung cancer - Plan: CT CHEST LUNG CA SCREEN LOW DOSE W/O CM  Smoker  GAD (generalized anxiety disorder)  General medical exam First, recommended smoking cessation.  Second, recommended a lung cancer screening with a CT scan of the lung.  Colonoscopy is up-to-date but I will consult GI to arrange an EGD to monitor for Barrett's esophagus.  Blood pressure today is well-controlled.  I will check a CBC a CMP and a lipid panel to monitor his cholesterol.  Ideally I like to see his  LDL cholesterol less  than 100.  I will check a PSA to screen for prostate cancer.  I recommended the shingles vaccine.  I recommended a tetanus shot.  The patient declines both vaccines today.

## 2024-06-29 LAB — PSA: PSA: 6.16 ng/mL — ABNORMAL HIGH (ref <=4.00)

## 2024-06-29 LAB — LIPID PANEL
Cholesterol: 161 mg/dL (ref <200)
HDL: 50 mg/dL (ref >=40)
LDL Cholesterol (Calc): 87 mg/dL
Non-HDL Cholesterol (Calc): 111 mg/dL (ref <130)
Total CHOL/HDL Ratio: 3.2 (calc) (ref <5.0)
Triglycerides: 141 mg/dL (ref <150)

## 2024-06-29 LAB — COMPREHENSIVE METABOLIC PANEL WITH GFR
AG Ratio: 1.3 (calc) (ref 1.0–2.5)
ALT: 51 U/L — ABNORMAL HIGH (ref 9–46)
AST: 46 U/L — ABNORMAL HIGH (ref 10–35)
Albumin: 3.9 g/dL (ref 3.6–5.1)
Alkaline phosphatase (APISO): 100 U/L (ref 35–144)
BUN: 12 mg/dL (ref 7–25)
CO2: 27 mmol/L (ref 20–32)
Calcium: 9.3 mg/dL (ref 8.6–10.3)
Chloride: 97 mmol/L — ABNORMAL LOW (ref 98–110)
Creat: 0.71 mg/dL (ref 0.70–1.30)
Globulin: 2.9 g/dL (ref 1.9–3.7)
Glucose, Bld: 140 mg/dL — ABNORMAL HIGH (ref 65–99)
Potassium: 4.3 mmol/L (ref 3.5–5.3)
Sodium: 134 mmol/L — ABNORMAL LOW (ref 135–146)
Total Bilirubin: 0.3 mg/dL (ref 0.2–1.2)
Total Protein: 6.8 g/dL (ref 6.1–8.1)
eGFR: 106 mL/min/1.73m2 (ref >=60)

## 2024-06-29 LAB — CBC WITH DIFFERENTIAL/PLATELET
Absolute Lymphocytes: 1900 {cells}/uL (ref 850–3900)
Absolute Monocytes: 1251 {cells}/uL — ABNORMAL HIGH (ref 200–950)
Basophils Absolute: 83 {cells}/uL (ref 0–200)
Basophils Relative: 0.7 %
Eosinophils Absolute: 224 {cells}/uL (ref 15–500)
Eosinophils Relative: 1.9 %
HCT: 47.1 % (ref 38.5–50.0)
Hemoglobin: 16 g/dL (ref 13.2–17.1)
MCH: 33.1 pg — ABNORMAL HIGH (ref 27.0–33.0)
MCHC: 34 g/dL (ref 32.0–36.0)
MCV: 97.3 fL (ref 80.0–100.0)
MPV: 10 fL (ref 7.5–12.5)
Monocytes Relative: 10.6 %
Neutro Abs: 8343 {cells}/uL — ABNORMAL HIGH (ref 1500–7800)
Neutrophils Relative %: 70.7 %
Platelets: 284 Thousand/uL (ref 140–400)
RBC: 4.84 Million/uL (ref 4.20–5.80)
RDW: 12.8 % (ref 11.0–15.0)
Total Lymphocyte: 16.1 %
WBC: 11.8 Thousand/uL — ABNORMAL HIGH (ref 3.8–10.8)

## 2024-06-30 ENCOUNTER — Ambulatory Visit: Payer: Self-pay | Admitting: Family Medicine

## 2024-07-04 ENCOUNTER — Other Ambulatory Visit: Payer: Self-pay | Admitting: Family Medicine

## 2024-07-04 ENCOUNTER — Telehealth: Payer: Self-pay | Admitting: Family Medicine

## 2024-07-04 NOTE — Telephone Encounter (Signed)
 Prescription Request  07/04/2024  LOV: 06/28/2024  What is the name of the medication or equipment?   clonazePAM  (KLONOPIN ) 0.5 MG tablet   Have you contacted your pharmacy to request a refill? Yes   Which pharmacy would you like this sent to?  [id number] [place], [place] - [address] AT [facility] [address] [place] [address] Phone: [phone] Fax: [phone]    Patient notified that their request is being sent to the clinical staff for review and that they should receive a response within 2 business days.   Please advise pharmacist.

## 2024-07-05 ENCOUNTER — Other Ambulatory Visit: Payer: Self-pay | Admitting: Family Medicine

## 2024-07-05 MED ORDER — CLONAZEPAM 0.5 MG PO TABS: 0.5000 mg | ORAL_TABLET | Freq: Three times a day (TID) | ORAL | 0 refills | Status: DC | PRN

## 2024-07-07 DIAGNOSIS — J189 Pneumonia, unspecified organism: Secondary | ICD-10-CM | POA: Diagnosis not present

## 2024-07-11 ENCOUNTER — Telehealth: Payer: Self-pay | Admitting: Acute Care

## 2024-07-11 ENCOUNTER — Encounter: Payer: Self-pay | Admitting: Internal Medicine

## 2024-07-11 DIAGNOSIS — F17210 Nicotine dependence, cigarettes, uncomplicated: Secondary | ICD-10-CM

## 2024-07-11 DIAGNOSIS — Z122 Encounter for screening for malignant neoplasm of respiratory organs: Secondary | ICD-10-CM

## 2024-07-11 DIAGNOSIS — Z87891 Personal history of nicotine dependence: Secondary | ICD-10-CM

## 2024-07-11 NOTE — Telephone Encounter (Signed)
 Lung Cancer Screening Narrative/Criteria Questionnaire (Cigarette Smokers Only- No Cigars/Pipes/vapes)   [name]   SDMV:07/13/2024 10:45 [place]        12-05-1965   LDCT: 07/20/2024 12:30 [facility]    57 y.o.   Phone: [phone]  Lung Screening Narrative (confirm age 75-77 yrs Medicare / 50-80 yrs Private pay insurance)   Insurance information:HTA   Referring Provider:Dr. Duanne   This screening involves an initial phone call with a team member from our program. It is called a shared decision making visit. The initial meeting is required by  insurance and Medicare to make sure you understand the program. This appointment takes about 15-20 minutes to complete. You will complete the screening scan at your scheduled date/time.  This scan takes about 5-10 minutes to complete. You can eat and drink normally before and after the scan.  Criteria questions for Lung Cancer Screening:   Are you a current or former smoker? Current Age began smoking: 58yo   If you are a former smoker, what year did you quit smoking? N/A(within 15 yrs)   To calculate your smoking history, I need an accurate estimate of how many packs of cigarettes you smoked per day and for how many years. (Not just the number of PPD you are now smoking)   Years smoking 43 x Packs per day 1 = Pack years 43   (at least 20 pack yrs)   (Make sure they understand that we need to know how much they have smoked in the past, not just the number of PPD they are smoking now)  Do you have a personal history of cancer?  No    Do you have a family history of cancer? No  Are you coughing up blood?  No  Have you had unexplained weight loss of 15 lbs or more in the last 6 months? No  It looks like you meet all criteria.  When would be a good time for us  to schedule you for this screening?   Additional information: N/A

## 2024-07-13 ENCOUNTER — Ambulatory Visit: Admitting: Adult Health

## 2024-07-13 ENCOUNTER — Encounter: Payer: Self-pay | Admitting: Adult Health

## 2024-07-13 DIAGNOSIS — F17210 Nicotine dependence, cigarettes, uncomplicated: Secondary | ICD-10-CM | POA: Diagnosis not present

## 2024-07-13 NOTE — Progress Notes (Signed)
  Virtual Visit via Telephone Note  I connected with URBANO MILHOUSE , 07/13/24 10:51 AM by a telemedicine application and verified that I am speaking with the correct person using two identifiers.  Location: Patient: home Provider: home   I discussed the limitations of evaluation and management by telemedicine and the availability of in person appointments. The patient expressed understanding and agreed to proceed.   Shared Decision Making Visit Lung Cancer Screening Program [phone])   Eligibility: 58 y.o. Pack Years Smoking History Calculation = 43 pack years  (# packs/per year x # years smoked) Recent History of coughing up blood  no Unexplained weight loss? no ( >Than 15 pounds within the last 6 months ) Prior History Lung / other cancer no (Diagnosis within the last 5 years already requiring surveillance chest CT Scans). Smoking Status Current Smoker   Visit Components: Discussion included one or more decision making aids. YES Discussion included risk/benefits of screening. YES Discussion included potential follow up diagnostic testing for abnormal scans. YES Discussion included meaning and risk of over diagnosis. YES Discussion included meaning and risk of False Positives. YES Discussion included meaning of total radiation exposure. YES  Counseling Included: Importance of adherence to annual lung cancer LDCT screening. YES Impact of comorbidities on ability to participate in the program. YES Ability and willingness to under diagnostic treatment. YES  Smoking Cessation Counseling: Current Smokers:  Discussed importance of smoking cessation. yes Information about tobacco cessation classes and interventions provided to patient. yes Patient provided with ticket for LDCT Scan. yes Symptomatic Patient. NO Diagnosis Code: Tobacco Use Z72.0 Asymptomatic Patient yes  Counseling - 4 minutes of smoking cessation counseling (CT Chest Lung Cancer Screening Low Dose W/O  CM) [id number]  Z12.2-Screening of respiratory organs Z87.891-Personal history of nicotine  dependence   Lamarr Myers 07/13/24

## 2024-07-13 NOTE — Patient Instructions (Signed)

## 2024-07-15 ENCOUNTER — Encounter: Payer: Self-pay | Admitting: Radiology

## 2024-07-20 ENCOUNTER — Ambulatory Visit (HOSPITAL_COMMUNITY)
Admission: RE | Admit: 2024-07-20 | Discharge: 2024-07-20 | Disposition: A | Discharge status: Home / Self Care | Source: Ambulatory Visit | Attending: Acute Care | Admitting: Acute Care

## 2024-07-20 DIAGNOSIS — F17210 Nicotine dependence, cigarettes, uncomplicated: Secondary | ICD-10-CM | POA: Diagnosis not present

## 2024-07-20 DIAGNOSIS — Z87891 Personal history of nicotine dependence: Secondary | ICD-10-CM | POA: Insufficient documentation

## 2024-07-20 DIAGNOSIS — Z122 Encounter for screening for malignant neoplasm of respiratory organs: Secondary | ICD-10-CM | POA: Diagnosis not present

## 2024-08-01 ENCOUNTER — Telehealth: Payer: Self-pay | Admitting: Acute Care

## 2024-08-01 NOTE — Telephone Encounter (Signed)
 Call report:    IMPRESSION: 1. Lung-RADS 1, negative. Continue annual screening with low-dose chest CT without contrast in 12 months. 2. Prevascular and AP window adenopathy, worrisome for metastatic disease. Early small cell lung cancer or a lymphoproliferative disorder not excluded. These results will be called to the ordering clinician or representative by the Radiologist Assistant, and communication documented in the PACS or [employer]. 3.  Age advanced three-vessel coronary artery calcification. 4. Liver is steatotic. 5.  Aortic atherosclerosis (ICD10-I70.0). 6.  Emphysema (ICD10-J43.9).     Electronically Signed   By: Newell Eke M.D.   On: 08/01/2024 16:05

## 2024-08-02 ENCOUNTER — Other Ambulatory Visit: Payer: Self-pay | Admitting: Family Medicine

## 2024-08-02 ENCOUNTER — Ambulatory Visit: Payer: Self-pay | Admitting: Family Medicine

## 2024-08-02 ENCOUNTER — Telehealth: Payer: Self-pay

## 2024-08-02 DIAGNOSIS — R590 Localized enlarged lymph nodes: Secondary | ICD-10-CM

## 2024-08-02 NOTE — Telephone Encounter (Signed)
 [name] the patient has been called to schedule his Urgent NM PET IMAGE INITIAL exam but he has not returned the call.  So we just scheduled him an appointment since it's urgent.  His appt is on Thurs 08/04/2024 at 8:00am, arrival time of 7:30am at [facility].  I sent a mychart message to the patient, but just wanted to let you know.

## 2024-08-03 ENCOUNTER — Other Ambulatory Visit: Payer: Self-pay | Admitting: Family Medicine

## 2024-08-03 NOTE — Telephone Encounter (Signed)
 Lauraine Lites, NP has reviewed lung screening CT results and has messaged Dr Duanne regarding the PET scan that has been ordered. We will put patient on the reminder list to follow PET results. And will plan for a 12 month repeat LDCT if PET is negative.

## 2024-08-04 ENCOUNTER — Encounter (HOSPITAL_COMMUNITY)

## 2024-08-11 ENCOUNTER — Encounter (HOSPITAL_COMMUNITY)
Admission: RE | Admit: 2024-08-11 | Discharge: 2024-08-11 | Disposition: A | Discharge status: Home / Self Care | Source: Ambulatory Visit | Attending: Family Medicine | Admitting: Family Medicine

## 2024-08-11 DIAGNOSIS — R935 Abnormal findings on diagnostic imaging of other abdominal regions, including retroperitoneum: Secondary | ICD-10-CM | POA: Diagnosis not present

## 2024-08-11 DIAGNOSIS — R590 Localized enlarged lymph nodes: Secondary | ICD-10-CM | POA: Insufficient documentation

## 2024-08-11 MED ORDER — FLUDEOXYGLUCOSE F - 18 (FDG) INJECTION
11.8400 | Freq: Once | INTRAVENOUS | Status: AC | PRN
  Administered 2024-08-11: 11.84 via INTRAVENOUS

## 2024-08-12 ENCOUNTER — Ambulatory Visit: Payer: Self-pay | Admitting: Family Medicine

## 2024-08-16 ENCOUNTER — Other Ambulatory Visit: Payer: Self-pay

## 2024-08-16 DIAGNOSIS — R590 Localized enlarged lymph nodes: Secondary | ICD-10-CM

## 2024-08-23 ENCOUNTER — Ambulatory Visit (INDEPENDENT_AMBULATORY_CARE_PROVIDER_SITE_OTHER): Admitting: Internal Medicine

## 2024-08-23 VITALS — BP 136/83 | HR 76 | Temp 98.8°F | Ht 68.0 in | Wt 230.4 lb

## 2024-08-23 DIAGNOSIS — R7989 Other specified abnormal findings of blood chemistry: Secondary | ICD-10-CM

## 2024-08-23 DIAGNOSIS — Z860101 Personal history of adenomatous and serrated colon polyps: Secondary | ICD-10-CM | POA: Diagnosis not present

## 2024-08-23 DIAGNOSIS — K2270 Barrett's esophagus without dysplasia: Secondary | ICD-10-CM | POA: Diagnosis not present

## 2024-08-23 DIAGNOSIS — F1090 Alcohol use, unspecified, uncomplicated: Secondary | ICD-10-CM

## 2024-08-23 NOTE — Progress Notes (Unsigned)
 Gastroenterology Progress Note    Primary Care Physician:  Duanne Butler DASEN, MD Primary Gastroenterologist:  Dr. Shaaron  Pre-Procedure History & Physical: HPI:  [name] is a 58 y.o. male here for follow-up short-segment Barrett's without dysplasia diagnosed last year at [facility].  History of 5 small adenomas removed last year [facility].  Could not be done here due to implanted loop recorder.  Reflux well-controlled on Protonix .  No dysphagia.  No bowel issues.  Chronically elevated liver enzymes most likely secondary to excessive alcohol use on a regular basis (beer).  Extensive serological workup for other causes of transaminitis come back negative.  Has historically been on a statin with a has been discontinued.  No evidence of portal hypertension on prior EGD.  Last elastography 5K PA but data quality limited.  He needs to have a repeat EGD and colonoscopy 4 years from now.  Past Medical History:  Diagnosis Date   Allergy    Anemia    Colonic diverticular abscess 12/26/2014   Diverticulitis    ETOH abuse    GAD (generalized anxiety disorder)    GERD (gastroesophageal reflux disease)    History of fainting spells of unknown cause    Hypercholesteremia    Hypertension    Left rotator cuff tear 02/08/2019   Sleep apnea    Superior labrum anterior-to-posterior (SLAP) tear of left shoulder 02/08/2019   Tobacco abuse     Past Surgical History:  Procedure Laterality Date   APPENDECTOMY N/A 01/01/2015   Procedure: INCIDENTAL APPENDECTOMY;  Surgeon: Oneil DELENA Budge, MD;  [facility]: [facility];  Service: General;  Laterality: N/A;   BACK SURGERY     lower back, fusion   BIOPSY  07/16/2023   Procedure: BIOPSY;  Surgeon: San Sandor GAILS, DO;  [facility];  Service: Gastroenterology;;   COLONOSCOPY WITH PROPOFOL  N/A 12/21/2017   Procedure: COLONOSCOPY WITH PROPOFOL ;  Surgeon: Shaaron Lamar HERO, MD;  [facility]: [facility];  Service: Endoscopy;  Laterality: N/A;  9:45am    COLONOSCOPY WITH PROPOFOL  N/A 07/16/2023   Procedure: COLONOSCOPY WITH PROPOFOL ;  Surgeon: San Sandor GAILS, DO;  [facility];  Service: Gastroenterology;  Laterality: N/A;   ESOPHAGOGASTRODUODENOSCOPY (EGD) WITH PROPOFOL  N/A 07/16/2023   Procedure: ESOPHAGOGASTRODUODENOSCOPY (EGD) WITH PROPOFOL ;  Surgeon: San Sandor GAILS, DO;  [facility];  Service: Gastroenterology;  Laterality: N/A;   GASTRECTOMY N/A 04/14/2016   Procedure: ELIGIO;  Surgeon: Oneil Budge, MD;  Location: [facility];  Service: General;  Laterality: N/A;   HEMORRHOID SURGERY     HERNIA REPAIR     as child   HOT HEMOSTASIS N/A 07/16/2023   Procedure: HOT HEMOSTASIS (ARGON PLASMA COAGULATION/BICAP);  Surgeon: San Sandor GAILS, DO;  Location: [facility];  Service: Gastroenterology;  Laterality: N/A;   LOOP RECORDER INSERTION N/A 09/06/2021   Procedure: LOOP RECORDER INSERTION;  Surgeon: Francyne Headland, MD;  [facility];  Service: Cardiovascular;  Laterality: N/A;   PARTIAL COLECTOMY N/A 01/01/2015   Procedure: PARTIAL COLECTOMY;  Surgeon: Oneil DELENA Budge, MD;  Location: [facility];  Service: General;  Laterality: N/A;   POLYPECTOMY  12/21/2017   Procedure: POLYPECTOMY;  Surgeon: Shaaron Lamar HERO, MD;  [facility];  Service: Endoscopy;;  colon   POLYPECTOMY  07/16/2023   Procedure: POLYPECTOMY;  Surgeon: San Sandor GAILS, DO;  [facility];  Service: Gastroenterology;;   ROTATOR CUFF REPAIR Bilateral    SHOULDER ARTHROSCOPY WITH ROTATOR CUFF REPAIR AND SUBACROMIAL DECOMPRESSION Left 02/09/2019  Procedure: SHOULDER ARTHROSCOPY WITH ROTATOR CUFF REPAIR AND SUBACROMIAL PARTIAL ACROMIOPLASTY, DISTAL CLAVICULECTOMY, AND EXTENSIVE DEBRIDEMENT;  Surgeon: Jane Charleston, MD;  Location: [facility];  Service: Orthopedics;  Laterality: Left;    Prior to Admission medications   Medication Sig Start Date End Date Taking? Authorizing Provider  albuterol  (VENTOLIN   HFA) 108 (90 Base) MCG/ACT inhaler Inhale 2 puffs into the lungs every 4 (four) hours as needed. 06/13/24  Yes [provider]  amLODipine  (NORVASC ) 10 MG tablet Take 1 tablet (10 mg total) by mouth daily. 06/10/24  Yes Duanne Butler DASEN, MD  carvedilol  (COREG ) 12.5 MG tablet TAKE 1 TABLET (12.5MG  TOTAL) BY MOUTH TWICE A DAY WITH MEALS 05/03/24  Yes Duanne Butler DASEN, MD  clonazePAM  (KLONOPIN ) 0.5 MG tablet TAKE 1 TABLET (0.5 MG TOTAL) BY MOUTH 3 (THREE) TIMES DAILY AS NEEDED FOR ANXIETY. 08/04/24  Yes Duanne Butler DASEN, MD  cyclobenzaprine  (FLEXERIL ) 10 MG tablet TAKE 1 TABLET BY MOUTH EVERYDAY AT BEDTIME Patient taking differently: Take 10 mg by mouth at bedtime as needed for muscle spasms. TAKE 1 TABLET BY MOUTH EVERYDAY AT BEDTIME 10/02/20  Yes Duanne Butler DASEN, MD  fluticasone  (FLONASE ) 50 MCG/ACT nasal spray SPRAY 2 SPRAYS INTO EACH NOSTRIL EVERY DAY Patient taking differently: Place 2 sprays into both nostrils daily as needed. SPRAY 2 SPRAYS INTO EACH NOSTRIL EVERY DAY 12/21/23  Yes Duanne Butler DASEN, MD  losartan  (COZAAR ) 50 MG tablet Take 1 tablet (50 mg total) by mouth daily. 02/01/24  Yes Duanne Butler DASEN, MD  Menthol  (BIOFREEZE) 10.5 % AERO Apply 1-2 sprays topically as needed (pain).   Yes [provider]  pantoprazole  (PROTONIX ) 40 MG tablet Take 1 tablet (40 mg total) by mouth daily. 02/01/24  Yes Duanne Butler DASEN, MD  sildenafil  (VIAGRA ) 100 MG tablet TAKE 1 TABLET BY MOUTH EVERY DAY AS NEEDED FOR ERECTILE DYSFUNCTION 07/05/24  Yes Duanne Butler DASEN, MD  venlafaxine  XR (EFFEXOR -XR) 150 MG 24 hr capsule TAKE 1 CAPSULE BY MOUTH EVERY DAY IN THE MORNING. OFFICE VISIT NEEDED FOR ADDITIONAL REFILLS 05/05/24  Yes Duanne Butler DASEN, MD    Allergies as of 08/23/2024   (No Known Allergies)    Family History  Problem Relation Age of Onset   Diabetes Mother    Stroke Mother    Stroke Father    CAD Father    Colon cancer Neg Hx     Social History   Socioeconomic History    Marital status: Married    Spouse name: Not on file   Number of children: Not on file   Years of education: Not on file   Highest education level: Associate degree: occupational, Scientist, product/process development, or vocational program  Occupational History   Not on file  Tobacco Use   Smoking status: Every Day    Current packs/day: 1.00    Average packs/day: 1 pack/day for 35.0 years (35.0 ttl pk-yrs)    Types: Cigarettes    Passive exposure: Current (Wife is a smoker)   Smokeless tobacco: Never   Tobacco comments:    1.5 ppd since age 68.   Vaping Use   Vaping status: Never Used  Substance and Sexual Activity   Alcohol use: Yes    Alcohol/week: 48.0 standard drinks of alcohol    Types: 48 Cans of beer per week    Comment: patient states about 5 or 6 a day (beers)   Drug use: No   Sexual activity: Yes  Other Topics Concern   Not on  file  Social History Narrative   Not on file   Social Drivers of Health   Financial Resource Strain: Low Risk  (06/24/2024)   Overall Financial Resource Strain (CARDIA)    Difficulty of Paying Living Expenses: Not very hard  Food Insecurity: No Food Insecurity (06/24/2024)   Hunger Vital Sign    Worried About Running Out of Food in the Last Year: Never true    Ran Out of Food in the Last Year: Never true  Transportation Needs: Unmet Transportation Needs (06/24/2024)   PRAPARE - Administrator, Civil Service (Medical): Yes    Lack of Transportation (Non-Medical): Yes  Physical Activity: Insufficiently Active (06/24/2024)   Exercise Vital Sign    Days of Exercise per Week: 1 day    Minutes of Exercise per Session: 10 min  Stress: Stress Concern Present (06/24/2024)   [employer] of Occupational Health - Occupational Stress Questionnaire    Feeling of Stress: To some extent  Social Connections: Moderately Isolated (06/24/2024)   Social Connection and Isolation Panel    Frequency of Communication with Friends and Family: Three times a week    Frequency  of Social Gatherings with Friends and Family: Once a week    Attends Religious Services: Never    Database administrator or Organizations: No    Attends Engineer, structural: Not on file    Marital Status: Married  Catering manager Violence: Not At Risk (03/09/2024)   Humiliation, Afraid, Rape, and Kick questionnaire    Fear of Current or Ex-Partner: No    Emotionally Abused: No    Physically Abused: No    Sexually Abused: No    Review of Systems   See HPI, otherwise negative ROS  Physical Exam: BP 136/83 (BP Location: Left Arm, Patient Position: Sitting, Cuff Size: Large)   Pulse 76   Temp 98.8 F (37.1 C) (Oral)   Ht 5' 8 (1.727 m)   Wt 230 lb 6.4 oz (104.5 kg)   SpO2 93%   BMI 35.03 kg/m  General:   Alert,  Well-developed, well-nourished, pleasant and cooperative in NAD; accompanied by spouse. Neck:  Supple; no masses or thyromegaly. No significant cervical adenopathy. Lungs:  Clear throughout to auscultation.   No wheezes, crackles, or rhonchi. No acute distress. Heart:  Regular rate and rhythm; no murmurs, clicks, rubs,  or gallops. Abdomen: Non-distended, normal bowel sounds.  Soft and nontender without appreciable mass or hepatosplenomegaly.   Impression/Plan:   58 year old gentleman with longstanding heavy alcohol consumption in the way of beer following up for mild transaminitis.  Likely has met -ALD (fatty liver related to alcohol exposures). Discussed the damaging effects of alcohol in large quantities.  He understands that alcohol is a toxin to the liver.  Statin therapy likely is a minor contributing factor to the clinical picture.  If indicated, would consider resuming statin therapy  Short segment Barrett's without dysplasia history of colonic adenomas removed last year.  As discussed your alcohol consumption (in the form of beer) is likely causing fatty liver and elevated liver enzymes.  Would strongly consider discontinuing alcohol for the good of  your health long-term   Recommendations:  Hepatic function profile today.  ELF today  Continue Protonix  or pantoprazole  40 mg daily indefinitely  Plan for repeat EGD (Barrett's) and surveillance colonoscopy (polyps) in 4 years from now  Office visit here in 1 year.      Notice: This dictation was prepared with Dragon dictation along with  smaller phrase technology. Any transcriptional errors that result from this process are unintentional and may not be corrected upon review.

## 2024-08-23 NOTE — Patient Instructions (Addendum)
 Good to see you again today  As discussed your alcohol consumption (in the form of beer) is likely causing fatty liver and elevated liver enzymes.  Would strongly consider discontinuing alcohol for the good of your health long-term  Hepatic function profile today.  ELF today  Continue Protonix  or pantoprazole  40 mg daily indefinitely  Plan for repeat EGD (Barrett's) and surveillance colonoscopy (polyps) in 4 years from now  Office visit here in 1 year.

## 2024-08-25 LAB — HEPATIC FUNCTION PANEL
ALT: 84 IU/L — ABNORMAL HIGH (ref 0–44)
AST: 70 IU/L — ABNORMAL HIGH (ref 0–40)
Albumin: 4.5 g/dL (ref 3.8–4.9)
Alkaline Phosphatase: 106 IU/L (ref 47–123)
Bilirubin Total: 0.3 mg/dL (ref 0.0–1.2)
Bilirubin, Direct: 0.12 mg/dL (ref 0.00–0.40)
Total Protein: 7.2 g/dL (ref 6.0–8.5)

## 2024-08-25 LAB — ENHANCED LIVER FIBROSIS (ELF): ELF(TM) Score: 9.4 (ref <9.80)

## 2024-08-30 ENCOUNTER — Other Ambulatory Visit: Payer: Self-pay | Admitting: Family Medicine

## 2024-09-03 ENCOUNTER — Ambulatory Visit: Payer: Self-pay | Admitting: Internal Medicine

## 2024-09-03 DIAGNOSIS — R7989 Other specified abnormal findings of blood chemistry: Secondary | ICD-10-CM

## 2024-09-18 NOTE — Progress Notes (Signed)
 New Patient Pulmonology Office Visit   Subjective:  Patient ID: VUE PAVON, male    DOB: 08/27/1966  MRN: [id number]  Referred by: Duanne Butler DASEN, MD  CC: No chief complaint on file.   HPI [name] is a 58 y.o. male with hx of HTN, Barrett's esophagus, GERD, alcohol use disorder, and nicotine  dependence with current use who presents for mediastinal adenopathy.  Symptoms Associated with Lung cancer:   {Central Tumor Sx:33645}  {Peripheral Tumor Sx:33646}  {Sx of Metastasis:33647}  {Conditions associated with lung cancer & imp to identify prior to bronch:33648}  {STOPBANG:33649}  {Hx of Anesthesia reactions:33650}  PMH:   Important Medications:   Allergies:   Social History:  {Smoking and Bi[employer] Exposure:33651}  {Occupational Exposures:33652}  {Military Specific Exposures:33653}  Family History: {Cancer-related QY:66345}  ASA grade:  {ASA GRADE:110003}  Karnofsky Performance Status: {Karnofsky Performance Status:33655}  ECOG Performance Status: {findings; ecog performance status:31780}   {PULM QUESTIONNAIRES (Optional):33196}  ROS  Allergies: Patient has no known allergies.  Current Outpatient Medications:    albuterol  (VENTOLIN  HFA) 108 (90 Base) MCG/ACT inhaler, Inhale 2 puffs into the lungs every 4 (four) hours as needed., Disp: , Rfl:    amLODipine  (NORVASC ) 10 MG tablet, Take 1 tablet (10 mg total) by mouth daily., Disp: 30 tablet, Rfl: 0   carvedilol  (COREG ) 12.5 MG tablet, TAKE 1 TABLET (12.5MG  TOTAL) BY MOUTH TWICE A DAY WITH MEALS, Disp: 180 tablet, Rfl: 1   clonazePAM  (KLONOPIN ) 0.5 MG tablet, TAKE 1 TABLET (0.5 MG TOTAL) BY MOUTH 3 (THREE) TIMES DAILY AS NEEDED FOR ANXIETY., Disp: 30 tablet, Rfl: 0   cyclobenzaprine  (FLEXERIL ) 10 MG tablet, TAKE 1 TABLET BY MOUTH EVERYDAY AT BEDTIME (Patient taking differently: Take 10 mg by mouth at bedtime as needed for muscle spasms. TAKE 1 TABLET BY MOUTH EVERYDAY AT BEDTIME),  Disp: 30 tablet, Rfl: 2   fluticasone  (FLONASE ) 50 MCG/ACT nasal spray, SPRAY 2 SPRAYS INTO EACH NOSTRIL EVERY DAY (Patient taking differently: Place 2 sprays into both nostrils daily as needed. SPRAY 2 SPRAYS INTO EACH NOSTRIL EVERY DAY), Disp: 48 mL, Rfl: 3   losartan  (COZAAR ) 50 MG tablet, Take 1 tablet (50 mg total) by mouth daily., Disp: 90 tablet, Rfl: 3   Menthol  (BIOFREEZE) 10.5 % AERO, Apply 1-2 sprays topically as needed (pain)., Disp: , Rfl:    pantoprazole  (PROTONIX ) 40 MG tablet, Take 1 tablet (40 mg total) by mouth daily., Disp: 90 tablet, Rfl: 1   sildenafil  (VIAGRA ) 100 MG tablet, TAKE 1 TABLET BY MOUTH EVERY DAY AS NEEDED FOR ERECTILE DYSFUNCTION, Disp: 10 tablet, Rfl: 11   venlafaxine  XR (EFFEXOR -XR) 150 MG 24 hr capsule, TAKE 1 CAPSULE BY MOUTH EVERY DAY IN THE MORNING. OFFICE VISIT NEEDED FOR ADDITIONAL REFILLS, Disp: 90 capsule, Rfl: 0 Past Medical History:  Diagnosis Date   Allergy    Anemia    Colonic diverticular abscess 12/26/2014   Diverticulitis    ETOH abuse    GAD (generalized anxiety disorder)    GERD (gastroesophageal reflux disease)    History of fainting spells of unknown cause    Hypercholesteremia    Hypertension    Left rotator cuff tear 02/08/2019   Sleep apnea    Superior labrum anterior-to-posterior (SLAP) tear of left shoulder 02/08/2019   Tobacco abuse    Past Surgical History:  Procedure Laterality Date   APPENDECTOMY N/A 01/01/2015   Procedure: INCIDENTAL APPENDECTOMY;  Surgeon: Oneil DELENA Budge, MD;  Location: [facility];  Service: General;  Laterality: N/A;   BACK SURGERY     lower back, fusion   BIOPSY  07/16/2023   Procedure: BIOPSY;  Surgeon: San Sandor GAILS, DO;  [facility]: [facility];  Service: Gastroenterology;;   COLONOSCOPY WITH PROPOFOL  N/A 12/21/2017   Procedure: COLONOSCOPY WITH PROPOFOL ;  Surgeon: Shaaron Lamar HERO, MD;  [facility];  Service: Endoscopy;  Laterality: N/A;  9:45am   COLONOSCOPY WITH PROPOFOL  N/A 07/16/2023    Procedure: COLONOSCOPY WITH PROPOFOL ;  Surgeon: San Sandor GAILS, DO;  [facility];  Service: Gastroenterology;  Laterality: N/A;   ESOPHAGOGASTRODUODENOSCOPY (EGD) WITH PROPOFOL  N/A 07/16/2023   Procedure: ESOPHAGOGASTRODUODENOSCOPY (EGD) WITH PROPOFOL ;  Surgeon: San Sandor GAILS, DO;  [facility];  Service: Gastroenterology;  Laterality: N/A;   GASTRECTOMY N/A 04/14/2016   Procedure: ELIGIO;  Surgeon: Oneil Budge, MD;  Location: [facility];  Service: General;  Laterality: N/A;   HEMORRHOID SURGERY     HERNIA REPAIR     as child   HOT HEMOSTASIS N/A 07/16/2023   Procedure: HOT HEMOSTASIS (ARGON PLASMA COAGULATION/BICAP);  Surgeon: San Sandor GAILS, DO;  [facility];  Service: Gastroenterology;  Laterality: N/A;   LOOP RECORDER INSERTION N/A 09/06/2021   Procedure: LOOP RECORDER INSERTION;  Surgeon: Francyne Headland, MD;  [facility];  Service: Cardiovascular;  Laterality: N/A;   PARTIAL COLECTOMY N/A 01/01/2015   Procedure: PARTIAL COLECTOMY;  Surgeon: Oneil DELENA Budge, MD;  Location: [facility];  Service: General;  Laterality: N/A;   POLYPECTOMY  12/21/2017   Procedure: POLYPECTOMY;  Surgeon: Shaaron Lamar HERO, MD;  [facility];  Service: Endoscopy;;  colon   POLYPECTOMY  07/16/2023   Procedure: POLYPECTOMY;  Surgeon: San Sandor GAILS, DO;  [facility];  Service: Gastroenterology;;   ROTATOR CUFF REPAIR Bilateral    SHOULDER ARTHROSCOPY WITH ROTATOR CUFF REPAIR AND SUBACROMIAL DECOMPRESSION Left 02/09/2019   Procedure: SHOULDER ARTHROSCOPY WITH ROTATOR CUFF REPAIR AND SUBACROMIAL PARTIAL ACROMIOPLASTY, DISTAL CLAVICULECTOMY, AND EXTENSIVE DEBRIDEMENT;  Surgeon: Jane Lamar, MD;  Location: [facility];  Service: Orthopedics;  Laterality: Left;   Family History  Problem Relation Age of Onset   Diabetes Mother    Stroke Mother    Stroke Father    CAD Father    Colon cancer Neg Hx    Social History    Socioeconomic History   Marital status: Married    Spouse name: Not on file   Number of children: Not on file   Years of education: Not on file   Highest education level: Associate degree: occupational, scientist, product/process development, or vocational program  Occupational History   Not on file  Tobacco Use   Smoking status: Every Day    Current packs/day: 1.00    Average packs/day: 1 pack/day for 35.0 years (35.0 ttl pk-yrs)    Types: Cigarettes    Passive exposure: Current (Wife is a smoker)   Smokeless tobacco: Never   Tobacco comments:    1.5 ppd since age 79.   Vaping Use   Vaping status: Never Used  Substance and Sexual Activity   Alcohol use: Yes    Alcohol/week: 48.0 standard drinks of alcohol    Types: 48 Cans of beer per week    Comment: patient states about 5 or 6 a day (beers)   Drug use: No   Sexual activity: Yes  Other Topics Concern   Not on file  Social History Narrative   Not on file   Social Drivers of Health  Financial Resource Strain: Low Risk  (06/24/2024)   Overall Financial Resource Strain (CARDIA)    Difficulty of Paying Living Expenses: Not very hard  Food Insecurity: No Food Insecurity (06/24/2024)   Hunger Vital Sign    Worried About Running Out of Food in the Last Year: Never true    Ran Out of Food in the Last Year: Never true  Transportation Needs: Unmet Transportation Needs (06/24/2024)   PRAPARE - Administrator, Civil Service (Medical): Yes    Lack of Transportation (Non-Medical): Yes  Physical Activity: Insufficiently Active (06/24/2024)   Exercise Vital Sign    Days of Exercise per Week: 1 day    Minutes of Exercise per Session: 10 min  Stress: Stress Concern Present (06/24/2024)   [employer] of Occupational Health - Occupational Stress Questionnaire    Feeling of Stress: To some extent  Social Connections: Moderately Isolated (06/24/2024)   Social Connection and Isolation Panel    Frequency of Communication with Friends and Family: Three  times a week    Frequency of Social Gatherings with Friends and Family: Once a week    Attends Religious Services: Never    Database Administrator or Organizations: No    Attends Engineer, Structural: Not on file    Marital Status: Married  Catering Manager Violence: Not At Risk (03/09/2024)   Humiliation, Afraid, Rape, and Kick questionnaire    Fear of Current or Ex-Partner: No    Emotionally Abused: No    Physically Abused: No    Sexually Abused: No       Objective:  There were no vitals taken for this visit. {Pulm Vitals (Optional):32837}  Physical Exam  Diagnostic Review:  {Labs (Optional):32838}  LDCT 07/20/24: IMPRESSION: 1. Lung-RADS 1, negative. Continue annual screening with low-dose chest CT without contrast in 12 months. 2. Prevascular and AP window adenopathy, worrisome for metastatic disease. Early small cell lung cancer or a lymphoproliferative disorder not excluded. These results will be called to the ordering clinician or representative by the Radiologist Assistant, and communication documented in the PACS or [employer]. 3.  Age advanced three-vessel coronary artery calcification. 4. Liver is steatotic. 5.  Aortic atherosclerosis (ICD10-I70.0). 6.  Emphysema (ICD10-J43.9).  PET/CT 08/11/24: IMPRESSION: Hypermetabolic mediastinal prevascular and left supraclavicular lymphadenopathy concerning for neoplastic process including lymphoproliferative disease (if lymphoma Deauville score 4) versus metastasis or due to infection/inflammation. Correlate with clinical findings.   A few solid and cavitary pulmonary nodules in bilateral lung apices, below PET resolution and unlikely to be related to FDG avid lymphadenopathy. Follow-up to ensure stability.   No suspicious hypermetabolic findings within the abdomen pelvis or extremities.    Assessment & Plan:   Assessment & Plan Cigarette smoker   No orders of the defined types were placed in  this encounter.     No follow-ups on file.   Jontay Maston, MD

## 2024-09-19 ENCOUNTER — Ambulatory Visit: Admitting: Pulmonary Disease

## 2024-09-19 ENCOUNTER — Encounter: Payer: Self-pay | Admitting: Pulmonary Disease

## 2024-09-19 VITALS — BP 116/69 | HR 74 | Ht 68.0 in | Wt 228.4 lb

## 2024-09-19 DIAGNOSIS — J449 Chronic obstructive pulmonary disease, unspecified: Secondary | ICD-10-CM | POA: Diagnosis not present

## 2024-09-19 DIAGNOSIS — R590 Localized enlarged lymph nodes: Secondary | ICD-10-CM | POA: Diagnosis not present

## 2024-09-19 DIAGNOSIS — F17210 Nicotine dependence, cigarettes, uncomplicated: Secondary | ICD-10-CM

## 2024-09-19 MED ORDER — NICOTINE 21 MG/24HR TD PT24: MEDICATED_PATCH | TRANSDERMAL | 0 refills | Status: DC

## 2024-09-19 MED ORDER — NICOTINE 7 MG/24HR TD PT24: MEDICATED_PATCH | TRANSDERMAL | 0 refills | Status: DC

## 2024-09-19 MED ORDER — NICOTINE 14 MG/24HR TD PT24: MEDICATED_PATCH | TRANSDERMAL | 0 refills | Status: DC

## 2024-09-19 MED ORDER — NICOTINE POLACRILEX 4 MG MT LOZG: LOZENGE | OROMUCOSAL | 0 refills | Status: DC

## 2024-09-19 MED ORDER — ALBUTEROL SULFATE HFA 108 (90 BASE) MCG/ACT IN AERS
2.0000 | INHALATION_SPRAY | RESPIRATORY_TRACT | 6 refills | Status: AC | PRN
Start: 2024-09-19 — End: ?

## 2024-09-19 NOTE — Patient Instructions (Signed)
  VISIT SUMMARY: You came in today to discuss the results of your lung cancer screening, which showed swollen lymph glands. We also talked about your smoking history and symptoms like shortness of breath and phlegm. We discussed the next steps for further testing and treatment options.  YOUR PLAN: ENLARGED LYMPH NODES: Your PET/CT scan showed swollen lymph glands, which could be due to cancer or inflammation. -We will schedule a biopsy of the left supraclavicular lymph node with interventional radiology. -If the biopsy is inconclusive, we will refer you to thoracic surgery for further evaluation.  EMPHYSEMA AND SUSPECTED COPD: Your CT scan shows signs of emphysema, likely due to long-term smoking. You have symptoms like shortness of breath and increased phlegm, which may indicate COPD. -We will order a pulmonary function test to confirm COPD and assess its severity. -Use your albuterol  inhaler as needed for symptom relief. -We will refill your albuterol  inhaler prescription. -Monitor your inhaler usage. If you need it more than twice daily, we may consider a maintenance inhaler.  NICOTINE  DEPENDENCE: You have a long history of smoking, currently about one pack per day. Quitting smoking is crucial for your health. -We will prescribe nicotine  patches and provide instructions on how to use them. -Consider enrolling in a smoking cessation program via 1-800-QUIT-NOW for additional support and free lozenges or gum.                      Contains text generated by [employer].                                 Contains text generated by [employer].

## 2024-09-20 ENCOUNTER — Encounter (HOSPITAL_COMMUNITY): Payer: Self-pay

## 2024-09-20 NOTE — Progress Notes (Signed)
 Luverne Aran, MD  Michaelene Setter PROCEDURE / BIOPSY REVIEW Date: 09/20/24  Requested Biopsy site: Left supraclavicular LN Reason for request: Lymphadenopathy Imaging review: Best seen on PET  Decision: Approved Imaging modality to perform: Ultrasound Schedule with: Moderate Sedation Schedule for: Any VIR  Additional comments: @Schedulers . May be trickier than typical LN biopsy, so I would offer patient sedation, if he wants it.  Please contact me with questions, concerns, or if issue pertaining to this request arise.  Aran ONEIDA Luverne, MD [facility]       Previous Messages    ----- Message ----- From: Yakima Kreitzer Sent: 09/20/2024  10:33 AM EDT To: Gaege Sangalang; Ir Procedure Requests Subject: IR LYMPH NODE CORE BIOPSY                      Procedure : IR LYMPH NODE CORE BIOPSY   Reason: Left supraclavicular adenopathy - PET AVID - please send for flow cytometry Dx: Supraclavicular adenopathy [R59.0 (ICD-10-CM)]    History : NM PET whole body , CT Chest lung cancer screening  Provider : Catherine Cools, MD  Contact : [phone]

## 2024-09-21 ENCOUNTER — Telehealth: Payer: Self-pay | Admitting: Pulmonary Disease

## 2024-09-21 NOTE — Telephone Encounter (Signed)
 LVM for patient informing him the scheduler for the Lumph Node Core Biopsy ordered by Dr. Catherine is out of the office and will return on Thursday 09/22/24.  Requested return call with questions or concerns

## 2024-09-26 ENCOUNTER — Encounter: Payer: Self-pay | Admitting: Radiology

## 2024-09-27 NOTE — Telephone Encounter (Signed)
 I called and spoke with patient, he has gotten the biopsy scheduled prior to Thanksgiving.

## 2024-09-27 NOTE — Telephone Encounter (Signed)
 Good morning, Patient is asking about the biopsy with IR.  Please advise.  Thank you.

## 2024-09-27 NOTE — Telephone Encounter (Signed)
 Can you reach out to interventional radiology department to see if they would schedule his biopsy please?

## 2024-09-28 ENCOUNTER — Other Ambulatory Visit: Payer: Self-pay | Admitting: Family Medicine

## 2024-10-03 ENCOUNTER — Telehealth: Payer: Self-pay

## 2024-10-03 ENCOUNTER — Other Ambulatory Visit: Payer: Self-pay

## 2024-10-03 DIAGNOSIS — K271 Acute peptic ulcer, site unspecified, with perforation: Secondary | ICD-10-CM

## 2024-10-03 MED ORDER — PANTOPRAZOLE SODIUM 40 MG PO TBEC: 40.0000 mg | DELAYED_RELEASE_TABLET | Freq: Every day | ORAL | 1 refills | Status: AC

## 2024-10-03 NOTE — Telephone Encounter (Signed)
 Prescription Request  10/03/2024  LOV: 06/28/24  What is the name of the medication or equipment? pantoprazole  (PROTONIX ) 40 MG tablet [[id number]]   Have you contacted your pharmacy to request a refill? Yes   Which pharmacy would you like this sent to?  [id number] - [place], [place] - [address] AT [facility] [address] [place] [address] Phone: [phone] Fax: [phone]    Patient notified that their request is being sent to the clinical staff for review and that they should receive a response within 2 business days.   Please advise at [facility] [phone]  Prescription Request  10/03/2024  LOV: 06/28/24  What is the name of the medication or equipment? venlafaxine  XR (EFFEXOR -XR) 150 MG 24 hr capsule [[id number]]   Have you contacted your pharmacy to request a refill? Yes   Which pharmacy would you like this sent to?  [id number] - [place], [place] - [address] AT [facility] [address] [place] [address] Phone: [phone] Fax: [phone]    Patient notified that their request is being sent to the clinical staff for review and that they should receive a response within 2 business days.   Please advise at [facility] [phone]

## 2024-10-06 ENCOUNTER — Encounter: Payer: Self-pay | Admitting: Family Medicine

## 2024-10-06 ENCOUNTER — Other Ambulatory Visit: Payer: Self-pay

## 2024-10-06 DIAGNOSIS — F411 Generalized anxiety disorder: Secondary | ICD-10-CM

## 2024-10-06 MED ORDER — VENLAFAXINE HCL ER 150 MG PO CP24: ORAL_CAPSULE | ORAL | 1 refills | Status: AC

## 2024-10-07 ENCOUNTER — Telehealth: Payer: Self-pay

## 2024-10-07 ENCOUNTER — Other Ambulatory Visit: Payer: Self-pay

## 2024-10-07 DIAGNOSIS — I10 Essential (primary) hypertension: Secondary | ICD-10-CM

## 2024-10-07 MED ORDER — AMLODIPINE BESYLATE 10 MG PO TABS: 10.0000 mg | ORAL_TABLET | Freq: Every day | ORAL | 0 refills | Status: DC

## 2024-10-07 NOTE — Telephone Encounter (Signed)
 Prescription Request  10/07/2024  LOV: 06/28/24  What is the name of the medication or equipment? amLODipine  (NORVASC ) 10 MG tablet [[id number]]   Have you contacted your pharmacy to request a refill? Yes   Which pharmacy would you like this sent to?  [id number] - [place], [place] - [address] AT [facility] [address] [place] [address] Phone: [phone] Fax: [phone]    Patient notified that their request is being sent to the clinical staff for review and that they should receive a response within 2 business days.   Please advise at [facility] [phone]

## 2024-10-07 NOTE — Telephone Encounter (Signed)
 Sent in medication

## 2024-10-17 ENCOUNTER — Other Ambulatory Visit (HOSPITAL_COMMUNITY): Payer: Self-pay | Admitting: Radiology

## 2024-10-17 DIAGNOSIS — R590 Localized enlarged lymph nodes: Secondary | ICD-10-CM

## 2024-10-19 ENCOUNTER — Ambulatory Visit (HOSPITAL_COMMUNITY)
Admission: RE | Admit: 2024-10-19 | Discharge: 2024-10-19 | Disposition: A | Discharge status: Home / Self Care | Source: Ambulatory Visit | Attending: Pulmonary Disease | Admitting: Pulmonary Disease

## 2024-10-19 ENCOUNTER — Encounter (HOSPITAL_COMMUNITY): Payer: Self-pay

## 2024-10-19 ENCOUNTER — Other Ambulatory Visit: Payer: Self-pay

## 2024-10-19 DIAGNOSIS — R590 Localized enlarged lymph nodes: Secondary | ICD-10-CM

## 2024-10-19 MED ORDER — SODIUM CHLORIDE 0.9 % IV SOLN: INTRAVENOUS | Status: DC

## 2024-10-19 NOTE — H&P (Addendum)
 Chief Complaint: Left supraclavicular lymphadenopathy - IR consulted for image guided biopsy  Referring Provider(s): Alghanim, Paula, MD  Supervising Physician: Philip Cornet  Patient Status: [facility] - Out-pt  History of Present Illness: [name] is a 58 y.o. male with pmhx of anemia, depression, EtOH abuse, smoking/tobacco abuse, GAD, GERD, HTN, HLD. Pt underwent routine lung cancer screening CT chest on 07/20/24 with some mild mediastinal lymphadenopathy seen. Follow up PET scan on 08/11/24 shows hypermetabolic mediastinal and left supraclavicular lymph nodes, concerning for neoplastic process. IR now consulted for image guided biopsy of left supraclavicular lymph node.  Today patient without complaint. Has been NPO since midnight. All questions answered.   Patient is Full Code  Past Medical History:  Diagnosis Date   Allergy    Anemia    Arthritis    Colonic diverticular abscess 12/26/2014   Depression    Diverticulitis    ETOH abuse    GAD (generalized anxiety disorder)    GERD (gastroesophageal reflux disease)    History of fainting spells of unknown cause    Hypercholesteremia    Hypertension    Left rotator cuff tear 02/08/2019   Seizures (HCC)    Sleep apnea    Substance abuse (HCC)    Superior labrum anterior-to-posterior (SLAP) tear of left shoulder 02/08/2019   Tobacco abuse     Past Surgical History:  Procedure Laterality Date   APPENDECTOMY N/A 01/01/2015   Procedure: INCIDENTAL APPENDECTOMY;  Surgeon: Oneil DELENA Budge, MD;  Location: [facility];  Service: General;  Laterality: N/A;   BACK SURGERY     lower back, fusion   BIOPSY  07/16/2023   Procedure: BIOPSY;  Surgeon: San Sandor GAILS, DO;  Location: [facility];  Service: Gastroenterology;;   COLON SURGERY     COLONOSCOPY WITH PROPOFOL  N/A 12/21/2017   Procedure: COLONOSCOPY WITH PROPOFOL ;  Surgeon: Shaaron Lamar HERO, MD;  Location: [facility];  Service: Endoscopy;  Laterality: N/A;  9:45am    COLONOSCOPY WITH PROPOFOL  N/A 07/16/2023   Procedure: COLONOSCOPY WITH PROPOFOL ;  Surgeon: San Sandor GAILS, DO;  Location: [facility];  Service: Gastroenterology;  Laterality: N/A;   ESOPHAGOGASTRODUODENOSCOPY (EGD) WITH PROPOFOL  N/A 07/16/2023   Procedure: ESOPHAGOGASTRODUODENOSCOPY (EGD) WITH PROPOFOL ;  Surgeon: San Sandor GAILS, DO;  Location: [facility];  Service: Gastroenterology;  Laterality: N/A;   GASTRECTOMY N/A 04/14/2016   Procedure: ELIGIO;  Surgeon: Oneil Budge, MD;  Location: [facility];  Service: General;  Laterality: N/A;   HEMORRHOID SURGERY     HERNIA REPAIR     as child   HOT HEMOSTASIS N/A 07/16/2023   Procedure: HOT HEMOSTASIS (ARGON PLASMA COAGULATION/BICAP);  Surgeon: San Sandor GAILS, DO;  Location: [facility];  Service: Gastroenterology;  Laterality: N/A;   LOOP RECORDER INSERTION N/A 09/06/2021   Procedure: LOOP RECORDER INSERTION;  Surgeon: Francyne Headland, MD;  Location: [facility];  Service: Cardiovascular;  Laterality: N/A;   PARTIAL COLECTOMY N/A 01/01/2015   Procedure: PARTIAL COLECTOMY;  Surgeon: Oneil DELENA Budge, MD;  Location: [facility];  Service: General;  Laterality: N/A;   POLYPECTOMY  12/21/2017   Procedure: POLYPECTOMY;  Surgeon: Shaaron Lamar HERO, MD;  [facility];  Service: Endoscopy;;  colon   POLYPECTOMY  07/16/2023   Procedure: POLYPECTOMY;  Surgeon: San Sandor GAILS, DO;  Location: [facility];  Service: Gastroenterology;;   ROTATOR CUFF REPAIR Bilateral    SHOULDER ARTHROSCOPY WITH ROTATOR CUFF REPAIR AND SUBACROMIAL DECOMPRESSION Left 02/09/2019   Procedure: SHOULDER ARTHROSCOPY WITH ROTATOR CUFF  REPAIR AND SUBACROMIAL PARTIAL ACROMIOPLASTY, DISTAL CLAVICULECTOMY, AND EXTENSIVE DEBRIDEMENT;  Surgeon: Jane Charleston, MD;  Location: [facility];  Service: Orthopedics;  Laterality: Left;   SPINE SURGERY      Allergies: Patient has no known allergies.  Medications: Prior to Admission medications    Medication Sig Start Date End Date Taking? Authorizing Provider  albuterol  (VENTOLIN  HFA) 108 (90 Base) MCG/ACT inhaler Inhale 2 puffs into the lungs every 4 (four) hours as needed. 09/19/24   Alghanim, Paula, MD  amLODipine  (NORVASC ) 10 MG tablet Take 1 tablet (10 mg total) by mouth daily. 10/07/24   Duanne Butler DASEN, MD  carvedilol  (COREG ) 12.5 MG tablet TAKE 1 TABLET (12.5MG  TOTAL) BY MOUTH TWICE A DAY WITH MEALS 05/03/24   Duanne Butler DASEN, MD  clonazePAM  (KLONOPIN ) 0.5 MG tablet TAKE 1 TABLET (0.5 MG TOTAL) BY MOUTH 3 (THREE) TIMES DAILY AS NEEDED FOR ANXIETY. 09/30/24   Duanne Butler DASEN, MD  cyclobenzaprine  (FLEXERIL ) 10 MG tablet TAKE 1 TABLET BY MOUTH EVERYDAY AT BEDTIME Patient taking differently: Take 10 mg by mouth at bedtime as needed for muscle spasms. TAKE 1 TABLET BY MOUTH EVERYDAY AT BEDTIME 10/02/20   Duanne Butler DASEN, MD  fluticasone  (FLONASE ) 50 MCG/ACT nasal spray SPRAY 2 SPRAYS INTO EACH NOSTRIL EVERY DAY Patient taking differently: Place 2 sprays into both nostrils daily as needed. SPRAY 2 SPRAYS INTO EACH NOSTRIL EVERY DAY 12/21/23   Duanne Butler DASEN, MD  losartan  (COZAAR ) 50 MG tablet Take 1 tablet (50 mg total) by mouth daily. 02/01/24   Duanne Butler DASEN, MD  Menthol  (BIOFREEZE) 10.5 % AERO Apply 1-2 sprays topically as needed (pain).    [provider]  nicotine  (NICODERM CQ  - DOSED IN MG/24 HOURS) 14 mg/24hr patch RX #2 Weeks 5-6: 14 mg x 1 patch daily. Wear for 24 hours. If you have sleep disturbances, remove at bedtime. 09/19/24   Alghanim, Paula, MD  nicotine  (NICODERM CQ  - DOSED IN MG/24 HOURS) 21 mg/24hr patch RX #1 Weeks 1-4: 21 mg x 1 patch daily. Wear for 24 hours. If you have sleep disturbances, remove at bedtime. 09/19/24   Alghanim, Paula, MD  nicotine  (NICODERM CQ  - DOSED IN MG/24 HR) 7 mg/24hr patch RX #3 Weeks 7-8: 7 mg x 1 patch daily. Wear for 24 hours. If you have sleep disturbances, remove at bedtime. 09/19/24   Alghanim, Paula, MD  nicotine   polacrilex (COMMIT) 4 MG lozenge RX #1 Weeks 1-6: 1 lozenge every 1-2 hours. Use at least 9 lozenges per day for the first 6 weeks. Max 20 lozenges per day. 09/19/24   Alghanim, Paula, MD  pantoprazole  (PROTONIX ) 40 MG tablet Take 1 tablet (40 mg total) by mouth daily. 10/03/24   Duanne Butler DASEN, MD  sildenafil  (VIAGRA ) 100 MG tablet TAKE 1 TABLET BY MOUTH EVERY DAY AS NEEDED FOR ERECTILE DYSFUNCTION 07/05/24   Duanne Butler DASEN, MD  venlafaxine  XR (EFFEXOR -XR) 150 MG 24 hr capsule TAKE 1 CAPSULE BY MOUTH EVERY DAY IN THE MORNING. 10/06/24   Duanne Butler DASEN, MD     Family History  Problem Relation Age of Onset   Diabetes Mother    Stroke Mother    Heart disease Mother    Stroke Father    CAD Father    Heart disease Father    ADD / ADHD Son    Alcohol abuse Son    Alcohol abuse Maternal Uncle    Anxiety disorder Maternal Aunt    Depression Maternal Aunt  Alcohol abuse Maternal Uncle    Colon cancer Neg Hx     Social History   Socioeconomic History   Marital status: Married    Spouse name: Not on file   Number of children: Not on file   Years of education: Not on file   Highest education level: Associate degree: occupational, scientist, product/process development, or vocational program  Occupational History   Not on file  Tobacco Use   Smoking status: Every Day    Current packs/day: 1.00    Average packs/day: 1 pack/day for 35.0 years (35.0 ttl pk-yrs)    Types: Cigarettes    Passive exposure: Current (Wife is a smoker)   Smokeless tobacco: Never   Tobacco comments:    1.5 ppd since age 32.   Vaping Use   Vaping status: Never Used  Substance and Sexual Activity   Alcohol use: Yes    Alcohol/week: 48.0 standard drinks of alcohol    Types: 48 Cans of beer per week    Comment: patient states about 5 or 6 a day (beers)   Drug use: No   Sexual activity: Yes  Other Topics Concern   Not on file  Social History Narrative   Not on file   Social Drivers of Health   Financial Resource Strain:  Low Risk  (06/24/2024)   Overall Financial Resource Strain (CARDIA)    Difficulty of Paying Living Expenses: Not very hard  Food Insecurity: No Food Insecurity (06/24/2024)   Hunger Vital Sign    Worried About Running Out of Food in the Last Year: Never true    Ran Out of Food in the Last Year: Never true  Transportation Needs: Unmet Transportation Needs (06/24/2024)   PRAPARE - Transportation    Lack of Transportation (Medical): Yes    Lack of Transportation (Non-Medical): Yes  Physical Activity: Insufficiently Active (06/24/2024)   Exercise Vital Sign    Days of Exercise per Week: 1 day    Minutes of Exercise per Session: 10 min  Stress: Stress Concern Present (06/24/2024)   [employer] of Occupational Health - Occupational Stress Questionnaire    Feeling of Stress: To some extent  Social Connections: Moderately Isolated (06/24/2024)   Social Connection and Isolation Panel    Frequency of Communication with Friends and Family: Three times a week    Frequency of Social Gatherings with Friends and Family: Once a week    Attends Religious Services: Never    Database Administrator or Organizations: No    Attends Engineer, Structural: Not on file    Marital Status: Married     Review of Systems: A 12 point ROS discussed and pertinent positives are indicated in the HPI above.  All other systems are negative.  Vital Signs: BP (!) 153/89   Pulse 85   Temp (!) 97.5 F (36.4 C) (Oral)   Resp 18   Ht 5' 8 (1.727 m)   Wt 220 lb (99.8 kg)   SpO2 94%   BMI 33.45 kg/m   Advance Care Plan: No documents on file  Physical Exam Vitals and nursing note reviewed.  Constitutional:      General: He is not in acute distress. HENT:     Mouth/Throat:     Mouth: Mucous membranes are moist.     Pharynx: Oropharynx is clear.  Cardiovascular:     Rate and Rhythm: Normal rate and regular rhythm.  Pulmonary:     Effort: Pulmonary effort is normal.  Breath sounds: Normal breath  sounds.  Abdominal:     Palpations: Abdomen is soft.     Tenderness: There is no abdominal tenderness.  Musculoskeletal:     Right lower leg: No edema.     Left lower leg: No edema.  Skin:    General: Skin is warm and dry.  Neurological:     Mental Status: He is alert and oriented to person, place, and time. Mental status is at baseline.     Imaging: No results found.  Labs:  CBC: Recent Labs    01/21/24 1029 06/28/24 1128  WBC 10.3 11.8*  HGB 17.2* 16.0  HCT 50.6 47.1  PLT 214 284    COAGS: Recent Labs    01/21/24 1029  INR 1.0    BMP: Recent Labs    01/21/24 1029 06/28/24 1128  NA 136 134*  K 4.2 4.3  CL 99 97*  CO2 27 27  GLUCOSE 119* 140*  BUN 12 12  CALCIUM  9.7 9.3  CREATININE 0.80 0.71  GFRNONAA >60  --     LIVER FUNCTION TESTS: Recent Labs    01/21/24 1029 06/28/24 1128 08/23/24 1216  BILITOT 0.7 0.3 0.3  AST 77* 46* 70*  ALT 89* 51* 84*  ALKPHOS 98  --  106  PROT 7.5 6.8 7.2  ALBUMIN 4.0  --  4.5    TUMOR MARKERS: No results for input(s): AFPTM, CEA, CA199, CHROMGRNA in the last 8760 hours.  Assessment and Plan:  [name] is a 58 y.o. male with pmhx of anemia, depression, EtOH abuse, GAD, GERD, HTN, HLD. PT underwent routine lung cancer screening CT chest on 07/20/24 with some mild mediastinal lymphadenopathy seen. Follow up PET scan on 08/11/24 shows hypermetabolic mediastinal and left supraclavicular lymph nodes, concerning for neoplastic process. IR now consulted for image guided biopsy of left supraclavicular lymph node.  Today patient without complaint. Has been NPO since midnight. All questions answered.  Risks and benefits of left supraclavicular lymph node biopsy was discussed with the patient and/or patient's family including, but not limited to bleeding, infection, damage to adjacent structures or low yield requiring additional tests.  All of the questions were answered and there is agreement to  proceed.  Consent signed and in chart.   Thank you for allowing our service to participate in [name] 's care.  Electronically Signed: Kimble VEAR Clas, PA-C   10/19/2024, 11:35 AM      I spent a total of  15 Minutes   in face to face in clinical consultation, greater than 50% of which was counseling/coordinating care for left supraclavicular lymph node biopsy

## 2024-10-26 ENCOUNTER — Other Ambulatory Visit: Payer: Self-pay | Admitting: Family Medicine

## 2024-10-27 ENCOUNTER — Encounter: Admitting: Emergency Medicine

## 2024-10-29 NOTE — Telephone Encounter (Signed)
 Requested medication (s) are due for refill today: yes  Requested medication (s) are on the active medication list: yes  Last refill:  09/30/24  Future visit scheduled: no  Notes to clinic:  Unable to refill per protocol, cannot delegate.      Requested Prescriptions  Pending Prescriptions Disp Refills   clonazePAM  (KLONOPIN ) 0.5 MG tablet [Pharmacy Med Name: CLONAZEPAM  0.5 MG TABLET] 30 tablet 0    Sig: TAKE 1 TABLET (0.5 MG TOTAL) BY MOUTH 3 (THREE) TIMES DAILY AS NEEDED FOR ANXIETY.     Not Delegated - Psychiatry: Anxiolytics/Hypnotics 2 Failed - 10/29/2024  9:38 AM      Failed - This refill cannot be delegated      Failed - Urine Drug Screen completed in last 360 days      Passed - Patient is not pregnant      Passed - Valid encounter within last 6 months    Recent Outpatient Visits           4 months ago Barrett's esophagus without dysplasia   [facility] Pickard, Butler DASEN, MD   1 year ago Pure hypercholesterolemia   [facility] Pickard, Butler DASEN, MD   1 year ago GAD (generalized anxiety disorder)    [facility] Pickard, Butler DASEN, MD

## 2024-10-31 ENCOUNTER — Other Ambulatory Visit: Payer: Self-pay

## 2024-11-01 ENCOUNTER — Other Ambulatory Visit: Payer: Self-pay | Admitting: Family Medicine

## 2024-11-01 ENCOUNTER — Other Ambulatory Visit: Payer: Self-pay

## 2024-11-01 DIAGNOSIS — I10 Essential (primary) hypertension: Secondary | ICD-10-CM

## 2024-11-01 MED ORDER — CLONAZEPAM 0.5 MG PO TABS: 0.5000 mg | ORAL_TABLET | Freq: Three times a day (TID) | ORAL | 0 refills | Status: AC | PRN

## 2024-11-03 ENCOUNTER — Other Ambulatory Visit: Payer: Self-pay | Admitting: Physician Assistant

## 2024-11-03 DIAGNOSIS — Z01818 Encounter for other preprocedural examination: Secondary | ICD-10-CM

## 2024-11-04 ENCOUNTER — Inpatient Hospital Stay (HOSPITAL_COMMUNITY): Admission: RE | Admit: 2024-11-04 | Discharge: 2024-11-04 | Attending: Pulmonary Disease | Admitting: Pulmonary Disease

## 2024-11-04 ENCOUNTER — Ambulatory Visit (HOSPITAL_COMMUNITY)
Admission: RE | Admit: 2024-11-04 | Discharge: 2024-11-04 | Disposition: A | Discharge status: Home / Self Care | Source: Ambulatory Visit | Attending: Pulmonary Disease | Admitting: Pulmonary Disease

## 2024-11-04 ENCOUNTER — Encounter (HOSPITAL_COMMUNITY): Payer: Self-pay

## 2024-11-04 DIAGNOSIS — I10 Essential (primary) hypertension: Secondary | ICD-10-CM | POA: Insufficient documentation

## 2024-11-04 DIAGNOSIS — C770 Secondary and unspecified malignant neoplasm of lymph nodes of head, face and neck: Secondary | ICD-10-CM | POA: Insufficient documentation

## 2024-11-04 DIAGNOSIS — K219 Gastro-esophageal reflux disease without esophagitis: Secondary | ICD-10-CM | POA: Diagnosis not present

## 2024-11-04 DIAGNOSIS — F411 Generalized anxiety disorder: Secondary | ICD-10-CM | POA: Diagnosis not present

## 2024-11-04 DIAGNOSIS — F17210 Nicotine dependence, cigarettes, uncomplicated: Secondary | ICD-10-CM | POA: Diagnosis not present

## 2024-11-04 DIAGNOSIS — R590 Localized enlarged lymph nodes: Secondary | ICD-10-CM

## 2024-11-04 DIAGNOSIS — Z01818 Encounter for other preprocedural examination: Secondary | ICD-10-CM

## 2024-11-04 HISTORY — DX: Dyspnea, unspecified: R06.00

## 2024-11-04 LAB — CBC
HCT: 50.2 % (ref 39.0–52.0)
Hemoglobin: 17 g/dL (ref 13.0–17.0)
MCH: 33.5 pg (ref 26.0–34.0)
MCHC: 33.9 g/dL (ref 30.0–36.0)
MCV: 99 fL (ref 80.0–100.0)
Platelets: 236 K/uL (ref 150–400)
RBC: 5.07 MIL/uL (ref 4.22–5.81)
RDW: 12.7 % (ref 11.5–15.5)
WBC: 11.1 K/uL — ABNORMAL HIGH (ref 4.0–10.5)
nRBC: 0 % (ref 0.0–0.2)

## 2024-11-04 MED ORDER — LIDOCAINE HCL 1 % IJ SOLN
INTRAMUSCULAR | Status: AC
  Filled 2024-11-04: qty 20

## 2024-11-04 MED ORDER — SODIUM CHLORIDE 0.9 % IV SOLN: INTRAVENOUS | Status: DC

## 2024-11-04 MED ORDER — MIDAZOLAM HCL 2 MG/2ML IJ SOLN
INTRAMUSCULAR | Status: AC
  Filled 2024-11-04: qty 2

## 2024-11-04 MED ORDER — LIDOCAINE HCL 1 % IJ SOLN
20.0000 mL | Freq: Once | INTRAMUSCULAR | Status: AC
  Administered 2024-11-04: 10 mL via INTRADERMAL

## 2024-11-04 MED ORDER — FENTANYL CITRATE (PF) 100 MCG/2ML IJ SOLN
INTRAMUSCULAR | Status: AC
  Filled 2024-11-04: qty 2

## 2024-11-04 MED ADMIN — Midazolam HCl Inj PF 2 MG/2ML (Base Equivalent): 1 mg | INTRAVENOUS | NDC 00409000125

## 2024-11-04 MED ADMIN — Fentanyl Citrate Preservative Free (PF) Inj 100 MCG/2ML: 50 ug | INTRAVENOUS | NDC 72572017001

## 2024-11-04 NOTE — H&P (Signed)
 Chief Complaint: left supraclavicular adenopathy  Referring Provider(s): Alghanim,Fahid  Supervising Physician: Philip Cornet  Patient Status: [facility] - Out-pt  History of Present Illness: [name] is a 58 y.o. male with medical history significant for anemia, diverticulitis, ETOH abuse, GAD, GERD, HTN, and smoking/tobacco abuse. Patient underwent a routine lung cancer screening with CT chest on 07/20/2024 with prevascular and AP window adenopathy seen. Follow up PET scan on 08/11/2024 revealed hypermetabolic mediastinal and left supraclavicular lymphadenopathy concerning for neoplastic process. Patient presents today for image guided left supraclavicular lymph node biopsy.  Confirms NPO since midnight and ride/supervision available for 24 hours. He does not wear CPAP or use supplemental home O2.  Denies fever, chills, shortness of breath, chest pain, sore throat, nausea/vomiting/diarrhea, abdominal pain, blood in stool or urine, abnormal bruising, leg swelling, back pain.  Reports headache.  Allergies Reviewed:  Patient has no known allergies.   Patient is Full Code  Past Medical History:  Diagnosis Date   Allergy    Anemia    Arthritis    Colonic diverticular abscess 12/26/2014   Depression    Diverticulitis    Dyspnea    ETOH abuse    GAD (generalized anxiety disorder)    GERD (gastroesophageal reflux disease)    History of fainting spells of unknown cause    Hypercholesteremia    Hypertension    Left rotator cuff tear 02/08/2019   Seizures (HCC)    Sleep apnea    Substance abuse (HCC)    Superior labrum anterior-to-posterior (SLAP) tear of left shoulder 02/08/2019   Tobacco abuse     Past Surgical History:  Procedure Laterality Date   APPENDECTOMY N/A 01/01/2015   Procedure: INCIDENTAL APPENDECTOMY;  Surgeon: Oneil DELENA Budge, MD;  Location: [facility];  Service: General;  Laterality: N/A;   BACK SURGERY     lower back, fusion   BIOPSY  07/16/2023   Procedure:  BIOPSY;  Surgeon: San Sandor GAILS, DO;  Location: [facility];  Service: Gastroenterology;;   COLON SURGERY     COLONOSCOPY WITH PROPOFOL  N/A 12/21/2017   Procedure: COLONOSCOPY WITH PROPOFOL ;  Surgeon: Shaaron Lamar HERO, MD;  Location: [facility];  Service: Endoscopy;  Laterality: N/A;  9:45am   COLONOSCOPY WITH PROPOFOL  N/A 07/16/2023   Procedure: COLONOSCOPY WITH PROPOFOL ;  Surgeon: San Sandor GAILS, DO;  Location: [facility];  Service: Gastroenterology;  Laterality: N/A;   ESOPHAGOGASTRODUODENOSCOPY (EGD) WITH PROPOFOL  N/A 07/16/2023   Procedure: ESOPHAGOGASTRODUODENOSCOPY (EGD) WITH PROPOFOL ;  Surgeon: San Sandor GAILS, DO;  Location: [facility];  Service: Gastroenterology;  Laterality: N/A;   GASTRECTOMY N/A 04/14/2016   Procedure: ELIGIO;  Surgeon: Oneil Budge, MD;  Location: [facility];  Service: General;  Laterality: N/A;   HEMORRHOID SURGERY     HERNIA REPAIR     as child   HOT HEMOSTASIS N/A 07/16/2023   Procedure: HOT HEMOSTASIS (ARGON PLASMA COAGULATION/BICAP);  Surgeon: San Sandor GAILS, DO;  Location: [facility] ENDOSCOPY;  Service: Gastroenterology;  Laterality: N/A;   LOOP RECORDER INSERTION N/A 09/06/2021   Procedure: LOOP RECORDER INSERTION;  Surgeon: Francyne Headland, MD;  Location: [facility];  Service: Cardiovascular;  Laterality: N/A;   PARTIAL COLECTOMY N/A 01/01/2015   Procedure: PARTIAL COLECTOMY;  Surgeon: Oneil DELENA Budge, MD;  Location: [facility];  Service: General;  Laterality: N/A;   POLYPECTOMY  12/21/2017   Procedure: POLYPECTOMY;  Surgeon: Shaaron Lamar HERO, MD;  [facility];  Service: Endoscopy;;  colon   POLYPECTOMY  07/16/2023  Procedure: POLYPECTOMY;  Surgeon: San Sandor GAILS, DO;  Location: [facility];  Service: Gastroenterology;;   ROTATOR CUFF REPAIR Bilateral    SHOULDER ARTHROSCOPY WITH ROTATOR CUFF REPAIR AND SUBACROMIAL DECOMPRESSION Left 02/09/2019   Procedure: SHOULDER ARTHROSCOPY WITH ROTATOR CUFF REPAIR AND  SUBACROMIAL PARTIAL ACROMIOPLASTY, DISTAL CLAVICULECTOMY, AND EXTENSIVE DEBRIDEMENT;  Surgeon: Jane Charleston, MD;  Location: [facility];  Service: Orthopedics;  Laterality: Left;   SPINE SURGERY        Medications: Prior to Admission medications  Medication Sig Start Date End Date Taking? Authorizing Provider  albuterol  (VENTOLIN  HFA) 108 (90 Base) MCG/ACT inhaler Inhale 2 puffs into the lungs every 4 (four) hours as needed. 09/19/24  Yes Alghanim, Fahid, MD  amLODipine  (NORVASC ) 10 MG tablet TAKE 1 TABLET BY MOUTH EVERY DAY 11/01/24  Yes Duanne Butler DASEN, MD  carvedilol  (COREG ) 12.5 MG tablet TAKE 1 TABLET (12.5MG  TOTAL) BY MOUTH TWICE A DAY WITH MEALS 05/03/24  Yes Duanne Butler DASEN, MD  clonazePAM  (KLONOPIN ) 0.5 MG tablet Take 1 tablet (0.5 mg total) by mouth 3 (three) times daily as needed for anxiety. 11/01/24  Yes Duanne Butler DASEN, MD  losartan  (COZAAR ) 50 MG tablet Take 1 tablet (50 mg total) by mouth daily. 02/01/24  Yes Duanne Butler DASEN, MD  pantoprazole  (PROTONIX ) 40 MG tablet Take 1 tablet (40 mg total) by mouth daily. 10/03/24  Yes Duanne Butler DASEN, MD  sildenafil  (VIAGRA ) 100 MG tablet TAKE 1 TABLET BY MOUTH EVERY DAY AS NEEDED FOR ERECTILE DYSFUNCTION 07/05/24  Yes Duanne Butler DASEN, MD  venlafaxine  XR (EFFEXOR -XR) 150 MG 24 hr capsule TAKE 1 CAPSULE BY MOUTH EVERY DAY IN THE MORNING. 10/06/24  Yes Duanne Butler DASEN, MD  cyclobenzaprine  (FLEXERIL ) 10 MG tablet TAKE 1 TABLET BY MOUTH EVERYDAY AT BEDTIME Patient taking differently: Take 10 mg by mouth at bedtime as needed for muscle spasms. TAKE 1 TABLET BY MOUTH EVERYDAY AT BEDTIME 10/02/20   Duanne Butler DASEN, MD  fluticasone  (FLONASE ) 50 MCG/ACT nasal spray SPRAY 2 SPRAYS INTO EACH NOSTRIL EVERY DAY Patient taking differently: Place 2 sprays into both nostrils daily as needed. SPRAY 2 SPRAYS INTO EACH NOSTRIL EVERY DAY 12/21/23   Duanne Butler DASEN, MD  Menthol  (BIOFREEZE) 10.5 % AERO Apply 1-2 sprays topically as  needed (pain).    [provider]  nicotine  (NICODERM CQ  - DOSED IN MG/24 HOURS) 14 mg/24hr patch RX #2 Weeks 5-6: 14 mg x 1 patch daily. Wear for 24 hours. If you have sleep disturbances, remove at bedtime. 09/19/24   Alghanim, Paula, MD  nicotine  (NICODERM CQ  - DOSED IN MG/24 HOURS) 21 mg/24hr patch RX #1 Weeks 1-4: 21 mg x 1 patch daily. Wear for 24 hours. If you have sleep disturbances, remove at bedtime. 09/19/24   Alghanim, Paula, MD  nicotine  (NICODERM CQ  - DOSED IN MG/24 HR) 7 mg/24hr patch RX #3 Weeks 7-8: 7 mg x 1 patch daily. Wear for 24 hours. If you have sleep disturbances, remove at bedtime. 09/19/24   Alghanim, Paula, MD  nicotine  polacrilex (COMMIT) 4 MG lozenge RX #1 Weeks 1-6: 1 lozenge every 1-2 hours. Use at least 9 lozenges per day for the first 6 weeks. Max 20 lozenges per day. 09/19/24   Catherine Paula, MD     Family History  Problem Relation Age of Onset   Diabetes Mother    Stroke Mother    Heart disease Mother    Stroke Father    CAD Father    Heart disease  Father    ADD / ADHD Son    Alcohol abuse Son    Alcohol abuse Maternal Uncle    Anxiety disorder Maternal Aunt    Depression Maternal Aunt    Alcohol abuse Maternal Uncle    Colon cancer Neg Hx     Social History   Socioeconomic History   Marital status: Married    Spouse name: Not on file   Number of children: Not on file   Years of education: Not on file   Highest education level: Associate degree: occupational, scientist, product/process development, or vocational program  Occupational History   Not on file  Tobacco Use   Smoking status: Every Day    Current packs/day: 1.00    Average packs/day: 1 pack/day for 35.0 years (35.0 ttl pk-yrs)    Types: Cigarettes    Passive exposure: Current (Wife is a smoker)   Smokeless tobacco: Never   Tobacco comments:    1.5 ppd since age 55.   Vaping Use   Vaping status: Never Used  Substance and Sexual Activity   Alcohol use: Yes    Alcohol/week: 48.0 standard drinks of  alcohol    Types: 48 Cans of beer per week    Comment: patient states about 5 or 6 a day (beers)   Drug use: No   Sexual activity: Yes  Other Topics Concern   Not on file  Social History Narrative   Not on file   Social Drivers of Health   Tobacco Use: High Risk (11/04/2024)   Patient History    Smoking Tobacco Use: Every Day    Smokeless Tobacco Use: Never    Passive Exposure: Current  Financial Resource Strain: Low Risk (06/24/2024)   Overall Financial Resource Strain (CARDIA)    Difficulty of Paying Living Expenses: Not very hard  Food Insecurity: No Food Insecurity (06/24/2024)   Epic    Worried About Programme Researcher, Broadcasting/film/video in the Last Year: Never true    Ran Out of Food in the Last Year: Never true  Transportation Needs: Unmet Transportation Needs (06/24/2024)   Epic    Lack of Transportation (Medical): Yes    Lack of Transportation (Non-Medical): Yes  Physical Activity: Insufficiently Active (06/24/2024)   Exercise Vital Sign    Days of Exercise per Week: 1 day    Minutes of Exercise per Session: 10 min  Stress: Stress Concern Present (06/24/2024)   [employer] of Occupational Health - Occupational Stress Questionnaire    Feeling of Stress: To some extent  Social Connections: Moderately Isolated (06/24/2024)   Social Connection and Isolation Panel    Frequency of Communication with Friends and Family: Three times a week    Frequency of Social Gatherings with Friends and Family: Once a week    Attends Religious Services: Never    Database Administrator or Organizations: No    Attends Engineer, Structural: Not on file    Marital Status: Married  Depression (PHQ2-9): Low Risk (06/28/2024)   Depression (PHQ2-9)    PHQ-2 Score: 1  Alcohol Screen: Medium Risk (06/24/2024)   Alcohol Screen    Last Alcohol Screening Score (AUDIT): 14  Housing: Low Risk (06/24/2024)   Epic    Unable to Pay for Housing in the Last Year: No    Number of Times Moved in the Last Year: 0     Homeless in the Last Year: No  Utilities: Not At Risk (03/09/2024)   [facility] Utilities    Threatened with  loss of utilities: No  Health Literacy: Adequate Health Literacy (03/09/2024)   B1300 Health Literacy    Frequency of need for help with medical instructions: Never     Review of Systems: A 12 point ROS discussed and pertinent positives are indicated in the HPI above.  All other systems are negative.    Vital Signs: BP (!) 171/96 (BP Location: Right Arm)   Pulse 76   Temp 98.1 F (36.7 C) (Oral)   Resp 16   SpO2 92%    Physical Exam HENT:     Mouth/Throat:     Mouth: Mucous membranes are moist.  Cardiovascular:     Rate and Rhythm: Normal rate and regular rhythm.  Pulmonary:     Effort: Pulmonary effort is normal. No respiratory distress.     Breath sounds: Normal breath sounds.  Abdominal:     General: Bowel sounds are normal.     Palpations: Abdomen is soft.     Tenderness: There is no abdominal tenderness.  Skin:    General: Skin is warm.  Neurological:     Mental Status: He is alert and oriented to person, place, and time.  Psychiatric:        Mood and Affect: Mood normal.        Behavior: Behavior normal.     Imaging: No results found.  Labs:  CBC: Recent Labs    01/21/24 1029 06/28/24 1128  WBC 10.3 11.8*  HGB 17.2* 16.0  HCT 50.6 47.1  PLT 214 284    COAGS: Recent Labs    01/21/24 1029  INR 1.0    BMP: Recent Labs    01/21/24 1029 06/28/24 1128  NA 136 134*  K 4.2 4.3  CL 99 97*  CO2 27 27  GLUCOSE 119* 140*  BUN 12 12  CALCIUM  9.7 9.3  CREATININE 0.80 0.71  GFRNONAA >60  --     LIVER FUNCTION TESTS: Recent Labs    01/21/24 1029 06/28/24 1128 08/23/24 1216  BILITOT 0.7 0.3 0.3  AST 77* 46* 70*  ALT 89* 51* 84*  ALKPHOS 98  --  106  PROT 7.5 6.8 7.2  ALBUMIN 4.0  --  4.5    TUMOR MARKERS: No results for input(s): AFPTM, CEA, CA199, CHROMGRNA in the last 8760 hours.  Assessment and Plan: Left  supraclavicular lymphadenopathy-  Request for image guided left supraclavicular lymph node biopsy. No contraindications for procedure identified in ROS, physical exam, or review of pre-sedation considerations.  Labs reviewed and within acceptable range Imaging available and reviewed VSS, afebrile    Risks and benefits of image guided left supraclavicular lymph node biopsy was discussed with the patient and/or patient's family including, but not limited to bleeding, infection, damage to adjacent structures or low yield requiring additional tests.  All of the questions were answered and there is agreement to proceed.  Consent signed and in chart.    Thank you for allowing our service to participate in LEAF KERNODLE 's care.    Electronically Signed: Nariah Morgano B Jac Romulus, NP   11/04/2024, 12:28 PM     I spent a total of 15 Minutes in face to face in clinical consultation, greater than 50% of which was counseling/coordinating care for image guided left supraclavicular lymph node biopsy.   (A copy of this note was sent to the referring provider and the time of visit.)

## 2024-11-04 NOTE — Procedures (Signed)
 Interventional Radiology Procedure:   Indications:  Chest and left neck lymphadenopathy   Procedure: US  guided core biopsy and FNA of left supraclavicular lymph node  Findings: Left supraclavicular lymph node.  Technically challenging biopsy due to lymph node movement with breathing.  1 core biopsy and 5 FNAs.   Complications: None     EBL: Minimal  Plan:  Discharge to home in 1 hour   Jacaden Forbush R. Philip, MD  Pager: [phone]

## 2024-11-04 NOTE — Sedation Documentation (Signed)
 RN Rosina Essex and Ehan Freas pulled 2 mg Versed  and 100 mcg Fentanyl  in med room. Pt. Received 2 mg Versed  and 100 mcg Fentanyl  throughout the procedure.

## 2024-11-04 NOTE — Discharge Instructions (Signed)
 CONTACT INTERVENTIONAL RADIOLOGY CLINIC [phone] WITH ANY QUESTIONS OR CONCERNS  MAY REMOVE DRESSING AND SHOWER TOMORROW

## 2024-11-08 LAB — CYTOLOGY - NON PAP

## 2024-11-09 ENCOUNTER — Ambulatory Visit: Payer: Self-pay | Admitting: Pulmonary Disease

## 2024-11-09 DIAGNOSIS — C799 Secondary malignant neoplasm of unspecified site: Secondary | ICD-10-CM

## 2024-11-09 LAB — SURGICAL PATHOLOGY

## 2024-11-09 NOTE — Progress Notes (Signed)
 Introductory phone call placed to patient today. Introduced myself and explained my role in the patient's care. Provided a reminder of patient's upcoming appt and discussed Dr. Armanda recommendation for CancerType ID testing while patient is awaiting appt date. Patient agreeable to testing. Patient made aware of clinic's location and visitor policy. No further questions at this time per patient.

## 2024-11-21 ENCOUNTER — Encounter: Payer: Self-pay | Admitting: *Deleted

## 2024-11-29 ENCOUNTER — Inpatient Hospital Stay

## 2024-11-29 ENCOUNTER — Ambulatory Visit (HOSPITAL_COMMUNITY)
Admission: RE | Admit: 2024-11-29 | Discharge: 2024-11-29 | Disposition: A | Discharge status: Home / Self Care | Source: Ambulatory Visit | Attending: Pulmonary Disease | Admitting: Pulmonary Disease

## 2024-11-29 ENCOUNTER — Ambulatory Visit: Payer: Self-pay | Admitting: Pulmonary Disease

## 2024-11-29 ENCOUNTER — Inpatient Hospital Stay: Attending: Oncology | Admitting: Oncology

## 2024-11-29 VITALS — BP 129/83 | HR 82 | Temp 98.0°F | Resp 18 | Ht 68.0 in | Wt 223.8 lb

## 2024-11-29 DIAGNOSIS — F17210 Nicotine dependence, cigarettes, uncomplicated: Secondary | ICD-10-CM | POA: Insufficient documentation

## 2024-11-29 DIAGNOSIS — J449 Chronic obstructive pulmonary disease, unspecified: Secondary | ICD-10-CM | POA: Insufficient documentation

## 2024-11-29 DIAGNOSIS — Z79899 Other long term (current) drug therapy: Secondary | ICD-10-CM | POA: Diagnosis not present

## 2024-11-29 DIAGNOSIS — C799 Secondary malignant neoplasm of unspecified site: Secondary | ICD-10-CM | POA: Diagnosis present

## 2024-11-29 DIAGNOSIS — Z720 Tobacco use: Secondary | ICD-10-CM | POA: Diagnosis not present

## 2024-11-29 DIAGNOSIS — C801 Malignant (primary) neoplasm, unspecified: Secondary | ICD-10-CM | POA: Insufficient documentation

## 2024-11-29 DIAGNOSIS — F1010 Alcohol abuse, uncomplicated: Secondary | ICD-10-CM | POA: Diagnosis not present

## 2024-11-29 DIAGNOSIS — R590 Localized enlarged lymph nodes: Secondary | ICD-10-CM | POA: Insufficient documentation

## 2024-11-29 DIAGNOSIS — C3490 Malignant neoplasm of unspecified part of unspecified bronchus or lung: Secondary | ICD-10-CM | POA: Diagnosis not present

## 2024-11-29 LAB — PULMONARY FUNCTION TEST
DL/VA % pred: 98 %
DL/VA: 4.18 ml/min/mmHg/L
DLCO unc % pred: 85 %
DLCO unc: 22.12 ml/min/mmHg
FEF 25-75 Pre: 0.43 L/s
FEF2575-%Pred-Pre: 15 %
FEV1-%Pred-Pre: 35 %
FEV1-Pre: 1.19 L
FEV1FVC-%Pred-Pre: 67 %
FEV6-%Pred-Pre: 49 %
FEV6-Pre: 2.11 L
FEV6FVC-%Pred-Pre: 95 %
FVC-%Pred-Pre: 52 %
FVC-Pre: 2.31 L
Pre FEV1/FVC ratio: 51 %
Pre FEV6/FVC Ratio: 91 %
RV % pred: 180 %
RV: 3.85 L
TLC % pred: 103 %
TLC: 6.84 L

## 2024-11-29 NOTE — Patient Instructions (Addendum)
 [id number] - [facility]  Discharge Instructions  You were seen and examined today by Dr. Davonna. Dr. Davonna is a medical oncologist, meaning that she specializes in the treatment of cancer diagnoses. Dr. Davonna discussed your past medical history, family history of cancers, and the events that led to you being here today.  You were referred to Dr. Davonna due to a new diagnosis of cancer. It is most probable to be lung cancer, there is a test known as CancerType ID.  Dr. Davonna has recommended a repeat PET scan and brain MRI to complete the staging work-up. Dr. Davonna has also recommended having a Port-A-Cath placed in preparation of chemotherapy and immunotherapy.  Follow-up as scheduled.  Thank you for choosing [facility] to provide your oncology and hematology care.   To afford each patient quality time with our provider, please arrive at least 15 minutes before your scheduled appointment time. You may need to reschedule your appointment if you arrive late (10 or more minutes). Arriving late affects you and other patients whose appointments are after yours.  Also, if you miss three or more appointments without notifying the office, you may be dismissed from the clinic at the providers discretion.    Again, thank you for choosing [facility].  Our hope is that these requests will decrease the amount of time that you wait before being seen by our physicians.   If you have a lab appointment with the Cancer Center - please note that after April 8th, all labs will be drawn in the cancer center.  You do not have to check in or register with the main entrance as you have in the past but will complete your check-in at the cancer center.            _____________________________________________________________  Should you have questions after your visit to [facility], please contact our office at [phone] and follow the  prompts.  Our office hours are 8:00 a.m. to 4:30 p.m. Monday - Thursday and 8:00 a.m. to 2:30 p.m. Friday.  Please note that voicemails left after 4:00 p.m. may not be returned until the following business day.  We are closed weekends and all major holidays.  You do have access to a nurse 24-7, just call the main number to the clinic [phone] and do not press any options, hold on the line and a nurse will answer the phone.    For prescription refill requests, have your pharmacy contact our office and allow 72 hours.    Masks are no longer required in the cancer centers. If you would like for your care team to wear a mask while they are taking care of you, please let them know. You may have one support person who is at least 61 years old accompany you for your appointments.

## 2024-11-29 NOTE — Progress Notes (Signed)
 "  Hematology-Oncology Clinic Note  Jonathon Butler DASEN, MD   Reason for Referral: Metastatic carcinoma, primary unknown  Oncology History: I have reviewed his chart and materials related to his cancer extensively and collaborated history with the patient. Summary of oncologic history is as follows:  Diagnosis: Metastatic carcinoma of unknown origin  -07/20/2024: CT Lung Cancer Screening: Prevascular and AP window adenopathy, worrisome for metastatic disease. Early small cell lung cancer or a lymphoproliferative disorder not excluded.  -08/11/2024: Initial PET: Hypermetabolic mediastinal prevascular and left supraclavicular lymphadenopathy concerning for neoplastic process including lymphoproliferative disease (if lymphoma Deauville score 4) versus metastasis or due to infection/inflammation. A few solid and cavitary pulmonary nodules in bilateral lung apices, below PET resolution and unlikely to be related to FDG avid lymphadenopathy. No suspicious hypermetabolic findings within the abdomen pelvis or extremities. -11/04/2024: Left supraclavicular lymph node biopsy.  Pathology: Metastatic carcinoma. Immunohistochemical staining shows the atypical cells are positive for CK AE1/AE3 and TTF-1, while they are negative for p40, thyroglobulin, and synaptophysin. Additional stains including PAX8, chromogranin A, and Ki-67 were attempted but were noncontributory due to insufficient tumor cells. Overall, the findings are consistent with a metastatic carcinoma, with lung and thyroid  considered the most likely primary sites, and correlation with imaging studies is recommended. Cytology positive for malignant cells.    History of Presenting Illness: [name] is a 59 y.o. male referred by Catherine Cools, MD for metastatic carcinoma. He is accompanied by his wife today.  He has a medical history of hypertension, seizures, sleep apnea, arthritis, and GAD.  [name] had a lung cancer screening in  August 2025, which found prevascular and AP window adenopathy. PET was done in September 2025, showing hypermetabolic mediastinal prevascular and left supraclavicular lymphadenopathy. Biopsy of the left supraclavicular lymph node was done in December 2025 with pathology revealing metastatic carcinoma and lung or thyroid  the most likely primary sites. We discussed receiving further testing in the form of Cancer Type ID, as well as potential treatment options depending on the results.   [name] notes pain in the left sided occipital region of the brain. I will order MRI brain to rule out brain metastasis.   [name] reports DOE, particularly when anxious or when walking around his house. He states he is fairly active and has a normal appetite.   [name] live in [place], [place] with his wife. He is an active smoker of 1 ppd and drinks a 6 pack of beer a day.   [name] is on disability due to a spinal fusion, partial colectomy from diverticulitis, and multiple shoulder surgeries. He has one daughter and one son that are in their 78's.   Medical History: Past Medical History:  Diagnosis Date   Allergy    Anemia    Arthritis    Colonic diverticular abscess 12/26/2014   Depression    Diverticulitis    Dyspnea    ETOH abuse    GAD (generalized anxiety disorder)    GERD (gastroesophageal reflux disease)    History of fainting spells of unknown cause    Hypercholesteremia    Hypertension    Left rotator cuff tear 02/08/2019   Seizures (HCC)    Sleep apnea    Substance abuse (HCC)    Superior labrum anterior-to-posterior (SLAP) tear of left shoulder 02/08/2019   Tobacco abuse     Surgical history: Past Surgical History:  Procedure Laterality Date   APPENDECTOMY N/A 01/01/2015   Procedure: INCIDENTAL APPENDECTOMY;  Surgeon: Oneil DELENA Budge, MD;  Location:  [facility];  Service: General;  Laterality: N/A;   BACK SURGERY     lower back, fusion   BIOPSY  07/16/2023   Procedure: BIOPSY;  Surgeon:  San Sandor GAILS, DO;  Location: [facility];  Service: Gastroenterology;;   COLON SURGERY     COLONOSCOPY WITH PROPOFOL  N/A 12/21/2017   Procedure: COLONOSCOPY WITH PROPOFOL ;  Surgeon: Shaaron Lamar HERO, MD;  Location: [facility];  Service: Endoscopy;  Laterality: N/A;  9:45am   COLONOSCOPY WITH PROPOFOL  N/A 07/16/2023   Procedure: COLONOSCOPY WITH PROPOFOL ;  Surgeon: San Sandor GAILS, DO;  Location: [facility];  Service: Gastroenterology;  Laterality: N/A;   ESOPHAGOGASTRODUODENOSCOPY (EGD) WITH PROPOFOL  N/A 07/16/2023   Procedure: ESOPHAGOGASTRODUODENOSCOPY (EGD) WITH PROPOFOL ;  Surgeon: San Sandor GAILS, DO;  Location: [facility];  Service: Gastroenterology;  Laterality: N/A;   GASTRECTOMY N/A 04/14/2016   Procedure: ELIGIO;  Surgeon: Oneil Budge, MD;  Location: [facility];  Service: General;  Laterality: N/A;   HEMORRHOID SURGERY     HERNIA REPAIR     as child   HOT HEMOSTASIS N/A 07/16/2023   Procedure: HOT HEMOSTASIS (ARGON PLASMA COAGULATION/BICAP);  Surgeon: San Sandor GAILS, DO;  [facility];  Service: Gastroenterology;  Laterality: N/A;   IR US  GUIDE BX ASP/DRAIN  11/04/2024   LOOP RECORDER INSERTION N/A 09/06/2021   Procedure: LOOP RECORDER INSERTION;  Surgeon: Francyne Headland, MD;  Location: [facility];  Service: Cardiovascular;  Laterality: N/A;   PARTIAL COLECTOMY N/A 01/01/2015   Procedure: PARTIAL COLECTOMY;  Surgeon: Oneil DELENA Budge, MD;  Location: [facility];  Service: General;  Laterality: N/A;   POLYPECTOMY  12/21/2017   Procedure: POLYPECTOMY;  Surgeon: Shaaron Lamar HERO, MD;  Location: [facility];  Service: Endoscopy;;  colon   POLYPECTOMY  07/16/2023   Procedure: POLYPECTOMY;  Surgeon: San Sandor GAILS, DO;  Location: [facility];  Service: Gastroenterology;;   ROTATOR CUFF REPAIR Bilateral    SHOULDER ARTHROSCOPY WITH ROTATOR CUFF REPAIR AND SUBACROMIAL DECOMPRESSION Left 02/09/2019   Procedure: SHOULDER ARTHROSCOPY WITH ROTATOR  CUFF REPAIR AND SUBACROMIAL PARTIAL ACROMIOPLASTY, DISTAL CLAVICULECTOMY, AND EXTENSIVE DEBRIDEMENT;  Surgeon: Jane Lamar, MD;  Location: [facility];  Service: Orthopedics;  Laterality: Left;   SPINE SURGERY       Allergies:  has no known allergies.  Medications:  Current Outpatient Medications  Medication Sig Dispense Refill   albuterol  (VENTOLIN  HFA) 108 (90 Base) MCG/ACT inhaler Inhale 2 puffs into the lungs every 4 (four) hours as needed. 1 each 6   amLODipine  (NORVASC ) 10 MG tablet TAKE 1 TABLET BY MOUTH EVERY DAY 90 tablet 1   carvedilol  (COREG ) 12.5 MG tablet TAKE 1 TABLET (12.5MG  TOTAL) BY MOUTH TWICE A DAY WITH MEALS 180 tablet 1   clonazePAM  (KLONOPIN ) 0.5 MG tablet Take 1 tablet (0.5 mg total) by mouth 3 (three) times daily as needed for anxiety. 30 tablet 0   cyclobenzaprine  (FLEXERIL ) 10 MG tablet TAKE 1 TABLET BY MOUTH EVERYDAY AT BEDTIME (Patient taking differently: Take 10 mg by mouth at bedtime as needed for muscle spasms. TAKE 1 TABLET BY MOUTH EVERYDAY AT BEDTIME) 30 tablet 2   fluticasone  (FLONASE ) 50 MCG/ACT nasal spray SPRAY 2 SPRAYS INTO EACH NOSTRIL EVERY DAY (Patient taking differently: Place 2 sprays into both nostrils daily as needed. SPRAY 2 SPRAYS INTO EACH NOSTRIL EVERY DAY) 48 mL 3   losartan  (COZAAR ) 50 MG tablet Take 1 tablet (50 mg total) by mouth daily. 90 tablet 3   Menthol  (BIOFREEZE) 10.5 %  AERO Apply 1-2 sprays topically as needed (pain).     pantoprazole  (PROTONIX ) 40 MG tablet Take 1 tablet (40 mg total) by mouth daily. 90 tablet 1   sildenafil  (VIAGRA ) 100 MG tablet TAKE 1 TABLET BY MOUTH EVERY DAY AS NEEDED FOR ERECTILE DYSFUNCTION 10 tablet 11   venlafaxine  XR (EFFEXOR -XR) 150 MG 24 hr capsule TAKE 1 CAPSULE BY MOUTH EVERY DAY IN THE MORNING. 90 capsule 1   No current facility-administered medications for this visit.   Physical Examination: ECOG PERFORMANCE STATUS: 1 - Symptomatic but completely ambulatory  Vitals:   11/29/24  1103 11/29/24 1108  BP: (!) 147/92 129/83  Pulse: 82   Resp: 18   Temp: 98 F (36.7 C)   SpO2: 94% 96%   Filed Weights   11/29/24 1103  Weight: 223 lb 12.8 oz (101.5 kg)    GENERAL:alert, no distress and comfortable SKIN: skin color, texture, turgor are normal, no rashes or significant lesions LYMPH:  no palpable lymphadenopathy in the cervical, axillary or inguinal LUNGS: clear to auscultation and percussion with normal breathing effort HEART: regular rate & rhythm and no murmurs and no lower extremity edema ABDOMEN:abdomen soft, non-tender and normal bowel sounds Musculoskeletal:no cyanosis of digits and no clubbing  PSYCH: alert & oriented x 3 with fluent speech   Laboratory Data: I have reviewed the data as listed Lab Results  Component Value Date   WBC 11.1 (H) 11/04/2024   HGB 17.0 11/04/2024   HCT 50.2 11/04/2024   MCV 99.0 11/04/2024   PLT 236 11/04/2024      Chemistry      Component Value Date/Time   NA 134 (L) 06/28/2024 1128   NA 139 06/24/2022 1552   K 4.3 06/28/2024 1128   CL 97 (L) 06/28/2024 1128   CO2 27 06/28/2024 1128   BUN 12 06/28/2024 1128   BUN 12 06/24/2022 1552   CREATININE 0.71 06/28/2024 1128      Component Value Date/Time   CALCIUM  9.3 06/28/2024 1128   ALKPHOS 106 08/23/2024 1216   AST 70 (H) 08/23/2024 1216   ALT 84 (H) 08/23/2024 1216   BILITOT 0.3 08/23/2024 1216       Radiographic Studies: I have personally reviewed the radiological images as listed and agreed with the findings in the report.  CLINICAL DATA:  Initial treatment strategy for mediastinal lymphadenopathy   EXAM: NUCLEAR MEDICINE PET WHOLE BODY   TECHNIQUE: 11.8 mCi F-18 FDG was injected intravenously. Full-ring PET imaging was performed from the head to foot after the radiotracer. CT data was obtained and used for attenuation correction and anatomic localization.   Fasting blood glucose: 108 mg/dl   COMPARISON:  Chest CT July 20, 2024    FINDINGS: Mediastinal blood pool activity: SUV max 2.2   Liver max SUV 2.6   HEAD/NECK: There is a left supraclavicular lymph node measuring 1.5 cm with max SUV 3.5.   Incidental CT findings: Small right maxillary mucous retention cyst.   CHEST: Hypermetabolic lymph nodes in prevascular region measuring 2.1 x 1.5 cm and 1.3 x 2.1 cm with max SUV 8.8, stable to prior CT.   There is a 1.6 cm lymph node in anterior mediastinum without significant FDG uptake.   Focal scarring versus solid nodule in left lung apex posterior measures 6 mm (203/47), below PET resolution and non FDG avid. There is a cavitary nodule measuring 5 mm in right lung apex (203/47, non FDG avid.   These nodules are not metabolically active and  likely unrelated to hypermetabolic mediastinal lymphadenopathy.   Calcified nodule in right lung apex suggestive of prior granulomatous disease.   1.5 cm   Incidental CT findings: Left apical atherosclerotic calcifications of coronary arteries. Small pericardial fluid. Small hiatal hernia.Left chest wall loop recorder in place.   ABDOMEN/PELVIS: No suspicious lymphadenopathy. No other suspicious metabolic activity throughout the visceral organs.   Incidental CT findings: Hepatic steatosis. Left kidney hypodense and hyperdense renal cortical cysts. The cysts containing proteinaceous material measures 1.6 cm without significant FDG uptake. Other cysts are photopenic and also benign. Prostatomegaly. Bilateral fat containing inguinal hernias right greater than left. Postsurgical changes along the rectosigmoid from prior partial colectomy for diverticular disease.   SKELETON: No hypermetabolic suspicious lytic sclerotic osseous lesion.   Incidental CT findings: Degenerative changes of the spine.   EXTREMITIES: Focal muscle uptake along the plantar aspect of the left foot likely inflammatory. No hypermetabolic skin lesion.   Incidental CT findings: none    IMPRESSION: Hypermetabolic mediastinal prevascular and left supraclavicular lymphadenopathy concerning for neoplastic process including lymphoproliferative disease (if lymphoma Deauville score 4) versus metastasis or due to infection/inflammation. Correlate with clinical findings.   A few solid and cavitary pulmonary nodules in bilateral lung apices, below PET resolution and unlikely to be related to FDG avid lymphadenopathy. Follow-up to ensure stability.   No suspicious hypermetabolic findings within the abdomen pelvis or extremities.     Electronically Signed   By: Megan  Zare M.D.   On: 08/11/2024 18:18    ASSESSMENT & PLAN:  Patient is a 59 y.o. male presenting for metastatic carcinoma of unknown origin  Assessment and Plan  Metastatic carcinoma of unknown primary Pathology consistent with probable lung or thyroid  primary PET scan does not show primary cancer but does have hypermetabolic mediastinal prevascular and left supraclavicular lymphadenopathy.  -Discussed the above pathology results at this time.  Cancer type ID is pending.  Discussed that this likely is a lung cancer but help to differentiate small cell lung cancer versus non-small cell lung cancer.  If this is lung cancer, patient would be a stage IV lung cancer requiring systemic treatment in the form of chemotherapy with or without immunotherapy. - Will obtain MRI brain to rule out brain metastasis - Will present at multidisciplinary tumor board for further recommendations. - If cancer type ID does not result, we will probably have to do a biopsy, this time and excisional biopsy for multiple core biopsies. - Will reobtain a PET scan considering the last one was from September - Will schedule for IR guided port placement for future chemotherapy.  Discussed risk versus benefits in detail including most common side effects-bleeding, thrombosis and infection. -Will send [facility] NGS on the tissue. - Will consider  sending Guardant360 from blood  Return to clinic after the above imaging to discuss results and further management  Tobacco use Patient smokes up to 1 pack/day.  Discussed risk of tobacco use and recommended quitting.  - Patient is working on cutting down at this time.  Alcohol use Patient drinks 6 packs of beer a day  - Recommended cutting down alcohol use   Orders Placed This Encounter  Procedures   IR IMAGING GUIDED PORT INSERTION    Standing Status:   Future    Expected Date:   11/30/2024    Expiration Date:   11/29/2025    Reason for Exam (SYMPTOM  OR DIAGNOSIS REQUIRED):   chemotherapy administration    Preferred Imaging Location?:   [facility]  Release to patient:   Immediate   NM PET Image Restag (PS) Skull Base To Thigh    Standing Status:   Future    Expected Date:   12/06/2024    Expiration Date:   11/29/2025    If indicated for the ordered procedure, I authorize the administration of a radiopharmaceutical per Radiology protocol:   Yes    Preferred imaging location?:   [facility]    Release to patient:   Immediate   MR Brain W Wo Contrast    Standing Status:   Future    Expected Date:   12/06/2024    Expiration Date:   11/29/2025    If indicated for the ordered procedure, I authorize the administration of contrast media per Radiology protocol:   Yes    What is the patient's sedation requirement?:   No Sedation    Does the patient have a pacemaker or implanted devices?:   No    Use SRS Protocol?:   No    Preferred imaging location?:   [facility] (table limit - 500lbs)    Release to patient:   Immediate    The total time spent in the appointment was 40 minutes encounter with patients including review of chart and various tests results, discussions about plan of care and coordination of care plan   All questions were answered. The patient knows to call the clinic with any problems, questions or concerns. No barriers to learning was detected.  Mickiel Dry, MD 1/6/202610:21 PM "

## 2024-11-29 NOTE — Telephone Encounter (Signed)
 Please set up follow up in office within 2-4 weeks to discuss COPD.

## 2024-11-30 ENCOUNTER — Encounter (HOSPITAL_COMMUNITY): Payer: Self-pay

## 2024-11-30 ENCOUNTER — Ambulatory Visit (HOSPITAL_COMMUNITY)
Admission: RE | Admit: 2024-11-30 | Discharge: 2024-11-30 | Disposition: A | Discharge status: Home / Self Care | Source: Ambulatory Visit | Attending: Oncology | Admitting: Oncology

## 2024-11-30 ENCOUNTER — Other Ambulatory Visit (HOSPITAL_COMMUNITY): Payer: Self-pay | Admitting: Radiology

## 2024-11-30 DIAGNOSIS — C801 Malignant (primary) neoplasm, unspecified: Secondary | ICD-10-CM

## 2024-11-30 DIAGNOSIS — C3490 Malignant neoplasm of unspecified part of unspecified bronchus or lung: Secondary | ICD-10-CM | POA: Insufficient documentation

## 2024-11-30 MED ORDER — GADOBUTROL 1 MMOL/ML IV SOLN
10.0000 mL | Freq: Once | INTRAVENOUS | Status: AC | PRN
  Administered 2024-11-30: 10 mL via INTRAVENOUS

## 2024-11-30 NOTE — H&P (Addendum)
 "     Chief Complaint: Patient was seen in consultation today for metastatic carcinoma of uncertain etiology.   Referring Physician(s): Davonna Siad  Supervising Physician: Jennefer Rover  Patient Status: [facility] - Out-pt  History of Present Illness: [name] is a 59 y.o. male with a medical history significant for diverticulitis, depression, HTN, seizures, diverticulitis s/p partial colectomy, alcohol/tobacco use and recently diagnosed lung cancer. He underwent a routine CT chest for lung cancer screening 07/20/24 which showed adenopathy concerning for lung cancer. This study was followed by a PET scan 08/11/24 which was positive for hypermetabolic mediastinal prevascular and left supraclavicular lymphadenopathy. The patient was referred to Interventional Radiology for tissue sampling and underwent left supraclavicular lymph node biopsy 11/04/24. Pathology was positive for metastatic carcinoma with lung and thyroid  considered the most likely primary sites.  The tissue sample was sent for molecular testing but there was insufficient quantity. The patient's oncology team has requested port-a-catheter placement in anticipation of systemic therapy once a final diagnosis is determined.   Patient has been NPO since yesterday for today's procedure. He is not on any blood thinners. His wife, [name] is here for post procedure transportation and will be supervising him for the remainder of the day. He admitted to having shortness of breath, which is at his baseline. He does not need the assistance of home oxygen.   Past Medical History:  Diagnosis Date   Allergy    Anemia    Arthritis    Colonic diverticular abscess 12/26/2014   Depression    Diverticulitis    Dyspnea    ETOH abuse    GAD (generalized anxiety disorder)    GERD (gastroesophageal reflux disease)    History of fainting spells of unknown cause    Hypercholesteremia    Hypertension    Left rotator cuff tear 02/08/2019    Seizures (HCC)    Sleep apnea    Substance abuse (HCC)    Superior labrum anterior-to-posterior (SLAP) tear of left shoulder 02/08/2019   Tobacco abuse     Past Surgical History:  Procedure Laterality Date   APPENDECTOMY N/A 01/01/2015   Procedure: INCIDENTAL APPENDECTOMY;  Surgeon: Oneil DELENA Budge, MD;  Location: [facility];  Service: General;  Laterality: N/A;   BACK SURGERY     lower back, fusion   BIOPSY  07/16/2023   Procedure: BIOPSY;  Surgeon: San Sandor GAILS, DO;  Location: [facility];  Service: Gastroenterology;;   COLON SURGERY     COLONOSCOPY WITH PROPOFOL  N/A 12/21/2017   Procedure: COLONOSCOPY WITH PROPOFOL ;  Surgeon: Shaaron Lamar HERO, MD;  Location: [facility];  Service: Endoscopy;  Laterality: N/A;  9:45am   COLONOSCOPY WITH PROPOFOL  N/A 07/16/2023   Procedure: COLONOSCOPY WITH PROPOFOL ;  Surgeon: San Sandor GAILS, DO;  Location: [facility];  Service: Gastroenterology;  Laterality: N/A;   ESOPHAGOGASTRODUODENOSCOPY (EGD) WITH PROPOFOL  N/A 07/16/2023   Procedure: ESOPHAGOGASTRODUODENOSCOPY (EGD) WITH PROPOFOL ;  Surgeon: San Sandor GAILS, DO;  Location: [facility];  Service: Gastroenterology;  Laterality: N/A;   GASTRECTOMY N/A 04/14/2016   Procedure: ELIGIO;  Surgeon: Oneil Budge, MD;  Location: [facility];  Service: General;  Laterality: N/A;   HEMORRHOID SURGERY     HERNIA REPAIR     as child   HOT HEMOSTASIS N/A 07/16/2023   Procedure: HOT HEMOSTASIS (ARGON PLASMA COAGULATION/BICAP);  Surgeon: San Sandor GAILS, DO;  Location: [facility] ENDOSCOPY;  Service: Gastroenterology;  Laterality: N/A;   IR US  GUIDE BX ASP/DRAIN  11/04/2024   LOOP RECORDER INSERTION N/A  09/06/2021   Procedure: LOOP RECORDER INSERTION;  Surgeon: Francyne Headland, MD;  Location: [facility];  Service: Cardiovascular;  Laterality: N/A;   PARTIAL COLECTOMY N/A 01/01/2015   Procedure: PARTIAL COLECTOMY;  Surgeon: Oneil DELENA Budge, MD;  Location: [facility];  Service: General;  Laterality:  N/A;   POLYPECTOMY  12/21/2017   Procedure: POLYPECTOMY;  Surgeon: Shaaron Lamar HERO, MD;  Location: [facility];  Service: Endoscopy;;  colon   POLYPECTOMY  07/16/2023   Procedure: POLYPECTOMY;  Surgeon: San Sandor GAILS, DO;  Location: [facility];  Service: Gastroenterology;;   ROTATOR CUFF REPAIR Bilateral    SHOULDER ARTHROSCOPY WITH ROTATOR CUFF REPAIR AND SUBACROMIAL DECOMPRESSION Left 02/09/2019   Procedure: SHOULDER ARTHROSCOPY WITH ROTATOR CUFF REPAIR AND SUBACROMIAL PARTIAL ACROMIOPLASTY, DISTAL CLAVICULECTOMY, AND EXTENSIVE DEBRIDEMENT;  Surgeon: Jane Lamar, MD;  Location:  SURGERY CENTER;  Service: Orthopedics;  Laterality: Left;   SPINE SURGERY      Allergies: Patient has no known allergies.  Medications: Prior to Admission medications  Medication Sig Start Date End Date Taking? Authorizing Provider  albuterol  (VENTOLIN  HFA) 108 (90 Base) MCG/ACT inhaler Inhale 2 puffs into the lungs every 4 (four) hours as needed. 09/19/24   Alghanim, Paula, MD  amLODipine  (NORVASC ) 10 MG tablet TAKE 1 TABLET BY MOUTH EVERY DAY 11/01/24   Duanne Butler DASEN, MD  carvedilol  (COREG ) 12.5 MG tablet TAKE 1 TABLET (12.5MG  TOTAL) BY MOUTH TWICE A DAY WITH MEALS 05/03/24   Duanne Butler DASEN, MD  clonazePAM  (KLONOPIN ) 0.5 MG tablet Take 1 tablet (0.5 mg total) by mouth 3 (three) times daily as needed for anxiety. 11/01/24   Duanne Butler DASEN, MD  cyclobenzaprine  (FLEXERIL ) 10 MG tablet TAKE 1 TABLET BY MOUTH EVERYDAY AT BEDTIME Patient taking differently: Take 10 mg by mouth at bedtime as needed for muscle spasms. TAKE 1 TABLET BY MOUTH EVERYDAY AT BEDTIME 10/02/20   Duanne Butler DASEN, MD  fluticasone  (FLONASE ) 50 MCG/ACT nasal spray SPRAY 2 SPRAYS INTO EACH NOSTRIL EVERY DAY Patient taking differently: Place 2 sprays into both nostrils daily as needed. SPRAY 2 SPRAYS INTO EACH NOSTRIL EVERY DAY 12/21/23   Duanne Butler DASEN, MD  losartan  (COZAAR ) 50 MG tablet Take 1 tablet (50 mg total) by  mouth daily. 02/01/24   Duanne Butler DASEN, MD  Menthol  (BIOFREEZE) 10.5 % AERO Apply 1-2 sprays topically as needed (pain).    [provider]  pantoprazole  (PROTONIX ) 40 MG tablet Take 1 tablet (40 mg total) by mouth daily. 10/03/24   Duanne Butler DASEN, MD  sildenafil  (VIAGRA ) 100 MG tablet TAKE 1 TABLET BY MOUTH EVERY DAY AS NEEDED FOR ERECTILE DYSFUNCTION 07/05/24   Duanne Butler DASEN, MD  venlafaxine  XR (EFFEXOR -XR) 150 MG 24 hr capsule TAKE 1 CAPSULE BY MOUTH EVERY DAY IN THE MORNING. 10/06/24   Duanne Butler DASEN, MD     Family History  Problem Relation Age of Onset   Diabetes Mother    Stroke Mother    Heart disease Mother    Stroke Father    CAD Father    Heart disease Father    ADD / ADHD Son    Alcohol abuse Son    Alcohol abuse Maternal Uncle    Anxiety disorder Maternal Aunt    Depression Maternal Aunt    Alcohol abuse Maternal Uncle    Colon cancer Neg Hx     Social History   Socioeconomic History   Marital status: Married    Spouse name: Not on file  Number of children: Not on file   Years of education: Not on file   Highest education level: Associate degree: occupational, technical, or vocational program  Occupational History   Not on file  Tobacco Use   Smoking status: Every Day    Current packs/day: 1.00    Average packs/day: 1 pack/day for 35.0 years (35.0 ttl pk-yrs)    Types: Cigarettes    Passive exposure: Current (Wife is a smoker)   Smokeless tobacco: Never   Tobacco comments:    1.5 ppd since age 88.   Vaping Use   Vaping status: Never Used  Substance and Sexual Activity   Alcohol use: Yes    Alcohol/week: 48.0 standard drinks of alcohol    Types: 48 Cans of beer per week    Comment: patient states about 5 or 6 a day (beers)   Drug use: No   Sexual activity: Yes  Other Topics Concern   Not on file  Social History Narrative   Not on file   Social Drivers of Health   Tobacco Use: High Risk (11/04/2024)   Patient History     Smoking Tobacco Use: Every Day    Smokeless Tobacco Use: Never    Passive Exposure: Current  Financial Resource Strain: Low Risk (06/24/2024)   Overall Financial Resource Strain (CARDIA)    Difficulty of Paying Living Expenses: Not very hard  Food Insecurity: No Food Insecurity (11/29/2024)   Epic    Worried About Radiation Protection Practitioner of Food in the Last Year: Never true    Ran Out of Food in the Last Year: Never true  Transportation Needs: No Transportation Needs (11/29/2024)   Epic    Lack of Transportation (Medical): No    Lack of Transportation (Non-Medical): No  Physical Activity: Insufficiently Active (06/24/2024)   Exercise Vital Sign    Days of Exercise per Week: 1 day    Minutes of Exercise per Session: 10 min  Stress: Stress Concern Present (06/24/2024)   [employer] of Occupational Health - Occupational Stress Questionnaire    Feeling of Stress: To some extent  Social Connections: Moderately Isolated (06/24/2024)   Social Connection and Isolation Panel    Frequency of Communication with Friends and Family: Three times a week    Frequency of Social Gatherings with Friends and Family: Once a week    Attends Religious Services: Never    Database Administrator or Organizations: No    Attends Engineer, Structural: Not on file    Marital Status: Married  Depression (PHQ2-9): Medium Risk (11/29/2024)   Depression (PHQ2-9)    PHQ-2 Score: 6  Alcohol Screen: Medium Risk (06/24/2024)   Alcohol Screen    Last Alcohol Screening Score (AUDIT): 14  Housing: Low Risk (11/29/2024)   Epic    Unable to Pay for Housing in the Last Year: No    Number of Times Moved in the Last Year: 0    Homeless in the Last Year: No  Utilities: Not At Risk (11/29/2024)   Epic    Threatened with loss of utilities: No  Health Literacy: Adequate Health Literacy (03/09/2024)   B1300 Health Literacy    Frequency of need for help with medical instructions: Never   Review of Systems  Constitutional:  Negative for  chills and fever.  Respiratory:  Negative for cough.        Shortness of breath, patient has this at baseline. Does not use home oxygen. Hx of COPD  Cardiovascular:  Negative  for chest pain and leg swelling.  Gastrointestinal:  Negative for diarrhea, nausea and vomiting.    Vital Signs: BP (!) 141/84   Pulse 82   Temp 98.2 F (36.8 C) (Oral)   Resp 18   Ht 5' 8 (1.727 m)   Wt 228 lb (103.4 kg)   SpO2 92%   BMI 34.67 kg/m   Physical Exam Vitals reviewed.  Constitutional:      General: He is not in acute distress. HENT:     Head: Normocephalic and atraumatic.     Mouth/Throat:     Mouth: Mucous membranes are moist.     Pharynx: Oropharynx is clear.  Cardiovascular:     Rate and Rhythm: Normal rate and regular rhythm.     Pulses: Normal pulses.  Pulmonary:     Effort: Pulmonary effort is normal.  Abdominal:     Palpations: Abdomen is soft.  Musculoskeletal:     Right lower leg: No edema.     Left lower leg: No edema.  Skin:    General: Skin is warm and dry.  Neurological:     General: No focal deficit present.     Mental Status: He is alert and oriented to person, place, and time.  Psychiatric:        Mood and Affect: Mood normal.        Behavior: Behavior normal.    Labs:  CBC: Recent Labs    01/21/24 1029 06/28/24 1128 11/04/24 1227  WBC 10.3 11.8* 11.1*  HGB 17.2* 16.0 17.0  HCT 50.6 47.1 50.2  PLT 214 284 236    COAGS: Recent Labs    01/21/24 1029  INR 1.0    BMP: Recent Labs    01/21/24 1029 06/28/24 1128  NA 136 134*  K 4.2 4.3  CL 99 97*  CO2 27 27  GLUCOSE 119* 140*  BUN 12 12  CALCIUM  9.7 9.3  CREATININE 0.80 0.71  GFRNONAA >60  --     LIVER FUNCTION TESTS: Recent Labs    01/21/24 1029 06/28/24 1128 08/23/24 1216  BILITOT 0.7 0.3 0.3  AST 77* 46* 70*  ALT 89* 51* 84*  ALKPHOS 98  --  106  PROT 7.5 6.8 7.2  ALBUMIN 4.0  --  4.5    TUMOR MARKERS: No results for input(s): AFPTM, CEA, CA199, CHROMGRNA in  the last 8760 hours.  Assessment and Plan: Metastatic carcinoma of uncertain etiology:  Tanveer Brammer. Albanese, 59 year old male, presents to the [facility] Interventional Radiology department for an image-guided port-a-catheter placement.   Risks and benefits of image-guided port-a-catheter placement were discussed with the patient including, but not limited to bleeding, infection, pneumothorax, or fibrin sheath development and need for additional procedures.  All of the patient's questions were answered, patient is agreeable to proceed. He has been NPO.   Consent signed and in chart.  Thank you for this interesting consult.  I greatly enjoyed meeting BYNUM MCCULLARS and look forward to participating in their care.  A copy of this report was sent to the requesting provider on this date.  Electronically Signed: Warren Dais, AGACNP-BC 12/01/2024, 10:42 AM   I spent a total of  30 Minutes   in face to face in clinical consultation, greater than 50% of which was counseling/coordinating care for metastatic carcinoma.    "

## 2024-12-01 ENCOUNTER — Ambulatory Visit (HOSPITAL_COMMUNITY)
Admission: RE | Admit: 2024-12-01 | Discharge: 2024-12-01 | Disposition: A | Discharge status: Home / Self Care | Source: Ambulatory Visit | Attending: Oncology | Admitting: Oncology

## 2024-12-01 ENCOUNTER — Other Ambulatory Visit: Payer: Self-pay

## 2024-12-01 DIAGNOSIS — C3490 Malignant neoplasm of unspecified part of unspecified bronchus or lung: Secondary | ICD-10-CM | POA: Insufficient documentation

## 2024-12-01 DIAGNOSIS — F17210 Nicotine dependence, cigarettes, uncomplicated: Secondary | ICD-10-CM | POA: Insufficient documentation

## 2024-12-01 DIAGNOSIS — C801 Malignant (primary) neoplasm, unspecified: Secondary | ICD-10-CM

## 2024-12-01 HISTORY — PX: IR IMAGING GUIDED PORT INSERTION: IMG5740

## 2024-12-01 MED ORDER — LIDOCAINE-EPINEPHRINE (PF) 1 %-1:200000 IJ SOLN
20.0000 mL | Freq: Once | INTRAMUSCULAR | Status: AC
  Administered 2024-12-01: 20 mL

## 2024-12-01 MED ORDER — MIDAZOLAM HCL (PF) 2 MG/2ML IJ SOLN
INTRAMUSCULAR | Status: AC | PRN
  Administered 2024-12-01 (×2): 1 mg via INTRAVENOUS

## 2024-12-01 MED ORDER — MIDAZOLAM HCL 2 MG/2ML IJ SOLN
INTRAMUSCULAR | Status: AC
  Filled 2024-12-01: qty 2

## 2024-12-01 MED ORDER — FENTANYL CITRATE (PF) 100 MCG/2ML IJ SOLN
INTRAMUSCULAR | Status: AC | PRN
  Administered 2024-12-01 (×2): 50 ug via INTRAVENOUS

## 2024-12-01 MED ORDER — HEPARIN SOD (PORK) LOCK FLUSH 100 UNIT/ML IV SOLN
500.0000 [IU] | Freq: Once | INTRAVENOUS | Status: AC
  Administered 2024-12-01: 500 [IU] via INTRAVENOUS

## 2024-12-01 MED ORDER — SODIUM CHLORIDE 0.9 % IV SOLN: INTRAVENOUS | Status: DC

## 2024-12-01 MED ORDER — HEPARIN SOD (PORK) LOCK FLUSH 100 UNIT/ML IV SOLN
INTRAVENOUS | Status: AC
  Filled 2024-12-01: qty 5

## 2024-12-01 MED ORDER — LIDOCAINE-EPINEPHRINE 1 %-1:100000 IJ SOLN
INTRAMUSCULAR | Status: AC
  Filled 2024-12-01: qty 1

## 2024-12-01 MED ORDER — FENTANYL CITRATE (PF) 100 MCG/2ML IJ SOLN
INTRAMUSCULAR | Status: AC
  Filled 2024-12-01: qty 2

## 2024-12-01 NOTE — Procedures (Signed)
 Interventional Radiology Procedure Note  Procedure: Single Lumen Power Port Placement    Access:  Right internal jugular vein  Findings: Catheter tip positioned at cavoatrial junction. Port is ready for immediate use.   Complications: None  EBL: < 10 mL  Recommendations:  - Ok to shower in 24 hours - Do not submerge for 7 days - Routine line care    Ester Sides, MD

## 2024-12-02 ENCOUNTER — Inpatient Hospital Stay

## 2024-12-02 DIAGNOSIS — C3490 Malignant neoplasm of unspecified part of unspecified bronchus or lung: Secondary | ICD-10-CM

## 2024-12-02 NOTE — Progress Notes (Signed)
 The proposed treatment discussed in conference is for discussion purpose only and is not a binding recommendation.  The patients have not been physically examined, or presented with their treatment options.  Therefore, final treatment plans cannot be decided.

## 2024-12-03 ENCOUNTER — Other Ambulatory Visit: Payer: Self-pay | Admitting: Dermatology

## 2024-12-03 DIAGNOSIS — L700 Acne vulgaris: Secondary | ICD-10-CM

## 2024-12-07 ENCOUNTER — Ambulatory Visit (HOSPITAL_COMMUNITY)
Admission: RE | Admit: 2024-12-07 | Discharge: 2024-12-07 | Disposition: A | Discharge status: Home / Self Care | Source: Ambulatory Visit | Attending: Oncology | Admitting: Oncology

## 2024-12-07 ENCOUNTER — Telehealth: Payer: Self-pay | Admitting: Family Medicine

## 2024-12-07 DIAGNOSIS — C3490 Malignant neoplasm of unspecified part of unspecified bronchus or lung: Secondary | ICD-10-CM | POA: Diagnosis present

## 2024-12-07 DIAGNOSIS — R000 Tachycardia, unspecified: Secondary | ICD-10-CM

## 2024-12-07 LAB — GLUCOSE, CAPILLARY: Glucose-Capillary: 120 mg/dL — ABNORMAL HIGH (ref 70–99)

## 2024-12-07 MED ORDER — FLUDEOXYGLUCOSE F - 18 (FDG) INJECTION
11.4000 | Freq: Once | INTRAVENOUS | Status: AC
  Administered 2024-12-07: 11.4 via INTRAVENOUS

## 2024-12-07 NOTE — Telephone Encounter (Signed)
 Prescription Request  12/07/2024  LOV: 06/28/2024  What is the name of the medication or equipment?   carvedilol  (COREG ) 12.5 MG tablet  **90 day script requested**  Have you contacted your pharmacy to request a refill? Yes   Which pharmacy would you like this sent to?  [id number] - [place], [place] - [address] AT [facility] [address] [place] [address] Phone: [phone] Fax: [phone]    Patient notified that their request is being sent to the clinical staff for review and that they should receive a response within 2 business days.   Please advise pharmacist.

## 2024-12-09 MED ORDER — CARVEDILOL 12.5 MG PO TABS: 12.5000 mg | ORAL_TABLET | Freq: Two times a day (BID) | ORAL | 0 refills | Status: AC

## 2024-12-09 NOTE — Telephone Encounter (Signed)
 Requested Prescriptions  Pending Prescriptions Disp Refills   carvedilol  (COREG ) 12.5 MG tablet 180 tablet 0    Sig: Take 1 tablet (12.5 mg total) by mouth 2 (two) times daily with a meal.     Cardiovascular: Beta Blockers 3 Failed - 12/09/2024  5:17 PM      Failed - AST in normal range and within 360 days    AST  Date Value Ref Range Status  08/23/2024 70 (H) 0 - 40 IU/L Final         Failed - ALT in normal range and within 360 days    ALT  Date Value Ref Range Status  08/23/2024 84 (H) 0 - 44 IU/L Final         Failed - Last BP in normal range    BP Readings from Last 1 Encounters:  12/01/24 (!) 145/88         Passed - Cr in normal range and within 360 days    Creat  Date Value Ref Range Status  06/28/2024 0.71 0.70 - 1.30 mg/dL Final         Passed - Last Heart Rate in normal range    Pulse Readings from Last 1 Encounters:  12/01/24 90         Passed - Valid encounter within last 6 months    Recent Outpatient Visits           5 months ago Barrett's esophagus without dysplasia   [facility] Pickard, Butler DASEN, MD   1 year ago Pure hypercholesterolemia   [place] Pickard, Butler DASEN, MD   1 year ago GAD (generalized anxiety disorder)   [facility] Pickard, Butler DASEN, MD

## 2024-12-09 NOTE — Addendum Note (Signed)
 Encounter addended by: Crawford Dorothyann POUR on: 12/09/2024 10:34 AM  Actions taken: Imaging Exam ended, Charge Capture section accepted

## 2024-12-14 NOTE — Progress Notes (Signed)
 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 Guardant 360 did not reveal any targetable mutations   -We reviewed the PET scan findings together. Patient has enlarging mediastinal lymphadenopathy and ipsilateral supra clavicular lymphadenopathy -We reviewed the MRI brain findings together. NED. - Patient met with Dr.Morris yesterday. Plan to start chemo RT 12/29/2024. -We discussed some of the risks, benefits, side-effects of carboplatin & Paclitaxel -Some of the short term side-effects included, though not limited to, including weight loss, life threatening infections, risk of allergic reactions, need for transfusions of blood products, nausea, vomiting, change in bowel habits, loss of hair, admission to hospital for various reasons, and risks of death.  -Long term side-effects are also discussed including permanent damage to nerve function, hearing loss, chronic fatigue, kidney damage with possibility needing hemodialysis, and rare secondary malignancy including bone marrow disorders. -The patient is aware that the response rates discussed earlier is not  guaranteed.  After a long discussion, patient made an informed decision to proceed with the prescribed plan of care.  -Patient education material was dispensed. Ultimately,the patient agreed to proceed with plan of care -Will schedule for chemo educations session    Return to clinic with second cycle of chemotherapy   Tobacco use Patient smokes up to 1 pack/day.  Discussed risk of tobacco use and recommended quitting.   - Patient is working on cutting down at this time. -Provided information on quitting smoking   Alcohol use Patient drinks 6 packs of beer a day   - Recommended cutting down alcohol use   The patient understands the plans discussed today and is in agreement with them.  He knows to contact our office if he develops concerns prior to his next appointment.  21 minutes of total time was spent for this patient encounter, including preparation,face-to-face counseling with the patient and coordination of care, physical exam, and documentation of the encounter.    LILLETTE Verneta SAUNDERS Teague,acting as a neurosurgeon for Siad Davonna, MD.,have documented all relevant documentation on the behalf of Siad Davonna, MD,as directed by  Siad Davonna, MD while in the presence of Siad Davonna, MD.  I, Siad Davonna MD, have reviewed the above documentation for accuracy and completeness, and I agree with the above.    Siad Davonna, MD  [facility] [place] - A DEPT OF [facility] [address] [place] [address] Dept: [phone] Dept Fax: [phone]   No orders of the defined types were placed in this encounter.    ONCOLOGY HISTORY:   Diagnosis: Stage III Non-small cell lung carcinoma   -07/20/2024: CT Lung Cancer Screening: Prevascular and AP window adenopathy, worrisome for metastatic disease. Early small cell lung  cancer or a lymphoproliferative disorder not excluded.  -08/11/2024: Initial PET: Hypermetabolic  mediastinal prevascular and left supraclavicular lymphadenopathy concerning for neoplastic process including lymphoproliferative disease (if lymphoma Deauville score 4) versus metastasis or due to infection/inflammation. A few solid and cavitary pulmonary nodules in bilateral lung apices, below PET resolution and unlikely to be related to FDG avid lymphadenopathy. No suspicious hypermetabolic findings within the abdomen pelvis or extremities. -11/04/2024: Left supraclavicular lymph node biopsy.  Pathology: Metastatic carcinoma. Immunohistochemical staining shows the atypical cells are positive for CK AE1/AE3 and TTF-1, while they are negative for p40, thyroglobulin, and synaptophysin. Additional stains including PAX8, chromogranin A, and Ki-67 were attempted but were noncontributory due to insufficient tumor cells. Overall, the findings are consistent with a metastatic carcinoma, with lung and thyroid  considered the most likely primary sites, and correlation with imaging studies is recommended. Cytology positive for malignant cells.  -11/30/2024: MRI Brain: NED -11/30/2024: CancerType ID not performed due to insufficient cells.  -12/01/2024: IR guided port placement -12/01/2024: MDTB pathology review: More consistent with primary lung carcinoma- non small cell type -12/07/2024: PET: Progressive hypermetabolic left supraclavicular and mediastinal adenopathy consistent with progressive metastatic disease. No evidence of metastatic disease in the abdomen or pelvis.  Current Treatment:  Planned chemo RT carboplatin and paclitaxel  INTERVAL HISTORY:   [name] is here today for follow-up of likely metastatic non-small cell lung cancer. He is accompanied by his wife today.   [name] was seen by Dr. Dannielle yesterday and is planning to start radiation on the week of 12/29/2024. He had Guardant 360 done, which showed a 99% confidence of his cancer being non-small cell lung cancer. We discussed his  treatment, which is CRT with carboplatin and paclitaxel, including the regimen and side effects of chemotherapy. We also discussed future treatment being immunotherapy for one year if he has response.   He tolerated his port placement well, though he does note some ecchymosis around the area.   He lives in [place], [place] with his wife. He continues to be an active smoker and has not cut down his tobacco use.   I have reviewed the past medical history, past surgical history, social history and family history with the patient and they are unchanged from previous note.  ALLERGIES:  has no known allergies.  MEDICATIONS:  Current Outpatient Medications  Medication Sig Dispense Refill   albuterol  (VENTOLIN  HFA) 108 (90 Base) MCG/ACT inhaler Inhale 2 puffs into the lungs every 4 (four) hours as needed. 1 each 6   amLODipine  (NORVASC ) 10 MG tablet TAKE 1 TABLET BY MOUTH EVERY DAY 90 tablet 1   atorvastatin  (LIPITOR) 40 MG tablet Take by mouth.     carvedilol  (COREG ) 12.5 MG tablet Take 1 tablet (12.5 mg total) by mouth 2 (two) times daily with a meal. 180 tablet 0   clonazePAM  (KLONOPIN ) 0.5 MG tablet Take 1 tablet (0.5 mg total) by mouth 3 (three) times daily as needed for anxiety. 30 tablet 0   ferrous sulfate 325 (65 FE) MG tablet Take by mouth.     fluticasone  (FLONASE ) 50 MCG/ACT nasal spray SPRAY 2 SPRAYS INTO EACH NOSTRIL EVERY DAY (Patient taking differently: Place 2 sprays into both nostrils daily as needed. SPRAY 2 SPRAYS INTO EACH NOSTRIL EVERY DAY) 48 mL 3   losartan -hydrochlorothiazide (HYZAAR) 50-12.5 MG tablet Take by mouth.     Menthol  (BIOFREEZE) 10.5 % AERO Apply 1-2 sprays topically as needed (pain).     mupirocin  ointment (BACTROBAN ) 2 % Apply topically.  pantoprazole  (PROTONIX ) 40 MG tablet Take 1 tablet (40 mg total) by mouth daily. 90 tablet 1   sildenafil  (VIAGRA ) 100 MG tablet TAKE 1 TABLET BY MOUTH EVERY DAY AS NEEDED FOR ERECTILE DYSFUNCTION 10 tablet 11   venlafaxine  XR  (EFFEXOR -XR) 150 MG 24 hr capsule TAKE 1 CAPSULE BY MOUTH EVERY DAY IN THE MORNING. 90 capsule 1   cyclobenzaprine  (FLEXERIL ) 10 MG tablet TAKE 1 TABLET BY MOUTH EVERYDAY AT BEDTIME (Patient not taking: Reported on 12/15/2024) 30 tablet 2   losartan  (COZAAR ) 50 MG tablet Take 1 tablet (50 mg total) by mouth daily. (Patient not taking: Reported on 12/15/2024) 90 tablet 3   No current facility-administered medications for this visit.   VITALS:  Blood pressure 130/86, pulse 74, temperature 98.1 F (36.7 C), temperature source Tympanic, resp. rate 18, height 5' 8 (1.727 m), weight 221 lb (100.2 kg), SpO2 95%.  Wt Readings from Last 3 Encounters:  12/15/24 221 lb (100.2 kg)  12/01/24 228 lb (103.4 kg)  11/29/24 223 lb 12.8 oz (101.5 kg)    Body mass index is 33.6 kg/m.  Performance status (ECOG): 0 - Asymptomatic  PHYSICAL EXAM:   GENERAL:alert, no distress and comfortable SKIN: skin color, texture, turgor are normal, no rashes or significant lesions, port site clean LYMPH:  no palpable lymphadenopathy in the cervical, axillary or inguinal LUNGS: clear to auscultation and percussion with normal breathing effort HEART: regular rate & rhythm and no murmurs and no lower extremity edema ABDOMEN:abdomen soft, non-tender and normal bowel sounds Musculoskeletal:no cyanosis of digits and no clubbing  NEURO: alert & oriented x 3 with fluent speech  LABORATORY DATA:  I have reviewed the data as listed     Component Value Date/Time   NA 134 (L) 06/28/2024 1128   NA 139 06/24/2022 1552   K 4.3 06/28/2024 1128   CL 97 (L) 06/28/2024 1128   CO2 27 06/28/2024 1128   GLUCOSE 140 (H) 06/28/2024 1128   BUN 12 06/28/2024 1128   BUN 12 06/24/2022 1552   CREATININE 0.71 06/28/2024 1128   CALCIUM  9.3 06/28/2024 1128   PROT 7.2 08/23/2024 1216   ALBUMIN 4.5 08/23/2024 1216   AST 70 (H) 08/23/2024 1216   ALT 84 (H) 08/23/2024 1216   ALKPHOS 106 08/23/2024 1216   BILITOT 0.3 08/23/2024 1216    GFRNONAA >60 01/21/2024 1029   GFRNONAA 101 01/31/2021 1149   GFRAA 117 01/31/2021 1149     Lab Results  Component Value Date   WBC 11.1 (H) 11/04/2024   NEUTROABS 8,343 (H) 06/28/2024   HGB 17.0 11/04/2024   HCT 50.2 11/04/2024   MCV 99.0 11/04/2024   PLT 236 11/04/2024     Chemistry      Component Value Date/Time   NA 134 (L) 06/28/2024 1128   NA 139 06/24/2022 1552   K 4.3 06/28/2024 1128   CL 97 (L) 06/28/2024 1128   CO2 27 06/28/2024 1128   BUN 12 06/28/2024 1128   BUN 12 06/24/2022 1552   CREATININE 0.71 06/28/2024 1128      Component Value Date/Time   CALCIUM  9.3 06/28/2024 1128   ALKPHOS 106 08/23/2024 1216   AST 70 (H) 08/23/2024 1216   ALT 84 (H) 08/23/2024 1216   BILITOT 0.3 08/23/2024 1216       RADIOGRAPHIC STUDIES: I have personally reviewed the radiological images as listed and agreed with the findings in the report.  NM PET Image Restag (PS) Skull Base To Thigh CLINICAL DATA:  Subsequent treatment strategy for non-small cell lung cancer.  EXAM: NUCLEAR MEDICINE PET SKULL BASE TO THIGH  TECHNIQUE: 11.41 mCi F-18 FDG was injected intravenously. Full-ring PET imaging was performed from the skull base to thigh after the radiotracer. CT data was obtained and used for attenuation correction and anatomic localization.  Fasting blood glucose: 120 mg/dl  COMPARISON:  PET-CT 90/81/7974. Chest CT 07/20/2024. Abdominal CT 04/14/2016.  FINDINGS: Mediastinal blood pool activity: SUV max 3.0  NECK:  Enlarging and increasingly hypermetabolic left supraclavicular lymph nodes, measuring up to 1.2 cm on image 51/4 (SUV max 15.3).Previously 3.5. No other hypermetabolic nodal activity in the neck. No suspicious activity identified within the pharyngeal mucosal space.  Incidental CT findings: Bilateral carotid atherosclerosis.  CHEST:  Progressive mediastinal adenopathy with enlarging and increasingly hypermetabolic left superior mediastinal and AP  window lymph nodes. AP window node now measures 2.5 cm on image 74/4 and has an SUV max of 10.8 (previously 8.8). No other hypermetabolic thoracic lymph nodes. No hypermetabolic pulmonary activity or suspicious nodularity.  Incidental CT findings: Unchanged low-density nodule in the pre-vascular space measuring 2.0 x 1.7 cm on image 70/4. This may reflect a necrotic lymph node or postsurgical fluid collection. Right IJ Port-A-Cath extends to the superior cavoatrial junction. Atherosclerosis of the aorta, great vessels and coronary arteries. Previously noted small biapical nodules are less evident, likely postinflammatory.  ABDOMEN/PELVIS:  There is no hypermetabolic activity within the liver, adrenal glands, spleen or pancreas. There is no hypermetabolic nodal activity in the abdomen or pelvis.  Incidental CT findings: Hepatic steatosis, left renal cyst, aortic and branch vessel atherosclerosis, prostatomegaly, postsurgical changes in the left colon and prominent fat in both inguinal canals are unchanged.  SKELETON:  There is no hypermetabolic activity to suggest osseous metastatic disease.  Incidental CT findings: Mild spondylosis. Stable small sebaceous cyst in the mid left back.  IMPRESSION: 1. Progressive hypermetabolic left supraclavicular and mediastinal adenopathy consistent with progressive metastatic disease. 2. No evidence of metastatic disease in the abdomen or pelvis. 3.  Aortic Atherosclerosis (ICD10-I70.0).  Electronically Signed   By: Elsie Perone M.D.   On: 12/12/2024 13:11     "

## 2024-12-15 ENCOUNTER — Inpatient Hospital Stay: Admitting: Oncology

## 2024-12-15 VITALS — BP 130/86 | HR 74 | Temp 98.1°F | Resp 18 | Ht 68.0 in | Wt 221.0 lb

## 2024-12-15 DIAGNOSIS — Z720 Tobacco use: Secondary | ICD-10-CM | POA: Diagnosis not present

## 2024-12-15 DIAGNOSIS — C3492 Malignant neoplasm of unspecified part of left bronchus or lung: Secondary | ICD-10-CM | POA: Diagnosis not present

## 2024-12-15 DIAGNOSIS — C799 Secondary malignant neoplasm of unspecified site: Secondary | ICD-10-CM | POA: Diagnosis not present

## 2024-12-15 DIAGNOSIS — F1010 Alcohol abuse, uncomplicated: Secondary | ICD-10-CM

## 2024-12-15 NOTE — Patient Instructions (Addendum)
 [facility] at [facility] Discharge Instructions   You were seen and examined today by Dr. Davonna.  She discussed with you the diagnosis of non-small cell lung cancer. We will plan for you to receive chemotherapy one day a week in our clinic. The two drugs you will receive are carboplatin and paclitaxel (Taxol). These will be given in conjunction with radiation therapy. Radiation treatments are given 5 days a week, Monday-Friday.    We will proceed with your treatment the first week of February. We will arrange for you to have an education session with one of our nurses prior to this visit. She will discuss side effects you may experience, how to manage those at home, when to call the clinic, and what to expect during your treatments here in the clinic.  Return as scheduled.    Thank you for choosing [facility] at [facility] to provide your oncology and hematology care.  To afford each patient quality time with our provider, please arrive at least 15 minutes before your scheduled appointment time.   If you have a lab appointment with the Cancer Center please come in thru the Main Entrance and check in at the main information desk.  You need to re-schedule your appointment should you arrive 10 or more minutes late.  We strive to give you quality time with our providers, and arriving late affects you and other patients whose appointments are after yours.  Also, if you no show three or more times for appointments you may be dismissed from the clinic at the providers discretion.     Again, thank you for choosing [facility].  Our hope is that these requests will decrease the amount of time that you wait before being seen by our physicians.       _____________________________________________________________  Should you have questions after your visit to [facility], please contact our office at [phone] and follow the  prompts.  Our office hours are 8:00 a.m. and 4:30 p.m. Monday - Friday.  Please note that voicemails left after 4:00 p.m. may not be returned until the following business day.  We are closed weekends and major holidays.  You do have access to a nurse 24-7, just call the main number to the clinic [phone] and do not press any options, hold on the line and a nurse will answer the phone.    For prescription refill requests, have your pharmacy contact our office and allow 72 hours.    Due to Covid, you will need to wear a mask upon entering the hospital. If you do not have a mask, a mask will be given to you at the Main Entrance upon arrival. For doctor visits, patients may have 1 support person age 59 or older with them. For treatment visits, patients can not have anyone with them due to social distancing guidelines and our immunocompromised population.

## 2024-12-22 ENCOUNTER — Other Ambulatory Visit: Payer: Self-pay | Admitting: Oncology

## 2024-12-22 ENCOUNTER — Encounter: Payer: Self-pay | Admitting: Family Medicine

## 2024-12-22 ENCOUNTER — Ambulatory Visit: Admitting: Family Medicine

## 2024-12-22 VITALS — BP 130/80 | HR 87 | Temp 98.1°F | Ht 68.0 in | Wt 217.2 lb

## 2024-12-22 DIAGNOSIS — B9689 Other specified bacterial agents as the cause of diseases classified elsewhere: Secondary | ICD-10-CM

## 2024-12-22 DIAGNOSIS — J019 Acute sinusitis, unspecified: Secondary | ICD-10-CM | POA: Diagnosis not present

## 2024-12-22 DIAGNOSIS — C3492 Malignant neoplasm of unspecified part of left bronchus or lung: Secondary | ICD-10-CM

## 2024-12-22 MED ORDER — AMOXICILLIN-POT CLAVULANATE 875-125 MG PO TABS: 1.0000 | ORAL_TABLET | Freq: Two times a day (BID) | ORAL | 0 refills | Status: AC

## 2024-12-22 NOTE — Progress Notes (Signed)
 "  Subjective:    Patient ID: [name], male    DOB: 12/20/1965, 59 y.o.   MRN: [id number]  Patient reports a 1 month history of headache.  The headache is located above his left eye.  He also reports pain and pressure in his left maxillary sinus and in his left ear.  Had an MRI in January that showed mucosal inflammation in the maxillary sinuses bilaterally.  He reports postnasal drip and cough.  Patient reports blowing bloody and green mucus out of his nose.  Patient has metastatic lung cancer and begins chemo next week Past Medical History:  Diagnosis Date   Allergy    Anemia    Arthritis    Colonic diverticular abscess 12/26/2014   Depression    Diverticulitis    Dyspnea    ETOH abuse    GAD (generalized anxiety disorder)    GERD (gastroesophageal reflux disease)    History of fainting spells of unknown cause    Hypercholesteremia    Hypertension    Left rotator cuff tear 02/08/2019   Seizures (HCC)    Sleep apnea    Substance abuse (HCC)    Superior labrum anterior-to-posterior (SLAP) tear of left shoulder 02/08/2019   Tobacco abuse    Past Surgical History:  Procedure Laterality Date   APPENDECTOMY N/A 01/01/2015   Procedure: INCIDENTAL APPENDECTOMY;  Surgeon: Oneil DELENA Budge, MD;  Location: [facility];  Service: General;  Laterality: N/A;   BACK SURGERY     lower back, fusion   BIOPSY  07/16/2023   Procedure: BIOPSY;  Surgeon: San Sandor GAILS, DO;  Location: [facility];  Service: Gastroenterology;;   COLON SURGERY     COLONOSCOPY WITH PROPOFOL  N/A 12/21/2017   Procedure: COLONOSCOPY WITH PROPOFOL ;  Surgeon: Shaaron Lamar HERO, MD;  Location: [facility];  Service: Endoscopy;  Laterality: N/A;  9:45am   COLONOSCOPY WITH PROPOFOL  N/A 07/16/2023   Procedure: COLONOSCOPY WITH PROPOFOL ;  Surgeon: San Sandor GAILS, DO;  Location: [facility];  Service: Gastroenterology;  Laterality: N/A;   ESOPHAGOGASTRODUODENOSCOPY (EGD) WITH PROPOFOL  N/A  07/16/2023   Procedure: ESOPHAGOGASTRODUODENOSCOPY (EGD) WITH PROPOFOL ;  Surgeon: San Sandor GAILS, DO;  [facility];  Service: Gastroenterology;  Laterality: N/A;   GASTRECTOMY N/A 04/14/2016   Procedure: ELIGIO;  Surgeon: Oneil Budge, MD;  Location: [facility];  Service: General;  Laterality: N/A;   HEMORRHOID SURGERY     HERNIA REPAIR     as child   HOT HEMOSTASIS N/A 07/16/2023   Procedure: HOT HEMOSTASIS (ARGON PLASMA COAGULATION/BICAP);  Surgeon: San Sandor GAILS, DO;  Location: [facility]OS[facility];  Service: Gastroenterology;  Laterality: N/A;   IR IMAGING GUIDED PORT INSERTION  12/01/2024   IR US  GUIDE BX ASP/DRAIN  11/04/2024   LOOP RECORDER INSERTION N/A 09/06/2021   Procedure: LOOP RECORDER INSERTION;  Surgeon: Francyne Headland, MD;  [facility]: [facility];  Service: Cardiovascular;  Laterality: N/A;   PARTIAL COLECTOMY N/A 01/01/2015   Procedure: PARTIAL COLECTOMY;  Surgeon: Oneil DELENA Budge, MD;  Location: [facility];  Service: General;  Laterality: N/A;   POLYPECTOMY  12/21/2017   Procedure: POLYPECTOMY;  Surgeon: Shaaron Lamar HERO, MD;  [facility];  Service: Endoscopy;;  colon   POLYPECTOMY  07/16/2023   Procedure: POLYPECTOMY;  Surgeon: San Sandor GAILS, DO;  [facility];  Service: Gastroenterology;;   ROTATOR CUFF REPAIR Bilateral    SHOULDER ARTHROSCOPY WITH ROTATOR CUFF REPAIR AND SUBACROMIAL DECOMPRESSION Left 02/09/2019   Procedure: SHOULDER ARTHROSCOPY WITH  ROTATOR CUFF REPAIR AND SUBACROMIAL PARTIAL ACROMIOPLASTY, DISTAL CLAVICULECTOMY, AND EXTENSIVE DEBRIDEMENT;  Surgeon: Jane Charleston, MD;  Location: [facility];  Service: Orthopedics;  Laterality: Left;   SPINE SURGERY     Current Outpatient Medications on File Prior to Visit  Medication Sig Dispense Refill   albuterol  (VENTOLIN  HFA) 108 (90 Base) MCG/ACT inhaler Inhale 2 puffs into the lungs every 4 (four) hours as needed. 1 each 6   amLODipine   (NORVASC ) 10 MG tablet TAKE 1 TABLET BY MOUTH EVERY DAY 90 tablet 1   atorvastatin  (LIPITOR) 40 MG tablet Take by mouth.     carvedilol  (COREG ) 12.5 MG tablet Take 1 tablet (12.5 mg total) by mouth 2 (two) times daily with a meal. 180 tablet 0   clonazePAM  (KLONOPIN ) 0.5 MG tablet Take 1 tablet (0.5 mg total) by mouth 3 (three) times daily as needed for anxiety. 30 tablet 0   ferrous sulfate 325 (65 FE) MG tablet Take by mouth.     fluticasone  (FLONASE ) 50 MCG/ACT nasal spray SPRAY 2 SPRAYS INTO EACH NOSTRIL EVERY DAY (Patient taking differently: Place 2 sprays into both nostrils daily as needed. SPRAY 2 SPRAYS INTO EACH NOSTRIL EVERY DAY) 48 mL 3   losartan -hydrochlorothiazide (HYZAAR) 50-12.5 MG tablet Take by mouth.     Menthol  (BIOFREEZE) 10.5 % AERO Apply 1-2 sprays topically as needed (pain).     sildenafil  (VIAGRA ) 100 MG tablet TAKE 1 TABLET BY MOUTH EVERY DAY AS NEEDED FOR ERECTILE DYSFUNCTION 10 tablet 11   venlafaxine  XR (EFFEXOR -XR) 150 MG 24 hr capsule TAKE 1 CAPSULE BY MOUTH EVERY DAY IN THE MORNING. 90 capsule 1   cyclobenzaprine  (FLEXERIL ) 10 MG tablet TAKE 1 TABLET BY MOUTH EVERYDAY AT BEDTIME (Patient not taking: Reported on 12/15/2024) 30 tablet 2   losartan  (COZAAR ) 50 MG tablet Take 1 tablet (50 mg total) by mouth daily. (Patient not taking: Reported on 12/15/2024) 90 tablet 3   mupirocin  ointment (BACTROBAN ) 2 % Apply topically.     pantoprazole  (PROTONIX ) 40 MG tablet Take 1 tablet (40 mg total) by mouth daily. (Patient not taking: Reported on 12/22/2024) 90 tablet 1   No current facility-administered medications on file prior to visit.   No Known Allergies Social History   Socioeconomic History   Marital status: Married    Spouse name: Not on file   Number of children: Not on file   Years of education: Not on file   Highest education level: Associate degree: occupational, scientist, product/process development, or vocational program  Occupational History   Not on file  Tobacco  Use   Smoking status: Every Day    Current packs/day: 1.00    Average packs/day: 1 pack/day for 35.0 years (35.0 ttl pk-yrs)    Types: Cigarettes    Passive exposure: Current (Wife is a smoker)   Smokeless tobacco: Never   Tobacco comments:    1.5 ppd since age 49.   Vaping Use   Vaping status: Never Used  Substance and Sexual Activity   Alcohol use: Yes    Alcohol/week: 48.0 standard drinks of alcohol    Types: 48 Cans of beer per week    Comment: patient states about 5 or 6 a day (beers)   Drug use: No   Sexual activity: Yes  Other Topics Concern   Not on file  Social History Narrative   Not on file   Social Drivers of Health   Tobacco Use: High Risk (12/22/2024)   Patient History    Smoking  Tobacco Use: Every Day    Smokeless Tobacco Use: Never    Passive Exposure: Current  Financial Resource Strain: Low Risk (06/24/2024)   Overall Financial Resource Strain (CARDIA)    Difficulty of Paying Living Expenses: Not very hard  Food Insecurity: No Food Insecurity (12/14/2024)   Received from [facility]   Epic    Within the past 12 months, you worried that your food would run out before you got the money to buy more.: Never true    Within the past 12 months, the food you bought just didn't last and you didn't have money to get more.: Never true  Transportation Needs: No Transportation Needs (12/14/2024)   Received from [facility]   PRAPARE - Transportation    Lack of Transportation (Medical): No    Lack of Transportation (Non-Medical): No  Physical Activity: Insufficiently Active (06/24/2024)   Exercise Vital Sign    Days of Exercise per Week: 1 day    Minutes of Exercise per Session: 10 min  Stress: Stress Concern Present (06/24/2024)   [employer] of Occupational Health - Occupational Stress Questionnaire    Feeling of Stress: To some extent  Social Connections: Moderately Isolated (06/24/2024)   Social Connection and Isolation Panel     Frequency of Communication with Friends and Family: Three times a week    Frequency of Social Gatherings with Friends and Family: Once a week    Attends Religious Services: Never    Database Administrator or Organizations: No    Attends Engineer, Structural: Not on file    Marital Status: Married  Catering Manager Violence: Not At Risk (11/29/2024)   Epic    Fear of Current or Ex-Partner: No    Emotionally Abused: No    Physically Abused: No    Sexually Abused: No  Depression (PHQ2-9): Low Risk (12/15/2024)   Depression (PHQ2-9)    PHQ-2 Score: 0  Recent Concern: Depression (PHQ2-9) - Medium Risk (11/29/2024)   Depression (PHQ2-9)    PHQ-2 Score: 6  Alcohol Screen: Medium Risk (06/24/2024)   Alcohol Screen    Last Alcohol Screening Score (AUDIT): 14  Housing: Low Risk (11/29/2024)   Epic    Unable to Pay for Housing in the Last Year: No    Number of Times Moved in the Last Year: 0    Homeless in the Last Year: No  Utilities: Low Risk (12/14/2024)   Received from [facility]   Utilities    Within the past 12 months, have you been unable to get utilities(heat, electricity) when it was really needed?: No  Health Literacy: Adequate Health Literacy (03/09/2024)   B1300 Health Literacy    Frequency of need for help with medical instructions: Never      Review of Systems  All other systems reviewed and are negative.      Objective:   Physical Exam Vitals reviewed.  Constitutional:      Appearance: Normal appearance. He is well-developed. He is obese.  HENT:     Nose: Congestion and rhinorrhea present.     Left Sinus: Maxillary sinus tenderness and frontal sinus tenderness present.  Eyes:     Conjunctiva/sclera: Conjunctivae normal.     Pupils: Pupils are equal, round, and reactive to light.  Cardiovascular:     Rate and Rhythm: Normal rate and regular rhythm.     Pulses: Normal pulses.     Heart sounds: Normal heart sounds. No murmur heard.  No  friction rub. No gallop.  Pulmonary:     Effort: Pulmonary effort is normal. No respiratory distress.     Breath sounds: Normal breath sounds. No wheezing or rales.  Abdominal:     General: Bowel sounds are normal. There is no distension.     Palpations: Abdomen is soft.     Tenderness: There is no abdominal tenderness. There is no guarding or rebound.  Musculoskeletal:     Cervical back: Neck supple.  Lymphadenopathy:     Cervical: No cervical adenopathy.  Neurological:     General: No focal deficit present.     Mental Status: He is alert and oriented to person, place, and time. Mental status is at baseline.     Cranial Nerves: No cranial nerve deficit.     Sensory: No sensory deficit.     Motor: No weakness or abnormal muscle tone.     Coordination: Coordination normal.     Gait: Gait normal.     Deep Tendon Reflexes: Reflexes are normal and symmetric.           Assessment & Plan:  Acute bacterial rhinosinusitis Treat with Augmentin  875 mg twice daily for 10 days. "

## 2024-12-22 NOTE — Progress Notes (Signed)
 START ON PATHWAY REGIMEN - Non-Small Cell Lung     A cycle is every 7 days, concurrent with RT:     Paclitaxel      Carboplatin   **Always confirm dose/schedule in your pharmacy ordering system**  Patient Characteristics: Preoperative or Nonsurgical Candidate (Clinical Staging), Stage IIB (N2a only) or Stage III - Nonsurgical Candidate, PS = 0,1 Therapeutic Status: Preoperative or Nonsurgical Candidate (Clinical Staging) Check here if patient was staged using an edition other than AJCC Staging 9th Edition: false AJCC T Category: cTX AJCC N Category: cN3 AJCC M Category: cM0 AJCC 9 Stage Grouping: Unknown ECOG Performance Status: 0 Intent of Therapy: Curative Intent, Discussed with Patient

## 2024-12-23 ENCOUNTER — Other Ambulatory Visit: Payer: Self-pay

## 2024-12-23 MED ORDER — PROCHLORPERAZINE MALEATE 10 MG PO TABS: 10.0000 mg | ORAL_TABLET | Freq: Four times a day (QID) | ORAL | 1 refills | Status: AC | PRN

## 2024-12-23 MED ORDER — LIDOCAINE-PRILOCAINE 2.5-2.5 % EX CREA: TOPICAL_CREAM | CUTANEOUS | 3 refills | Status: AC

## 2024-12-23 MED ORDER — DEXAMETHASONE 4 MG PO TABS: ORAL_TABLET | ORAL | 1 refills | Status: AC

## 2024-12-23 MED ORDER — ONDANSETRON HCL 8 MG PO TABS: 8.0000 mg | ORAL_TABLET | Freq: Three times a day (TID) | ORAL | 1 refills | Status: DC | PRN

## 2024-12-25 ENCOUNTER — Encounter: Payer: Self-pay | Admitting: *Deleted

## 2024-12-26 ENCOUNTER — Inpatient Hospital Stay

## 2024-12-26 DIAGNOSIS — C3492 Malignant neoplasm of unspecified part of left bronchus or lung: Secondary | ICD-10-CM

## 2024-12-27 ENCOUNTER — Inpatient Hospital Stay

## 2024-12-27 DIAGNOSIS — C3492 Malignant neoplasm of unspecified part of left bronchus or lung: Secondary | ICD-10-CM

## 2024-12-27 DIAGNOSIS — C3490 Malignant neoplasm of unspecified part of unspecified bronchus or lung: Secondary | ICD-10-CM

## 2024-12-27 NOTE — Progress Notes (Signed)

## 2024-12-28 ENCOUNTER — Other Ambulatory Visit: Payer: Self-pay | Admitting: *Deleted

## 2024-12-28 ENCOUNTER — Inpatient Hospital Stay: Admitting: Licensed Clinical Social Worker

## 2024-12-28 ENCOUNTER — Other Ambulatory Visit: Payer: Self-pay | Admitting: Oncology

## 2024-12-28 DIAGNOSIS — C3492 Malignant neoplasm of unspecified part of left bronchus or lung: Secondary | ICD-10-CM

## 2024-12-28 NOTE — Progress Notes (Signed)
 [facility] Clinical Social Work  Initial Assessment   [name] is a 59 y.o. year old male contacted by phone. Clinical Social Work was referred by medical provider for assessment of psychosocial needs.   SDOH (Social Determinants of Health) assessments performed: Yes SDOH Interventions    Flowsheet Row Clinical Support from 03/09/2024 in [facility] Clinical Support from 10/14/2022 in [facility]  SDOH Interventions    Food Insecurity Interventions -- Intervention Not Indicated  Housing Interventions -- Intervention Not Indicated  Transportation Interventions -- Intervention Not Indicated  Utilities Interventions Intervention Not Indicated Intervention Not Indicated  Financial Strain Interventions -- Intervention Not Indicated  Physical Activity Interventions -- Intervention Not Indicated  Stress Interventions -- Intervention Not Indicated  Social Connections Interventions -- Intervention Not Indicated  Health Literacy Interventions Intervention Not Indicated --    SDOH Screenings   Food Insecurity: No Food Insecurity (12/14/2024)   Received from [facility]  Housing: Low Risk (11/29/2024)  Transportation Needs: No Transportation Needs (12/14/2024)   Received from [facility]  Utilities: Low Risk (12/14/2024)   Received from [facility]  Alcohol Screen: Medium Risk (06/24/2024)  Depression (PHQ2-9): Low Risk (12/15/2024)  Recent Concern: Depression (PHQ2-9) - Medium Risk (11/29/2024)  Financial Resource Strain: Low Risk (06/24/2024)  Physical Activity: Insufficiently Active (06/24/2024)  Social Connections: Moderately Isolated (06/24/2024)  Stress: Stress Concern Present (06/24/2024)  Tobacco Use: High Risk (12/22/2024)  Health Literacy: Adequate Health Literacy (03/09/2024)    PHQ 2/9:    12/15/2024    9:35 AM 11/29/2024   11:04 AM 06/28/2024   10:59 AM  Depression screen PHQ 2/9  Decreased Interest 0 3 0  Down,  Depressed, Hopeless 0 0 0  PHQ - 2 Score 0 3 0  Altered sleeping  0 0  Tired, decreased energy  3 1  Change in appetite  0 0  Feeling bad or failure about yourself   0 0  Trouble concentrating  0 0  Moving slowly or fidgety/restless  0 0  Suicidal thoughts  0 0  PHQ-9 Score  6 1      Data saved with a previous flowsheet row definition     Distress Screen completed: Yes    12/27/2024   11:22 AM  ONCBCN DISTRESS SCREENING  Screening Type Initial Screening  How much distress have you been experiencing in the past week? (0-10) 7  Practical concerns type Taking care of myself;Transportation;Treatment decisions  Emotional concerns type Worry or anxiety;Sadness or depression;Loss of interest or enjoyment;Feelings of worthlessness or being a burden  Physical Concerns Type  Pain;Sleep;Fatigue;Tobacco use;Changes in eating;Loss or change of physical abilities      Family/Social Information:  Housing Arrangement: patient lives with his wife who will be his primary source of support. Pt's wife is employed and will be press photographer for [employer]. Family members/support persons in your life? Pt reports limited support, but no concerns presently regarding treatment. Transportation concerns: no  Employment: Legally disabled pt has received disability since 2021 due to multiple surgeries.  Income source: Secretary/administrator concerns: No Type of concern: None Food access concerns: no Religious or spiritual practice: Not known Advanced directives: Not known Services Currently in place:  none  Coping/ Adjustment to diagnosis: Patient understands treatment plan and what happens next? yes Concerns about diagnosis and/or treatment: Overwhelmed by information, Afraid of cancer, and Quality of life Patient reported stressors: Anxiety/ nervousness and Adjusting to my illness Hopes  and/or priorities: pt's priority is to start treatment w/ the hope of a positive response. Patient enjoys not  addressed Current coping skills/ strengths: Capable of independent living , Motivation for treatment/growth , and Supportive family/friends     SUMMARY: Current SDOH Barriers:  No barriers identified at this time.  Clinical Social Work Clinical Goal(s):  No clinical social work goals at this time  Interventions: Discussed common feeling and emotions when being diagnosed with cancer, and the importance of support during treatment Informed patient of the support team roles and support services at [facility] Provided CSW contact information and encouraged patient to call with any questions or concerns Referred patient to community resources: Wadie Rung for supportive services.  Pt reports his prescription for lidocaine  was not sent to the pharmacy and wife reports difficulty reaching someone regarding her FMLA paperwork.  Message sent to RN who will f/u on both issues w/ pt.      Follow Up Plan: Patient will contact CSW with any support or resource needs Patient verbalizes understanding of plan: Yes    Devere JONELLE Manna, LCSW Clinical Social Worker [facility]

## 2024-12-29 ENCOUNTER — Inpatient Hospital Stay

## 2024-12-29 ENCOUNTER — Encounter: Payer: Self-pay | Admitting: *Deleted

## 2024-12-29 VITALS — BP 154/89 | HR 85 | Temp 98.3°F | Resp 18 | Wt 211.6 lb

## 2024-12-29 DIAGNOSIS — C3492 Malignant neoplasm of unspecified part of left bronchus or lung: Secondary | ICD-10-CM

## 2024-12-29 LAB — COMPREHENSIVE METABOLIC PANEL WITH GFR
ALT: 74 U/L — ABNORMAL HIGH (ref 0–44)
AST: 70 U/L — ABNORMAL HIGH (ref 15–41)
Albumin: 4.1 g/dL (ref 3.5–5.0)
Alkaline Phosphatase: 123 U/L (ref 38–126)
Anion gap: 11 (ref 5–15)
BUN: 10 mg/dL (ref 6–20)
CO2: 28 mmol/L (ref 22–32)
Calcium: 9.5 mg/dL (ref 8.9–10.3)
Chloride: 99 mmol/L (ref 98–111)
Creatinine, Ser: 0.74 mg/dL (ref 0.61–1.24)
GFR, Estimated: >60 mL/min
Glucose, Bld: 141 mg/dL — ABNORMAL HIGH (ref 70–99)
Potassium: 4.3 mmol/L (ref 3.5–5.1)
Sodium: 139 mmol/L (ref 135–145)
Total Bilirubin: 0.3 mg/dL (ref 0.0–1.2)
Total Protein: 7.2 g/dL (ref 6.5–8.1)

## 2024-12-29 LAB — CBC WITH DIFFERENTIAL/PLATELET
Abs Immature Granulocytes: 0.07 10*3/uL (ref 0.00–0.07)
Basophils Absolute: 0.1 10*3/uL (ref 0.0–0.1)
Basophils Relative: 1 %
Eosinophils Absolute: 0.3 10*3/uL (ref 0.0–0.5)
Eosinophils Relative: 2 %
HCT: 47.8 % (ref 39.0–52.0)
Hemoglobin: 16 g/dL (ref 13.0–17.0)
Immature Granulocytes: 1 %
Lymphocytes Relative: 13 %
Lymphs Abs: 1.7 10*3/uL (ref 0.7–4.0)
MCH: 32.8 pg (ref 26.0–34.0)
MCHC: 33.5 g/dL (ref 30.0–36.0)
MCV: 98 fL (ref 80.0–100.0)
Monocytes Absolute: 1.5 10*3/uL — ABNORMAL HIGH (ref 0.1–1.0)
Monocytes Relative: 11 %
Neutro Abs: 9.4 10*3/uL — ABNORMAL HIGH (ref 1.7–7.7)
Neutrophils Relative %: 72 %
Platelets: 247 10*3/uL (ref 150–400)
RBC: 4.88 MIL/uL (ref 4.22–5.81)
RDW: 12.8 % (ref 11.5–15.5)
WBC: 13 10*3/uL — ABNORMAL HIGH (ref 4.0–10.5)
nRBC: 0 % (ref 0.0–0.2)

## 2024-12-29 LAB — MAGNESIUM: Magnesium: 2.1 mg/dL (ref 1.7–2.4)

## 2024-12-29 MED ORDER — DIPHENHYDRAMINE HCL 50 MG/ML IJ SOLN
50.0000 mg | Freq: Once | INTRAMUSCULAR | Status: AC
  Administered 2024-12-29: 50 mg via INTRAVENOUS
  Filled 2024-12-29: qty 1

## 2024-12-29 MED ORDER — SODIUM CHLORIDE 0.9 % IV SOLN
300.0000 mg | Freq: Once | INTRAVENOUS | Status: AC
  Administered 2024-12-29: 300 mg via INTRAVENOUS
  Filled 2024-12-29: qty 30

## 2024-12-29 MED ORDER — FAMOTIDINE IN NACL 20-0.9 MG/50ML-% IV SOLN
20.0000 mg | Freq: Once | INTRAVENOUS | Status: AC
  Administered 2024-12-29: 20 mg via INTRAVENOUS
  Filled 2024-12-29: qty 50

## 2024-12-29 MED ORDER — DEXAMETHASONE SOD PHOSPHATE PF 10 MG/ML IJ SOLN
10.0000 mg | Freq: Once | INTRAMUSCULAR | Status: AC
  Administered 2024-12-29: 10 mg via INTRAVENOUS
  Filled 2024-12-29: qty 1

## 2024-12-29 MED ORDER — SODIUM CHLORIDE 0.9 % IV SOLN
45.0000 mg/m2 | Freq: Once | INTRAVENOUS | Status: AC
  Administered 2024-12-29: 96 mg via INTRAVENOUS
  Filled 2024-12-29: qty 16

## 2024-12-29 MED ORDER — SODIUM CHLORIDE 0.9 % IV SOLN: INTRAVENOUS | Status: DC

## 2024-12-29 MED ORDER — PALONOSETRON HCL INJECTION 0.25 MG/5ML
0.2500 mg | Freq: Once | INTRAVENOUS | Status: AC
  Administered 2024-12-29: 0.25 mg via INTRAVENOUS
  Filled 2024-12-29: qty 5

## 2024-12-29 NOTE — Progress Notes (Signed)
 Pharmacist Chemotherapy Monitoring - Initial Assessment    Anticipated start date: 12/29/2024   The following has been reviewed per standard work regarding the patient's treatment regimen: The patient's diagnosis, treatment plan and drug doses, and organ/hematologic function Lab orders and baseline tests specific to treatment regimen  The treatment plan start date, drug sequencing, and pre-medications Prior authorization status  Patient's documented medication list, including drug-drug interaction screen and prescriptions for anti-emetics and supportive care specific to the treatment regimen The drug concentrations, fluid compatibility, administration routes, and timing of the medications to be used The patient's access for treatment and lifetime cumulative dose history, if applicable  The patient's medication allergies and previous infusion related reactions, if applicable   Changes made to treatment plan:  N/A  Follow up needed:  N/A   Added Magnesium  to plan labs  [name], [facility], 12/29/2024  9:50 AM

## 2024-12-29 NOTE — Progress Notes (Addendum)
 PA completed for Ondansetron  8 mg tablets.  Expedited review requested and was approved by plan from 12/29/2024 - 11/23/2025.  Patient and pharmacy aware.

## 2024-12-29 NOTE — Patient Instructions (Signed)
 [facility] [place] - A DEPT OF [facility]  Discharge Instructions: Thank you for choosing [facility] to provide your oncology and hematology care.  If you have a lab appointment with the Cancer Center - please note that after April 8th, 2024, all labs will be drawn in the cancer center.  You do not have to check in or register with the main entrance as you have in the past but will complete your check-in in the cancer center.  Wear comfortable clothing and clothing appropriate for easy access to any Portacath or PICC line.   We strive to give you quality time with your provider. You may need to reschedule your appointment if you arrive late (15 or more minutes).  Arriving late affects you and other patients whose appointments are after yours.  Also, if you miss three or more appointments without notifying the office, you may be dismissed from the clinic at the provider's discretion.      For prescription refill requests, have your pharmacy contact our office and allow 72 hours for refills to be completed.    Today you received the following chemotherapy and/or immunotherapy agents Taxol/Carboplatin      To help prevent nausea and vomiting after your treatment, we encourage you to take your nausea medication as directed.  BELOW ARE SYMPTOMS THAT SHOULD BE REPORTED IMMEDIATELY: *FEVER GREATER THAN 100.4 F (38 C) OR HIGHER *CHILLS OR SWEATING *NAUSEA AND VOMITING THAT IS NOT CONTROLLED WITH YOUR NAUSEA MEDICATION *UNUSUAL SHORTNESS OF BREATH *UNUSUAL BRUISING OR BLEEDING *URINARY PROBLEMS (pain or burning when urinating, or frequent urination) *BOWEL PROBLEMS (unusual diarrhea, constipation, pain near the anus) TENDERNESS IN MOUTH AND THROAT WITH OR WITHOUT PRESENCE OF ULCERS (sore throat, sores in mouth, or a toothache) UNUSUAL RASH, SWELLING OR PAIN  UNUSUAL VAGINAL DISCHARGE OR ITCHING   Items with * indicate a potential emergency and should be  followed up as soon as possible or go to the Emergency Department if any problems should occur.  Please show the CHEMOTHERAPY ALERT CARD or IMMUNOTHERAPY ALERT CARD at check-in to the Emergency Department and triage nurse.  Should you have questions after your visit or need to cancel or reschedule your appointment, please contact [facility] [place] - A DEPT OF [facility]igh[facility] Brideg[facility] [phone]  and follow the prompts.  Office hours are 8:00 a.m. to 4:30 p.m. Monday - Friday. Please note that voicemails left after 4:00 p.m. may not be returned until the following business day.  We are closed weekends and major holidays. You have access to a nurse at all times for urgent questions. Please call the main number to the clinic [phone] and follow the prompts.  For any non-urgent questions, you may also contact your provider using MyChart. We now offer e-Visits for anyone 45 and older to request care online for non-urgent symptoms. For details visit [email or web address].   Also download the MyChart app! Go to the app store, search "MyChart", open the app, select [place], and log in with your MyChart username and password.

## 2024-12-29 NOTE — Progress Notes (Signed)
 Patient presents today for D1C1 Taxol /Carboplatin  infusion per providers order.  Vital signs and labs within parameters for treatment.   Treatment given today per MD orders.  Stable during infusion without adverse affects.  Vital signs stable.  No complaints at this time.  Discharge from clinic ambulatory in stable condition.  Alert and oriented X 3.  Follow up with [facility] as scheduled.

## 2024-12-30 ENCOUNTER — Other Ambulatory Visit: Payer: Self-pay | Admitting: Oncology

## 2024-12-30 ENCOUNTER — Telehealth: Payer: Self-pay

## 2024-12-30 DIAGNOSIS — C3492 Malignant neoplasm of unspecified part of left bronchus or lung: Secondary | ICD-10-CM

## 2024-12-30 NOTE — Telephone Encounter (Signed)
 Patient called for 24-hour call back, patient states he feels pretty good, patient made aware to call cancer center if needed.

## 2025-01-02 ENCOUNTER — Inpatient Hospital Stay: Admitting: Dietician

## 2025-01-04 ENCOUNTER — Inpatient Hospital Stay

## 2025-01-04 ENCOUNTER — Inpatient Hospital Stay: Admitting: Oncology

## 2025-01-04 ENCOUNTER — Ambulatory Visit: Admitting: Pulmonary Disease

## 2025-01-11 ENCOUNTER — Inpatient Hospital Stay: Admitting: Oncology

## 2025-01-11 ENCOUNTER — Inpatient Hospital Stay

## 2025-01-18 ENCOUNTER — Inpatient Hospital Stay: Admitting: Oncology

## 2025-01-18 ENCOUNTER — Inpatient Hospital Stay

## 2025-01-25 ENCOUNTER — Inpatient Hospital Stay

## 2025-01-26 ENCOUNTER — Inpatient Hospital Stay: Admitting: Oncology

## 2025-01-26 ENCOUNTER — Inpatient Hospital Stay

## 2025-02-01 ENCOUNTER — Inpatient Hospital Stay

## 2025-02-01 ENCOUNTER — Inpatient Hospital Stay: Admitting: Oncology

## 2025-06-30 ENCOUNTER — Encounter: Admitting: Family Medicine
